# Patient Record
Sex: Female | Born: 1950 | Race: White | Hispanic: No | Marital: Married | Smoking: Never smoker
Health system: Southern US, Community
[De-identification: ages and names within clinical notes are randomized; demographics above are authoritative.]

## PROBLEM LIST (undated history)

## (undated) DIAGNOSIS — M1990 Unspecified osteoarthritis, unspecified site: Secondary | ICD-10-CM

## (undated) DIAGNOSIS — E079 Disorder of thyroid, unspecified: Secondary | ICD-10-CM

## (undated) DIAGNOSIS — L409 Psoriasis, unspecified: Secondary | ICD-10-CM

## (undated) DIAGNOSIS — Z5189 Encounter for other specified aftercare: Secondary | ICD-10-CM

## (undated) DIAGNOSIS — K589 Irritable bowel syndrome without diarrhea: Secondary | ICD-10-CM

## (undated) DIAGNOSIS — IMO0002 Reserved for concepts with insufficient information to code with codable children: Secondary | ICD-10-CM

## (undated) DIAGNOSIS — Q249 Congenital malformation of heart, unspecified: Secondary | ICD-10-CM

## (undated) DIAGNOSIS — M8580 Other specified disorders of bone density and structure, unspecified site: Secondary | ICD-10-CM

## (undated) DIAGNOSIS — K5792 Diverticulitis of intestine, part unspecified, without perforation or abscess without bleeding: Secondary | ICD-10-CM

## (undated) DIAGNOSIS — D219 Benign neoplasm of connective and other soft tissue, unspecified: Secondary | ICD-10-CM

## (undated) DIAGNOSIS — G4733 Obstructive sleep apnea (adult) (pediatric): Secondary | ICD-10-CM

## (undated) DIAGNOSIS — L900 Lichen sclerosus et atrophicus: Secondary | ICD-10-CM

## (undated) DIAGNOSIS — G4730 Sleep apnea, unspecified: Secondary | ICD-10-CM

## (undated) DIAGNOSIS — I10 Essential (primary) hypertension: Secondary | ICD-10-CM

## (undated) DIAGNOSIS — E11319 Type 2 diabetes mellitus with unspecified diabetic retinopathy without macular edema: Secondary | ICD-10-CM

## (undated) DIAGNOSIS — F419 Anxiety disorder, unspecified: Secondary | ICD-10-CM

## (undated) DIAGNOSIS — A64 Unspecified sexually transmitted disease: Secondary | ICD-10-CM

## (undated) DIAGNOSIS — K219 Gastro-esophageal reflux disease without esophagitis: Secondary | ICD-10-CM

## (undated) DIAGNOSIS — I519 Heart disease, unspecified: Secondary | ICD-10-CM

## (undated) DIAGNOSIS — T7840XA Allergy, unspecified, initial encounter: Secondary | ICD-10-CM

## (undated) DIAGNOSIS — F32A Depression, unspecified: Secondary | ICD-10-CM

## (undated) DIAGNOSIS — E785 Hyperlipidemia, unspecified: Secondary | ICD-10-CM

## (undated) DIAGNOSIS — F329 Major depressive disorder, single episode, unspecified: Secondary | ICD-10-CM

## (undated) HISTORY — DX: Congenital malformation of heart, unspecified: Q24.9

## (undated) HISTORY — PX: OOPHORECTOMY: SHX86

## (undated) HISTORY — DX: Obstructive sleep apnea (adult) (pediatric): G47.33

## (undated) HISTORY — DX: Irritable bowel syndrome, unspecified: K58.9

## (undated) HISTORY — PX: PARTIAL COLECTOMY: SHX5273

## (undated) HISTORY — DX: Benign neoplasm of connective and other soft tissue, unspecified: D21.9

## (undated) HISTORY — DX: Anxiety disorder, unspecified: F41.9

## (undated) HISTORY — DX: Allergy, unspecified, initial encounter: T78.40XA

## (undated) HISTORY — PX: ROTATOR CUFF REPAIR: SHX139

## (undated) HISTORY — DX: Major depressive disorder, single episode, unspecified: F32.9

## (undated) HISTORY — PX: TOTAL KNEE ARTHROPLASTY: SHX125

## (undated) HISTORY — DX: Hyperlipidemia, unspecified: E78.5

## (undated) HISTORY — DX: Psoriasis, unspecified: L40.9

## (undated) HISTORY — PX: FOOT SURGERY: SHX648

## (undated) HISTORY — DX: Other specified disorders of bone density and structure, unspecified site: M85.80

## (undated) HISTORY — DX: Disorder of thyroid, unspecified: E07.9

## (undated) HISTORY — PX: NASAL SINUS SURGERY: SHX719

## (undated) HISTORY — DX: Heart disease, unspecified: I51.9

## (undated) HISTORY — DX: Reserved for concepts with insufficient information to code with codable children: IMO0002

## (undated) HISTORY — DX: Unspecified sexually transmitted disease: A64

## (undated) HISTORY — DX: Type 2 diabetes mellitus with unspecified diabetic retinopathy without macular edema: E11.319

## (undated) HISTORY — DX: Lichen sclerosus et atrophicus: L90.0

## (undated) HISTORY — DX: Sleep apnea, unspecified: G47.30

## (undated) HISTORY — DX: Essential (primary) hypertension: I10

## (undated) HISTORY — DX: Unspecified osteoarthritis, unspecified site: M19.90

## (undated) HISTORY — DX: Depression, unspecified: F32.A

## (undated) HISTORY — DX: Diverticulitis of intestine, part unspecified, without perforation or abscess without bleeding: K57.92

## (undated) HISTORY — DX: Gastro-esophageal reflux disease without esophagitis: K21.9

## (undated) HISTORY — DX: Encounter for other specified aftercare: Z51.89

---

## 1985-10-17 ENCOUNTER — Encounter (INDEPENDENT_AMBULATORY_CARE_PROVIDER_SITE_OTHER): Payer: Self-pay | Admitting: Gastroenterology

## 1993-12-31 HISTORY — PX: ABDOMINAL HYSTERECTOMY: SHX81

## 1999-02-05 ENCOUNTER — Ambulatory Visit (HOSPITAL_COMMUNITY): Admission: RE | Admit: 1999-02-05 | Discharge: 1999-02-05 | Payer: Self-pay | Admitting: Neonatology

## 1999-08-28 ENCOUNTER — Other Ambulatory Visit: Admission: RE | Admit: 1999-08-28 | Discharge: 1999-08-28 | Payer: Self-pay | Admitting: Gynecology

## 1999-09-28 ENCOUNTER — Ambulatory Visit (HOSPITAL_BASED_OUTPATIENT_CLINIC_OR_DEPARTMENT_OTHER): Admission: RE | Admit: 1999-09-28 | Discharge: 1999-09-28 | Payer: Self-pay | Admitting: Orthopedic Surgery

## 1999-11-01 ENCOUNTER — Ambulatory Visit (HOSPITAL_BASED_OUTPATIENT_CLINIC_OR_DEPARTMENT_OTHER): Admission: RE | Admit: 1999-11-01 | Discharge: 1999-11-01 | Payer: Self-pay | Admitting: Orthopedic Surgery

## 1999-11-08 ENCOUNTER — Ambulatory Visit (HOSPITAL_COMMUNITY): Admission: RE | Admit: 1999-11-08 | Discharge: 1999-11-08 | Payer: Self-pay | Admitting: Family Medicine

## 1999-12-01 ENCOUNTER — Ambulatory Visit (HOSPITAL_COMMUNITY): Admission: RE | Admit: 1999-12-01 | Discharge: 1999-12-01 | Payer: Self-pay | Admitting: Orthopedic Surgery

## 2000-08-29 ENCOUNTER — Other Ambulatory Visit: Admission: RE | Admit: 2000-08-29 | Discharge: 2000-08-29 | Payer: Self-pay | Admitting: Gynecology

## 2000-12-31 DIAGNOSIS — Z5189 Encounter for other specified aftercare: Secondary | ICD-10-CM

## 2000-12-31 HISTORY — DX: Encounter for other specified aftercare: Z51.89

## 2001-03-21 ENCOUNTER — Encounter: Payer: Self-pay | Admitting: Internal Medicine

## 2001-04-21 ENCOUNTER — Other Ambulatory Visit (HOSPITAL_COMMUNITY): Admission: RE | Admit: 2001-04-21 | Discharge: 2001-04-30 | Payer: Self-pay | Admitting: Psychiatry

## 2001-04-30 ENCOUNTER — Inpatient Hospital Stay (HOSPITAL_COMMUNITY): Admission: EM | Admit: 2001-04-30 | Discharge: 2001-05-01 | Payer: Self-pay | Admitting: *Deleted

## 2001-08-06 ENCOUNTER — Ambulatory Visit (HOSPITAL_COMMUNITY): Admission: RE | Admit: 2001-08-06 | Discharge: 2001-08-06 | Payer: Self-pay | Admitting: Psychiatry

## 2001-08-19 ENCOUNTER — Encounter: Payer: Self-pay | Admitting: Orthopedic Surgery

## 2001-08-25 ENCOUNTER — Inpatient Hospital Stay (HOSPITAL_COMMUNITY): Admission: RE | Admit: 2001-08-25 | Discharge: 2001-08-30 | Payer: Self-pay | Admitting: Orthopedic Surgery

## 2001-08-27 ENCOUNTER — Encounter: Payer: Self-pay | Admitting: Orthopedic Surgery

## 2001-10-24 ENCOUNTER — Encounter: Admission: RE | Admit: 2001-10-24 | Discharge: 2001-10-24 | Payer: Self-pay | Admitting: Psychiatry

## 2001-11-14 ENCOUNTER — Encounter: Admission: RE | Admit: 2001-11-14 | Discharge: 2001-11-14 | Payer: Self-pay | Admitting: Psychiatry

## 2001-11-25 ENCOUNTER — Encounter: Admission: RE | Admit: 2001-11-25 | Discharge: 2001-11-25 | Payer: Self-pay | Admitting: Psychiatry

## 2001-12-12 ENCOUNTER — Encounter: Admission: RE | Admit: 2001-12-12 | Discharge: 2001-12-12 | Payer: Self-pay | Admitting: Psychiatry

## 2001-12-18 ENCOUNTER — Encounter: Admission: RE | Admit: 2001-12-18 | Discharge: 2001-12-18 | Payer: Self-pay | Admitting: Psychiatry

## 2002-01-02 ENCOUNTER — Encounter: Admission: RE | Admit: 2002-01-02 | Discharge: 2002-01-02 | Payer: Self-pay | Admitting: Psychiatry

## 2002-01-15 ENCOUNTER — Encounter: Admission: RE | Admit: 2002-01-15 | Discharge: 2002-01-15 | Payer: Self-pay | Admitting: Psychiatry

## 2002-01-30 ENCOUNTER — Encounter: Admission: RE | Admit: 2002-01-30 | Discharge: 2002-01-30 | Payer: Self-pay | Admitting: Psychiatry

## 2002-02-09 ENCOUNTER — Encounter: Admission: RE | Admit: 2002-02-09 | Discharge: 2002-02-09 | Payer: Self-pay | Admitting: Psychiatry

## 2002-02-12 ENCOUNTER — Ambulatory Visit (HOSPITAL_BASED_OUTPATIENT_CLINIC_OR_DEPARTMENT_OTHER): Admission: RE | Admit: 2002-02-12 | Discharge: 2002-02-12 | Payer: Self-pay | Admitting: Orthopedic Surgery

## 2002-03-30 ENCOUNTER — Encounter: Payer: Self-pay | Admitting: Internal Medicine

## 2002-04-03 ENCOUNTER — Encounter: Payer: Self-pay | Admitting: Internal Medicine

## 2002-04-07 ENCOUNTER — Encounter: Admission: RE | Admit: 2002-04-07 | Discharge: 2002-04-07 | Payer: Self-pay | Admitting: Psychiatry

## 2002-04-14 ENCOUNTER — Encounter: Admission: RE | Admit: 2002-04-14 | Discharge: 2002-04-14 | Payer: Self-pay | Admitting: Psychiatry

## 2002-04-24 ENCOUNTER — Encounter: Admission: RE | Admit: 2002-04-24 | Discharge: 2002-04-24 | Payer: Self-pay | Admitting: Psychiatry

## 2002-04-30 ENCOUNTER — Encounter: Admission: RE | Admit: 2002-04-30 | Discharge: 2002-04-30 | Payer: Self-pay | Admitting: Psychiatry

## 2002-05-04 ENCOUNTER — Other Ambulatory Visit: Admission: RE | Admit: 2002-05-04 | Discharge: 2002-05-04 | Payer: Self-pay | Admitting: Gynecology

## 2002-05-05 ENCOUNTER — Encounter: Payer: Self-pay | Admitting: Gynecology

## 2002-05-05 ENCOUNTER — Encounter: Admission: RE | Admit: 2002-05-05 | Discharge: 2002-05-05 | Payer: Self-pay | Admitting: Gynecology

## 2002-05-07 ENCOUNTER — Encounter: Admission: RE | Admit: 2002-05-07 | Discharge: 2002-05-07 | Payer: Self-pay | Admitting: Psychiatry

## 2002-05-08 ENCOUNTER — Ambulatory Visit (HOSPITAL_COMMUNITY): Admission: RE | Admit: 2002-05-08 | Discharge: 2002-05-08 | Payer: Self-pay | Admitting: Orthopedic Surgery

## 2002-05-08 ENCOUNTER — Encounter: Payer: Self-pay | Admitting: Orthopedic Surgery

## 2002-05-14 ENCOUNTER — Encounter: Admission: RE | Admit: 2002-05-14 | Discharge: 2002-05-14 | Payer: Self-pay | Admitting: Psychiatry

## 2002-05-21 ENCOUNTER — Encounter: Admission: RE | Admit: 2002-05-21 | Discharge: 2002-05-21 | Payer: Self-pay | Admitting: Psychiatry

## 2002-05-28 ENCOUNTER — Encounter: Admission: RE | Admit: 2002-05-28 | Discharge: 2002-05-28 | Payer: Self-pay | Admitting: Psychiatry

## 2002-06-11 ENCOUNTER — Encounter: Admission: RE | Admit: 2002-06-11 | Discharge: 2002-06-11 | Payer: Self-pay | Admitting: Psychiatry

## 2002-06-18 ENCOUNTER — Encounter: Admission: RE | Admit: 2002-06-18 | Discharge: 2002-06-18 | Payer: Self-pay | Admitting: Psychiatry

## 2002-07-02 ENCOUNTER — Encounter: Admission: RE | Admit: 2002-07-02 | Discharge: 2002-07-02 | Payer: Self-pay | Admitting: Psychiatry

## 2002-07-16 ENCOUNTER — Encounter: Admission: RE | Admit: 2002-07-16 | Discharge: 2002-07-16 | Payer: Self-pay | Admitting: Psychiatry

## 2002-07-23 ENCOUNTER — Encounter: Admission: RE | Admit: 2002-07-23 | Discharge: 2002-07-23 | Payer: Self-pay | Admitting: Psychiatry

## 2002-07-30 ENCOUNTER — Encounter: Admission: RE | Admit: 2002-07-30 | Discharge: 2002-07-30 | Payer: Self-pay | Admitting: Psychiatry

## 2002-08-13 ENCOUNTER — Encounter: Admission: RE | Admit: 2002-08-13 | Discharge: 2002-08-13 | Payer: Self-pay | Admitting: Psychiatry

## 2002-09-28 ENCOUNTER — Encounter: Admission: RE | Admit: 2002-09-28 | Discharge: 2002-09-28 | Payer: Self-pay | Admitting: Psychiatry

## 2002-10-12 ENCOUNTER — Encounter: Admission: RE | Admit: 2002-10-12 | Discharge: 2002-10-12 | Payer: Self-pay | Admitting: Psychiatry

## 2002-11-06 ENCOUNTER — Encounter: Admission: RE | Admit: 2002-11-06 | Discharge: 2002-11-06 | Payer: Self-pay | Admitting: Psychiatry

## 2002-11-18 ENCOUNTER — Encounter: Admission: RE | Admit: 2002-11-18 | Discharge: 2002-11-18 | Payer: Self-pay | Admitting: Psychiatry

## 2003-04-02 ENCOUNTER — Encounter: Admission: RE | Admit: 2003-04-02 | Discharge: 2003-04-02 | Payer: Self-pay | Admitting: Psychiatry

## 2003-04-07 ENCOUNTER — Encounter: Admission: RE | Admit: 2003-04-07 | Discharge: 2003-04-07 | Payer: Self-pay | Admitting: Psychiatry

## 2003-04-14 ENCOUNTER — Encounter: Admission: RE | Admit: 2003-04-14 | Discharge: 2003-04-14 | Payer: Self-pay | Admitting: Psychiatry

## 2003-04-20 ENCOUNTER — Encounter: Admission: RE | Admit: 2003-04-20 | Discharge: 2003-04-20 | Payer: Self-pay | Admitting: Psychiatry

## 2003-04-30 ENCOUNTER — Encounter: Admission: RE | Admit: 2003-04-30 | Discharge: 2003-04-30 | Payer: Self-pay | Admitting: Psychiatry

## 2003-05-12 ENCOUNTER — Other Ambulatory Visit: Admission: RE | Admit: 2003-05-12 | Discharge: 2003-05-12 | Payer: Self-pay | Admitting: Gynecology

## 2003-05-17 ENCOUNTER — Encounter: Admission: RE | Admit: 2003-05-17 | Discharge: 2003-05-17 | Payer: Self-pay | Admitting: Psychiatry

## 2003-05-20 ENCOUNTER — Encounter: Admission: RE | Admit: 2003-05-20 | Discharge: 2003-08-18 | Payer: Self-pay | Admitting: Internal Medicine

## 2003-05-26 ENCOUNTER — Encounter: Payer: Self-pay | Admitting: Orthopedic Surgery

## 2003-05-28 ENCOUNTER — Encounter: Payer: Self-pay | Admitting: Orthopedic Surgery

## 2003-05-28 ENCOUNTER — Inpatient Hospital Stay (HOSPITAL_COMMUNITY): Admission: RE | Admit: 2003-05-28 | Discharge: 2003-06-01 | Payer: Self-pay | Admitting: Orthopedic Surgery

## 2003-08-19 ENCOUNTER — Encounter: Admission: RE | Admit: 2003-08-19 | Discharge: 2003-08-19 | Payer: Self-pay | Admitting: Psychiatry

## 2003-08-24 ENCOUNTER — Encounter: Admission: RE | Admit: 2003-08-24 | Discharge: 2003-11-22 | Payer: Self-pay | Admitting: Internal Medicine

## 2004-04-14 ENCOUNTER — Encounter: Admission: RE | Admit: 2004-04-14 | Discharge: 2004-04-14 | Payer: Self-pay | Admitting: Psychiatry

## 2004-05-16 ENCOUNTER — Other Ambulatory Visit (HOSPITAL_COMMUNITY): Admission: RE | Admit: 2004-05-16 | Discharge: 2004-05-18 | Payer: Self-pay | Admitting: Psychiatry

## 2004-05-19 ENCOUNTER — Encounter: Admission: RE | Admit: 2004-05-19 | Discharge: 2004-05-19 | Payer: Self-pay | Admitting: Gastroenterology

## 2004-10-04 ENCOUNTER — Other Ambulatory Visit: Admission: RE | Admit: 2004-10-04 | Discharge: 2004-10-04 | Payer: Self-pay | Admitting: Gynecology

## 2004-11-16 ENCOUNTER — Ambulatory Visit (HOSPITAL_COMMUNITY): Payer: Self-pay | Admitting: Professional Counselor

## 2004-11-30 ENCOUNTER — Ambulatory Visit (HOSPITAL_COMMUNITY): Payer: Self-pay | Admitting: Professional Counselor

## 2005-04-10 ENCOUNTER — Ambulatory Visit: Payer: Self-pay | Admitting: Internal Medicine

## 2005-11-08 ENCOUNTER — Encounter: Admission: RE | Admit: 2005-11-08 | Discharge: 2005-11-08 | Payer: Self-pay | Admitting: Internal Medicine

## 2005-11-09 ENCOUNTER — Ambulatory Visit: Payer: Self-pay | Admitting: Internal Medicine

## 2005-11-12 ENCOUNTER — Other Ambulatory Visit: Admission: RE | Admit: 2005-11-12 | Discharge: 2005-11-12 | Payer: Self-pay | Admitting: Gynecology

## 2005-11-19 ENCOUNTER — Ambulatory Visit: Payer: Self-pay | Admitting: Internal Medicine

## 2005-11-20 ENCOUNTER — Encounter: Admission: RE | Admit: 2005-11-20 | Discharge: 2005-12-30 | Payer: Self-pay | Admitting: Internal Medicine

## 2005-11-29 ENCOUNTER — Encounter (INDEPENDENT_AMBULATORY_CARE_PROVIDER_SITE_OTHER): Payer: Self-pay | Admitting: Specialist

## 2005-11-29 ENCOUNTER — Encounter: Payer: Self-pay | Admitting: Internal Medicine

## 2005-11-29 ENCOUNTER — Ambulatory Visit: Payer: Self-pay | Admitting: Internal Medicine

## 2005-11-29 DIAGNOSIS — D126 Benign neoplasm of colon, unspecified: Secondary | ICD-10-CM | POA: Insufficient documentation

## 2005-12-21 ENCOUNTER — Ambulatory Visit: Payer: Self-pay | Admitting: Internal Medicine

## 2006-02-07 ENCOUNTER — Ambulatory Visit: Payer: Self-pay | Admitting: Internal Medicine

## 2006-02-25 ENCOUNTER — Ambulatory Visit: Payer: Self-pay | Admitting: Internal Medicine

## 2006-08-28 ENCOUNTER — Ambulatory Visit: Payer: Self-pay | Admitting: Internal Medicine

## 2006-09-12 ENCOUNTER — Ambulatory Visit: Payer: Self-pay | Admitting: Internal Medicine

## 2006-09-18 ENCOUNTER — Ambulatory Visit: Payer: Self-pay | Admitting: Internal Medicine

## 2006-10-16 ENCOUNTER — Ambulatory Visit: Payer: Self-pay | Admitting: *Deleted

## 2006-10-16 ENCOUNTER — Observation Stay (HOSPITAL_COMMUNITY): Admission: EM | Admit: 2006-10-16 | Discharge: 2006-10-17 | Payer: Self-pay | Admitting: Emergency Medicine

## 2006-10-31 ENCOUNTER — Ambulatory Visit: Payer: Self-pay | Admitting: Internal Medicine

## 2006-10-31 DIAGNOSIS — E785 Hyperlipidemia, unspecified: Secondary | ICD-10-CM | POA: Insufficient documentation

## 2006-10-31 DIAGNOSIS — F411 Generalized anxiety disorder: Secondary | ICD-10-CM | POA: Insufficient documentation

## 2006-10-31 DIAGNOSIS — F329 Major depressive disorder, single episode, unspecified: Secondary | ICD-10-CM

## 2006-10-31 DIAGNOSIS — I10 Essential (primary) hypertension: Secondary | ICD-10-CM | POA: Insufficient documentation

## 2006-10-31 DIAGNOSIS — J309 Allergic rhinitis, unspecified: Secondary | ICD-10-CM | POA: Insufficient documentation

## 2006-10-31 DIAGNOSIS — F32A Depression, unspecified: Secondary | ICD-10-CM | POA: Insufficient documentation

## 2006-10-31 DIAGNOSIS — K5730 Diverticulosis of large intestine without perforation or abscess without bleeding: Secondary | ICD-10-CM | POA: Insufficient documentation

## 2006-10-31 DIAGNOSIS — J45909 Unspecified asthma, uncomplicated: Secondary | ICD-10-CM | POA: Insufficient documentation

## 2006-11-14 ENCOUNTER — Ambulatory Visit: Payer: Self-pay | Admitting: Pulmonary Disease

## 2006-12-10 ENCOUNTER — Ambulatory Visit (HOSPITAL_BASED_OUTPATIENT_CLINIC_OR_DEPARTMENT_OTHER): Admission: RE | Admit: 2006-12-10 | Discharge: 2006-12-10 | Payer: Self-pay | Admitting: Pulmonary Disease

## 2006-12-10 ENCOUNTER — Ambulatory Visit: Payer: Self-pay | Admitting: Pulmonary Disease

## 2006-12-13 ENCOUNTER — Ambulatory Visit: Payer: Self-pay | Admitting: Pulmonary Disease

## 2006-12-16 ENCOUNTER — Ambulatory Visit: Payer: Self-pay | Admitting: Pulmonary Disease

## 2007-01-08 ENCOUNTER — Other Ambulatory Visit: Admission: RE | Admit: 2007-01-08 | Discharge: 2007-01-08 | Payer: Self-pay | Admitting: Gynecology

## 2007-01-24 ENCOUNTER — Ambulatory Visit: Payer: Self-pay | Admitting: Pulmonary Disease

## 2007-08-26 DIAGNOSIS — M1990 Unspecified osteoarthritis, unspecified site: Secondary | ICD-10-CM | POA: Insufficient documentation

## 2007-08-26 DIAGNOSIS — E039 Hypothyroidism, unspecified: Secondary | ICD-10-CM | POA: Insufficient documentation

## 2007-09-12 ENCOUNTER — Ambulatory Visit: Payer: Self-pay | Admitting: Internal Medicine

## 2007-09-12 LAB — CONVERTED CEMR LAB
ALT: 28 units/L (ref 0–35)
AST: 29 units/L (ref 0–37)
Albumin: 4.4 g/dL (ref 3.5–5.2)
Alkaline Phosphatase: 89 units/L (ref 39–117)
BUN: 20 mg/dL (ref 6–23)
Basophils Absolute: 0.1 10*3/uL (ref 0.0–0.1)
Basophils Relative: 0.5 % (ref 0.0–1.0)
Bilirubin Urine: NEGATIVE
Bilirubin, Direct: 0.2 mg/dL (ref 0.0–0.3)
Blood in Urine, dipstick: NEGATIVE
CO2: 30 meq/L (ref 19–32)
Calcium: 9.4 mg/dL (ref 8.4–10.5)
Chloride: 104 meq/L (ref 96–112)
Cholesterol: 174 mg/dL (ref 0–200)
Creatinine, Ser: 1.2 mg/dL (ref 0.4–1.2)
Creatinine,U: 118.1 mg/dL
Direct LDL: 107.9 mg/dL
Eosinophils Absolute: 0.4 10*3/uL (ref 0.0–0.6)
Eosinophils Relative: 4 % (ref 0.0–5.0)
GFR calc Af Amer: 60 mL/min
GFR calc non Af Amer: 50 mL/min
Glucose, Bld: 123 mg/dL — ABNORMAL HIGH (ref 70–99)
Glucose, Urine, Semiquant: NEGATIVE
HCT: 38.2 % (ref 36.0–46.0)
HDL: 41.9 mg/dL (ref >39.0)
Hemoglobin: 13.1 g/dL (ref 12.0–15.0)
Hgb A1c MFr Bld: 6.9 % — ABNORMAL HIGH (ref 4.6–6.0)
Ketones, urine, test strip: NEGATIVE
Lymphocytes Relative: 40 % (ref 12.0–46.0)
MCHC: 34.4 g/dL (ref 30.0–36.0)
MCV: 90.1 fL (ref 78.0–100.0)
Microalb Creat Ratio: 6.8 mg/g (ref 0.0–30.0)
Microalb, Ur: 0.8 mg/dL (ref 0.0–1.9)
Monocytes Absolute: 0.8 10*3/uL — ABNORMAL HIGH (ref 0.2–0.7)
Monocytes Relative: 7.9 % (ref 3.0–11.0)
Neutro Abs: 4.9 10*3/uL (ref 1.4–7.7)
Neutrophils Relative %: 47.6 % (ref 43.0–77.0)
Nitrite: POSITIVE
Platelets: 319 10*3/uL (ref 150–400)
Potassium: 5.5 meq/L — ABNORMAL HIGH (ref 3.5–5.1)
Protein, U semiquant: NEGATIVE
RBC: 4.24 M/uL (ref 3.87–5.11)
RDW: 14.1 % (ref 11.5–14.6)
Sodium: 143 meq/L (ref 135–145)
Specific Gravity, Urine: 1.02
TSH: 4.32 microintl units/mL (ref 0.35–5.50)
Total Bilirubin: 0.4 mg/dL (ref 0.3–1.2)
Total CHOL/HDL Ratio: 4.2
Total Protein: 6.7 g/dL (ref 6.0–8.3)
Triglycerides: 209 mg/dL (ref 0–149)
Urobilinogen, UA: 0.2
VLDL: 42 mg/dL — ABNORMAL HIGH (ref 0–40)
WBC Urine, dipstick: NEGATIVE
WBC: 10.4 10*3/uL (ref 4.5–10.5)
pH: 5

## 2007-09-19 ENCOUNTER — Ambulatory Visit: Payer: Self-pay | Admitting: Internal Medicine

## 2007-09-19 DIAGNOSIS — G4730 Sleep apnea, unspecified: Secondary | ICD-10-CM | POA: Insufficient documentation

## 2007-09-19 DIAGNOSIS — F3189 Other bipolar disorder: Secondary | ICD-10-CM | POA: Insufficient documentation

## 2007-10-13 ENCOUNTER — Ambulatory Visit: Payer: Self-pay | Admitting: Internal Medicine

## 2007-10-13 LAB — CONVERTED CEMR LAB: Tissue Transglutaminase Ab, IgA: 0.1 units (ref <7)

## 2007-10-22 ENCOUNTER — Ambulatory Visit: Payer: Self-pay | Admitting: Pulmonary Disease

## 2007-12-08 ENCOUNTER — Ambulatory Visit: Payer: Self-pay | Admitting: Internal Medicine

## 2008-01-01 DIAGNOSIS — M8580 Other specified disorders of bone density and structure, unspecified site: Secondary | ICD-10-CM

## 2008-01-01 HISTORY — DX: Other specified disorders of bone density and structure, unspecified site: M85.80

## 2008-01-06 ENCOUNTER — Ambulatory Visit: Payer: Self-pay | Admitting: Internal Medicine

## 2008-01-06 LAB — CONVERTED CEMR LAB
Glucose, Urine, Semiquant: NEGATIVE
Ketones, urine, test strip: NEGATIVE
Nitrite: NEGATIVE
Specific Gravity, Urine: 1.02
Urobilinogen, UA: 0.2
pH: 5

## 2008-01-07 ENCOUNTER — Encounter: Payer: Self-pay | Admitting: Internal Medicine

## 2008-01-09 ENCOUNTER — Ambulatory Visit: Payer: Self-pay | Admitting: Internal Medicine

## 2008-01-09 LAB — CONVERTED CEMR LAB
ALT: 18 units/L (ref 0–35)
AST: 21 units/L (ref 0–37)
Albumin: 4.4 g/dL (ref 3.5–5.2)
Alkaline Phosphatase: 83 units/L (ref 39–117)
BUN: 22 mg/dL (ref 6–23)
Bilirubin, Direct: 0.1 mg/dL (ref 0.0–0.3)
CO2: 28 meq/L (ref 19–32)
Calcium: 9.6 mg/dL (ref 8.4–10.5)
Chloride: 98 meq/L (ref 96–112)
Cholesterol: 158 mg/dL (ref 0–200)
Creatinine, Ser: 1.3 mg/dL — ABNORMAL HIGH (ref 0.4–1.2)
Direct LDL: 92.9 mg/dL
GFR calc Af Amer: 55 mL/min
GFR calc non Af Amer: 45 mL/min
Glucose, Bld: 183 mg/dL — ABNORMAL HIGH (ref 70–99)
HDL: 34.4 mg/dL — ABNORMAL LOW (ref >39.0)
Hgb A1c MFr Bld: 6.5 % — ABNORMAL HIGH (ref 4.6–6.0)
Potassium: 5.1 meq/L (ref 3.5–5.1)
Sodium: 137 meq/L (ref 135–145)
Total Bilirubin: 0.6 mg/dL (ref 0.3–1.2)
Total CHOL/HDL Ratio: 4.6
Total Protein: 6.6 g/dL (ref 6.0–8.3)
Triglycerides: 246 mg/dL (ref 0–149)
VLDL: 49 mg/dL — ABNORMAL HIGH (ref 0–40)

## 2008-01-16 ENCOUNTER — Ambulatory Visit: Payer: Self-pay | Admitting: Internal Medicine

## 2008-02-27 ENCOUNTER — Ambulatory Visit: Payer: Self-pay | Admitting: Internal Medicine

## 2008-03-05 DIAGNOSIS — K589 Irritable bowel syndrome without diarrhea: Secondary | ICD-10-CM | POA: Insufficient documentation

## 2008-03-29 ENCOUNTER — Telehealth: Payer: Self-pay | Admitting: Internal Medicine

## 2008-07-01 ENCOUNTER — Ambulatory Visit: Payer: Self-pay | Admitting: Internal Medicine

## 2008-07-01 LAB — CONVERTED CEMR LAB
Cholesterol: 224 mg/dL (ref 0–200)
Direct LDL: 136.3 mg/dL
Glucose, Bld: 224 mg/dL — ABNORMAL HIGH (ref 70–99)
HDL: 40.9 mg/dL (ref >39.0)
Hgb A1c MFr Bld: 8 % — ABNORMAL HIGH (ref 4.6–6.0)
Total CHOL/HDL Ratio: 5.5
Triglycerides: 322 mg/dL (ref 0–149)
VLDL: 64 mg/dL — ABNORMAL HIGH (ref 0–40)

## 2008-07-09 ENCOUNTER — Ambulatory Visit: Payer: Self-pay | Admitting: Internal Medicine

## 2008-08-12 ENCOUNTER — Ambulatory Visit: Payer: Self-pay | Admitting: Cardiovascular Disease

## 2008-08-20 ENCOUNTER — Telehealth: Payer: Self-pay | Admitting: Internal Medicine

## 2008-09-03 ENCOUNTER — Encounter: Payer: Self-pay | Admitting: Internal Medicine

## 2008-09-03 ENCOUNTER — Ambulatory Visit: Payer: Self-pay

## 2008-09-03 ENCOUNTER — Encounter: Payer: Self-pay | Admitting: Cardiovascular Disease

## 2008-10-07 ENCOUNTER — Encounter: Payer: Self-pay | Admitting: Internal Medicine

## 2008-10-13 ENCOUNTER — Encounter: Admission: RE | Admit: 2008-10-13 | Discharge: 2008-10-13 | Payer: Self-pay | Admitting: Internal Medicine

## 2008-10-13 LAB — HM MAMMOGRAPHY

## 2008-11-15 ENCOUNTER — Ambulatory Visit: Payer: Self-pay | Admitting: Internal Medicine

## 2008-11-15 LAB — CONVERTED CEMR LAB
ALT: 25 units/L (ref 0–35)
AST: 31 units/L (ref 0–37)
Albumin: 4.5 g/dL (ref 3.5–5.2)
Alkaline Phosphatase: 103 units/L (ref 39–117)
BUN: 16 mg/dL (ref 6–23)
Bilirubin, Direct: 0.1 mg/dL (ref 0.0–0.3)
CO2: 29 meq/L (ref 19–32)
Calcium: 9.8 mg/dL (ref 8.4–10.5)
Chloride: 96 meq/L (ref 96–112)
Cholesterol: 194 mg/dL (ref 0–200)
Creatinine, Ser: 1.1 mg/dL (ref 0.4–1.2)
Direct LDL: 113.3 mg/dL
GFR calc Af Amer: 66 mL/min
GFR calc non Af Amer: 55 mL/min
Glucose, Bld: 190 mg/dL — ABNORMAL HIGH (ref 70–99)
HDL: 46.6 mg/dL (ref >39.0)
Hgb A1c MFr Bld: 7.9 % — ABNORMAL HIGH (ref 4.6–6.0)
Potassium: 4 meq/L (ref 3.5–5.1)
Sodium: 136 meq/L (ref 135–145)
Total Bilirubin: 0.5 mg/dL (ref 0.3–1.2)
Total CHOL/HDL Ratio: 4.2
Total Protein: 7.4 g/dL (ref 6.0–8.3)
Triglycerides: 217 mg/dL (ref 0–149)
VLDL: 43 mg/dL — ABNORMAL HIGH (ref 0–40)

## 2008-11-23 ENCOUNTER — Ambulatory Visit: Payer: Self-pay | Admitting: Internal Medicine

## 2008-12-29 ENCOUNTER — Telehealth: Payer: Self-pay | Admitting: *Deleted

## 2009-01-19 ENCOUNTER — Encounter: Payer: Self-pay | Admitting: Internal Medicine

## 2009-01-19 ENCOUNTER — Ambulatory Visit: Payer: Self-pay | Admitting: Internal Medicine

## 2009-02-01 ENCOUNTER — Encounter: Payer: Self-pay | Admitting: Internal Medicine

## 2009-03-18 ENCOUNTER — Ambulatory Visit: Payer: Self-pay | Admitting: Internal Medicine

## 2009-03-18 LAB — CONVERTED CEMR LAB
ALT: 19 units/L (ref 0–35)
AST: 25 units/L (ref 0–37)
Albumin: 4.2 g/dL (ref 3.5–5.2)
Alkaline Phosphatase: 97 units/L (ref 39–117)
BUN: 24 mg/dL — ABNORMAL HIGH (ref 6–23)
Bilirubin, Direct: 0 mg/dL (ref 0.0–0.3)
CO2: 31 meq/L (ref 19–32)
Calcium: 9.2 mg/dL (ref 8.4–10.5)
Chloride: 99 meq/L (ref 96–112)
Cholesterol: 150 mg/dL (ref 0–200)
Creatinine, Ser: 1 mg/dL (ref 0.4–1.2)
Direct LDL: 80.2 mg/dL
GFR calc non Af Amer: 60.85 mL/min (ref >60)
Glucose, Bld: 127 mg/dL — ABNORMAL HIGH (ref 70–99)
HDL: 39.4 mg/dL (ref >39.00)
Hgb A1c MFr Bld: 7.2 % — ABNORMAL HIGH (ref 4.6–6.5)
Potassium: 4.3 meq/L (ref 3.5–5.1)
Sodium: 139 meq/L (ref 135–145)
Total Bilirubin: 0.5 mg/dL (ref 0.3–1.2)
Total CHOL/HDL Ratio: 4
Total Protein: 6.9 g/dL (ref 6.0–8.3)
Triglycerides: 247 mg/dL — ABNORMAL HIGH (ref 0.0–149.0)
VLDL: 49.4 mg/dL — ABNORMAL HIGH (ref 0.0–40.0)

## 2009-04-25 ENCOUNTER — Ambulatory Visit: Payer: Self-pay | Admitting: Internal Medicine

## 2009-07-18 ENCOUNTER — Ambulatory Visit: Payer: Self-pay | Admitting: Internal Medicine

## 2009-07-22 ENCOUNTER — Telehealth: Payer: Self-pay | Admitting: Internal Medicine

## 2009-08-15 ENCOUNTER — Ambulatory Visit: Payer: Self-pay | Admitting: Internal Medicine

## 2009-08-15 LAB — CONVERTED CEMR LAB
ALT: 16 units/L (ref 0–35)
AST: 22 units/L (ref 0–37)
Albumin: 4.2 g/dL (ref 3.5–5.2)
Alkaline Phosphatase: 97 units/L (ref 39–117)
BUN: 17 mg/dL (ref 6–23)
Bilirubin, Direct: 0 mg/dL (ref 0.0–0.3)
CO2: 31 meq/L (ref 19–32)
Calcium: 9.3 mg/dL (ref 8.4–10.5)
Chloride: 103 meq/L (ref 96–112)
Cholesterol: 144 mg/dL (ref 0–200)
Creatinine, Ser: 1.2 mg/dL (ref 0.4–1.2)
GFR calc non Af Amer: 49.23 mL/min (ref >60)
Glucose, Bld: 141 mg/dL — ABNORMAL HIGH (ref 70–99)
HDL: 43.9 mg/dL (ref >39.00)
Hgb A1c MFr Bld: 7 % — ABNORMAL HIGH (ref 4.6–6.5)
LDL Cholesterol: 65 mg/dL (ref 0–99)
Potassium: 4.3 meq/L (ref 3.5–5.1)
Sodium: 142 meq/L (ref 135–145)
Total Bilirubin: 0.6 mg/dL (ref 0.3–1.2)
Total CHOL/HDL Ratio: 3
Total Protein: 6.9 g/dL (ref 6.0–8.3)
Triglycerides: 177 mg/dL — ABNORMAL HIGH (ref 0.0–149.0)
VLDL: 35.4 mg/dL (ref 0.0–40.0)

## 2009-09-09 ENCOUNTER — Ambulatory Visit: Payer: Self-pay | Admitting: Internal Medicine

## 2009-10-10 ENCOUNTER — Encounter: Payer: Self-pay | Admitting: Internal Medicine

## 2009-12-27 ENCOUNTER — Ambulatory Visit: Payer: Self-pay | Admitting: Family Medicine

## 2010-01-03 ENCOUNTER — Telehealth: Payer: Self-pay | Admitting: Internal Medicine

## 2010-03-14 ENCOUNTER — Ambulatory Visit: Payer: Self-pay | Admitting: Internal Medicine

## 2010-03-14 LAB — CONVERTED CEMR LAB
ALT: 38 units/L — ABNORMAL HIGH (ref 0–35)
AST: 45 units/L — ABNORMAL HIGH (ref 0–37)
Albumin: 4.4 g/dL (ref 3.5–5.2)
Alkaline Phosphatase: 111 units/L (ref 39–117)
BUN: 17 mg/dL (ref 6–23)
Bilirubin, Direct: 0.1 mg/dL (ref 0.0–0.3)
CO2: 28 meq/L (ref 19–32)
Calcium: 9.2 mg/dL (ref 8.4–10.5)
Chloride: 102 meq/L (ref 96–112)
Cholesterol: 134 mg/dL (ref 0–200)
Creatinine, Ser: 1.1 mg/dL (ref 0.4–1.2)
Direct LDL: 72 mg/dL
GFR calc non Af Amer: 54.32 mL/min (ref >60)
Glucose, Bld: 259 mg/dL — ABNORMAL HIGH (ref 70–99)
HDL: 49.3 mg/dL (ref >39.00)
Hgb A1c MFr Bld: 7.3 % — ABNORMAL HIGH (ref 4.6–6.5)
Potassium: 3.8 meq/L (ref 3.5–5.1)
Sodium: 140 meq/L (ref 135–145)
Total Bilirubin: 0.6 mg/dL (ref 0.3–1.2)
Total CHOL/HDL Ratio: 3
Total Protein: 7.7 g/dL (ref 6.0–8.3)
Triglycerides: 264 mg/dL — ABNORMAL HIGH (ref 0.0–149.0)
VLDL: 52.8 mg/dL — ABNORMAL HIGH (ref 0.0–40.0)

## 2010-03-23 ENCOUNTER — Ambulatory Visit: Payer: Self-pay | Admitting: Internal Medicine

## 2010-07-24 ENCOUNTER — Telehealth (INDEPENDENT_AMBULATORY_CARE_PROVIDER_SITE_OTHER): Payer: Self-pay | Admitting: *Deleted

## 2010-08-30 ENCOUNTER — Ambulatory Visit: Payer: Self-pay | Admitting: Internal Medicine

## 2010-08-30 LAB — CONVERTED CEMR LAB
ALT: 29 units/L (ref 0–35)
AST: 38 units/L — ABNORMAL HIGH (ref 0–37)
Albumin: 4.3 g/dL (ref 3.5–5.2)
Alkaline Phosphatase: 88 units/L (ref 39–117)
BUN: 15 mg/dL (ref 6–23)
Bilirubin, Direct: 0.1 mg/dL (ref 0.0–0.3)
CO2: 30 meq/L (ref 19–32)
Calcium: 9.7 mg/dL (ref 8.4–10.5)
Chloride: 98 meq/L (ref 96–112)
Cholesterol: 163 mg/dL (ref 0–200)
Creatinine, Ser: 1.2 mg/dL (ref 0.4–1.2)
Direct LDL: 93.8 mg/dL
GFR calc non Af Amer: 51.52 mL/min (ref >60)
Glucose, Bld: 187 mg/dL — ABNORMAL HIGH (ref 70–99)
HDL: 43.9 mg/dL (ref >39.00)
Hgb A1c MFr Bld: 9.9 % — ABNORMAL HIGH (ref 4.6–6.5)
Potassium: 4.9 meq/L (ref 3.5–5.1)
Sodium: 139 meq/L (ref 135–145)
Total Bilirubin: 0.5 mg/dL (ref 0.3–1.2)
Total CHOL/HDL Ratio: 4
Total Protein: 6.9 g/dL (ref 6.0–8.3)
Triglycerides: 323 mg/dL — ABNORMAL HIGH (ref 0.0–149.0)
VLDL: 64.6 mg/dL — ABNORMAL HIGH (ref 0.0–40.0)

## 2010-10-13 ENCOUNTER — Ambulatory Visit: Payer: Self-pay | Admitting: Internal Medicine

## 2010-10-13 ENCOUNTER — Telehealth: Payer: Self-pay | Admitting: Internal Medicine

## 2010-11-15 ENCOUNTER — Ambulatory Visit: Payer: Self-pay | Admitting: Internal Medicine

## 2010-12-07 ENCOUNTER — Encounter: Admit: 2010-12-07 | Payer: Self-pay | Admitting: Internal Medicine

## 2010-12-12 ENCOUNTER — Ambulatory Visit: Payer: Self-pay | Admitting: Internal Medicine

## 2010-12-19 ENCOUNTER — Ambulatory Visit: Payer: Self-pay | Admitting: Internal Medicine

## 2010-12-31 LAB — HM PAP SMEAR

## 2011-01-02 ENCOUNTER — Ambulatory Visit: Admit: 2011-01-02 | Payer: Self-pay | Admitting: Internal Medicine

## 2011-01-11 ENCOUNTER — Encounter
Admission: RE | Admit: 2011-01-11 | Discharge: 2011-01-30 | Payer: Self-pay | Source: Home / Self Care | Attending: Internal Medicine | Admitting: Internal Medicine

## 2011-01-11 LAB — CONVERTED CEMR LAB: Hgb A1c MFr Bld: 7.5 %

## 2011-01-12 ENCOUNTER — Other Ambulatory Visit: Payer: Self-pay | Admitting: Internal Medicine

## 2011-01-12 LAB — BASIC METABOLIC PANEL
BUN: 20 mg/dL (ref 6–23)
CO2: 33 mEq/L — ABNORMAL HIGH (ref 19–32)
Calcium: 9.7 mg/dL (ref 8.4–10.5)
Chloride: 99 mEq/L (ref 96–112)
Creatinine, Ser: 1.4 mg/dL — ABNORMAL HIGH (ref 0.4–1.2)
GFR: 39.7 mL/min — ABNORMAL LOW (ref >60.00)
Glucose, Bld: 142 mg/dL — ABNORMAL HIGH (ref 70–99)
Potassium: 4.3 mEq/L (ref 3.5–5.1)
Sodium: 141 mEq/L (ref 135–145)

## 2011-01-12 LAB — HEPATIC FUNCTION PANEL
ALT: 22 U/L (ref 0–35)
AST: 28 U/L (ref 0–37)
Albumin: 4.7 g/dL (ref 3.5–5.2)
Alkaline Phosphatase: 88 U/L (ref 39–117)
Bilirubin, Direct: 0.1 mg/dL (ref 0.0–0.3)
Total Bilirubin: 0.8 mg/dL (ref 0.3–1.2)
Total Protein: 7.4 g/dL (ref 6.0–8.3)

## 2011-01-12 LAB — LIPID PANEL
Cholesterol: 138 mg/dL (ref 0–200)
HDL: 37.5 mg/dL — ABNORMAL LOW (ref >39.00)
LDL Cholesterol: 68 mg/dL (ref 0–99)
Total CHOL/HDL Ratio: 4
Triglycerides: 164 mg/dL — ABNORMAL HIGH (ref 0.0–149.0)
VLDL: 32.8 mg/dL (ref 0.0–40.0)

## 2011-01-12 LAB — TSH: TSH: 4.8 u[IU]/mL (ref 0.35–5.50)

## 2011-01-19 ENCOUNTER — Encounter: Payer: Self-pay | Admitting: Internal Medicine

## 2011-01-21 ENCOUNTER — Encounter: Payer: Self-pay | Admitting: Gynecology

## 2011-01-21 ENCOUNTER — Encounter: Payer: Self-pay | Admitting: Internal Medicine

## 2011-01-22 ENCOUNTER — Encounter: Payer: Self-pay | Admitting: Internal Medicine

## 2011-01-30 NOTE — Assessment & Plan Note (Signed)
Summary: FUP ON LABS//CCM   Vital Signs:  Patient profile:   61 year old female Weight:      238 pounds BMI:     45.88 Temp:     98.2 degrees F oral BP sitting:   110 / 74  (left arm) Cuff size:   large  Vitals Entered By: Kern Reap CMA Duncan Dull) (March 23, 2010 9:42 AM)  Nutrition Counseling: Patient's BMI is greater than 25 and therefore counseled on weight management options.  CC: follow-up visit Is Patient Diabetic? Yes Did you bring your meter with you today? No Pain Assessment Patient in pain? no        CC:  follow-up visit.  History of Present Illness:  Follow-Up Visit      This is a 61 year old woman who presents for Follow-up visit.  The patient denies chest pain and palpitations.  Since the last visit the patient notes no new problems or concerns and being seen by a specialist.  The patient reports taking meds as prescribed (except self d/ tegretol and lamictal---has f/u with psych tomorrow).  When questioned about possible medication side effects, the patient notes none.  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 One By Mouth Daily 2)  Levothroid 50 Mcg Tabs (Levothyroxine Sodium) .... One By Mouth Daily 3)  Lisinopril-Hydrochlorothiazide  20-12.5 Mg Tabs (Lisinopril-Hydrochlorothiazide) .... Take 1 Tablet By Mouth Once A Day 4)  Zyrtec Allergy 10 Mg  Tabs (Cetirizine Hcl) .... As Needed 5)  Lorazepam 1 Mg  Tabs (Lorazepam) .... Five Times A Day As Needed 6)  Lipitor 80 Mg Tabs (Atorvastatin Calcium) .... Take 1 Tab By Mouth Daily 7)  Bayer Aspirin 325 Mg  Tabs (Aspirin) .... Take 1/4 Tablet By Mouth Once A Day 8)  Astelin 137 Mcg/spray  Soln (Azelastine Hcl) .... Take 1 Spray in Each Nostril Two Times A Day 9)  Flonase 50 Mcg/act  Susp (Fluticasone Propionate) .... Take 1 Spray in Each Nostril Once Daily 10)  Lomotil 2.5-0.025 Mg  Tabs (Diphenoxylate-Atropine) .... Use As Directed As Needed 11)  Glyburide 5 Mg  Tabs (Glyburide) .Marland Kitchen.. 1 By Mouth Two Times A Day With Meals 12)  Zantac 75 75 Mg  Tabs (Ranitidine Hcl) .... Take 1 Tablet By Mouth Two Times A Day 13)  Multivitamins  Tabs (Multiple Vitamin) .... Once Daily 14)  Citracal Plus Bone Density  Tabs (Multiple Minerals-Vitamins) .... Two Daily 15)  Proair Hfa 108 (90 Base) Mcg/act Aers (Albuterol Sulfate) .... As Needed 16)  Januvia 100 Mg Tabs (Sitagliptin Phosphate) .... Take 1 Tab By Mouth Daily 17)  Symbicort 160-4.5 Mcg/act Aero (Budesonide-Formoterol Fumarate) .... 2 Inhalations Two Times A Day 18)  Doxycycline Hyclate 100 Mg Tabs (Doxycycline Hyclate) .Marland Kitchen.. 1 Am and Pm 19)  Hydrocodone-Homatropine  5-1.5 Mg/18ml Syrp (Hydrocodone-Homatropine) .Marland Kitchen.. 1-2 Tsp Q4h As Needed Cough 20)  Pristiq 100 Mg Xr24h-Tab (Desvenlafaxine Succinate) .... Take One Tab Once Daily 21)  Benadryl 25 Mg Caps (Diphenhydramine Hcl) .... Take One Tab At Bedtime As Needed Sleep  Allergies (verified): 1)  ! Sulfamethoxazole (Sulfamethoxazole) 2)  ! Morphine Sulfate Cr (Morphine Sulfate) 3)  ! Tylox (Oxycodone-Acetaminophen) 4)  ! Oxycodone Hcl (Oxycodone Hcl) 5)  Codeine Phosphate (Codeine Phosphate) 6)  Erythromycin  Past History:  Past Medical History: Last updated: 09/19/2007 Allergic  rhinitis Anxiety---?bipolar Asthma Depression---followed by psych Evelene Croon) Diabetes mellitus, type II Diverticulosis, colon Hyperlipidemia Hypertension IBS Hypothyroidism Osteoarthritis, bilateral knee (reconstructive surgery) OSA  Past Surgical History: Last updated: 08/26/2007 Colectomy, partial Hysterectomy, fibroids Total knee replacement Oophorectomy  Family History: Last updated: 01/16/2008 Family History Ovarian cancer-grandmother mother---COPD-deceased in 2007/05/30 Father-deceased 71 MI sister MI age 23 Family History of CAD Female 1st degree relative <60 Family History of CAD Female 1st degree relative <50 Family History High cholesterol Family History Hypertension  Social History: Last updated: 09/19/2007 Retired Never Smoked Regular exercise-no  Risk Factors: Exercise: no (09/19/2007)  Risk Factors: Smoking Status: never (09/19/2007)  Review of Systems       All other systems reviewed and were negative   Physical Exam  General:  alert and well-developed.   Head:  atraumatic.   Eyes:  pupils equal and pupils round.   Neck:  No deformities, masses, or tenderness noted. Chest Wall:  No deformities, masses, or tenderness noted. Lungs:  normal respiratory effort and no intercostal retractions.   Heart:  normal rate and regular rhythm.   Abdomen:  Bowel sounds positive,abdomen soft and non-tender without masses, organomegaly or hernias noted.  obese Msk:  No deformity or scoliosis noted of thoracic or lumbar spine.   Pulses:  R radial normal and L radial normal.   Neurologic:  cranial nerves II-XII intact and gait normal.   Skin:  turgor normal and color normal.   Psych:  flat affect and withdrawn.    Diabetes Management Exam:    Eye Exam:       Eye Exam done elsewhere          Date: 02/28/2010          Results: normal-pt's report          Done by: ophthal   Impression & Recommendations:  Problem # 1:  OBESITY, MORBID (ICD-278.01) Assessment  Unchanged this is clearly her most significant physical problem  encouraged weight loss daily exercise  Problem # 2:  HYPOTHYROIDISM (ICD-244.9) need to f/u  TSH Her updated medication list for this problem includes:    Levothroid 50 Mcg Tabs (Levothyroxine sodium) ..... One by mouth daily  Labs Reviewed: TSH: 4.32 (09/12/2007)    HgBA1c: 7.3 (03/14/2010) Chol: 134 (03/14/2010)   HDL: 49.30 (03/14/2010)   LDL: 65 (08/15/2009)   TG: 264.0 (03/14/2010)  Problem # 3:  HYPERTENSION (ICD-401.9) controlled continue current medications  Her updated medication list for this problem includes:    Atenolol 25 Mg Tabs (Atenolol) ..... One by mouth daily    Lisinopril-hydrochlorothiazide 20-12.5 Mg Tabs (Lisinopril-hydrochlorothiazide) .Marland Kitchen... Take 1 tablet by mouth once a day  BP today: 110/74 Prior BP: 130/94 (12/27/2009)  Prior 10 Yr Risk Heart Disease: N/A (10/31/2006)  Labs Reviewed: K+: 3.8 (03/14/2010) Creat: : 1.1 (03/14/2010)   Chol: 134 (03/14/2010)   HDL: 49.30 (03/14/2010)   LDL: 65 (08/15/2009)   TG: 264.0 (03/14/2010)  Problem # 4:  HYPERLIPIDEMIA (ICD-272.4) controlled (  except TG---would improve with weight loss) continue current medications  Her updated medication list for this problem includes:    Lipitor 80 Mg Tabs (Atorvastatin calcium) .Marland Kitchen... Take 1 tab by mouth daily  Labs Reviewed: SGOT: 45 (03/14/2010)   SGPT: 38 (03/14/2010)  Prior 10 Yr Risk Heart Disease: N/A (10/31/2006)   HDL:49.30 (03/14/2010), 43.90 (08/15/2009)  LDL:65 (08/15/2009), DEL (16/10/9603)  Chol:134 (03/14/2010), 144 (08/15/2009)  Trig:264.0 (03/14/2010), 177.0 (08/15/2009)  Problem # 5:  ANXIETY (ICD-300.00) AND depression she has f/u with psychiatry tomorrow Her psychiatric issues are severe currently no suicidal thoughts.   Her updated medication list for this problem includes:    Lorazepam 1 Mg Tabs (Lorazepam) .Marland Kitchen... Five times a day as needed    Pristiq 100 Mg Xr24h-tab (Desvenlafaxine  succinate) .Marland Kitchen... Take one tab once daily  Problem # 6:  DISORDER, BIPOLAR NEC (ICD-296.89) see above  Problem # 7:  ASTHMA (ICD-493.90) frequently related to allergic exacerbation---currently doing well  Her updated medication list for this problem includes:    Proair Hfa 108 (90 Base) Mcg/act Aers (Albuterol sulfate) .Marland Kitchen... As needed    Symbicort 160-4.5 Mcg/act Aero (Budesonide-formoterol fumarate) .Marland Kitchen... 2 inhalations two times a day see refill meds.   Complete Medication List: 1)  Atenolol 25 Mg Tabs (Atenolol) .... One by mouth daily 2)  Levothroid 50 Mcg Tabs (Levothyroxine sodium) .... One by mouth daily 3)  Lisinopril-hydrochlorothiazide 20-12.5 Mg Tabs (Lisinopril-hydrochlorothiazide) .... Take 1 tablet by mouth once a day 4)  Zyrtec Allergy 10 Mg Tabs (Cetirizine hcl) .... As needed 5)  Lorazepam 1 Mg Tabs (Lorazepam) .... Five times a day as needed 6)  Lipitor 80 Mg Tabs (Atorvastatin calcium) .... Take 1 tab by mouth daily 7)  Bayer Aspirin 325 Mg Tabs (Aspirin) .... Take 1/4 tablet by mouth once a day 8)  Astelin 137 Mcg/spray Soln (Azelastine hcl) .... Take 1 spray in each nostril two times a day 9)  Flonase 50 Mcg/act Susp (Fluticasone propionate) .... Take 1 spray in each nostril once daily 10)  Lomotil 2.5-0.025 Mg Tabs (Diphenoxylate-atropine) .... Use as directed as needed 11)  Glyburide 5 Mg Tabs (Glyburide) .Marland Kitchen.. 1 by mouth two times a day with meals 12)  Zantac 75 75 Mg Tabs (Ranitidine hcl) .... Take 1 tablet by mouth two times a day 13)  Multivitamins Tabs (Multiple vitamin) .... Once daily 14)  Citracal Plus Bone Density Tabs (Multiple minerals-vitamins) .... Two daily 15)  Proair Hfa 108 (90 Base) Mcg/act Aers (Albuterol sulfate) .... As needed 16)  Januvia 100 Mg Tabs (Sitagliptin phosphate) .... Take 1 tab by mouth daily 17)  Symbicort 160-4.5 Mcg/act Aero (Budesonide-formoterol fumarate) .... 2 inhalations two times a day 18)  Doxycycline Hyclate 100 Mg Tabs  (Doxycycline hyclate) .Marland Kitchen.. 1 am and pm 19)  Hydrocodone-homatropine 5-1.5 Mg/28ml Syrp (Hydrocodone-homatropine) .Marland Kitchen.. 1-2 tsp q4h as needed cough 20)  Pristiq 100 Mg Xr24h-tab (Desvenlafaxine succinate) .... Take one tab once daily 21)  Benadryl 25 Mg Caps (Diphenhydramine hcl) .... Take one tab at bedtime as needed sleep  Patient Instructions: 1)  Please schedule a follow-up appointment in 4 months. 2)  labs one week prior to visit 3)  lipids---272.4 4)  lfts-995.2 5)  bmet-995.2 6)  A1C-250.02 7)     Prescriptions: FLONASE 50 MCG/ACT  SUSP (FLUTICASONE PROPIONATE) Take 1 spray in each nostril once daily  #1 x 6   Entered and Authorized by:   Birdie Sons MD   Signed by:   Birdie Sons MD  on 03/23/2010   Method used:   Electronically to        [facility]* (retail)       [address]       [place], [place]  [id number]       Ph: [phone]       Fax: [phone]   RxID:   [id number] ASTELIN 137 MCG/SPRAY  SOLN (AZELASTINE HCL) Take 1 spray in each nostril two times a day  #1 x 6   Entered and Authorized by:   Birdie Sons MD   Signed by:   Birdie Sons MD on 03/23/2010   Method used:   Electronically to        [facility]* (retail)       [address]       [place], [place]  [id number]       Ph: [phone]       Fax: [phone]   RxID:   [id number] PROAIR HFA 108 (90 BASE) MCG/ACT AERS (ALBUTEROL SULFATE) as needed  #1 x 0   Entered and Authorized by:   Birdie Sons MD   Signed by:   Birdie Sons MD on 03/23/2010   Method used:   Electronically to        [facility]* (retail)       [address]       [place], [place]  [address]       Ph: [phone]       Fax: [phone]   RxID:   [id number] SYMBICORT 160-4.5 MCG/ACT AERO (BUDESONIDE-FORMOTEROL FUMARATE) 2 inhalations two times a day  #2 x 3   Entered and Authorized by:   Birdie Sons MD   Signed by:   Birdie Sons MD on 03/23/2010   Method used:    Electronically to        [facility]* (retail)       [address]       [place], [place]  [address]       Ph: [phone]       Fax: [phone]   RxID:   [id number]

## 2011-01-30 NOTE — Progress Notes (Signed)
Summary: glyburide  Phone Note Refill Request Message from:  Fax from Pharmacy on January 03, 2010 12:28 PM  Refills Requested: Medication #1:  GLYBURIDE 5 MG  TABS 1 by mouth two times a day with meals  Method Requested: Electronic Initial call taken by: Kern Reap CMA Duncan Dull),  January 03, 2010 12:29 PM    Prescriptions: GLYBURIDE 5 MG  TABS (GLYBURIDE) 1 by mouth two times a day with meals  #60 Each x 6   Entered by:   Kern Reap CMA (AAMA)   Authorized by:   Birdie Sons MD   Signed by:   Kern Reap CMA (AAMA) on 01/03/2010   Method used:   Electronically to        [facility]* (retail)       [address]       [place], [place]  [address]       Ph: [phone]       Fax: [phone]   RxID:   [id number]

## 2011-01-30 NOTE — Assessment & Plan Note (Signed)
Summary: DIABETIC CONCERNS // RS   Vital Signs:  Patient profile:   61 year old female Weight:      236 pounds Temp:     100.1 degrees F oral Pulse rate:   84 / minute Pulse rhythm:   regular BP sitting:   124 / 98  (left arm) Cuff size:   large  Vitals Entered By: Alfred Levins, CMA (November 15, 2010 10:51 AM) CC: diabetes f/u, FBS this am was 211   CC:  diabetes f/u and FBS this am was 211.  History of Present Illness:  Follow-Up Visit      This is a 61 year old woman who presents for Follow-up visit.  The patient denies chest pain and palpitations.  Since the last visit the patient notes no new problems or concerns and being seen by a specialist.  The patient reports taking meds as prescribed, not monitoring BP, monitoring blood sugars, dietary noncompliance, and not exercising.  When questioned about possible medication side effects, the patient notes none.  patient see psychiatrist regularly.  Patient states depression is somewhat better. She denies chest pain, shortness of breath, PND, orthopnea. Appetite is normal., which is normal. No other complaints in a complete review of systems.  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 One By Mouth Daily 2)  Levothroid 50 Mcg Tabs (Levothyroxine Sodium) .... One By Mouth Daily 3)  Lisinopril-Hydrochlorothiazide 20-12.5 Mg Tabs  (Lisinopril-Hydrochlorothiazide) .... Take 1 Tablet By Mouth Once A Day 4)  Zyrtec Allergy 10 Mg  Tabs (Cetirizine Hcl) .... As Needed 5)  Alprazolam 1 Mg  Tabs (Alprazolam) .... 4 Times Daily As Needed 6)  Lipitor 80 Mg Tabs (Atorvastatin Calcium) .... Take 1 Tab By Mouth Daily 7)  Aspirin 81 Mg Tbec (Aspirin) .... Once Daily 8)  Astelin 137 Mcg/spray  Soln (Azelastine Hcl) .... Take 1 Spray in Each Nostril Two Times A Day 9)  Flonase 50 Mcg/act  Susp (Fluticasone Propionate) .... Take 1 Spray in Each Nostril Once Daily 10)  Lomotil 2.5-0.025 Mg  Tabs (Diphenoxylate-Atropine) .... Use As Directed As Needed 11)  Glyburide 5 Mg  Tabs (Glyburide) .Marland Kitchen.. 1 By Mouth Two Times A Day With Meals 12)  Zantac 75 75 Mg  Tabs (Ranitidine Hcl) .... Take 1 Tablet By Mouth Two Times A Day 13)  Multivitamins  Tabs (Multiple Vitamin) .... Once Daily 14)  Proair Hfa 108 (90 Base) Mcg/act Aers (Albuterol Sulfate) .... As Needed 15)  Januvia 100 Mg Tabs (Sitagliptin Phosphate) .... Take 1 Tab By Mouth Daily 16)  Symbicort 160-4.5 Mcg/act Aero (Budesonide-Formoterol Fumarate) .... 2 Inhalations Two Times A Day 17)  Pristiq 100 Mg Xr24h-Tab (Desvenlafaxine Succinate) .... Take One Tab Once Daily 18)  Benadryl 25 Mg Caps (Diphenhydramine Hcl) .... Take One Tab At Bedtime As Needed Sleep 19)  Restoril 15 Mg Caps (Temazepam) .... As Needed For Sleep  Allergies (verified): 1)  !  Sulfamethoxazole (Sulfamethoxazole) 2)  ! Morphine Sulfate Cr (Morphine Sulfate) 3)  ! Tylox (Oxycodone-Acetaminophen) 4)  ! Oxycodone Hcl (Oxycodone Hcl) 5)  Codeine Phosphate (Codeine Phosphate) 6)  Erythromycin  Past History:  Past Medical History: Last updated: 09/19/2007 Allergic rhinitis Anxiety---?bipolar Asthma Depression---followed by psych Evelene Croon) Diabetes mellitus, type II Diverticulosis, colon Hyperlipidemia Hypertension IBS Hypothyroidism Osteoarthritis, bilateral knee (reconstructive surgery) OSA  Past Surgical  History: Last updated: 08/26/2007 Colectomy, partial Hysterectomy, fibroids Total knee replacement Oophorectomy  Family History: Last updated: 01/16/2008 Family History Ovarian cancer-grandmother mother---COPD-deceased in May 27, 2007 Father-deceased 35 MI sister MI age 69 Family History of CAD Female 1st degree relative <60 Family History of CAD Female 1st degree relative <50 Family History High cholesterol Family History Hypertension  Social History: Last updated: 09/19/2007 Retired Never Smoked Regular exercise-no  Risk Factors: Exercise: no (09/19/2007)  Risk Factors: Smoking Status: never (09/19/2007)  Physical Exam  General:  obese female in no acute distress. HEENT exam atraumatic, normocephalic. Neck supple. Chest clear to auscultation. Cardiac exam S1-S2 are regular. Abdominal exam obese, to bowel sounds, soft. Extremities trace edema at the ankles. She seems to be more interactive than she has been on previous evaluations. She is smiling occasionally.   Impression & Recommendations:  Problem # 1:  DIABETES MELLITUS, TYPE II (ICD-250.00) lung discussion with patient regarding diabetic control. She is on multiple medications. She's unable to tolerate metformin. I think the next step is insulin therapy. Will start Lantus. Side effects discussed. Patient was trained while she was in the office. Her updated medication list for this problem includes:    Lisinopril-hydrochlorothiazide 20-12.5 Mg Tabs (Lisinopril-hydrochlorothiazide) .Marland Kitchen... Take 1 tablet by mouth once a day    Aspirin 81 Mg Tbec (Aspirin) ..... Once daily    Glyburide 5 Mg Tabs (Glyburide) .Marland Kitchen... 1 by mouth two times a day with meals    Januvia 100 Mg Tabs (Sitagliptin phosphate) .Marland Kitchen... Take 1 tab by mouth daily    Lantus Solostar 100 Unit/ml Soln (Insulin glargine) .Marland KitchenMarland KitchenMarland KitchenMarland Kitchen 15 units subcutaneously or as directed.  Orders: Nutrition Referral (Nutrition)  Complete Medication List: 1)  Atenolol 25 Mg Tabs (Atenolol)  .... One by mouth daily 2)  Levothroid 50 Mcg Tabs (Levothyroxine sodium) .... One by mouth daily 3)  Lisinopril-hydrochlorothiazide 20-12.5 Mg Tabs (Lisinopril-hydrochlorothiazide) .... Take 1 tablet by mouth once a day 4)  Zyrtec Allergy 10 Mg Tabs (Cetirizine hcl) .... As needed 5)  Alprazolam 1 Mg Tabs (Alprazolam) .... 4 times daily as needed 6)  Lipitor 80 Mg Tabs (Atorvastatin calcium) .... Take 1 tab by mouth daily 7)  Aspirin 81 Mg Tbec (Aspirin) .... Once daily 8)  Astelin 137 Mcg/spray Soln (Azelastine hcl) .... Take 1 spray in each nostril two times a day 9)  Flonase 50 Mcg/act Susp (Fluticasone propionate) .... Take 1 spray in each nostril once daily 10)  Lomotil 2.5-0.025 Mg Tabs (Diphenoxylate-atropine) .... Use as directed as needed 11)  Glyburide 5 Mg Tabs (Glyburide) .Marland Kitchen.. 1 by mouth two times a day with meals 12)  Zantac 75 75 Mg Tabs (Ranitidine hcl) .... Take 1 tablet by mouth two times a day 13)  Multivitamins Tabs (Multiple vitamin) .... Once daily 14)  Proair Hfa 108 (90 Base) Mcg/act Aers (Albuterol sulfate) .... As needed 15)  Januvia 100 Mg Tabs (Sitagliptin phosphate) .... Take 1 tab by mouth daily 16)  Symbicort 160-4.5 Mcg/act Aero (Budesonide-formoterol fumarate) .... 2 inhalations two times a day 17)  Pristiq 100 Mg Xr24h-tab (Desvenlafaxine succinate) .... Take one tab  once daily 18)  Benadryl 25 Mg Caps (Diphenhydramine hcl) .... Take one tab at bedtime as needed sleep 19)  Restoril 15 Mg Caps (Temazepam) .... As needed for sleep 20)  Lantus Solostar 100 Unit/ml Soln (Insulin glargine) .Marland Kitchen.. 15 units subcutaneously or as directed.  Patient Instructions: 1)  Please schedule a follow-up appointment in 1 month. 2)  labs one week prior to visit 3)  lipids---272.4 4)  lfts-995.2 5)  bmet-995.2 6)  A1C-250.02 7)     Prescriptions: LANTUS SOLOSTAR 100 UNIT/ML  SOLN (INSULIN GLARGINE) 15 units subcutaneously or as directed.  #3 boxes x 3   Entered and Authorized  by:   Birdie Sons MD   Signed by:   Birdie Sons MD on 11/15/2010   Method used:   Electronically to        [facility]* (retail)       [address]       [place], [place]  [address]       Ph: [phone]       Fax: [phone]   RxID:   [id number]    Orders Added: 1)  Nutrition Referral [Nutrition] 2)  Est. Patient Level IV [24401]

## 2011-01-30 NOTE — Progress Notes (Signed)
Summary: ORDER FOR LABWORK?  Phone Note Call from Patient   Caller: Patient Summary of Call: Pt adv she is to have labwork prior to appt in 2 mths (December 2011).... No orders were indicated on the pt instruction sheet.... Can you advise order for labwork?  Pt can be reached at [phone].  Initial call taken by: Debbra Riding,  October 13, 2010 12:15 PM  Follow-up for Phone Call        labs at time of OV Follow-up by: Birdie Sons MD,  October 13, 2010 2:49 PM  Additional Follow-up for Phone Call Additional follow up Details #1::        [facility] at # [phone].  Additional Follow-up by: Debbra Riding,  October 13, 2010 3:07 PM

## 2011-01-30 NOTE — Progress Notes (Signed)
  Phone Note Other Incoming   Request: Send information Summary of Call: Request for records received from [facility]. Request forwarded to [employer] to issue a form stating that their release is not current.

## 2011-01-30 NOTE — Assessment & Plan Note (Signed)
Summary: fu per pt/flu shot/njr   Vital Signs:  Patient profile:   61 year old female Height:      60.5 inches (153.67 cm) Weight:      239 pounds (108.64 kg) Temp:     99.1 degrees F (37.28 degrees C) oral Pulse rate:   84 / minute BP sitting:   128 / 100  (left arm) Cuff size:   large  Vitals Entered By: Josph Macho RMA (October 13, 2010 11:40 AM)  Serial Vital Signs/Assessments:  Time      Position  BP       Pulse  Resp  Temp     By                     120/90                         Birdie Sons MD  CC: Follow-up visit/ CF Is Patient Diabetic? Yes   CC:  Follow-up visit/ CF.  History of Present Illness: not seen in 6 months patient with multiple medical problems. She has significant psychiatric difficulty and follows up with a psychiatrist. In regards to her medical problems diabetes: She does not follow a diet or exercise plan. She eats whatever she wants. She states that she's not interested in treating her diabetes because she is so depressed.  Hypertension she does not follow a low salt or low calorie diet. She does take her medications.  She admits to depression. She denies suicidal or homicidal ideations. She denies specific complaints of chest pain, shortness breath, PND, orthopnea. She does admit to great deal of fatigue. No other complaints in a complete review of systems.  Current Medications (verified): 1)  Atenolol 25 Mg Tabs (Atenolol) .... One By Mouth Daily 2)  Levothroid 50 Mcg Tabs (Levothyroxine Sodium) .... One By Mouth Daily 3)  Lisinopril-Hydrochlorothiazide 20-12.5 Mg Tabs (Lisinopril-Hydrochlorothiazide) .... Take 1 Tablet By Mouth Once A Day 4)  Zyrtec Allergy 10 Mg  Tabs (Cetirizine Hcl) .... As Needed 5)  Lorazepam 1 Mg  Tabs (Lorazepam) .... Five Times A Day As Needed 6)  Lipitor 80 Mg Tabs (Atorvastatin Calcium) .... Take 1 Tab By Mouth Daily 7)  Aspirin 81 Mg Tbec (Aspirin) .... Once Daily 8)  Astelin 137 Mcg/spray  Soln (Azelastine Hcl)  .... Take 1 Spray in Each Nostril Two Times A Day 9)  Flonase 50 Mcg/act  Susp (Fluticasone Propionate) .... Take 1 Spray in Each Nostril Once Daily 10)  Lomotil 2.5-0.025 Mg  Tabs (Diphenoxylate-Atropine) .... Use As Directed As Needed 11)  Glyburide 5 Mg  Tabs (Glyburide) .Marland Kitchen.. 1 By Mouth Two Times A Day With Meals 12)  Zantac 75 75 Mg  Tabs (Ranitidine Hcl) .... Take 1 Tablet By Mouth Two Times A Day 13)  Multivitamins  Tabs (Multiple Vitamin) .... Once Daily 14)  Proair Hfa 108 (90 Base) Mcg/act Aers (Albuterol Sulfate) .... As Needed 15)  Januvia 100 Mg Tabs (Sitagliptin Phosphate) .... Take 1 Tab By Mouth Daily 16)  Symbicort 160-4.5 Mcg/act Aero (Budesonide-Formoterol Fumarate) .... 2 Inhalations Two Times A Day 17)  Hydrocodone-Homatropine 5-1.5 Mg/51ml Syrp (Hydrocodone-Homatropine) .Marland Kitchen.. 1-2 Tsp Q4h As Needed Cough 18)  Pristiq 100 Mg Xr24h-Tab (Desvenlafaxine Succinate) .... Take One Tab Once Daily 19)  Benadryl 25 Mg Caps (Diphenhydramine Hcl) .... Take One Tab At Bedtime As Needed Sleep  Allergies (verified): 1)  ! Sulfamethoxazole (Sulfamethoxazole) 2)  ! Morphine Sulfate Cr (Morphine  Sulfate) 3)  ! Tylox (Oxycodone-Acetaminophen) 4)  ! Oxycodone Hcl (Oxycodone Hcl) 5)  Codeine Phosphate (Codeine Phosphate) 6)  Erythromycin  Review of Systems       Flu Vaccine Consent Questions     Do you have a history of severe allergic reactions to this vaccine? no    Any prior history of allergic reactions to egg and/or gelatin? no    Do you have a sensitivity to the preservative Thimersol? no    Do you have a past history of Guillan-Barre Syndrome? no    Do you currently have an acute febrile illness? no    Have you ever had a severe reaction to latex? no    Vaccine information given and explained to patient? yes    Are you currently pregnant? no    Lot Number:[id number]   Exp Date:06/30/2011   Site Given  Left Deltoid IM Josph Macho RMA  October 13, 2010 11:43 AM   Physical  Exam  General:  morbidly obese female in no acute distress. HEENT exam atraumatic, normocephalic symmetric her muscles are intact. Neck is supple without lymphadenopathy or thyromegaly. Chest clear to auscultation. Cardiac exam S1-S2 are ready to. Abdominal exam obese, active bowel sounds, soft. Extremities there is no clubbing cyanosis or edema. Neurologic exam she is alert. Gait is normal.   Impression & Recommendations:  Problem # 1:  DIABETES MELLITUS, TYPE II (ICD-250.00) she really struggles with depression DM is not high on her priority list she understands the dangers of poorly controlled DM discussed weight loss.  Her updated medication list for this problem includes:    Lisinopril-hydrochlorothiazide 20-12.5 Mg Tabs (Lisinopril-hydrochlorothiazide) .Marland Kitchen... Take 1 tablet by mouth once a day    Aspirin 81 Mg Tbec (Aspirin) ..... Once daily    Glyburide 5 Mg Tabs (Glyburide) .Marland Kitchen... 1 by mouth two times a day with meals    Januvia 100 Mg Tabs (Sitagliptin phosphate) .Marland Kitchen... Take 1 tab by mouth daily  Labs Reviewed: Creat: 1.2 (08/30/2010)     Last Eye Exam: normal-pt's report (02/28/2010) Reviewed HgBA1c results: 9.9 (08/30/2010)  7.3 (03/14/2010)  Problem # 2:  HYPERTENSION (ICD-401.9) repeat BP some better HTN directly related to BP Her updated medication list for this problem includes:    Atenolol 25 Mg Tabs (Atenolol) ..... One by mouth daily    Lisinopril-hydrochlorothiazide 20-12.5 Mg Tabs (Lisinopril-hydrochlorothiazide) .Marland Kitchen... Take 1 tablet by mouth once a day  BP today: 128/100 Prior BP: 110/74 (03/23/2010)  Prior 10 Yr Risk Heart Disease: N/A (10/31/2006)  Labs Reviewed: K+: 4.9 (08/30/2010) Creat: : 1.2 (08/30/2010)   Chol: 163 (08/30/2010)   HDL: 43.90 (08/30/2010)   LDL: 65 (08/15/2009)   TG: 323.0 (08/30/2010)  Problem # 3:  HYPERLIPIDEMIA (ICD-272.4) controlled continue current medications  Her updated medication list for this problem includes:    Lipitor  80 Mg Tabs (Atorvastatin calcium) .Marland Kitchen... Take 1 tab by mouth daily  Labs Reviewed: SGOT: 38 (08/30/2010)   SGPT: 29 (08/30/2010)  Prior 10 Yr Risk Heart Disease: N/A (10/31/2006)   HDL:43.90 (08/30/2010), 49.30 (03/14/2010)  LDL:65 (08/15/2009), DEL (44/12/270)  Chol:163 (08/30/2010), 134 (03/14/2010)  Trig:323.0 (08/30/2010), 264.0 (03/14/2010)  Problem # 4:  DEPRESSION (ICD-311) she has regular f/u with dr Evelene Croon has chronic anxiety and some panic attacks Her updated medication list for this problem includes:    Lorazepam 1 Mg Tabs (Lorazepam) .Marland Kitchen... Five times a day as needed    Pristiq 100 Mg Xr24h-tab (Desvenlafaxine succinate) .Marland Kitchen... Take one tab once daily  Complete Medication List: 1)  Atenolol 25 Mg Tabs (Atenolol) .... One by mouth daily 2)  Levothroid 50 Mcg Tabs (Levothyroxine sodium) .... One by mouth daily 3)  Lisinopril-hydrochlorothiazide 20-12.5 Mg Tabs (Lisinopril-hydrochlorothiazide) .... Take 1 tablet by mouth once a day 4)  Zyrtec Allergy 10 Mg Tabs (Cetirizine hcl) .... As needed 5)  Lorazepam 1 Mg Tabs (Lorazepam) .... Five times a day as needed 6)  Lipitor 80 Mg Tabs (Atorvastatin calcium) .... Take 1 tab by mouth daily 7)  Aspirin 81 Mg Tbec (Aspirin) .... Once daily 8)  Astelin 137 Mcg/spray Soln (Azelastine hcl) .... Take 1 spray in each nostril two times a day 9)  Flonase 50 Mcg/act Susp (Fluticasone propionate) .... Take 1 spray in each nostril once daily 10)  Lomotil 2.5-0.025 Mg Tabs (Diphenoxylate-atropine) .... Use as directed as needed 11)  Glyburide 5 Mg Tabs (Glyburide) .Marland Kitchen.. 1 by mouth two times a day with meals 12)  Zantac 75 75 Mg Tabs (Ranitidine hcl) .... Take 1 tablet by mouth two times a day 13)  Multivitamins Tabs (Multiple vitamin) .... Once daily 14)  Proair Hfa 108 (90 Base) Mcg/act Aers (Albuterol sulfate) .... As needed 15)  Januvia 100 Mg Tabs (Sitagliptin phosphate) .... Take 1 tab by mouth daily 16)  Symbicort 160-4.5 Mcg/act Aero  (Budesonide-formoterol fumarate) .... 2 inhalations two times a day 17)  Hydrocodone-homatropine 5-1.5 Mg/6ml Syrp (Hydrocodone-homatropine) .Marland Kitchen.. 1-2 tsp q4h as needed cough 18)  Pristiq 100 Mg Xr24h-tab (Desvenlafaxine succinate) .... Take one tab once daily 19)  Benadryl 25 Mg Caps (Diphenhydramine hcl) .... Take one tab at bedtime as needed sleep  Other Orders: Flu Vaccine 83yrs + MEDICARE PATIENTS (Z6109) Administration Flu vaccine - MCR (U0454)  Patient Instructions: 1)  Please schedule a follow-up appointment in 2 months.

## 2011-01-31 ENCOUNTER — Encounter: Payer: Self-pay | Admitting: Internal Medicine

## 2011-02-01 ENCOUNTER — Other Ambulatory Visit: Payer: Self-pay | Admitting: Internal Medicine

## 2011-02-01 DIAGNOSIS — I10 Essential (primary) hypertension: Secondary | ICD-10-CM

## 2011-02-01 NOTE — Assessment & Plan Note (Signed)
Summary: 2 MTH ROV // [facility]---PT [facility] / [facility]   Vital Signs:  Patient profile:   61 year old female Weight:      229 pounds BMI:     44.15 Temp:     99.2 degrees F oral Pulse rate:   72 / minute Pulse rhythm:   regular BP sitting:   142 / 90  (left arm) Cuff size:   large  Vitals Entered By: Alfred Levins, CMA (January 12, 2011 12:14 PM) CC: diabetes f/u, bs was 17 this am Is Patient Diabetic? Yes Did you bring your meter with you today? No   CC:  diabetes f/u and bs was 99 this am.  History of Present Illness:  Follow-Up Visit      This is a 61 year old woman who presents for Follow-up visit.  The patient denies chest pain and palpitations.  Since the last visit the patient notes no new problems or concerns and being seen by a specialist.  The patient reports taking meds as prescribed.  When questioned about possible medication side effects, the patient notes none.  she is feeling better. sees psychiatry regularly  All other systems reviewed and were negative except for ongoing fatigue, depression much better  Current Medications (verified): 1)  Atenolol 25 Mg Tabs (Atenolol) .... One By Mouth Daily 2)  Levothroid 50 Mcg Tabs (Levothyroxine Sodium) .... One By Mouth Daily 3)  Lisinopril-Hydrochlorothiazide 20-12.5 Mg Tabs (Lisinopril-Hydrochlorothiazide) .... Take 1 Tablet By Mouth Once A Day 4)  Zyrtec Allergy 10 Mg  Tabs (Cetirizine Hcl) .... As Needed 5)  Alprazolam 1 Mg  Tabs (Alprazolam) .... 4 Times Daily As Needed 6)  Lipitor 80 Mg Tabs (Atorvastatin Calcium) .... Take 1 Tab By Mouth Daily 7)  Aspirin 81 Mg Tbec (Aspirin) .... Once Daily 8)  Astelin 137 Mcg/spray  Soln (Azelastine Hcl) .... Take 1 Spray in Each Nostril Two Times A Day 9)  Flonase 50 Mcg/act  Susp (Fluticasone Propionate) .... Take 1 Spray in Each Nostril Once Daily 10)  Lomotil 2.5-0.025 Mg  Tabs (Diphenoxylate-Atropine) .... Use As Directed As Needed 11)  Glyburide 5 Mg  Tabs (Glyburide) .Marland Kitchen.. 1 By Mouth Two  Times A Day With Meals 12)  Zantac 75 75 Mg  Tabs (Ranitidine Hcl) .... Take 1 Tablet By Mouth Two Times A Day 13)  Multivitamins  Tabs (Multiple Vitamin) .... Once Daily 14)  Proair Hfa 108 (90 Base) Mcg/act Aers (Albuterol Sulfate) .... As Needed 15)  Januvia 100 Mg Tabs (Sitagliptin Phosphate) .... Take 1 Tab By Mouth Daily 16)  Symbicort 160-4.5 Mcg/act Aero (Budesonide-Formoterol Fumarate) .... 2 Inhalations Two Times A Day 17)  Pristiq 100 Mg Xr24h-Tab (Desvenlafaxine Succinate) .... Take One Tab Once Daily 18)  Benadryl 25 Mg Caps (Diphenhydramine Hcl) .... Take One Tab At Bedtime As Needed Sleep 19)  Restoril 15 Mg Caps (Temazepam) .... As Needed For Sleep 20)  Lantus Solostar 100 Unit/ml  Soln (Insulin Glargine) .Marland Kitchen.. 15 Units Subcutaneously or As Directed. 21)  Omega-3 350 Mg Caps (Omega-3 Fatty Acids) .... Once Daily  Allergies (verified): 1)  ! Sulfamethoxazole (Sulfamethoxazole) 2)  ! Morphine Sulfate Cr (Morphine Sulfate) 3)  ! Tylox (Oxycodone-Acetaminophen) 4)  ! Oxycodone Hcl (Oxycodone Hcl) 5)  Codeine Phosphate (Codeine Phosphate) 6)  Erythromycin  Past History:  Past Medical History: Last updated: 09/19/2007 Allergic rhinitis Anxiety---?bipolar Asthma Depression---followed by psych Evelene Croon) Diabetes mellitus, type II Diverticulosis, colon Hyperlipidemia Hypertension IBS Hypothyroidism Osteoarthritis, bilateral knee (reconstructive surgery) OSA  Past Surgical  History: Last updated: 08/26/2007 Colectomy, partial Hysterectomy, fibroids Total knee replacement Oophorectomy  Family History: Last updated: 01/16/2008 Family History Ovarian cancer-grandmother mother---COPD-deceased in May 23, 2007 Father-deceased 35 MI sister MI age 25 Family History of CAD Female 1st degree relative <60 Family History of CAD Female 1st degree relative <50 Family History High cholesterol Family History Hypertension  Social History: Last updated: 09/19/2007 Retired Never  Smoked Regular exercise-no  Risk Factors: Exercise: no (09/19/2007)  Risk Factors: Smoking Status: never (09/19/2007)  Physical Exam  General:  alert and well-developed.   Head:  normocephalic and atraumatic.   Eyes:  pupils equal and pupils round.   Ears:  R ear normal and L ear normal.   Neck:  No deformities, masses, or tenderness noted. Lungs:  normal respiratory effort and no intercostal retractions.   Heart:  normal rate and regular rhythm.   Abdomen:  soft and non-tender.   Skin:  turgor normal and color normal.   Psych:  normally interactive and good eye contact.     Impression & Recommendations:  Problem # 1:  HYPERTENSION (ICD-401.9)  reasonable control weight loss is key Her updated medication list for this problem includes:    Atenolol 25 Mg Tabs (Atenolol) ..... One by mouth daily    Lisinopril-hydrochlorothiazide 20-12.5 Mg Tabs (Lisinopril-hydrochlorothiazide) .Marland Kitchen... Take 1 tablet by mouth once a day  BP today: 142/90 Prior BP: 124/98 (11/15/2010)  Prior 10 Yr Risk Heart Disease: N/A (10/31/2006)  Labs Reviewed: K+: 4.9 (08/30/2010) Creat: : 1.2 (08/30/2010)   Chol: 163 (08/30/2010)   HDL: 43.90 (08/30/2010)   LDL: 65 (08/15/2009)   TG: 323.0 (08/30/2010)  Orders: Venipuncture (16109) Specimen Handling (60454) TLB-BMP (Basic Metabolic Panel-BMET) (80048-METABOL)  Problem # 2:  DIABETES MELLITUS, TYPE II (ICD-250.00) she had one significant hypoglycemia---see new medication list she has lost some weight---continue same Her updated medication list for this problem includes:    Lisinopril-hydrochlorothiazide 20-12.5 Mg Tabs (Lisinopril-hydrochlorothiazide) .Marland Kitchen... Take 1 tablet by mouth once a day    Aspirin 81 Mg Tbec (Aspirin) ..... Once daily    Glyburide 5 Mg Tabs (Glyburide) .Marland Kitchen... 1/2  by mouth two times a day with meals    Januvia 100 Mg Tabs (Sitagliptin phosphate) .Marland Kitchen... Take 1 tab by mouth daily    Lantus Solostar 100 Unit/ml Soln (Insulin  glargine) .Marland KitchenMarland KitchenMarland KitchenMarland Kitchen 15 units subcutaneously or as directed.  Labs Reviewed: Creat: 1.2 (08/30/2010)     Last Eye Exam: normal-pt's report (02/28/2010) Reviewed HgBA1c results: 9.9 (08/30/2010)  7.3 (03/14/2010)  Problem # 3:  HYPERLIPIDEMIA (ICD-272.4) check labs today.  Her updated medication list for this problem includes:    Lipitor 80 Mg Tabs (Atorvastatin calcium) .Marland Kitchen... Take 1 tab by mouth daily  Labs Reviewed: SGOT: 38 (08/30/2010)   SGPT: 29 (08/30/2010)  Prior 10 Yr Risk Heart Disease: N/A (10/31/2006)   HDL:43.90 (08/30/2010), 49.30 (03/14/2010)  LDL:65 (08/15/2009), DEL (09/81/1914)  Chol:163 (08/30/2010), 134 (03/14/2010)  Trig:323.0 (08/30/2010), 264.0 (03/14/2010)  Orders: TLB-Lipid Panel (80061-LIPID) TLB-Hepatic/Liver Function Pnl (80076-HEPATIC)  Complete Medication List: 1)  Atenolol 25 Mg Tabs (Atenolol) .... One by mouth daily 2)  Levothroid 50 Mcg Tabs (Levothyroxine sodium) .... One by mouth daily 3)  Lisinopril-hydrochlorothiazide 20-12.5 Mg Tabs (Lisinopril-hydrochlorothiazide) .... Take 1 tablet by mouth once a day 4)  Zyrtec Allergy 10 Mg Tabs (Cetirizine hcl) .... As needed 5)  Alprazolam 1 Mg Tabs (Alprazolam) .... 4 times daily as needed 6)  Lipitor 80 Mg Tabs (Atorvastatin calcium) .... Take 1 tab by mouth daily 7)  Aspirin  81 Mg Tbec (Aspirin) .... Once daily 8)  Astelin 137 Mcg/spray Soln (Azelastine hcl) .... Take 1 spray in each nostril two times a day 9)  Flonase 50 Mcg/act Susp (Fluticasone propionate) .... Take 1 spray in each nostril once daily 10)  Lomotil 2.5-0.025 Mg Tabs (Diphenoxylate-atropine) .... Use as directed as needed 11)  Glyburide 5 Mg Tabs (Glyburide) .... 1/2  by mouth two times a day with meals 12)  Zantac 75 75 Mg Tabs (Ranitidine hcl) .... Take 1 tablet by mouth two times a day 13)  Multivitamins Tabs (Multiple vitamin) .... Once daily 14)  Proair Hfa 108 (90 Base) Mcg/act Aers (Albuterol sulfate) .... As needed 15)  Januvia  100 Mg Tabs (Sitagliptin phosphate) .... Take 1 tab by mouth daily 16)  Symbicort 160-4.5 Mcg/act Aero (Budesonide-formoterol fumarate) .... 2 inhalations two times a day 17)  Pristiq 100 Mg Xr24h-tab (Desvenlafaxine succinate) .... Take one tab once daily 18)  Benadryl 25 Mg Caps (Diphenhydramine hcl) .... Take one tab at bedtime as needed sleep 19)  Restoril 15 Mg Caps (Temazepam) .... As needed for sleep 20)  Lantus Solostar 100 Unit/ml Soln (Insulin glargine) .Marland Kitchen.. 15 units subcutaneously or as directed. 21)  Omega-3 350 Mg Caps (Omega-3 fatty acids) .... Once daily  Other Orders: TLB-TSH (Thyroid Stimulating Hormone) [id number])  Patient Instructions: 1)  Please schedule a follow-up appointment in 2 months.   Orders Added: 1)  Est. Patient Level IV [54098] 2)  Venipuncture [11914] 3)  Specimen Handling [99000] 4)  TLB-Lipid Panel [80061-LIPID] 5)  TLB-Hepatic/Liver Function Pnl [80076-HEPATIC] 6)  TLB-BMP (Basic Metabolic Panel-BMET) [80048-METABOL] 7)  TLB-TSH (Thyroid Stimulating Hormone) [78295-AOZ]

## 2011-02-07 NOTE — Letter (Signed)
Summary: [facility]   Imported By: Maryln Gottron 02/01/2011 09:27:27  _____________________________________________________________________  External Attachment:    Type:   Image     Comment:   External Document

## 2011-03-13 ENCOUNTER — Other Ambulatory Visit: Payer: Self-pay | Admitting: Internal Medicine

## 2011-03-16 ENCOUNTER — Ambulatory Visit: Payer: Self-pay | Admitting: Internal Medicine

## 2011-03-19 ENCOUNTER — Other Ambulatory Visit: Payer: Self-pay | Admitting: Internal Medicine

## 2011-03-28 ENCOUNTER — Encounter: Payer: Self-pay | Admitting: Internal Medicine

## 2011-03-30 ENCOUNTER — Ambulatory Visit (INDEPENDENT_AMBULATORY_CARE_PROVIDER_SITE_OTHER): Payer: PRIVATE HEALTH INSURANCE | Admitting: Internal Medicine

## 2011-03-30 ENCOUNTER — Encounter: Payer: Self-pay | Admitting: Internal Medicine

## 2011-03-30 DIAGNOSIS — E119 Type 2 diabetes mellitus without complications: Secondary | ICD-10-CM

## 2011-03-30 DIAGNOSIS — E785 Hyperlipidemia, unspecified: Secondary | ICD-10-CM

## 2011-03-30 DIAGNOSIS — I10 Essential (primary) hypertension: Secondary | ICD-10-CM

## 2011-03-30 NOTE — Assessment & Plan Note (Signed)
Controlled Continue current meds 

## 2011-03-30 NOTE — Progress Notes (Signed)
  Subjective:    Patient ID: Meredith Hayden, female    DOB: 09/05/1950, 60 y.o.   MRN: [id number]  HPI   patient comes in for followup of multiple medical problems including type 2 diabetes, hyperlipidemia, hypertension. The patient does not check blood sugar or blood pressure at home. The patetient does not follow an exercise or diet program. The patient denies any polyuria, polydipsia.  In the past the patient has gone to diabetic treatment center. The patient is tolerating medications  Without difficulty. The patient does admit to medication compliance.   Past Medical History  Diagnosis Date  . Allergy   . Anxiety   . Asthma   . Depression   . Diabetes mellitus   . Hyperlipidemia   . Hypertension   . IBS (irritable bowel syndrome)   . Thyroid disease   . Arthritis   . OSA (obstructive sleep apnea)    Past Surgical History  Procedure Date  . Partial colectomy   . Abdominal hysterectomy   . Total knee arthroplasty   . Oophorectomy     reports that she has never smoked. She does not have any smokeless tobacco history on file. Her alcohol and drug histories not on file. family history includes COPD in her mother; Cancer in an unspecified family member; Heart attack in her father and sister; Hyperlipidemia in an unspecified family member; and Hypertension in an unspecified family member. Allergies  Allergen Reactions  . Codeine Phosphate     REACTION: unspecified  . Erythromycin   . Morphine Sulfate     REACTION: itching  . Oxycodone Hcl     REACTION: unspecified  . Oxycodone-Acetaminophen     REACTION: anaphylaxis  . Sulfamethoxazole     REACTION: rash     Review of Systems  patient denies chest pain, shortness of breath, orthopnea. Denies lower extremity edema, abdominal pain, change in appetite, change in bowel movements. Patient denies rashes, musculoskeletal complaints. No other specific complaints in a complete review of systems.      Objective:   Physical Exam  Well-developed well-nourished female in no acute distress. HEENT exam atraumatic, normocephalic, extraocular muscles are intact. Neck is supple. No jugular venous distention no thyromegaly. Chest clear to auscultation without increased work of breathing. Cardiac exam S1 and S2 are regular. Abdominal exam active bowel sounds, soft, nontende--obese. Extremities no edema. Neurologic exam she is alert without any motor sensory deficits. Gait is normal.        Assessment & Plan:

## 2011-03-30 NOTE — Assessment & Plan Note (Signed)
Much improved based on home CBGs Will d/c glyburide due to ypogleycemic events.

## 2011-03-30 NOTE — Assessment & Plan Note (Signed)
Previously controlled Continue same meds 

## 2011-05-04 ENCOUNTER — Other Ambulatory Visit (INDEPENDENT_AMBULATORY_CARE_PROVIDER_SITE_OTHER): Payer: PRIVATE HEALTH INSURANCE | Admitting: Internal Medicine

## 2011-05-04 DIAGNOSIS — I10 Essential (primary) hypertension: Secondary | ICD-10-CM

## 2011-05-04 DIAGNOSIS — E785 Hyperlipidemia, unspecified: Secondary | ICD-10-CM

## 2011-05-04 DIAGNOSIS — E119 Type 2 diabetes mellitus without complications: Secondary | ICD-10-CM

## 2011-05-04 DIAGNOSIS — IMO0001 Reserved for inherently not codable concepts without codable children: Secondary | ICD-10-CM

## 2011-05-04 DIAGNOSIS — T887XXA Unspecified adverse effect of drug or medicament, initial encounter: Secondary | ICD-10-CM

## 2011-05-04 LAB — LIPID PANEL
Cholesterol: 111 mg/dL (ref 0–200)
HDL: 38 mg/dL — ABNORMAL LOW (ref >39.00)
LDL Cholesterol: 48 mg/dL (ref 0–99)
Triglycerides: 125 mg/dL (ref 0.0–149.0)
VLDL: 25 mg/dL (ref 0.0–40.0)

## 2011-05-04 LAB — HEPATIC FUNCTION PANEL
Albumin: 4.2 g/dL (ref 3.5–5.2)
Total Protein: 6.7 g/dL (ref 6.0–8.3)

## 2011-05-04 LAB — BASIC METABOLIC PANEL
CO2: 28 mEq/L (ref 19–32)
Chloride: 102 mEq/L (ref 96–112)
Glucose, Bld: 159 mg/dL — ABNORMAL HIGH (ref 70–99)
Sodium: 139 mEq/L (ref 135–145)

## 2011-05-11 ENCOUNTER — Encounter: Payer: Self-pay | Admitting: Internal Medicine

## 2011-05-11 ENCOUNTER — Ambulatory Visit (INDEPENDENT_AMBULATORY_CARE_PROVIDER_SITE_OTHER): Payer: PRIVATE HEALTH INSURANCE | Admitting: Internal Medicine

## 2011-05-11 DIAGNOSIS — E039 Hypothyroidism, unspecified: Secondary | ICD-10-CM

## 2011-05-11 DIAGNOSIS — I10 Essential (primary) hypertension: Secondary | ICD-10-CM

## 2011-05-11 DIAGNOSIS — E119 Type 2 diabetes mellitus without complications: Secondary | ICD-10-CM

## 2011-05-11 MED ORDER — ATORVASTATIN CALCIUM 80 MG PO TABS: 40.0000 mg | ORAL_TABLET | Freq: Every day | ORAL | Status: DC

## 2011-05-11 NOTE — Assessment & Plan Note (Signed)
Much better control Stop glyburide

## 2011-05-11 NOTE — Progress Notes (Signed)
  Subjective:    Patient ID: Meredith Hayden, female    DOB: Nov 21, 1950, 61 y.o.   MRN: [id number]  HPI   patient comes in for followup of multiple medical problems including type 2 diabetes, hyperlipidemia, hypertension. The patient does not check blood sugar or blood pressure at home. The patetient does not follow an exercise or diet program. The patient denies any polyuria, polydipsia.  In the past the patient has gone to diabetic treatment center. The patient is tolerating medications  Without difficulty. The patient does admit to medication compliance.  Blood sugars much better---she tells me she is still taking glyburide She is walking some Past Medical History  Diagnosis Date  . Allergy   . Anxiety   . Asthma   . Depression   . Diabetes mellitus   . Hyperlipidemia   . Hypertension   . IBS (irritable bowel syndrome)   . Thyroid disease   . Arthritis   . OSA (obstructive sleep apnea)    Past Surgical History  Procedure Date  . Partial colectomy   . Abdominal hysterectomy   . Total knee arthroplasty   . Oophorectomy     reports that she has never smoked. She does not have any smokeless tobacco history on file. Her alcohol and drug histories not on file. family history includes COPD in her mother; Cancer in an unspecified family member; Heart attack in her father and sister; Hyperlipidemia in an unspecified family member; and Hypertension in an unspecified family member. Allergies  Allergen Reactions  . Codeine Phosphate     REACTION: unspecified  . Erythromycin   . Morphine Sulfate     REACTION: itching  . Oxycodone Hcl     REACTION: unspecified  . Oxycodone-Acetaminophen     REACTION: anaphylaxis  . Sulfamethoxazole     REACTION: rash     Review of Systems  patient denies chest pain, shortness of breath, orthopnea. Denies lower extremity edema, abdominal pain, change in appetite, change in bowel movements. Patient denies rashes, musculoskeletal complaints. No other  specific complaints in a complete review of systems.      Objective:   Physical Exam  Well-developed well-nourished female in no acute distress. HEENT exam atraumatic, normocephalic, extraocular muscles are intact. Neck is supple. No jugular venous distention no thyromegaly. Chest clear to auscultation without increased work of breathing. Cardiac exam S1 and S2 are regular. Abdominal exam active bowel sounds, soft, nontender. Extremities no edema. Neurologic exam she is alert without any motor sensory deficits. Gait is normal.        Assessment & Plan:

## 2011-05-11 NOTE — Assessment & Plan Note (Signed)
Well controlled D/c atenolol

## 2011-05-11 NOTE — Assessment & Plan Note (Signed)
Reviewed labs--- Continue same

## 2011-05-15 NOTE — Assessment & Plan Note (Signed)
[facility]                             PULMONARY OFFICE NOTE   SELEENA, REIMERS                       MRN:          [id number]  DATE:10/22/2007                            DOB:          1950/11/23    I saw Ms. Butson today in followup for her severe sleep apnea.   She is currently on CPAP at 11 cmH2O with a full face mask and heated  humidification.  She says that her sleep pattern has improved.  She is  having no trouble using her mask or machine.  She does have problems  with nasal congestion and postnasal drip.  She has been using her  Flonase and Astelin on a regular basis and says that this helps to some  degree.  She has not had any significant change in her health or her  medications since her last visit.   PHYSICAL EXAMINATION:  VITAL SIGNS:  She is 242 pounds.  Temperature  98.5, blood pressure 120/76, heart rate 87, oxygen saturation 95% on  room air.  HEENT:  There is no sinus tenderness.  She has a clear nasal discharge.  She has mild erythema of the posterior pharynx.  There is no  lymphadenopathy.   IMPRESSION:  1. Severe obstructive sleep apnea, currently doing quite well on      continuous positive airway pressure at 11 centimeters of water, and      I have encouraged her to maintain her compliance with this.  2. Allergic rhinitis with postnasal drip.  She is to continue on      Astelin and Flonase.  I have also instructed her on the use of      nasal irrigation.  3. Obesity.  I have discussed with her various techniques to try and      help with weight reduction.   I will follow up with her in approximately 6 months.     Coralyn Helling, MD  Electronically Signed    VS/MedQ  DD: 10/22/2007  DT: 10/23/2007  Job #: [id number]   cc:   Valetta Mole. Swords, MD

## 2011-05-15 NOTE — Assessment & Plan Note (Signed)
[facility]                            CARDIOLOGY OFFICE NOTE   Meredith Hayden, Meredith Hayden                       MRN:          [id number]  DATE:08/12/2008                            DOB:          [birth year]    A 61 year old patient referred for dyspnea, chest pain, and multiple  coronary risk factors.   The patient has been having chest pain.  The pain is somewhat atypical,  it can be exertional, but it can occur at rest.  It is sharp, it is in  the center of her chest.  It can radiate towards both shoulders.  She  has been getting it more frequently.  She has had it since 2007.  She  had a treadmill test 10 years ago for markedly positive family history.  She has a father who had an MI in his 86s and a sister who had an MI in  her 87s.   The patient is a longstanding diabetic.  She is a nonsmoker.  She has  hypercholesterolemia and hypertension.   The patient also has been having exertional dyspnea.  It seems  functional.  She has no history of abnormal lung disease.  She has not  had a chest x-ray in about 3 years.   She is concerned because there is a history of aneurysms in her family  as well.  She has been having mild epigastric pain.  It sounds more like  reflux, it is in the center of her epigastric area and can bore into the  back area.  There is no associated nausea, vomiting, or diarrhea.   The patient's past medical history is remarkable for bilateral plantar  fasciitis surgery, previous C-section, previous hysterectomy, previous  colon surgery.   Diabetes.  The patient has a history of a heart murmur.   She is allergic to SULFA, MORPHINE, TYLENOL WITH CODEINE, and  ERYTHROMYCIN.   Her current medications include Cymbalta 90 a day, temazepam 30 a day,  lorazepam 1 mg q.i.d., glyburide 5 b.i.d., Zantac 75 b.i.d., Lipitor 80  a day.   She is also intolerant to GLUCOPHAGE due to GI side effects.   After talking to her for a while, she  also indicates that she has a  history of cardiomegaly and a right bundle-branch block.   Looking back through her records, she indeed has a longstanding history  of a right bundle-branch block; however, I do not find any chest x-rays  in the chart that indicate cardiomegaly.  There is one from October 2007  that says heart size normal.   The patient has 2 children.  She is married.  She appears somewhat  depressed.  She claims to be disabled due to orthopedic and physical and  emotional stress.  She has been disabled since 2002.  She had her  granddaughter Ashok Cordia with her today.   Reading through Dr. Cato Mulligan' note, it appears that she does have some  depression and there is a statement there that says, I just do not  think him going to live a very long time.   Her family  history is remarkable for premature coronary artery disease  in her sister and father.   Her exam is remarkable for an overweight pale white female in no  distress.  Affect is somewhat subdued and concerned.  Her weight is 234,  blood pressure 140/80, pulse 95 and regular, respiratory rate 14  afebrile.  HEENT is unremarkable.  Carotids normal without bruit.  No  lymphadenopathy, thyromegaly, or JVP elevation.  There is no S1 and S2  with a soft systolic murmur.  PMI normal.  Abdomen is benign.  Bowel  sounds positive.  Multiple surgical scars.  No obvious epigastric pain.  Abdominal aorta is palpable, not necessarily enlarged.  No  hepatosplenomegaly or hepatojugular reflux.  No bruit.  Distal pulse  intact.  No edema.  Neuro is nonfocal.  Skin is warm and dry.  No  muscular weakness.   EKG shows sinus rhythm with right bundle-branch block, nonspecific ST-T  wave changes, question previous inferior wall MI.   IMPRESSION:  1. Chest pain in a longstanding diabetic with an abnormal EKG.  The      patient unable to exercise due to her arthritis and knee problems.      The patient will be referred for an adenosine  Myoview.  2. Dyspnea in the setting of an abnormal EKG, right bundle branch      block, question elevated pulmonary pressures.  Check 2D      echocardiogram.  She also has a systolic murmur on exam and this      will help elucidate that.  3. Hypertension, currently well controlled.  Continue current dose of      lisinopril and atenolol.  4. Hypercholesterolemia, continue simvastatin 40 a day.  5. Diabetes, intolerant to GLUCOPHAGE.  Continue glyburide, low-      caloric, low-carbohydrate diet.  6. History of reflux.  This may be the etiology to her epigastric pain      and continue Zantac.  The patient does have a family history of      aneurysms and her abdominal aorta is palpable.  We will check an      abdominal ultrasound to rule out abdominal aortic aneurysm.  7. Hypothyroidism.  Continue Levothroid 50 mcg a day.  TSH and T4 in 6      months per Dr. Cato Mulligan.  8. Anxiety/depression.  Continue Cymbalta, temazepam, and lorazepam.      Follow up with primary care and/or psychologist.   As long as the patient's echo and stress Myoview are normal, I think she  should probably have a stress test every 3 years if she remains  diabetic.     Noralyn Pick. Eden Emms, MD, [facility]  Electronically Signed    PCN/MedQ  DD: 08/12/2008  DT: 08/13/2008  Job #: [id number]   cc:   Valetta Mole. Swords, MD

## 2011-05-15 NOTE — Assessment & Plan Note (Signed)
[facility]                         GASTROENTEROLOGY OFFICE NOTE   Meredith Hayden, Meredith Hayden                       MRN:          [id number]  DATE:10/13/2007                            DOB:          [birth year]    The patient is self-referred.   REASON FOR EVALUATION:  Perirectal discomfort and irritable bowel  exacerbation.   HISTORY:  This is a 61 year old white female with hypertension, diabetes  mellitus, morbid obesity, bipolar disease, anxiety, asthma,  hyperlipidemia, chronic irritable bowel syndrome, partial colon  resection for a complicated diverticulitis, osteoarthritis with  bilateral knee replacements, hypothyroidism, hysterectomy for fibroids,  and chronic sinusitis.  The patient reports problems with irritable  bowel dating back to her 85s.  This is predominantly diarrhea type.  She  has seen Dr. Kinnie Scales as well as Dr. Corinda Gubler in the past for the same  problems.  She reports to me chronic problems with loose stools  exacerbated by meals, as well cramping abdominal discomfort relieved  with defecation.  She moves her bowels anywhere from 4-6 times per day.  She takes Imodium anywhere from 2-4 tablets per day.  Rare nocturnal  symptoms.  She did undergo a complete colonoscopy on November 29, 2005.  This was normal with evidence of prior sigmoid colon resection at 25-cm.  Diminutive colon polyp was excised.  Followup in 5 years recommended.  The patient reports to me that her symptoms have been worse over the  past 6 months.  No constipation.  No weight loss.  She does have a  history of reflux for which she takes Protonix.  This works well.  She  has been on Metformin for 2 years.  A number of different medications  have been tried in recent months to address her bipolar disorder.  She  denies bleeding.  She is also having some rectal pain for which she uses  Preparation-H.   PAST MEDICAL HISTORY:  Extensive as above.   PAST SURGICAL  HISTORY:  1. Sigmoid colon resection for diverticulitis.  2. Hysterectomy.  3. Tubal ligation.  4. Bilateral total knee replacements.   ALLERGIES:  1. SULFA.  2. MORPHINE.  3. TYLOX.  4. ERYTHROMYCIN.   CURRENT MEDICATIONS:  1. Levothyroxine 50 mcg daily.  2. Atenolol 25 mg daily.  3. Protonix 40 mg daily.  4. Simvastatin 40 mg daily.  5. Lisinopril/HCTZ 20/12.5 mg daily.  6. Tegretol 200 mg three to five tablets at night.  7. Lamictal 300 mg daily.  8. Cymbalta 60 mg daily.  9. Lorazepam 1 mg five times a day.  10.Restoril 30 mg at night.  11.Vivelle-Dot 0.375 mg two times weekly.  12.Glyburide/Metformin 2.5/5 mg two b.i.d.  13.Estrace 0.5 twice weekly.  14.Aspirin 1/4 tablet daily.  15.Astelin spray.  16.Flonase.  17.Albuterol inhaler.  18.Imodium p.r.n.   FAMILY HISTORY:  Negative for gastrointestinal malignancy.  Father and  sister with heart disease.   SOCIAL HISTORY:  The patient is married with 2 children, lives with her  husband, has a Scientist, research (physical sciences) in Nursing.  Does not smoke.  Rarely uses alcohol.   REVIEW OF SYSTEMS:  Per diagnostic evaluation form.   PHYSICAL EXAMINATION:  GENERAL:  Well-appearing female in no acute  distress.  VITAL SIGNS:  Blood pressure is 128/88, heart rate is 86 and regular,  weight is 244.4 pounds.  She is 5 feet 1-1/2 inches in height.  HEENT:  Sclerae are anicteric.  Conjunctivae are pink.  Oral mucosa is  intact.  No adenopathy.  LUNGS:  Clear.  HEART:  Regular.  ABDOMEN:  Obese and soft without tenderness, mass, or hernia.  Good  bowel sounds heard.  RECTAL:  Reveals no internal abnormalities, tenderness, or mass.  Stool  is hemoccult negative.  She does have some excoriated skin in the crack  of the buttock.  EXTREMITIES:  Without edema.   IMPRESSION:  1. Diarrhea predominant irritable bowel syndrome, worse over the past      6 months.  2. Excoriation of the buttock skin.  3. Multiple significant general medical problems.    RECOMMENDATIONS:  1. Obtain tissue transglutaminase antibody as a screening test for      occult sprue.  2. Initial the probiotic Align 1 daily for 2 weeks. Adequate samples      have been given.  3. Increase fiber from 1 tablet daily to 4 tablets daily.  4. Desitin to effected area on the buttock skin.  5. Change from Imodium to Lomotil p.r.n. diarrhea.  6. Avoid sugar substitutes and sugar free soft drinks as this may      exacerbate diarrhea.  7. Ongoing general medical care with Dr. Cato Mulligan.  8. GI office visit for followup in about 6 weeks.     Wilhemina Bonito. Marina Goodell, MD  Electronically Signed    JNP/MedQ  DD: 10/13/2007  DT: 10/13/2007  Job #: [id number]   cc:   Valetta Mole. Swords, MD

## 2011-05-17 ENCOUNTER — Telehealth: Payer: Self-pay | Admitting: *Deleted

## 2011-05-17 NOTE — Telephone Encounter (Signed)
Pt. Wants Dr. Cato Mulligan to know since stopping her Glyberide and Januvia.  Highest 160, Lowest 120. She has doubled her exercise, and is eating the same.  Needs recommendations.

## 2011-05-18 NOTE — H&P (Signed)
NAME:  Meredith Hayden, Meredith Hayden                ACCOUNT NO.:  [id number]   MEDICAL RECORD NO.:  [id number]          PATIENT TYPE:  EMS   LOCATION:  [facility]                         FACILITY:  [facility]   PHYSICIAN:  Bimal R. Sherryll Burger, MD      DATE OF BIRTH:  1950-06-30   DATE OF ADMISSION:  10/15/2006  DATE OF DISCHARGE:                                HISTORY & PHYSICAL   CHIEF COMPLAINT:  Chest pain.   HISTORY OF PRESENT ILLNESS:  This is a 61 year old woman with a history of  diabetes mellitus, hypertension, hyperlipidemia and bipolar disorder who has  a positive family history with a sister who died at the age of 53 from an  MI, who states around 2 p.m. today she had the sudden onset of feeling cold  and clammy with the onset of 5/10 chest pain that was associated with  nausea.  She describes the chest pain as tightness that radiates underneath  her left shoulder; however, it did not radiate to her arm or jaw.  She  states that over time it spontaneously resolved after about 3 hours.  However, about 3-1/2 hours later, around 5:30 p.m., she was taking her dog  for a walk when she became lightheaded and had recurrence of the exact same  symptoms.  She did not have any presyncope or syncope.  She denies any  palpitations.  At that time, she took her fingerstick, and her blood glucose  was 112.  As a result, she came to the emergency room for further evaluation  and care.  She states here in the emergency department that the  nitroglycerin has helped to ease her pain.  She has nitroglycerin paste on  now, which she says has helped with her discomfort.  She denies any chest  pain ever in the past, but states that she had a stress test approximately 6  years ago that was negative, as a result of her sister having passed away at  the age of 71 from an MI.  She also denies any dyspnea on exertion,  orthopnea or PND.  She does state that she has chronic lower extremity  edema.  She does state that approximately a  week ago she did have an upper  respiratory infection.  She still has a mild non-product cough.  Denies any  fever or chills.   PAST MEDICAL HISTORY:  1. Diabetes mellitus on oral hypoglycemics.  2. Obesity.  3. Osteoarthritis with bilateral total knee replacements.  4. Bipolar disorder, depression, anxiety with a history of attempted drug      overdose.  5. Diverticulosis.  6. Asthma.  7. Hypothyroidism.  8. Hypertension.  9. Hyperlipidemia.   ALLERGIES:  1. SULFA.  2. ERYTHROMYCIN.  3. HYDROCODONE.  4. MORPHINE.   CURRENT MEDICATIONS:  1. Metformin 500 mg b.i.d.  2. Synthroid 50 mcg daily.  3. Atenolol 25 mg daily.  4. Omeprazole 20 mg b.i.d.  5. Zocor 40 mg daily.  6. Lisinopril 20 mg daily.  7. Hydrochlorothiazide 12.5 mg daily.  8. Geodon 80 mg daily.  9. Lamictal 300 mg  daily.  10.Cymbalta 50 mg daily.  11.Lorazepam 1 mg q.6h. p.r.n.  12.albuterol inhaler p.r.n.  13.Glyburide 2.5 mg b.i.d.   SOCIAL HISTORY:  The patient lives in [place] with her husband.  She is  on disability.  She is a former Nurse, learning disability.  She denies any tobacco use  ever.  She denies any alcohol or drugs.  Her mother has depression.  Her  father had alcohol abuse.  They are both living.  She has siblings with  histories of schizophrenia and depression.  She does have 1 sister who died  at the age of 24 of a myocardial infarction.   REVIEW OF SYSTEMS:  As above in HPI.  Otherwise, 18-point review of systems  is negative.   PHYSICAL EXAMINATION:  VITAL SIGNS:  Temperature is 96.1, blood pressure is  90/59, pulse of 70, respiratory rate 18.  Weight is 235 pounds.  O2  saturation is 98% on room air.  GENERAL:  She is obese, in no acute distress.  HEENT:  Normocephalic and atraumatic.  Pupils equal, round and reactive to  light.  Her oropharynx is clear.  NECK:  Supple with no lymphadenopathy.  Her thyroid is normal.  She has no  carotid bruits.  She has 2+ carotid upstrokes, symmetric  bilaterally.  Her  JVP is flat.  LUNGS:  Clear to auscultation bilaterally with no wheezes, rhonchi or rales.  CARDIOVASCULAR:  Regular rate and rhythm.  Normal S1 and S2.  ABDOMEN:  Truncal obesity, positive bowel sounds, soft, nontender,  nondistended.  EXTREMITIES:  2+ radial and posterior tibials.  Pulses are symmetric  bilaterally with trace bilateral lower extremity edema without any clubbing  or cyanosis.  NEUROLOGIC:  Cranial nerves III-XII are intact.  She has 5/5 motor strength  throughout.   LABORATORY DATA:  Chest x-ray shows a normal cardiac silhouette without any  edema, infiltrate or effusion.  An EKG shows a normal sinus rhythm at a rate  of 65 with right superior axis.  A possible incomplete right bundle branch  block.  She has inferior Q waves that are old, as well as T wave inversions  with some ST segment depression laterally, which are all unchanged as  compared to an EKG done on May 26, 2003.  Her CBC is within normal limits.  Chem-7 is within normal limits with a creatinine of 1.0, bicarbonate of 24,  glucose of 180.  A BNP is less than 30.  First set of enzymes show a CK-MB  of 1.3, a second set of 1.0.  His 2 sets of troponins are less than 0.5.  He  had a D-dimer that was 0.26.   ASSESSMENT:  1. Unstable angina in a woman with multiple cardiac risk factors, as well      as a family history.  2. Diabetes mellitus.  3. Hypertension.  4. Hyperlipidemia.  5. History of bipolar disorder.   PLAN:  The patient will be admitted to a telemetry bed, given her typical  symptoms and her cardiac risk factors and family history.  She will be ruled  out by cardiac enzymes x3.  She will be continued on her beta blocker,  although I have switched this to short-acting metoprolol, her statin and ACE  inhibitor.  She will also be started on aspirin daily.  Will check a hemoglobin A1C and a lipid panel.  She will be placed on insulin sliding  scale, and held her oral  hypoglycemics in the event that she would  need a  cardiac catheterization.  She will be continued on her  antipsychotic medications.  Additionally, I have started her on enoxaparin,  given the recurrence of her symptoms and the typical features and her risk  factors.  If she does rule in, I will start her on a 2b3a and set her up for  a cardiac catheterization in the morning.           ______________________________  Bimal R. Sherryll Burger, MD     BRS/MEDQ  D:  10/16/2006  T:  10/16/2006  Job:  [id number]

## 2011-05-18 NOTE — Telephone Encounter (Signed)
Continue the same---have her call back next week with CBGs

## 2011-05-18 NOTE — Telephone Encounter (Signed)
Left detailed message on pt's voice mail

## 2011-05-18 NOTE — Procedures (Signed)
NAME:  Meredith Hayden, Meredith Hayden                ACCOUNT NO.:  [id number]   MEDICAL RECORD NO.:  [id number]          PATIENT TYPE:  OUT   LOCATION:  SLEEP CENTER                 FACILITY:  [facility]   PHYSICIAN:  Coralyn Helling, MD        DATE OF BIRTH:  1950-01-16   DATE OF STUDY:  12/10/2006                            NOCTURNAL POLYSOMNOGRAM   INDICATION FOR STUDY:  This is an individual who had recently undergone  surgery and was noted to have oxygen desaturations.  She also has a  history of hypertension and symptoms of sleep disturbance and excessive  daytime sleepiness.  She was referred to the sleep lab for evaluation of  hypersomnia with obstructive sleep apnea.   Her medications are Lunesta, Tylenol, Protonix, Ativan, atenolol,  Levoxyl, hormone replacement therapy, simvastatin, lisinopril,  hydrochlorothiazide, aspirin, glyburide, Metformin, calcium, and  albuterol.   EPWORTH SLEEPINESS SCORE:  8.   MEDICATIONS:  The patient took Lunesta, Tylenol, Protonix, and Ativan  prior to undergoing the study.   SLEEP ARCHITECTURE:  Total recording time was 392.5 minutes.  Total  sleep time was 283 minutes.  Sleep efficiency was 72%, which is reduced.  Sleep latency was 57 minutes, which was prolonged.  The study was  notable for the lack of slow wave sleep and REM sleep.  The patient  slept almost exclusively in the nonsupine position.   RESPIRATORY DATA:  The average respiratory rate was 18.  The apnea-  hypopnea index was 6.1.  The events were exclusively obstructive in  nature.  Moderate to slow wave was noted by the technician.   OXYGEN DATA:  The baseline oxygenation was 92%.  The oxygen saturation  data was 87%.  The patient spent a total of 363.8 minutes with an oxygen  saturation between 91-100% and 21.2 minutes with an oxygen saturation  between 81 to 90%.   CARDIAC DATA:  The average heart rate was 70, and the rhythm strip  showed a normal sinus rhythm.   MOVEMENT-PARASOMNIA:  The  periodic limb movement index was 18.4, which  is elevated.  The patient had two bathroom trips during the test time.  She was also observed reading and watching TV prior to going to sleep.   IMPRESSIONS-RECOMMENDATIONS:  This study shows evidence for mild  obstructive sleep apnea, as demonstrated by an apnea-hypopnea index of  6.1 and an oxygen saturation nadir of 87%.  This would be consistent  with a diagnosis of hypersomnia with obstructive sleep apnea.  The  severity of the patient's sleep apnea may be under-estimated, due to the  fact that she was not observed in either REM sleep or supine sleep  during this study.   She had an elevation in her periodic limb movement index, and clinical  correlation will be necessary to determine the significance of that.      Coralyn Helling, MD  Diplomat, American Board of Sleep Medicine  Electronically Signed     VS/MEDQ  D:  12/18/2006 09:58:42  T:  12/18/2006 11:58:17  Job:  [id number]

## 2011-05-18 NOTE — Assessment & Plan Note (Signed)
[facility]                             PULMONARY OFFICE NOTE   PEGGY, LOGE                         MRN:          [id number]  DATE:02/04/2007                            DOB:          Oct 13, 1950    I had reviewed the auto CPAP titration download for Ms. Lindo from  December 19, 2006 to January 23, 2007.  Her overall average IPI apnea-  hypopnea index was 2.4.  She did not have any evidence for significant  leak.  Her 90th percentile pressure was 11 cm of water.  She had 75% of  the days with usage.  She had an average usage of 7 hours and 39 minutes  on the days that she used it.  I will have her set up for CPAP at 11 cm  of water, and then follow up with her in the office to assess her  tolerance of this.     Coralyn Helling, MD  Electronically Signed    VS/MedQ  DD: 02/04/2007  DT: 02/04/2007  Job #: [id number]

## 2011-05-18 NOTE — Cardiovascular Report (Signed)
NAMESHAWNTAE, LOWY NO.:  [id number]   MEDICAL RECORD NO.:  [id number]          PATIENT TYPE:  INP   LOCATION:  2027                         FACILITY:  [facility]   PHYSICIAN:  Salvadore Farber, MD  DATE OF BIRTH:  [birth year]   DATE OF PROCEDURE:  10/17/2006  DATE OF DISCHARGE:                              CARDIAC CATHETERIZATION   PROCEDURE:  Left heart catheterization, left ventriculography, coronary  geography, StarClose closure of the right common femoral arteriotomy site.   INDICATIONS:  Ms. Rao is a 61 year old lady with multiple risk factors  for atherosclerotic coronary disease including obesity, diabetes mellitus,  hypertension, hypercholesterolemia, and strongly positive family history.  She presents with chest discomfort.  Electrocardiogram suggested prior  inferior myocardial infarction.  Cardiac enzymes were normal.  Due to high  pretest probability, she was referred for diagnostic angiography.   PROCEDURAL TECHNIQUE:  Informed consent was obtained.  Under 1% lidocaine  local anesthesia, a 5-French sheath was placed in the right common femoral  artery using the modified Seldinger technique.  Diagnostic angiography and  ventriculography were performed using JL4, JR4, and pigtail catheters.  The  arteriotomy was then closed using a StarClose device.  Complete hemostasis  was obtained.  The patient was then transferred to the holding room in  stable condition, having tolerated the procedure well.   COMPLICATIONS:  None.   FINDINGS:  1. LV:  134/18/24.  EF 60% without regional wall motion abnormality.  2. No aortic stenosis or mitral regurgitation.  3. Left main:  Short vessel which is angiographically normal.  4. LAD:  Fairly large vessel giving rise to three small diagonals.  The      proximal vessel has a 20% stenosis as does the mid vessel.  5. Circumflex:  Moderate-sized nondominant vessel giving rise to a single      large branching obtuse  marginal.  It is angiographically normal.  6. RCA:  Moderate-sized dominant vessel.  It is angiographically normal.   IMPRESSION/PLAN:  The patient has minimal nonobstructive coronary artery  disease.  I therefore suspect a noncardiac etiology to her chest discomfort.  Will discharge her home later today with plan for followup with Dr. Cato Mulligan.      Salvadore Farber, MD  Electronically Signed    WED/MEDQ  D:  10/17/2006  T:  10/18/2006  Job:  [id number]   cc:   Valetta Mole. Swords, MD

## 2011-05-18 NOTE — Assessment & Plan Note (Signed)
[facility]                               PULMONARY OFFICE NOTE   KIRSTAN, FENTRESS                       MRN:          [id number]  DATE:11/14/2006                            DOB:          04-Feb-1950    I met Ms. Pavia today for evaluation of her dyspnea and low oxygen  saturations noticed during recent hospitalization.  She was admitted to  [facility] on October 15, 2006 for chest pain, and she had  undergone left heart catheterization.  She had essentially normal coronary  arteries with an ejection fraction of 60%.  However, apparently during her  hospitalization, it was noted that she had oxygen desaturations.  As per the  patient, this occurred both during the day as well as night.  She apparently  was given Dilaudid, and was told that she was having problems with her  oxygen, particularly then.  She has had a history of asthma since childhood,  and she uses albuterol, which she has been using several times a week  recently.  She also has symptoms of reflux.  She has also been having  difficulties with her sleep.  She has been told that she snores, and she  will wake up often times coughing at night.  She says she tends to sleep on  her side, although she is not completely sure why.  She has been described  as a restless sleeper.  She will typically go to sleep at about 1 o'clock in  the morning, but then she is up and down all night.  She will use the  bathroom once or twice in the night, and then gets up in the morning at  about 10.  She feels quite tired when she wakes up in the morning, and this  gets progressively worse, particularly during the afternoon time.  She will  fall asleep fairly easily while watching TV or reading.  She is currently  using Restoril to help her fall asleep at night as well as Ativan to help  with anxiety.  She has tried using Ambien, Rozerem and Seroquel previously.  She use to work night shifts, but  currently is on disability.  She drinks  about 3 sodas a day.  She says that she gets short of breath when she is  walking.  She describes this as more of a feeling of heaviness with her  breathing, but not to the point where she actually has to stop her  activities.  She also has difficulties with rhinitis and postnasal drip, and  she has tried using Flonase and Nasonex previously.   PAST MEDICAL HISTORY:  Significant for:  1. Hypertension.  2. Diabetes.  3. Anxiety.  4. Insomnia.  5. Bipolar disease.  6. Asthma.  7. Elevated cholesterol.  8. Chronic sinusitis.  She had sinus surgery in 1984.  9. She has hypothyroidism.  10.She has had a hysterectomy for fibroids.  11.She has had bilateral total knee replacements.  12.She had bowel resection for diverticulosis.   ALLERGIES:  1. SULFA.  2. MORPHINE.  3. TYLOX.  4. CODEINE.  5. ERYTHROMYCIN.   CURRENT MEDICATIONS:  1. Atenolol 25 mg q.d.  2. Levoxyl 50 mcg q.d.  3. Hormone replacement therapy.  4. Protonix 40 mg q.d.  5. Simvastatin 40 mg q.d.  6. Lisinopril/hydrochlorothiazide combination 20/12.5 mg q.d.  7. Geodon 80 mg q.d.  8. Lamictal 300 mg q.d.  9. Cymbalta 60 mg q.d.  10.Glyburide/Metformin combination 2.5/5 mg 2 pills b.i.d.  11.Aspirin once daily.  12.Ativan 1 mg q.i.d. p.r.n.  13.Calcium supplement.  14.Restoril 30 mg nightly p.r.n.  15.Albuterol p.r.n.   SOCIAL HISTORY:  The patient is married.  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  There is no history of tobacco use.  She has very  seldom alcohol use.   FAMILY HISTORY:  Significant for mother had COPD and heart failure.  Her  father had heart disease.  She has a sister with heart disease and  hypertension, another sister with hypertension, and a grandmother who had  ovarian cancer.   REVIEW OF SYMPTOMS:  She does complain of symptoms of irritable bowel   syndrome.  She has lost approximately 20 pounds.  She does complain of  stiffness in her joints.   PHYSICAL EXAMINATION:  She is 5 feet 2 inches tall, 240 pounds.  Temperature  is 98.  Blood pressure is 118/70.  Heart rate is 88.  Oxygen saturation is  92% on room air.  HEENT:  Pupils are reactive.  Extraocular muscles intact.  There is mild  sinus tenderness over the maxillary sinus.  She has prominent nasal  turbinates.  She has mildly poor airway.  There is no lymphadenopathy and no  thyromegaly.  HEART:  S1 and S2.  Regular rate and rhythm.  CHEST:  Clear to auscultation.  ABDOMEN:  Obese, soft and non-tender.  EXTREMITIES:  No edema, cyanosis or clubbing.  NEUROLOGIC:  No focal deficits were appreciated.   IMPRESSION:  Apparent oxygen desaturations during her recent hospitalization  associated with dyspnea on exertion as well as having a history of asthma.  She is obese as well, and this could be contributing to some of her symptoms  of dyspnea in addition to being deconditioned.  Additionally, I would also  be concerned that she has some degree of obstructive sleep apnea.  What I  will do at this time is start her on Symbicort 160/4.5 two puffs b.i.d. and  continue her on her albuterol as needed.  I will also start her on Veramyst  2 sprays in each nostril once daily and Astelin 2 sprays in each nostril  b.i.d. to see if she can gain symptomatic relief from this combination.  I  have also instructed her on the use of nasal irrigation as well as saline  nasal drops.  I have discussed with her the importance of diet, exercise and  weight reduction.  I will also make arrangements for her to undergo an  overnight polysomnogram to further evaluate the possibility of obstructive  sleep apnea.  In addition, I have made arrangements for her to undergo a  pulmonary function test to further evaluate for obstructive airways disease. I plan on following up with her in approximately 3 to 4  weeks.     Coralyn Helling, MD  Electronically Signed    VS/MedQ  DD: 11/19/2006  DT: 11/19/2006  Job #: [id number]   cc:   Valetta Mole. Swords, MD

## 2011-05-18 NOTE — Discharge Summary (Signed)
Meredith Hayden, Meredith Hayden                ACCOUNT NO.:  [id number]   MEDICAL RECORD NO.:  [id number]          PATIENT TYPE:  INP   LOCATION:  2027                         FACILITY:  [facility]   PHYSICIAN:  Salvadore Farber, MD  DATE OF BIRTH:  12-09-1950   DATE OF ADMISSION:  10/15/2006  DATE OF DISCHARGE:  10/17/2006                                 DISCHARGE SUMMARY   PRIMARY CARE PHYSICIAN:  Dr. Cato Mulligan.   PRINCIPAL DIAGNOSIS:  Chest pain.   SECONDARY DIAGNOSES:  1. Checked for diabetes mellitus.  2. Obesity.  3. Osteoarthritis.  4. Bilateral total knee replacements.  5. Bipolar disorder.  6. Depression.  7. Anxiety.  8. History of attempted drug overdose.  9. Diverticuloses.  10.Asthma.  11.Hypothyroidism.  12.Hypertension.  13.Hyperlipidemia.   ALLERGIES:  1. SULFA.  2. ERYTHROMYCIN.   PROCEDURE:  Left heart cardiac catheterization.   HISTORY OF PRESENT ILLNESS:  A 61 year old female with prior history of  hypertension, hyperlipidemia, and diabetes, who was in her usual state of  health until 2 p.m. on October 15, 2006, when she had sudden onset of 5/10  chest pain with diaphoresis and nausea, which resolved spontaneously after 3  hours.  Unfortunately, she had returned discomfort, prompting her to present  to the [facility] ED, and she was admitted for further evaluation.   HOSPITAL COURSE:  Cardiac markers are negative x4; however, EKG showed  significant anterolateral T-wave inversion.  As a result, she was scheduled  for a left heart cardiac catheterization, which took place today, revealing  normal coronary arteries with an EF of 60% without regional wall motion  abnormalities.  She did not have any recurrent symptoms.  Being discharged  home this afternoon in satisfactory condition.   DISCHARGE LABS:  Hemoglobin 12.5, hematocrit 36.7, WBC 16.4, platelets 392,  MCV 90.2.  Sodium 135, potassium 4.1, chloride 98, CO2 of 28, BUN 16,  creatinine 1.0, glucose 211, PTT 27,  total bilirubin 0.6, alkaline  phosphatase 78, AST 24, ALT 24, albumin 4.1.  Cardiac markers negative x4.  Total cholesterol 152, triglycerides 155, HDL 48, LDL 73.  Calcium 8.9,  magnesium 2.1, TSH 0.997, hemoglobin A1c 6.6, D-dimer 0.26, BNP on admission  was less than 30.   DISPOSITION:  The patient is being discharged to home today in good  condition.   FOLLOWUP PLANS AND APPOINTMENTS:  She is asked to followup with her primary  care physician, Dr. Cato Mulligan, in 1-2 weeks.   DISCHARGE MEDICATIONS:  1. Metformin 1000 mg b.i.d. to be resumed October 19, 2006.  2. Synthroid 50 mcg daily.  3. Omeprazole 20 mg b.i.d.  4. Zocor 40 mg daily.  5. Lisinopril 20 mg daily.  6. Hydrochlorothiazide 12.5 mg daily.  7. Geodon 80 mg daily.  8. Lamictal 300 mg daily.  9. Cymbalta 60 mg daily.  10.Albuterol p.r.n.  11.Glyburide 2.5 mg b.i.d.  12.Atenolol 25 mg daily.   OUTSTANDING LAB STUDIES:  Urinalysis is pending.   DURATION DISCHARGE ENCOUNTER:  Thirty minutes including physician time.      Meredith Hayden, ANP  Salvadore Farber, MD  Electronically Signed    CB/MEDQ  D:  10/17/2006  T:  10/18/2006  Job:  [id number]   cc:   Valetta Mole. Swords, MD

## 2011-05-18 NOTE — Procedures (Signed)
[place]  Patient:    Meredith Hayden, Meredith Hayden Visit Number: [id number] MRN: [id number]          Service Type: SUR Location: [id number] Attending Physician:  Colbert Ewing Proc. Date: 08/25/01 Adm. Date:  08/25/2001   CC:         Anesthesia Department   Procedure Report  PROCEDURE:  Insertion of epidural catheter for postoperative analgesia.  SURGEON:  Cliffton Asters. Ivin Booty, M.D.  INDICATIONS:  The patient was brought to the operating room today by Dr. Eulah Pont for total knee replacement.  Dr. Michelle Piper had spoken with her preoperatively concerning the risks and benefits of this procedure.  He was in an emergency situation elsewhere and asked me to perform this insertion.  DESCRIPTION OF PROCEDURE:  At the conclusion of the operation, she was turned in the left lateral position.  Her back was prepped in a sterile fashion.  At the L2-3 interspace, a 17-gauge Tuohy needle was passed until a loss of resistance to a normal saline, 3 cc.  There was a negative aspiration of blood or CSF from the needle, and an 18-gauge epidural catheter was passed through the needle to a depth of 3 cm below the end of the needle.  The needle was removed and the catheter was secured.  There was a negative aspiration of blood or CSF from the catheter, and a bolus injection of 14 cc of 0.5% lidocaine containing 100 mcg of fentanyl was injected through the catheter.  The patient was turned supine, suctioned, and extubated in the operating room. She was taken to the PACU in good condition, and will be followed with a constant epidural infusion of 1/16% bupivacaine containing 5 mcg/cc of fentanyl. Attending Physician:  Colbert Ewing DD:  08/25/01 TD:  08/26/01 Job: [id number]

## 2011-05-18 NOTE — Assessment & Plan Note (Signed)
[facility]                             PULMONARY OFFICE NOTE   Meredith Hayden, Meredith Hayden                       MRN:          [id number]  DATE:12/16/2006                            DOB:          Nov 25, 1950    I saw Meredith Hayden today in followup for her dyspnea, history of asthma,  and oxygen desaturation.   Since her last visit, she had undergone an overnight polysomnogram which  was done on December 10, 2006, and this showed an overall apnea hypopnea  index of 6.1 with an oxygen saturation nadir of 87%.  Of note is that  she did not have any supine sleep or REM sleep during this study and,  therefore, the severity of her sleep apnea likely is underestimated.   She had also undergone pulmonary function tests in my office earlier in  the day and this showed a post-bronchodilator FEV1 to FVC ratio of 80%  with a FEV1 of 2.4 L which was 112% of predicted and a FVC of 3 L which  was 105% of predicted and no significant bronchodilator response.  Her  total lung capacity was 4.46 L which was 103% of predicted and her  diffusing capacity was 18.7 which was 74% of predicted.  These results  would be consistent with normal spirometry, normal lung volumes, and a  mild decrease in diffusing capacity which corrected for lung volumes.   She had tried using Symbicort but said that this did not seem to offer  her any significant benefit and actually seemed to make her symptoms  worse.  She said that she initially used a sample of Veramyst as well as  Astelin that I had given her which did seem to help, but, unfortunately,  her insurance company would not pay for any refills for the Veramyst  and, therefore, she is not currently using any nasal steroids.  She said  she still has some symptoms of nasal congestion with postnasal drip.   Her medication list was reviewed.   PHYSICAL EXAMINATION:  She is 230 pounds.  Temperature is 98.2.  Blood  pressure is 164/98.  Heart  rate is 72.  Oxygen saturation is 95% on room  air.  HEENT:  There is no sinus tenderness.  She has a clear nasal discharge.  There is no oral lesions.  No lymphadenopathy.  HEART:  S1, S2.  CHEST:  Clear to auscultation.  ABDOMEN:  Obese, soft, nontender.  EXTREMITIES:  No edema.   IMPRESSION:  1. Dyspnea.  She has essentially normal pulmonary function tests,      which would exclude the possibility of underlying chronic      obstructive pulmonary disease, although she may still have some      degree of asthma.  I feel that the majority of her symptoms related      to this are likely from her obesity and deconditioning.      Additionally, her chronic sinus disease is likely contributing to      her symptoms of cough.  I will have her continue on her Astelin.  I  have given her a prescription for Flonase and I have also      instructed her on the use of nasal irrigation.  I have also      discussed with her the importance of diet, exercise, and weight      reduction.   1. Oxygen desaturations at night with the overnight polysomnogram      showing evidence for a mild obstructive sleep apnea.  Again, she      did not have any supine sleep or REM sleep during her diagnostic      polysomnogram and, therefore, the severity of her sleep apnea is      likely under estimated.  I discussed with her the various treatment      options for her sleep apnea including continuous positive airway      pressure therapy or appliance and surgical intervention.  She would      prefer to undergo CPAP therapy.  Therefore, I have made      arrangements for her to have an auto titration study done for 2      weeks at home.  Then, depending upon her response to this, I would      either initiate her on a fixed pressure for her CPAP machine or      consider having her undergo an in laboratory overnight CPAP      titration study.   I will follow up with her in approximately 6-8 weeks.     Coralyn Helling, MD  Electronically Signed    VS/MedQ  DD: 12/17/2006  DT: 12/17/2006  Job #: [id number]   cc:   Valetta Mole. Swords, MD

## 2011-05-18 NOTE — Op Note (Signed)
[place]  Patient:    MANREET, KIERNAN Visit Number: [id number] MRN: [id number]          Service Type: SUR Location: [id number] Attending Physician:  Colbert Ewing Proc. Date: 08/25/01 Adm. 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  OPERATION PERFORMED:  Left total knee replacement with Osteonics prosthesis. Posterior stabilized #5 femoral component, #5 tibial component with 10 mm polyethylene insert.  Recessed nonmetalback 26 mm patellar component. Cemented.  Appropriate soft tissue balancing.  SURGEON:  Loreta Ave, M.D.  ASSISTANT:  Arlys John D. Petrarca, P.A.-C.  ANESTHESIA:  General.  ESTIMATED BLOOD LOSS:  Minimal.  TOURNIQUET TIME:  One hour 10 minutes.  SPECIMENS:  Excised bone and soft tissue.  CULTURES:  None.  COMPLICATIONS:  None.  DRESSING:  Soft compressive.  DRAINS:  Hemovac x 2.  DESCRIPTION OF PROCEDURE:  The patient was brought to the operating room and placed on the operating table in supine position.  After adequate anesthesia had been obtained, left knee examined.  5 degree flexion contract.  Alignment in varus correctable to neutral.  Stable ligaments.  Flexion to almost 90 degrees.  Tourniquet applied, prepped and draped in the usual sterile fashion. Exsanguinated with elevation and Esmarch.  Tourniquet inflated to 375 mmHg. Straight incision above the patella down to the tibial tubercle.  Skin and subcutaneous tissues divided.  Hemostasis obtained electrocautery.  Medial parapatellar arthrotomy. Knee exposed.  Grade four changes.  Remnants of menisci periarticular spurs.  Anterior cruciate ligament excised.  Significant contracture  posterior cruciate ligament which was therefore excised.  Distal femur exposed.  Intramedullary guide placed.  Distal cut at 5 degrees of valgus, sized to a #5 component.  Jigs put in place.  Definitive cuts made. Trial put in place and found to fit well, posterior stabilizing type.  Trial removed.  Tibia exposed.  Tibial spine removed with a saw.  Intramedullary guide placed.  Proximal cut with 5 degree posterior slope cut removing 4 to 5 mm off the deficient medial side.  Once complete, trials put in place. With the 10 mm polyethylene insert, full extension, full flexion, nicely balanced knee without lift-off with flexion.  Patella was sized, reamed and drilled for 26 mm component.  All trials in place.  Good tracking, good stability, good alignment confirmed.  Trials removed.  Copious irrigation with a pulse irrigating device.  Cement prepared and placed on tibial and patellar components which were seated and then polyethylene attached to the tibial component both being well compressed in place.  Femoral component seated. Knee reduced.  Re-examined.  Full extension, full flexion, good alignment, good stability, no lift off and good patellofemoral tracking.  Once cement had hardened the knee was reirrigated.  Hemovacs placed and brought out through separate stab wounds.  Arthrotomy closed with #1 Vicryl.  Skin and subcutaneous tissues with Vicryl and staples. Margins of wound injected Marcaine as was the knee.  Sterile compressive dressing applied.  Tourniquet deflated and removed. Knee immobilizer applied.  Anesthesia reversed.  Brought to recovery room.  Tolerated surgery well.  No complications. Attending Physician:  Colbert Ewing DD:  08/25/01  TD:  08/25/01 Job: [id number]

## 2011-05-18 NOTE — Assessment & Plan Note (Signed)
[facility]                             PULMONARY OFFICE NOTE   Meredith Hayden, Meredith Hayden                       MRN:          [id number]  DATE:01/24/2007                            DOB:          12-29-1950    I saw Meredith Hayden today in followup for her obstructive sleep apnea.  She is currently on auto C-PAP.  I have not received the download from  her home care company yet, although they are due to pick this up either  today or early next week.  She says that she is sleeping quite well with  the C-PAP machine.  She had no difficulty falling asleep nor staying  asleep with it.  She is currently using a full face mask with heated  humidification.  She has noted that she has better sleep quality and  increased energy during the day.  In fact, her main complaint is that  she is having persistent symptoms of reflux with chest discomfort and a  bitter taste in her mouth at night, when she tries to lie flat.  She is  currently on Protonix, which she is taking at night, and I have advised  her to try taking this 30-45 minutes before breakfast to see if this  improves her symptoms.  Otherwise, I will call her with the results of  her auto-titration study, but continue her on C-PAP and then follow up  with her in approximately four months.     Coralyn Helling, MD  Electronically Signed    VS/MedQ  DD: 01/24/2007  DT: 01/24/2007  Job #: [id number]   cc:   Valetta Mole. Swords, MD

## 2011-05-18 NOTE — H&P (Signed)
[facility]  Patient:    Meredith Hayden, Meredith Hayden                       MRN: [id number] Adm. Date:  [id number] Disc. Date: [id number] Attending:  Denny Peon Dictator:   Young Berry Scott, N.P.                         History and Physical  DATE OF EXAM:  Apr 30, 2001 at 3:15 p.m.  PHYSICAL EXAMINATION:  GENERAL:  This is a well-nourished, well-developed, grossly obese, white female, who appears to be her stated age.  She is relaxed and cooperative, although she subjectively feels quite anxious and she is in fact tearful for a good bit of the exam but she is relaxed and cooperative.  SKIN:  Skin is pale in tone and slightly freckled.  Patient has thick reddish-blond hair.  Hair distribution is normal for age and sex.  Hygiene is excellent.  Hair is well-groomed.  Skin and nails are in excellent condition. Skin is smooth, dry and warm.  Patient has a eight-inch midline scar from a bowel resection 2-1/2 years ago secondary to diverticulitis flare-ups. Patient also has a scar just to the right midline of her forehead about 1-1/2 inches long from an injury when she was 61 years old.  Scar is small and faint. Patient has no evidence of rashes.  HEAD:  Normocephalic without masses or tenderness.  No facial tenderness over sinuses.  EYES:  PERRLA.  Vessels show no changes on the fundi bilaterally.  Disks not visualized.  EARS:  External ear canals are patent.  Good light reflex bilaterally.  NOSE:  No rhinorrhea.  Septum is patent.  MOUTH:  Teeth in satisfactory condition.  Show evidence of dental work.  No breath odor.  Tongue is midline without fasciculation.  Palate rises symmetrically.  NECK:  Supple without thyromegaly.  AC PC nodes negative.  CARDIOVASCULAR:  S1, S2 heard.  Regular rate and rhythm.  No clicks, murmurs, gallops.  CHEST:  Symmetrical.  BREASTS:  Exam deferred.  LUNGS:  Clear to auscultation.  No CVA tenderness.  ABDOMEN:  Bowel  sounds slightly hyperactive and present in all four quadrants. No hepatomegaly by percussion.  No masses or tenderness.  MUSCULOSKELETAL:  Patient complains of chronic aching in both knees.  Pain today is 3/10.  Posture is upright.  Gait is normal without assisted devices. Strength is 5/5 throughout.  Spine is straight.  NEURO:  Extraocular movements are intact without fasciculation.  Cranial nerves II-XII are intact.  Motor movements are smooth without tremor.  Sensory is intact.  Deep tendon reflexes, biceps, patellar and Achilles reflex are 2+ out of 5.  Cerebellar function is intact for heel-to-shin maneuvers.  Romberg is without findings. DD:  04/30/01 TD:  05/02/01 Job: [id number]

## 2011-05-18 NOTE — Discharge Summary (Signed)
NAME:  Meredith Hayden, Meredith Hayden                          ACCOUNT NO.:  [id number]   MEDICAL RECORD NO.:  [id number]                   PATIENT TYPE:  INP   LOCATION:  5031                                 FACILITY:  [facility]   PHYSICIAN:  Loreta Ave, M.D.              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   DISCHARGE DIAGNOSES:  1. 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  2. Hypertension.  3. Neurotic depression.  4. Obesity.  5. Diverticulitis.   PROCEDURE:  Right total knee replacement.   HISTORY:  A 61 year old white female status post left total knee replacement  with good results, now having right knee pain especially with her activities  of daily living.  She has failed conservative treatment.  Radiographic end-  stage DJD is noted for a right total knee replacement.   HOSPITAL COURSE:  A 61 year old white female admitted May 26, 2003.  After  appropriate laboratory studies were obtained, as well as 1 g of Ancef IV on-  call to the operating room, was taken to the operating room where she  underwent a right total knee replacement.  She tolerated the procedure well.  She was continued on Ancef  1 g IV q.8h. x3 doses.  Heparin 5000 units subcu q.12h. until her Coumadin  became therapeutic.  Epidural was placed for postoperative pain management.  Foley was placed intraoperatively.  CPM 0-30 degrees for 8-10 hours per day  increasing by 10 degrees a day.  Consultation with PT, OT, and rehab were  made.  Ambulation weightbearing as tolerated.  She was started on Toradol on  May 28 at 15 mg IV q.4h. x3 doses and a PCA Dilaudid pump was begun also.  Epidural was discontinued on May 28, 2003.  She was weaned off of her PCA  pump, was placed on oral pain medicines only.  She did have  hypokalemia  which was corrected with oral potassium.  The remainder of her hospital  course was uneventful and she was discharged on June 1 to return back to the  office in 10-14 days for recheck evaluation.   LABORATORY DATA:  EKG reveals normal sinus rhythm with left axis deviation.  Inferoposterior infarction, age undetermined.  ST and T-wave abnormalities  which we should consider lateral ischemia.  Radiographic studies from  May 28, 2003 reveal satisfactory right knee replacement.  Chest x-ray of May  26 reveals no evidence of acute cardiopulmonary process.   Laboratory studies reveal a hemoglobin of 13.6, hematocrit 39.3%, white  count 9700, platelets 265,000.  Discharge hemoglobin was 9.6, hematocrit  27.3%, white count 10,300, and platelets 237,000.  Discharge pro time was  16.4 with an INR of 1.4.  Preoperative sodium 134,  potassium 4, chloride 99,  CO2 26, glucose 112, BUN 12, creatinine 1, calcium 9.4, total protein 7.5,  albumin 4.5, AST 19, ALT 16, ALP 57, total bilirubin 0.3.  Discharge sodium  136, potassium 3.3, chloride 96, CO2 32, glucose 106, BUN 9, creatinine 0.8,  and calcium 8.9.  Urinalysis revealed 0-2 white cells, no red cells, and too  numerous to count bacteria.  Blood type is O+, antibody screen negative.   DISCHARGE INSTRUCTIONS:  1. She was given a prescription for Norco 7.5/325 one to two tablets every     four hours as needed for pain.  2. Coumadin 5 mg as directed by [facility].  This will begin 7.5 mg on     the night of discharge.  3. Colace 100 mg twice a day.  4. Iron sulfate 325 mg b.i.d. with meals.  5. Lovenox 20 mg b.i.d. until stopped by [facility] pharmacist.  6. She will eat two bananas every day to help increase her potassium.  7. Activity as tolerated.  8. No other restrictions on diet.  9. Keep her wound clean and dry and change her dressing daily.  10.      Followup blue instruction sheet.  11.      Follow back up with [place] in 10-14  days for recheck evaluation.   CONDITION ON DISCHARGE:  Discharged in improved condition.     Oris Drone Petrarca, P.A.-C.                Loreta Ave, M.D.    BDP/MEDQ  D:  06/25/2003  T:  06/26/2003  Job:  [id number]

## 2011-05-18 NOTE — Op Note (Signed)
NAME:  Meredith Hayden, Meredith Hayden                          ACCOUNT NO.:  [id number]   MEDICAL RECORD NO.:  [id number]                   PATIENT TYPE:  INP   LOCATION:  2550                                 FACILITY:  [facility]   PHYSICIAN:  Loreta Ave, M.D.              DATE OF BIRTH:  [birth year]   DATE OF PROCEDURE:  05/28/2003  DATE OF DISCHARGE:                                 OPERATIVE REPORT   PREOPERATIVE DIAGNOSIS:  End-stage degenerative arthritis, right knee, with  varus alignment.   POSTOPERATIVE DIAGNOSIS:  End-stage degenerative arthritis, right knee, with  varus alignment.   PROCEDURE:  Right total knee replacement with soft tissue balancing and  medial capsular release.   COMPONENTS:  A press-fit #5 posterior-stabilized femoral component.  A  cemented #5 tibial component with 10 mm polyethylene posterior-stabilized  insert.  Cemented, recessed 26 mm patellar component.   SURGEON:  Loreta Ave, M.D.   ASSISTANT:  Arlys John D. Petrarca, P.A.-C.   ANESTHESIA:  General.   ESTIMATED BLOOD LOSS:  Minimal.   TOURNIQUET TIME:  1 hour 20 minutes.   SPECIMENS:  Bone and soft tissues.   CULTURES:  None.   COMPLICATIONS:  None.   DRESSING:  Soft compressive with knee immobilizer.   DESCRIPTION OF PROCEDURE:  The patient was brought to the operating room and  placed on the operating table in supine position.  After adequate anesthesia  had been obtained, right knee examined.  Five degrees hyperextension,  flexion to 120 degrees, limited by adiposity.  Stable ligaments.  Alignment  in 5 degrees of varus, correctable to neutral.  Reasonable patellofemoral  tracking.  Tourniquet applied, prepped and draped in the usual sterile  fashion.  Exsanguinated with elevation and Esmarch, tourniquet inflated to  350 mmHg.  A straight incision above the patella down to the tibial  tubercle.  Medial parapatellar arthrotomy, hemostasis with cautery.  Knee  exposed.  Grade 4 changes  throughout.  Medial capsule release.  Remnants of  menisci, cruciate ligaments, loose bodies, periarticular spurs all removed.  Distal femur exposed.  The intramedullary guide placed.  Distal cut,  appropriate rotation, resecting 10 mm, set at 5 degrees of valgus.  Sized  for a #5 component.  Jigs put in place, definitive cuts made.  Trial put in  place and found to fit well.  Trial removed.  Attention turned to the tibia.  Tibial spine removed with a saw.  Proximal cut removing 4-5 mm posterior  slope cut.  A subchondral cyst in the lateral tibial plateau curetted out  and packed with cancellous bone.  Patella sized, reamed, and drilled to a 26  mm component.  Trials put in place, #5 on the femur, #5 on the tibia, 26 mm  on the patella.  With the 10 mm posterior-stabilized polyethylene insert, I  had full extension, full flexion, a nicely balanced knee in valgus position  with  stable ligaments.  Tibia marked for appropriate rotation and hand-  reamed.  All trials removed.  Knee irrigated with the pulse irrigating  device.  Cement prepared, placed on the tibial component, which is hammered  in place.  Polyethylene attached.  Femoral component seated.  Patellar  component cemented.  All excessive cement removed.  Once the cement had  hardened, the knee was re-examined.  Full extension, full flexion, good  stability, good patellofemoral tracking.  A Hemovac was placed through a  separate stab wound.  Arthrotomy closed with #1 Vicryl, skin and  subcutaneous tissue with Vicryl and staples.  Margins of the wound and the  knee injected with Marcaine, and the Hemovac was clamped.  A sterile  compressive dressing applied.  Tourniquet deflated and removed.  Knee  immobilizer applied.  Anesthesia reversed.  Brought to the recovery room.  Tolerated the surgery well with no complications.                                               Loreta Ave, M.D.    DFM/MEDQ  D:  05/28/2003  T:   05/28/2003  Job:  [id number]

## 2011-05-18 NOTE — Discharge Summary (Signed)
[facility]  Patient:    Meredith Hayden, Meredith Hayden Visit Number: [id number] MRN: [id number]          Service Type: SUR Location: [id number] Attending Physician:  Colbert Ewing Dictated by:   Oris Drone Petrarca, P.A.-C. Admit Date:  08/25/2001 Discharge Date: 08/30/2001                             Discharge Summary  ADMITTING DIAGNOSIS:  Advanced degenerative joint disease of the left knee.  DISCHARGE DIAGNOSES: 1. Advanced degenerative joint disease of the left knee. 2. Postoperative anemia. 3. Hypertension. 4. Depressive disorder. 5. Exogenous obesity.  PROCEDURES:  Left total knee replacement.  HISTORY OF PRESENT ILLNESS:  The patient is a 61 year old white female with progressive pain in her left knee with radiographic end-stage DJD of the left knee.  She has failed conservative treatment including that of viscus supplementation.  She is now indicated for left total knee replacement because of inability to perform her activities of daily living without pain.  HOSPITAL COURSE:  [age]-year-old white female was admitted August 25, 2001.  After appropriate laboratory studies were obtained as well as 1 g of Ancef IV on call to the operating room, was taken to the operating room where she underwent a left total knee replacement.  She tolerated the procedure well.  She was placed on heparin 5000 units subcutaneous q.12h. until her Coumadin became therapeutic.  An epidural was placed postoperatively for pain management.  Consultations with PT, OT were obtained.  Knee immobilizer for ambulation was instituted.  PT weightbearing as tolerated on the left in the immobilizer.  Foley was placed intraoperatively.  She was allowed out of bed to a chair the following day.  She was noted to have some scattered rales on auscultation, and her saturation on 3 L was noted to be only 95%.  Chest x-ray was ordered as well as a rehabilitation consult on  August 27, 2001.  On August 28, 2001, her epidural was discontinued.  Foley was also discontinued.  She had her dressing changed, and her Hemovac was pulled on August 28, 2001, and her wound was benign.  She was typed and crossed, and two units of packed cells were given on that day.  She developed a depressive aphagia at times, and her Ativan was made only on a p.r.n. basis.  Overall, she did improve and was able to function in physical therapy.  Once she met her goals she was discharged on August 29, 2001, to return back to the office in 10 days for staple removal.  LABORATORY DATA:  EKG showed sinus rhythm with first-degree AV block.  Left axis deviation with incomplete right bundle branch block.  Inferoposterior infarct, age indeterminate, was also noted.  Chest x-ray of August 27, 2001, revealed minimal atelectasis.  August 19, 2001, revealed a hemoglobin of 13.2, hematocrit 38.9%, white count 9700, platelets 277,000.  August 28, 2001, hemoglobin 7.8, hematocrit 22.1%, white count 6400, platelets 150,000.  Chemistries of August 19, 2001, revealed sodium 137, potassium 4.2, chloride 101, CO2 29, glucose 106, BUN 14, creatinine 1.2, calcium 9.9, total protein 7.2, albumin 4.4, AST 19, ALT 12, ALP 60, total bilirubin 0.7.  August 28, 2001, sodium 136, potassium 4.6, chloride 98, CO2 32, glucose 144, BUN 2, creatinine 0.8, calcium 8.4. Urinalysis showed a trace hemoglobin and many epithelials, with white cells 11-20, 3-6 red cells, many bacteria noted preoperatively.  Blood type was O positive, antibody screen negative.  Transfused two units of packed cells during her hospital course.  DISCHARGE MEDICATIONS: 1. She will resume her home medications. 2. Coumadin 5 mg 1 tablet daily. 3. Dilaudid 2 mg 1 tablet q.4h. p.r.n. pain. 4. She is not to take her Vioxx or Ativan.  DISCHARGE INSTRUCTIONS:  Home health R.N. from [place] for her physical therapy and her blood draws.  Home CPM  0-50%, increasing 10-15 degrees a day, six to eight hours a day.  She may ambulate full weightbearing, using her walker.  No restrictions in her diet.  May shower and change dressing on a daily basis.  Call if temperature greater than 101.5 degrees, chills, or pain which is not relieved by medicines.  FOLLOW-UP:  She will follow back up in 10 days for recheck evaluation.  CONDITION ON DISCHARGE:  Improved. Dictated by:   Oris Drone Petrarca, P.A.-C. Attending Physician:  Colbert Ewing DD:  09/18/01 TD:  09/18/01 Job: [id number]

## 2011-05-18 NOTE — H&P (Signed)
[facility]  Patient:    Meredith Hayden, Meredith Hayden                       MRN: [id number] Adm. Date:  [id number] Disc. Date: [id number] Attending:  Denny Peon Dictator:   Young Berry Lorin Picket, N.P.                   Psychiatric Admission Assessment  DATE OF ASSESSMENT:  Apr 30, 2001 at 1:30 p.m.  IDENTIFYING INFORMATION:  This is a 61 year old white female, married, Caucasian, voluntarily admission for depression with plan to overdose. Patient reports symptoms of depression for approximately two years with increasing severity for the past six weeks.  "Ive never not been depressed." Patient reports this in relation to her first diagnosis of depression in 1973. Symptoms have been progressively disabling for the past two years with increasing anhedonia, tearfulness, sadness and hopelessness.  Patient has had difficulty concentrating and handling daily responsibilities, problems with decreased sleep with initial insomnia secondary to racing thoughts and constant worrying when she attempts to go to sleep.  Patient cites increased arthritis as a factor.  She is currently unable to do her job without pain and fatigue.  Patient, today, reports that she still does have some suicidal thoughts but no plan or intent at this point.  She is negative for homicidal ideation.  She is not having any auditory or visual hallucinations and no delusions.  She reports that anxiety gradually increasing has been a factor recently and that she has been having panic attacks.  PAST PSYCHIATRIC HISTORY:  Patient has no inpatient history.  She has been followed by Eliezer Bottom, M.D. as an outpatient.  She was last seen as an outpatient two weeks ago.  She has no history of previous suicide attempts. Patient was first diagnosed with depression in 1973 and has been on and off medications at various times.  SOCIAL HISTORY:  Patient is originally from [place].  She has lived here in [place] since [place].  She has been married x 1 for the past 27 years. She has two children, a 95 year old son and 52 year old daughter.  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 Family is supportive "when I let them."  Patient denies any legal problems. She has had some recent financial stresses due to decreased income secondary to her inability to carry a full work schedule.  FAMILY HISTORY:  Patient is the eldest of five children.  She has four living siblings.  Family history is remarkable for father with ETOH abuse, mother with depression, two sisters with depression, a brother with alcohol abuse and a brother with schizophrenia.  ALCOHOL/DRUG HISTORY:  Patient denies any abuse of alcohol or illegal drugs. She is a nonsmoker.  MEDICAL HISTORY:  Patient is followed by Dr. Cato Mulligan at [facility].  He is her primary care physician.  Medical problems include hypertension, irritable bowel syndrome, asthma and osteoarthritis.  MEDICATIONS:  Zoloft 200 mg p.o. q.d., Lotensin 20/12.5 mg p.o. daily, atenolol 25 mg daily, Norvasc 5 mg p.o. daily, Vioxx 25 mg p.o. q.d., Wellbutrin SR 150 mg p.o. daily, Levsin 1 tab p.r.n. for IBS symptoms and Imodium p.r.n. for diarrhea.  DRUG ALLERGIES:  SULFA, MORPHINE, OXYCODONE, ERYTHROMYCIN.  POSITIVE PHYSICAL FINDINGS:  Pending.  LABORATORY FINDINGS:  Pending.  MENTAL STATUS EXAMINATION:  This is a casually-dressed white female with tearful and sad affect.  She is also  mildly anxious though she is able to appreciate humor and laugh through her tears during the interview.  Speech is normal in pace, slightly soft in tone and she is articulate.  Mood is depressed, sad, tearful and anxious.  Thought process is logical and coherent. She is positive for suicidal ideation.  No plan.  No intent.  She is able to contract for safety on the unit.  No homicidal ideation.  No psychotic symptoms.  No delusions.   She is oriented x 3.  Cognitively is intact.  DIAGNOSES: Axis I:    Major depression, recurrent, severe. Axis II:   None. Axis III:  1. Hypertension.            2. Irritable bowel syndrome.            3. Asthma.            4. Osteoarthritis. Axis IV:   Moderate (problems coping with her medical problems, her job            stressors and coping with her own depression). Axis V:    Current 35; past year 1.  PLAN:  Admit the patient to stabilize her mood with 15-minute checks in place. The goal is to increase her coping skills and alleviate her suicidal thoughts. We will continue her Zoloft and we will discontinue her Wellbutrin since the patient reports no added benefit from the Wellbutrin which was added approximately one month ago.  We will also start her on Ativan 0.25 mg p.r.n. for anxiety and will provide Seroquel 12.5 mg q.h.s. and this may be repeated x 1 p.r.n. for insomnia.  Her PE and labs are pending.  We will continue her antihypertensive labs and monitor her blood pressure.  Casemanager will assist with the family session with her husband.  ESTIMATED LENGTH OF STAY:  Two to four days.DD:  04/30/01 TD:  05/02/01 Job: [id number]

## 2011-05-18 NOTE — Op Note (Signed)
[place]  Patient:    AVA, TANGNEY Visit Number: [id number] MRN: [id number]          Service Type: DSU Location: [facility] Attending Physician:  Colbert Ewing Dictated by:   Loreta Ave, M.D. Proc. 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  PROCEDURE:  Left shoulder exam under anesthesia, arthroscopy with debridement of glenoid and labrum, extensive debridement of partial tearing of long head biceps tendon, arthroscopic acromioplasty with bursectomy, CA ligament release, excision of distal clavicle.  SURGEON:  Loreta Ave, M.D.  ASSISTANT:  Arlys John D. Petrarca, P.A.-C.  ANESTHESIA:  General.  ESTIMATED BLOOD LOSS:  Minimal.  SPECIMENS:  None.  CULTURES:  None.  COMPLICATIONS:  None.  DRESSING:  Self-compressive with sling.  DESCRIPTION OF PROCEDURE:  The patient was brought to the operating room and after adequate anesthesia had been obtained, the left shoulder examined.  Full motion and good stability.  Placed in a beach chair position on the shoulder positioner, prepped and draped in the usual sterile fashion.  Three portals created, one each anterior and posterior, and lateral.  Shoulder entered with a blunt obturator, distended, and inspected.  Chondromalacia on the glenoid debrided with a shaver.  Attritional tearing labrum debrided with a shaver to a stable surface.  Long head biceps tendon had extensive intratendinous longitudinal  tearing, all of this debrided.  Still had integrity to the point that tenodesis is not indicated.  Capsular and ligamentous structures intact. Humeral head looked good.  Some attrition on the bottom of the cuff, but no full thickness tears.  Cannula redirected subacromially.  Chronic impingement. The cuff debrided and bursa resected.  Acromioplasty from a type 3 to a type 1 acromion with shaver and high speed bur, releasing the CA ligament.  The distal clavicle had grade 4 changes with periarticular spurring.  Spurs removed.  Lateral centimeter of clavicle resected.  Adequacy of decompression confirmed viewing from all portals.  Cuff thoroughly assessed and no full thickness tears.  Instruments and fluid removed.  Portals, shoulder, and bursa injected with Marcaine.  Portals closed with 4-0 nylon.  A sterile compressive dressing applied.  Anesthesia reversed.  Brought to the recovery room.  Tolerated surgery well with no complications. Dictated by:   Loreta Ave, M.D. Attending Physician:  Colbert Ewing DD:  02/12/02 TD:  02/13/02 Job: [id number]

## 2011-05-25 ENCOUNTER — Telehealth: Payer: Self-pay | Admitting: *Deleted

## 2011-05-25 NOTE — Telephone Encounter (Signed)
Reporting BS readings:  FBS:  140's to 150's  Lowest:  129

## 2011-05-25 NOTE — Telephone Encounter (Signed)
Continue same meds  Keep working on diet---these blood sugars are ok

## 2011-05-25 NOTE — Telephone Encounter (Signed)
Left message with Dr. Marliss Coots recommendations.

## 2011-07-24 ENCOUNTER — Ambulatory Visit: Payer: PRIVATE HEALTH INSURANCE | Admitting: Dietician

## 2011-08-07 ENCOUNTER — Ambulatory Visit: Payer: PRIVATE HEALTH INSURANCE | Admitting: Dietician

## 2011-09-04 ENCOUNTER — Other Ambulatory Visit: Payer: Self-pay | Admitting: Internal Medicine

## 2011-09-04 DIAGNOSIS — Z1231 Encounter for screening mammogram for malignant neoplasm of breast: Secondary | ICD-10-CM

## 2011-09-07 ENCOUNTER — Other Ambulatory Visit (INDEPENDENT_AMBULATORY_CARE_PROVIDER_SITE_OTHER): Payer: PRIVATE HEALTH INSURANCE

## 2011-09-07 DIAGNOSIS — E119 Type 2 diabetes mellitus without complications: Secondary | ICD-10-CM

## 2011-09-07 LAB — HEPATIC FUNCTION PANEL
ALT: 13 U/L (ref 0–35)
Alkaline Phosphatase: 75 U/L (ref 39–117)
Bilirubin, Direct: 0 mg/dL (ref 0.0–0.3)
Total Bilirubin: 0.6 mg/dL (ref 0.3–1.2)

## 2011-09-07 LAB — BASIC METABOLIC PANEL
BUN: 22 mg/dL (ref 6–23)
Glucose, Bld: 157 mg/dL — ABNORMAL HIGH (ref 70–99)

## 2011-09-07 LAB — LIPID PANEL: VLDL: 23.4 mg/dL (ref 0.0–40.0)

## 2011-09-07 LAB — HEMOGLOBIN A1C: Hgb A1c MFr Bld: 6.8 % — ABNORMAL HIGH (ref 4.6–6.5)

## 2011-09-14 ENCOUNTER — Ambulatory Visit: Payer: PRIVATE HEALTH INSURANCE | Admitting: Internal Medicine

## 2011-09-19 ENCOUNTER — Ambulatory Visit
Admission: RE | Admit: 2011-09-19 | Discharge: 2011-09-19 | Disposition: A | Discharge status: Home / Self Care | Payer: PRIVATE HEALTH INSURANCE | Source: Ambulatory Visit | Attending: Internal Medicine | Admitting: Internal Medicine

## 2011-09-19 ENCOUNTER — Ambulatory Visit (INDEPENDENT_AMBULATORY_CARE_PROVIDER_SITE_OTHER): Payer: PRIVATE HEALTH INSURANCE | Admitting: Internal Medicine

## 2011-09-19 ENCOUNTER — Encounter: Payer: Self-pay | Admitting: Internal Medicine

## 2011-09-19 DIAGNOSIS — Z1231 Encounter for screening mammogram for malignant neoplasm of breast: Secondary | ICD-10-CM

## 2011-09-19 DIAGNOSIS — Z23 Encounter for immunization: Secondary | ICD-10-CM

## 2011-09-19 DIAGNOSIS — E119 Type 2 diabetes mellitus without complications: Secondary | ICD-10-CM

## 2011-09-19 DIAGNOSIS — I10 Essential (primary) hypertension: Secondary | ICD-10-CM

## 2011-09-19 DIAGNOSIS — E785 Hyperlipidemia, unspecified: Secondary | ICD-10-CM

## 2011-09-19 MED ORDER — ATORVASTATIN CALCIUM 20 MG PO TABS: 20.0000 mg | ORAL_TABLET | Freq: Every day | ORAL | Status: DC

## 2011-09-19 NOTE — Assessment & Plan Note (Signed)
Foot exam with preulcerative callous---recommend DM foot wear  a1c is great She has made tremendous progress with weight loss

## 2011-09-19 NOTE — Progress Notes (Signed)
Subjective:    Patient ID: Meredith Hayden, female    DOB: December 03, 1950, 61 y.o.   MRN: [id number]  HPI   patient comes in for followup of multiple medical problems including type 2 diabetes, hyperlipidemia, hypertension. The patient does not check blood sugar or blood pressure at home. The patetient does not follow an exercise or diet program. The patient denies any polyuria, polydipsia.  In the past the patient has gone to diabetic treatment center. The patient is tolerating medications  Without difficulty. The patient does admit to medication compliance. SHE HAS LOST WEIGHT!  Past Medical History  Diagnosis Date  . Allergy   . Anxiety   . Asthma   . Depression   . Diabetes mellitus   . Hyperlipidemia   . Hypertension   . IBS (irritable bowel syndrome)   . Thyroid disease   . Arthritis   . OSA (obstructive sleep apnea)    Past Surgical History  Procedure Date  . Partial colectomy   . Abdominal hysterectomy   . Total knee arthroplasty   . Oophorectomy     reports that she has never smoked. She does not have any smokeless tobacco history on file. Her alcohol and drug histories not on file. family history includes COPD in her mother; Cancer in an unspecified family member; Heart attack in her father and sister; Hyperlipidemia in an unspecified family member; and Hypertension in an unspecified family member. Allergies  Allergen Reactions  . Codeine Phosphate     REACTION: unspecified  . Erythromycin   . Morphine Sulfate     REACTION: itching  . Oxycodone Hcl     REACTION: unspecified  . Oxycodone-Acetaminophen     REACTION: anaphylaxis  . Sulfamethoxazole     REACTION: rash   Past Medical History  Diagnosis Date  . Allergy   . Anxiety   . Asthma   . Depression   . Diabetes mellitus   . Hyperlipidemia   . Hypertension   . IBS (irritable bowel syndrome)   . Thyroid disease   . Arthritis   . OSA (obstructive sleep apnea)    Past Surgical History  Procedure Date  .  Partial colectomy   . Abdominal hysterectomy   . Total knee arthroplasty   . Oophorectomy     reports that she has never smoked. She does not have any smokeless tobacco history on file. Her alcohol and drug histories not on file. family history includes COPD in her mother; Cancer in an unspecified family member; Heart attack in her father and sister; Hyperlipidemia in an unspecified family member; and Hypertension in an unspecified family member. Allergies  Allergen Reactions  . Codeine Phosphate     REACTION: unspecified  . Erythromycin   . Morphine Sulfate     REACTION: itching  . Oxycodone Hcl     REACTION: unspecified  . Oxycodone-Acetaminophen     REACTION: anaphylaxis  . Sulfamethoxazole     REACTION: rash    Review of Systems  patient denies chest pain, shortness of breath, orthopnea. Denies lower extremity edema, abdominal pain, change in appetite, change in bowel movements. Patient denies rashes, musculoskeletal complaints. No other specific complaints in a complete review of systems.  Some BRBPR    Objective:   Physical Exam   Well-developed well-nourished female in no acute distress. HEENT exam atraumatic, normocephalic, extraocular muscles are intact. Neck is supple. No jugular venous distention no thyromegaly. Chest clear to auscultation without increased work of breathing. Cardiac exam S1 and  S2 are regular. Abdominal exam active bowel sounds, soft, nontender. Extremities no edema. Neurologic exam she is alert without any motor sensory deficits. Gait is normal.     Assessment & Plan:

## 2011-09-19 NOTE — Assessment & Plan Note (Signed)
Great control Decrease lipitor

## 2011-09-24 ENCOUNTER — Other Ambulatory Visit: Payer: Self-pay | Admitting: Internal Medicine

## 2011-11-01 ENCOUNTER — Ambulatory Visit (INDEPENDENT_AMBULATORY_CARE_PROVIDER_SITE_OTHER): Payer: PRIVATE HEALTH INSURANCE

## 2011-11-01 ENCOUNTER — Telehealth: Payer: Self-pay | Admitting: *Deleted

## 2011-11-01 ENCOUNTER — Inpatient Hospital Stay (INDEPENDENT_AMBULATORY_CARE_PROVIDER_SITE_OTHER)
Admission: RE | Admit: 2011-11-01 | Discharge: 2011-11-01 | Disposition: A | Discharge status: Home / Self Care | Payer: PRIVATE HEALTH INSURANCE | Source: Ambulatory Visit | Attending: Emergency Medicine | Admitting: Emergency Medicine

## 2011-11-01 DIAGNOSIS — S61409A Unspecified open wound of unspecified hand, initial encounter: Secondary | ICD-10-CM

## 2011-11-01 DIAGNOSIS — T148 Other injury of unspecified body region: Secondary | ICD-10-CM

## 2011-11-01 NOTE — Telephone Encounter (Signed)
Pt called stating she was bitten multiple times yesterday by a cat, and her hand is extremely swollen, red and painful.  Advised to go directly to the ER or Urgent Care for oral or IV antibiotics.

## 2011-12-05 ENCOUNTER — Other Ambulatory Visit: Payer: Self-pay | Admitting: Internal Medicine

## 2011-12-21 ENCOUNTER — Other Ambulatory Visit: Payer: Self-pay | Admitting: *Deleted

## 2011-12-21 MED ORDER — LISINOPRIL-HYDROCHLOROTHIAZIDE 20-12.5 MG PO TABS: 1.0000 | ORAL_TABLET | Freq: Every day | ORAL | Status: DC

## 2012-01-07 ENCOUNTER — Other Ambulatory Visit: Payer: Self-pay | Admitting: Internal Medicine

## 2012-03-12 ENCOUNTER — Other Ambulatory Visit (INDEPENDENT_AMBULATORY_CARE_PROVIDER_SITE_OTHER): Payer: PRIVATE HEALTH INSURANCE

## 2012-03-12 DIAGNOSIS — E119 Type 2 diabetes mellitus without complications: Secondary | ICD-10-CM

## 2012-03-12 LAB — HEPATIC FUNCTION PANEL
ALT: 16 U/L (ref 0–35)
AST: 20 U/L (ref 0–37)
Alkaline Phosphatase: 71 U/L (ref 39–117)
Total Bilirubin: 0.2 mg/dL — ABNORMAL LOW (ref 0.3–1.2)

## 2012-03-12 LAB — LIPID PANEL
HDL: 48.8 mg/dL (ref >39.00)
Triglycerides: 111 mg/dL (ref 0.0–149.0)

## 2012-03-12 LAB — HEMOGLOBIN A1C: Hgb A1c MFr Bld: 5.9 % (ref 4.6–6.5)

## 2012-03-12 LAB — BASIC METABOLIC PANEL
BUN: 23 mg/dL (ref 6–23)
Calcium: 9.2 mg/dL (ref 8.4–10.5)
GFR: 56.92 mL/min — ABNORMAL LOW (ref >60.00)
Potassium: 4.1 mEq/L (ref 3.5–5.1)
Sodium: 139 mEq/L (ref 135–145)

## 2012-03-19 ENCOUNTER — Encounter: Payer: Self-pay | Admitting: Internal Medicine

## 2012-03-19 ENCOUNTER — Ambulatory Visit (INDEPENDENT_AMBULATORY_CARE_PROVIDER_SITE_OTHER): Payer: PRIVATE HEALTH INSURANCE | Admitting: Internal Medicine

## 2012-03-19 VITALS — BP 114/76 | HR 72 | Temp 98.3°F | Wt 188.0 lb

## 2012-03-19 DIAGNOSIS — E119 Type 2 diabetes mellitus without complications: Secondary | ICD-10-CM

## 2012-03-19 DIAGNOSIS — E785 Hyperlipidemia, unspecified: Secondary | ICD-10-CM

## 2012-03-19 DIAGNOSIS — E039 Hypothyroidism, unspecified: Secondary | ICD-10-CM

## 2012-03-19 DIAGNOSIS — I10 Essential (primary) hypertension: Secondary | ICD-10-CM

## 2012-03-19 MED ORDER — LISINOPRIL-HYDROCHLOROTHIAZIDE 20-12.5 MG PO TABS: 0.5000 | ORAL_TABLET | Freq: Every day | ORAL | Status: DC

## 2012-03-19 MED ORDER — INSULIN GLARGINE 100 UNIT/ML ~~LOC~~ SOLN: 10.0000 [IU] | Freq: Every day | SUBCUTANEOUS | Status: DC

## 2012-03-19 MED ORDER — ATORVASTATIN CALCIUM 10 MG PO TABS: 10.0000 mg | ORAL_TABLET | Freq: Every day | ORAL | Status: DC

## 2012-03-19 NOTE — Assessment & Plan Note (Signed)
Great control She currently taking lipitor 20 mg every every other day Decrease lipitor

## 2012-03-19 NOTE — Assessment & Plan Note (Signed)
Continue current meds 

## 2012-03-19 NOTE — Progress Notes (Signed)
Patient ID: Meredith Hayden, female   DOB: 20-Oct-1950, 62 y.o.   MRN: [id number]  patient comes in for followup of multiple medical problems including type 2 diabetes, hyperlipidemia, hypertension. The patient does not check blood sugar or blood pressure at home. The patetient does not follow an exercise or diet program. The patient denies any polyuria, polydipsia.  In the past the patient has gone to diabetic treatment center. The patient is tolerating medications  Without difficulty. The patient does admit to medication compliance.   Past Medical History  Diagnosis Date  . Allergy   . Anxiety   . Asthma   . Depression   . Diabetes mellitus   . Hyperlipidemia   . Hypertension   . IBS (irritable bowel syndrome)   . Thyroid disease   . Arthritis   . OSA (obstructive sleep apnea)     History   Social History  . Marital Status: Married    Spouse Name: N/A    Number of Children: N/A  . Years of Education: N/A   Occupational History  . Not on file.   Social History Main Topics  . Smoking status: Never Smoker   . Smokeless tobacco: Not on file  . Alcohol Use:   . Drug Use:   . Sexually Active:    Other Topics Concern  . Not on file   Social History Narrative  . No narrative on file    Past Surgical History  Procedure Date  . Partial colectomy   . Abdominal hysterectomy   . Total knee arthroplasty   . Oophorectomy     Family History  Problem Relation Age of Onset  . COPD Mother   . Heart attack Father   . Heart attack Sister   . Hyperlipidemia      fhx  . Hypertension      fhx  . Cancer      ovarian/grandmother    Allergies  Allergen Reactions  . Codeine Phosphate     REACTION: unspecified  . Erythromycin   . Morphine Sulfate     REACTION: itching  . Oxycodone Hcl     REACTION: unspecified  . Oxycodone-Acetaminophen     REACTION: anaphylaxis  . Sulfamethoxazole     REACTION: rash    Current Outpatient Prescriptions on File Prior to Visit    Medication Sig Dispense Refill  . albuterol (PROAIR HFA) 108 (90 BASE) MCG/ACT inhaler Inhale 2 puffs into the lungs every 6 (six) hours as needed.        . ALPRAZolam (XANAX) 1 MG tablet Take 1 mg by mouth 4 (four) times daily as needed.        . Arginine 1000 MG TABS Take 2 tablets by mouth daily.        Marland Kitchen aspirin 81 MG tablet Take 81 mg by mouth daily.        Marland Kitchen atorvastatin (LIPITOR) 20 MG tablet Take 1 tablet (20 mg total) by mouth daily.      Marland Kitchen azelastine (ASTELIN) 137 MCG/SPRAY nasal spray 1 spray by Nasal route 2 (two) times daily. Use in each nostril as directed       . budesonide-formoterol (SYMBICORT) 160-4.5 MCG/ACT inhaler Inhale 2 puffs into the lungs 2 (two) times daily.        . cetirizine (ZYRTEC) 10 MG tablet Take 10 mg by mouth as needed.        . Cholecalciferol (VITAMIN D3) 1000 UNITS CAPS Take 1 capsule by mouth daily.        Marland Kitchen  co-enzyme Q-10 30 MG capsule Take 30 mg by mouth daily.        . cyanocobalamin 100 MCG tablet Take 100 mcg by mouth daily.        Marland Kitchen desvenlafaxine (PRISTIQ) 100 MG 24 hr tablet Take 100 mg by mouth daily.        . diphenhydrAMINE (SOMINEX) 25 MG tablet Take 25 mg by mouth at bedtime as needed.        . diphenoxylate-atropine (LOMOTIL) 2.5-0.025 MG per tablet Take 1 tablet by mouth as directed.        . fluticasone (FLONASE) 50 MCG/ACT nasal spray 2 sprays by Nasal route daily.        . Garlic 100 MG TABS Take 1 tablet by mouth daily.        Marland Kitchen lamoTRIgine (LAMICTAL) 200 MG tablet Take 200 mg by mouth daily.        Marland Kitchen LANTUS SOLOSTAR 100 UNIT/ML injection INJECT 15 UNITS SUBCUTANEOUSLY DAILY.  10 mL  5  . LEVOTHROID 50 MCG tablet TAKE 1 TABLET ONCE DAILY.  30 each  5  . lisinopril-hydrochlorothiazide (ZESTORETIC) 20-12.5 MG per tablet Take 1 tablet by mouth daily.  90 tablet  1  . Multiple Vitamin (MULTIVITAMIN) tablet Take 1 tablet by mouth daily.        . Omega-3 350 MG CAPS Take 350 mg by mouth daily.        . ranitidine (ZANTAC) 75 MG tablet  Take 75 mg by mouth 2 (two) times daily.        . temazepam (RESTORIL) 15 MG capsule Take 15 mg by mouth at bedtime as needed.           patient denies chest pain, shortness of breath, orthopnea. Denies lower extremity edema, abdominal pain, change in appetite, change in bowel movements. Patient denies rashes, musculoskeletal complaints. No other specific complaints in a complete review of systems.   There were no vitals taken for this visit.  Well-developed well-nourished female in no acute distress. HEENT exam atraumatic, normocephalic, extraocular muscles are intact. Neck is supple. No jugular venous distention no thyromegaly. Chest clear to auscultation without increased work of breathing. Cardiac exam S1 and S2 are regular. Abdominal exam active bowel sounds, soft, nontender, overweeight. Extremities no edema. Neurologic exam she is alert without any motor sensory deficits. Gait is normal.

## 2012-03-19 NOTE — Assessment & Plan Note (Signed)
BP Readings from Last 3 Encounters:  03/19/12 114/76  09/19/11 112/82  05/11/11 110/82   Controlled Will decrease lisinopril

## 2012-04-24 ENCOUNTER — Encounter: Payer: Self-pay | Admitting: Internal Medicine

## 2012-05-14 ENCOUNTER — Ambulatory Visit (INDEPENDENT_AMBULATORY_CARE_PROVIDER_SITE_OTHER): Payer: PRIVATE HEALTH INSURANCE | Admitting: Family

## 2012-05-14 ENCOUNTER — Encounter: Payer: Self-pay | Admitting: Family

## 2012-05-14 ENCOUNTER — Other Ambulatory Visit: Payer: Self-pay | Admitting: Family

## 2012-05-14 VITALS — BP 120/70 | Temp 98.7°F | Wt 193.5 lb

## 2012-05-14 DIAGNOSIS — R197 Diarrhea, unspecified: Secondary | ICD-10-CM

## 2012-05-14 DIAGNOSIS — R1011 Right upper quadrant pain: Secondary | ICD-10-CM

## 2012-05-14 DIAGNOSIS — R1031 Right lower quadrant pain: Secondary | ICD-10-CM

## 2012-05-14 NOTE — Patient Instructions (Signed)
Giardiasis Giardiasis is an infection of the small intestine with the parasite Giardia intestinalis. Giardia intestinalis cannot be seen with the naked eye. It is often found in unclean (contaminated) water.  CAUSES  Infection can be caused by drinking contaminated water. Giardia intestinalis can also be found in some tap water. SYMPTOMS  An infection causes:  Explosive, foul smelling, watery diarrhea.   A Feeling of sickness in your stomach (nausea).   Abdominal cramps and pain.  It takes about 1 to 2 weeks after ingesting infected water or food to get sick. The illness usually lasts 2 to 4 weeks. Infection in infants and children can be long lasting. DIAGNOSIS  It can be diagnosed by stool exam. Blood tests may be needed. TREATMENT  Medications can be given to shorten the course of the illness. HOME CARE INSTRUCTIONS   In areas of contamination, boil your water if possible. Filtering tap water in areas of contamination removes most Giardia. Cysts of Giardia Intestinalis are resistant to chlorine.   Be careful handling soiled undergarments and diapers. If infection is present, it is easily passed by hand to mouth. Use good hand-washing techniques.   Follow up with your caregiver as directed.  SEEK MEDICAL CARE IF:  You do not get better. Document Released: 12/14/2000 Document Revised: 12/06/2011 Document Reviewed: 08/05/2008 [employer] Patient Information 2012 [employer]. 

## 2012-05-14 NOTE — Progress Notes (Signed)
Subjective:    Patient ID: Meredith Hayden, female    DOB: 12-11-1950, 62 y.o.   MRN: [id number]  HPI 62 year old white female, nonsmoker, patient of Dr. Cato Mulligan is in with concerns of exposure to Giardia. She has a puppy at home with some passive and around to her other dogs. Over the last 3 weeks, she's been experiencing bloating, pain, explosive diarrhea stools. Reports 4 stools a day on average all of which are not diarrhea stools. But does experience a sense of urgency and abdominal distention. She denies any nausea, vomiting, fever, muscle aches, blood in her stools, dark black stools. She's been using Mylicon drops and Imodium with little relief.   Review of Systems  Constitutional: Negative.   Respiratory: Negative.   Cardiovascular: Negative.   Gastrointestinal: Positive for abdominal pain, diarrhea and abdominal distention. Negative for nausea, vomiting, blood in stool and rectal pain.  Genitourinary: Negative.   Musculoskeletal: Negative.   Skin: Negative.   Neurological: Negative.   Hematological: Negative.   Psychiatric/Behavioral: Negative.    Past Medical History  Diagnosis Date  . Allergy   . Anxiety   . Asthma   . Depression   . Diabetes mellitus   . Hyperlipidemia   . Hypertension   . IBS (irritable bowel syndrome)   . Thyroid disease   . Arthritis   . OSA (obstructive sleep apnea)     History   Social History  . Marital Status: Married    Spouse Name: N/A    Number of Children: N/A  . Years of Education: N/A   Occupational History  . Not on file.   Social History Main Topics  . Smoking status: Never Smoker   . Smokeless tobacco: Not on file  . Alcohol Use:   . Drug Use:   . Sexually Active:    Other Topics Concern  . Not on file   Social History Narrative  . No narrative on file    Past Surgical History  Procedure Date  . Partial colectomy   . Abdominal hysterectomy   . Total knee arthroplasty   . Oophorectomy     Family History    Problem Relation Age of Onset  . COPD Mother   . Heart attack Father   . Heart attack Sister   . Hyperlipidemia      fhx  . Hypertension      fhx  . Cancer      ovarian/grandmother    Allergies  Allergen Reactions  . Codeine Phosphate     REACTION: unspecified  . Erythromycin   . Morphine Sulfate     REACTION: itching  . Oxycodone Hcl     REACTION: unspecified  . Oxycodone-Acetaminophen     REACTION: anaphylaxis  . Sulfamethoxazole     REACTION: rash    Current Outpatient Prescriptions on File Prior to Visit  Medication Sig Dispense Refill  . albuterol (PROAIR HFA) 108 (90 BASE) MCG/ACT inhaler Inhale 2 puffs into the lungs every 6 (six) hours as needed.        . ALPRAZolam (XANAX) 1 MG tablet Take 1 mg by mouth 2 (two) times daily as needed.       . Arginine 1000 MG TABS Take 2 tablets by mouth daily.        . Ascorbic Acid (VITAMIN C) 1000 MG tablet Take 1,000 mg by mouth daily.      Marland Kitchen aspirin 81 MG tablet Take 120 mg by mouth daily.       Marland Kitchen  atorvastatin (LIPITOR) 10 MG tablet Take 1 tablet (10 mg total) by mouth daily. Or as directed  90 tablet  3  . cetirizine (ZYRTEC) 10 MG tablet Take 10 mg by mouth as needed.        . Cholecalciferol (VITAMIN D3) 1000 UNITS CAPS Take 1 capsule by mouth daily.        Marland Kitchen co-enzyme Q-10 30 MG capsule Take 30 mg by mouth daily.        . cyanocobalamin 100 MCG tablet Take 100 mcg by mouth daily.        Marland Kitchen desvenlafaxine (PRISTIQ) 100 MG 24 hr tablet Take 100 mg by mouth daily.        . Garlic 100 MG TABS Take 1 tablet by mouth daily.        . insulin glargine (LANTUS SOLOSTAR) 100 UNIT/ML injection Inject 10 Units into the skin daily.  10 mL  5  . lamoTRIgine (LAMICTAL) 200 MG tablet Take 300 mg by mouth daily.       Marland Kitchen LEVOTHROID 50 MCG tablet TAKE 1 TABLET ONCE DAILY.  30 each  5  . lisinopril-hydrochlorothiazide (ZESTORETIC) 20-12.5 MG per tablet Take 0.5 tablets by mouth daily.  90 tablet  1  . Multiple Vitamin (MULTIVITAMIN) tablet  Take 1 tablet by mouth daily.        . Omega-3 350 MG CAPS Take 350 mg by mouth daily.        . ranitidine (ZANTAC) 75 MG tablet Take 75 mg by mouth daily.       Marland Kitchen DISCONTD: azelastine (ASTELIN) 137 MCG/SPRAY nasal spray 1 spray by Nasal route 2 (two) times daily. Use in each nostril as directed       . DISCONTD: budesonide-formoterol (SYMBICORT) 160-4.5 MCG/ACT inhaler Inhale 2 puffs into the lungs 2 (two) times daily.        Marland Kitchen DISCONTD: diphenhydrAMINE (SOMINEX) 25 MG tablet Take 25 mg by mouth at bedtime as needed.        Marland Kitchen DISCONTD: fluticasone (FLONASE) 50 MCG/ACT nasal spray 2 sprays by Nasal route daily.        Marland Kitchen DISCONTD: temazepam (RESTORIL) 30 MG capsule Take 30 mg by mouth at bedtime as needed.        BP 120/70  Temp(Src) 98.7 F (37.1 C) (Oral)  Wt 193 lb 8 oz (87.771 kg)chart    Objective:   Physical Exam  Constitutional: She is oriented to person, place, and time. She appears well-developed and well-nourished.  HENT:  Right Ear: External ear normal.  Left Ear: External ear normal.  Nose: Nose normal.  Mouth/Throat: Oropharynx is clear and moist.  Neck: Normal range of motion. Neck supple.  Cardiovascular: Normal rate, regular rhythm and normal heart sounds.   Pulmonary/Chest: Effort normal.  Abdominal: Soft. She exhibits no mass. There is tenderness. There is no rebound and no guarding.       Right and left upper quadrant abdominal tenderness. No rebound tenderness or guarding. Hyperactive bowel sounds  Musculoskeletal: Normal range of motion.  Neurological: She is alert and oriented to person, place, and time.  Skin: Skin is warm and dry.  Psychiatric: She has a normal mood and affect.          Assessment & Plan:  Assessment: Exposure to Giardia, abdominal pain, diarrhea  Plan: Stool culture sent to assess for Giardia. We'll follow the patient in culture results. Treat appropriately thereafter.

## 2012-05-15 ENCOUNTER — Telehealth: Payer: Self-pay | Admitting: Internal Medicine

## 2012-05-15 NOTE — Telephone Encounter (Signed)
Please contact [facility] incorrect specimen for  Ova and Parasite Exam was sent to them. Requesting to be contacted

## 2012-05-16 ENCOUNTER — Telehealth: Payer: Self-pay

## 2012-05-16 NOTE — Telephone Encounter (Signed)
Call from [facility]. Pt needs to submit another stool sample in the yellow top specimen bottle

## 2012-05-16 NOTE — Telephone Encounter (Signed)
Left detailed message to notify pt to come by the office to pick up another specimen pack for stool cx. Advised her to call with questions or concerns

## 2012-05-20 ENCOUNTER — Telehealth: Payer: Self-pay | Admitting: *Deleted

## 2012-05-20 NOTE — Telephone Encounter (Signed)
Called the patient and left a message. Have not heard back. Need to give her a different container to collect stool in.

## 2012-06-04 ENCOUNTER — Other Ambulatory Visit: Payer: Self-pay | Admitting: Internal Medicine

## 2012-07-11 ENCOUNTER — Other Ambulatory Visit: Payer: PRIVATE HEALTH INSURANCE

## 2012-07-18 ENCOUNTER — Ambulatory Visit: Payer: PRIVATE HEALTH INSURANCE | Admitting: Internal Medicine

## 2012-08-11 ENCOUNTER — Other Ambulatory Visit: Payer: PRIVATE HEALTH INSURANCE

## 2012-08-11 ENCOUNTER — Other Ambulatory Visit: Payer: Self-pay | Admitting: Internal Medicine

## 2012-08-18 ENCOUNTER — Ambulatory Visit: Payer: PRIVATE HEALTH INSURANCE | Admitting: Internal Medicine

## 2012-09-23 ENCOUNTER — Other Ambulatory Visit: Payer: Self-pay | Admitting: Internal Medicine

## 2012-12-08 ENCOUNTER — Other Ambulatory Visit: Payer: Self-pay | Admitting: *Deleted

## 2012-12-08 MED ORDER — LEVOTHYROXINE SODIUM 50 MCG PO TABS: 50.0000 ug | ORAL_TABLET | Freq: Every day | ORAL | Status: DC

## 2012-12-30 ENCOUNTER — Other Ambulatory Visit: Payer: Self-pay | Admitting: *Deleted

## 2012-12-30 MED ORDER — ALBUTEROL SULFATE HFA 108 (90 BASE) MCG/ACT IN AERS: 2.0000 | INHALATION_SPRAY | Freq: Four times a day (QID) | RESPIRATORY_TRACT | Status: DC | PRN

## 2012-12-31 LAB — HM DIABETES EYE EXAM

## 2013-01-26 ENCOUNTER — Other Ambulatory Visit: Payer: Self-pay | Admitting: *Deleted

## 2013-01-26 MED ORDER — INSULIN GLARGINE 100 UNIT/ML ~~LOC~~ SOLN: 10.0000 [IU] | Freq: Every day | SUBCUTANEOUS | Status: DC

## 2013-03-27 ENCOUNTER — Telehealth: Payer: Self-pay | Admitting: *Deleted

## 2013-03-27 ENCOUNTER — Ambulatory Visit: Payer: PRIVATE HEALTH INSURANCE | Admitting: Internal Medicine

## 2013-03-27 NOTE — Telephone Encounter (Signed)
Message copied by Jacqualyn Posey on Fri Mar 27, 2013  3:26 PM ------      Message from: Lindley Magnus      Created: Thu Mar 26, 2013  3:54 PM       Call patient. It's time for her blood work.      ----- Message -----         From: SYSTEM         Sent: 03/25/2013  12:05 AM           To: Lindley Magnus, MD                   ------

## 2013-03-27 NOTE — Telephone Encounter (Signed)
Please call pt and schedule a physical labs

## 2013-04-01 NOTE — Telephone Encounter (Signed)
Done kh

## 2013-04-10 ENCOUNTER — Other Ambulatory Visit: Payer: Self-pay | Admitting: Internal Medicine

## 2013-04-13 ENCOUNTER — Other Ambulatory Visit: Payer: PRIVATE HEALTH INSURANCE

## 2013-04-30 ENCOUNTER — Other Ambulatory Visit: Payer: Self-pay | Admitting: Internal Medicine

## 2013-04-30 ENCOUNTER — Other Ambulatory Visit: Payer: Self-pay

## 2013-05-08 ENCOUNTER — Other Ambulatory Visit: Payer: Self-pay | Admitting: *Deleted

## 2013-05-08 DIAGNOSIS — E785 Hyperlipidemia, unspecified: Secondary | ICD-10-CM

## 2013-05-08 MED ORDER — ATORVASTATIN CALCIUM 10 MG PO TABS: 10.0000 mg | ORAL_TABLET | Freq: Every day | ORAL | Status: DC

## 2013-05-08 MED ORDER — ALBUTEROL SULFATE HFA 108 (90 BASE) MCG/ACT IN AERS: INHALATION_SPRAY | RESPIRATORY_TRACT | Status: DC

## 2013-05-15 ENCOUNTER — Ambulatory Visit: Payer: PRIVATE HEALTH INSURANCE | Admitting: Internal Medicine

## 2013-05-22 ENCOUNTER — Other Ambulatory Visit (INDEPENDENT_AMBULATORY_CARE_PROVIDER_SITE_OTHER): Payer: Medicare Other

## 2013-05-22 DIAGNOSIS — E119 Type 2 diabetes mellitus without complications: Secondary | ICD-10-CM

## 2013-05-22 DIAGNOSIS — I10 Essential (primary) hypertension: Secondary | ICD-10-CM

## 2013-05-22 LAB — HEPATIC FUNCTION PANEL
AST: 21 U/L (ref 0–37)
Albumin: 4.1 g/dL (ref 3.5–5.2)
Total Bilirubin: 0.7 mg/dL (ref 0.3–1.2)

## 2013-05-22 LAB — LIPID PANEL
HDL: 46.5 mg/dL (ref >39.00)
LDL Cholesterol: 66 mg/dL (ref 0–99)
Total CHOL/HDL Ratio: 3
Triglycerides: 138 mg/dL (ref 0.0–149.0)
VLDL: 27.6 mg/dL (ref 0.0–40.0)

## 2013-05-22 LAB — BASIC METABOLIC PANEL
BUN: 16 mg/dL (ref 6–23)
CO2: 28 mEq/L (ref 19–32)
GFR: 47.68 mL/min — ABNORMAL LOW (ref >60.00)
Glucose, Bld: 101 mg/dL — ABNORMAL HIGH (ref 70–99)
Potassium: 3.8 mEq/L (ref 3.5–5.1)
Sodium: 139 mEq/L (ref 135–145)

## 2013-06-12 ENCOUNTER — Ambulatory Visit: Payer: Self-pay | Admitting: Internal Medicine

## 2013-06-15 ENCOUNTER — Other Ambulatory Visit: Payer: Self-pay | Admitting: Internal Medicine

## 2013-07-07 ENCOUNTER — Encounter: Payer: Self-pay | Admitting: Internal Medicine

## 2013-07-07 ENCOUNTER — Ambulatory Visit: Payer: Self-pay | Admitting: Internal Medicine

## 2013-07-07 ENCOUNTER — Ambulatory Visit (INDEPENDENT_AMBULATORY_CARE_PROVIDER_SITE_OTHER): Payer: Medicare Other | Admitting: Internal Medicine

## 2013-07-07 VITALS — BP 132/94 | HR 76 | Temp 98.0°F | Wt 219.0 lb

## 2013-07-07 DIAGNOSIS — E119 Type 2 diabetes mellitus without complications: Secondary | ICD-10-CM

## 2013-07-07 DIAGNOSIS — F329 Major depressive disorder, single episode, unspecified: Secondary | ICD-10-CM

## 2013-07-07 DIAGNOSIS — E785 Hyperlipidemia, unspecified: Secondary | ICD-10-CM

## 2013-07-07 DIAGNOSIS — E039 Hypothyroidism, unspecified: Secondary | ICD-10-CM

## 2013-07-07 DIAGNOSIS — Z2911 Encounter for prophylactic immunotherapy for respiratory syncytial virus (RSV): Secondary | ICD-10-CM

## 2013-07-07 DIAGNOSIS — F3289 Other specified depressive episodes: Secondary | ICD-10-CM

## 2013-07-07 DIAGNOSIS — E1159 Type 2 diabetes mellitus with other circulatory complications: Secondary | ICD-10-CM

## 2013-07-07 NOTE — Progress Notes (Signed)
Patient ID: Meredith Hayden, female   DOB: 05/29/1950, 63 y.o.   MRN: [id number]  DM- weight is up, CBGs are up- currently on lantus Lab Results  Component Value Date   HGBA1C 7.0* 05/22/2013    htn- no home bps  Depression- struggling with this disease. She is followed in psychiatry regularly. Considering ECT therapy.  Hypothyroid-  Lab Results  Component Value Date   TSH 4.80 01/12/2011   Reviewed past medical history, medications, family history, social history.  Review of systems: Admits to fatigue. She does get some enjoyment out of her puppies. She denies chest pain, shortness of breath, PND. No other specific complaints in a complete review of systems.  Examination: Review vital signs. Obese female in no acute distress. Neck is supple. Chest clear to auscultation. Cardiac exam S1-S2 are regular. Abdominal exam obese, and bowel sounds, soft. Extremities no edema. Neurologic exam she is alert. Psych: She does have a flat affect.

## 2013-07-08 NOTE — Assessment & Plan Note (Signed)
She is currently struggling. She'll follow up with psychiatry. They are considering ECT.

## 2013-07-08 NOTE — Assessment & Plan Note (Signed)
Adequate control. Continue current medications. 

## 2013-07-08 NOTE — Assessment & Plan Note (Signed)
Last time I checked her TSH was normal. She tells me that her psychiatrist is regularly checking her TSH.

## 2013-07-08 NOTE — Assessment & Plan Note (Signed)
A1c is not is poorly controlled as I would have that. Advised diet and exercise.

## 2013-07-09 ENCOUNTER — Other Ambulatory Visit: Payer: Self-pay

## 2013-07-14 ENCOUNTER — Other Ambulatory Visit: Payer: Self-pay | Admitting: Internal Medicine

## 2013-07-16 ENCOUNTER — Other Ambulatory Visit: Payer: Self-pay | Admitting: Internal Medicine

## 2013-08-07 ENCOUNTER — Other Ambulatory Visit: Payer: Self-pay | Admitting: Internal Medicine

## 2013-08-17 ENCOUNTER — Other Ambulatory Visit: Payer: Self-pay | Admitting: Internal Medicine

## 2013-11-02 ENCOUNTER — Other Ambulatory Visit (INDEPENDENT_AMBULATORY_CARE_PROVIDER_SITE_OTHER): Payer: Medicare Other

## 2013-11-02 DIAGNOSIS — E1159 Type 2 diabetes mellitus with other circulatory complications: Secondary | ICD-10-CM

## 2013-11-02 LAB — BASIC METABOLIC PANEL WITH GFR
BUN: 20 mg/dL (ref 6–23)
CO2: 31 meq/L (ref 19–32)
Calcium: 9.3 mg/dL (ref 8.4–10.5)
Chloride: 99 meq/L (ref 96–112)
Creatinine, Ser: 1.3 mg/dL — ABNORMAL HIGH (ref 0.4–1.2)
GFR: 43.85 mL/min — ABNORMAL LOW
Glucose, Bld: 134 mg/dL — ABNORMAL HIGH (ref 70–99)
Potassium: 3.7 meq/L (ref 3.5–5.1)
Sodium: 138 meq/L (ref 135–145)

## 2013-11-02 LAB — LIPID PANEL
HDL: 41.6 mg/dL (ref >39.00)
Total CHOL/HDL Ratio: 4
Triglycerides: 407 mg/dL — ABNORMAL HIGH (ref 0.0–149.0)
VLDL: 81.4 mg/dL — ABNORMAL HIGH (ref 0.0–40.0)

## 2013-11-02 LAB — MICROALBUMIN / CREATININE URINE RATIO
Creatinine,U: 99.2 mg/dL
Microalb Creat Ratio: 0.2 mg/g (ref 0.0–30.0)
Microalb, Ur: 0.2 mg/dL (ref 0.0–1.9)

## 2013-11-02 LAB — TSH: TSH: 3.11 u[IU]/mL (ref 0.35–5.50)

## 2013-11-02 LAB — HEMOGLOBIN A1C: Hgb A1c MFr Bld: 8 % — ABNORMAL HIGH (ref 4.6–6.5)

## 2013-11-02 LAB — HEPATIC FUNCTION PANEL
ALT: 18 U/L (ref 0–35)
Total Bilirubin: 0.5 mg/dL (ref 0.3–1.2)

## 2013-11-02 LAB — CBC
HCT: 38.7 % (ref 36.0–46.0)
MCV: 91.1 fl (ref 78.0–100.0)
Platelets: 264 10*3/uL (ref 150.0–400.0)
RBC: 4.24 Mil/uL (ref 3.87–5.11)
WBC: 8.9 10*3/uL (ref 4.5–10.5)

## 2013-11-02 LAB — LDL CHOLESTEROL, DIRECT: Direct LDL: 77.1 mg/dL

## 2013-11-05 ENCOUNTER — Other Ambulatory Visit: Payer: Self-pay

## 2013-11-09 ENCOUNTER — Ambulatory Visit: Payer: Medicare Other | Admitting: Internal Medicine

## 2013-11-30 LAB — HM DIABETES FOOT EXAM

## 2013-12-07 ENCOUNTER — Telehealth: Payer: Self-pay

## 2013-12-07 NOTE — Telephone Encounter (Signed)
Spoke with pt and instructed her to call the office to schedule her mammogram based on the [employer] project. Pt verbalized understanding.

## 2013-12-09 ENCOUNTER — Ambulatory Visit (INDEPENDENT_AMBULATORY_CARE_PROVIDER_SITE_OTHER): Payer: Medicare Other | Admitting: Internal Medicine

## 2013-12-09 ENCOUNTER — Encounter: Payer: Self-pay | Admitting: Internal Medicine

## 2013-12-09 VITALS — BP 142/98 | HR 88 | Temp 98.4°F | Ht 62.0 in | Wt 226.0 lb

## 2013-12-09 DIAGNOSIS — F329 Major depressive disorder, single episode, unspecified: Secondary | ICD-10-CM

## 2013-12-09 DIAGNOSIS — E785 Hyperlipidemia, unspecified: Secondary | ICD-10-CM

## 2013-12-09 DIAGNOSIS — E119 Type 2 diabetes mellitus without complications: Secondary | ICD-10-CM

## 2013-12-09 DIAGNOSIS — N959 Unspecified menopausal and perimenopausal disorder: Secondary | ICD-10-CM

## 2013-12-09 DIAGNOSIS — I10 Essential (primary) hypertension: Secondary | ICD-10-CM

## 2013-12-09 DIAGNOSIS — Z23 Encounter for immunization: Secondary | ICD-10-CM

## 2013-12-09 DIAGNOSIS — F32A Depression, unspecified: Secondary | ICD-10-CM

## 2013-12-09 DIAGNOSIS — F3289 Other specified depressive episodes: Secondary | ICD-10-CM

## 2013-12-09 NOTE — Patient Instructions (Signed)
Monitor your blood pressure at home. Call if BP is consistently greater than 140/90 Monitor your blood sugars at home.

## 2013-12-09 NOTE — Progress Notes (Signed)
Pre visit review using our clinic review tool, if applicable. No additional management support is needed unless otherwise documented below in the visit note. 

## 2013-12-09 NOTE — Progress Notes (Signed)
patient comes in for followup of multiple medical problems including type 2 diabetes, hyperlipidemia, hypertension. The patient does not check blood sugar or blood pressure at home. The patetient does not follow an exercise or diet program. The patient denies any polyuria, polydipsia.  In the past the patient has gone to diabetic treatment center. The patient is tolerating medications  Without difficulty. The patient does admit to medication compliance.   Past Medical History  Diagnosis Date  . Allergy   . Anxiety   . Asthma   . Depression   . Diabetes mellitus   . Hyperlipidemia   . Hypertension   . IBS (irritable bowel syndrome)   . Thyroid disease   . Arthritis   . OSA (obstructive sleep apnea)     History   Social History  . Marital Status: Married    Spouse Name: N/A    Number of Children: N/A  . Years of Education: N/A   Occupational History  . Not on file.   Social History Main Topics  . Smoking status: Never Smoker   . Smokeless tobacco: Not on file  . Alcohol Use:   . Drug Use:   . Sexual Activity:    Other Topics Concern  . Not on file   Social History Narrative  . No narrative on file    Past Surgical History  Procedure Laterality Date  . Partial colectomy    . Abdominal hysterectomy    . Total knee arthroplasty    . Oophorectomy      Family History  Problem Relation Age of Onset  . COPD Mother   . Heart attack Father   . Heart attack Sister   . Hyperlipidemia      fhx  . Hypertension      fhx  . Cancer      ovarian/grandmother    Allergies  Allergen Reactions  . Codeine Phosphate     REACTION: unspecified  . Erythromycin   . Morphine Sulfate     REACTION: itching  . Oxycodone Hcl     REACTION: unspecified  . Oxycodone-Acetaminophen     REACTION: anaphylaxis  . Sulfamethoxazole     REACTION: rash    Current Outpatient Prescriptions on File Prior to Visit  Medication Sig Dispense Refill  . ALPRAZolam (XANAX) 1 MG tablet Take 1  mg by mouth. Take 1-2 tabs up to 6 times daily      . Ascorbic Acid (VITAMIN C) 1000 MG tablet Take 1,000 mg by mouth daily.      Marland Kitchen aspirin 81 MG tablet Take 120 mg by mouth daily.       Marland Kitchen atorvastatin (LIPITOR) 10 MG tablet TAKE 1 TABLET DAILY OR AS DIRECTED.  90 tablet  1  . B-D ULTRAFINE III SHORT PEN 31G X 8 MM MISC USE ONCE DAILY.  100 each  PRN  . Calcium Carbonate-Vit D-Min (CALCIUM 1200 PO) Take 1 tablet by mouth daily.      . cetirizine (ZYRTEC) 10 MG tablet Take 10 mg by mouth as needed.        . Cholecalciferol (VITAMIN D3) 1000 UNITS CAPS Take 1 capsule by mouth daily.        Marland Kitchen co-enzyme Q-10 30 MG capsule Take 30 mg by mouth daily.        . cyanocobalamin 100 MCG tablet Take 100 mcg by mouth daily.        Marland Kitchen desvenlafaxine (PRISTIQ) 100 MG 24 hr tablet Take 100 mg by mouth  daily.        . Garlic 100 MG TABS Take 1 tablet by mouth daily.        Marland Kitchen lamoTRIgine (LAMICTAL) 200 MG tablet Take 400 mg by mouth daily.      Marland Kitchen LANTUS SOLOSTAR 100 UNIT/ML SOPN INJECT 15 UNITS SUBCUTANEOUSLY DAILY.  15 mL  5  . levothyroxine (SYNTHROID, LEVOTHROID) 50 MCG tablet TAKE 1 TABLET ONCE DAILY.  30 tablet  5  . lisinopril-hydrochlorothiazide (PRINZIDE,ZESTORETIC) 20-12.5 MG per tablet TAKE 1 TABLET ONCE DAILY.  30 tablet  5  . Multiple Vitamin (MULTIVITAMIN) tablet Take 1 tablet by mouth daily.        . Omega-3 350 MG CAPS Take 350 mg by mouth daily.        Marland Kitchen PROAIR HFA 108 (90 BASE) MCG/ACT inhaler USE 2 PUFFS EVERY 6 HOURS AS NEEDED.  8.5 g  5  . ranitidine (ZANTAC) 75 MG tablet Take 75 mg by mouth daily.       . [DISCONTINUED] azelastine (ASTELIN) 137 MCG/SPRAY nasal spray 1 spray by Nasal route 2 (two) times daily. Use in each nostril as directed       . [DISCONTINUED] budesonide-formoterol (SYMBICORT) 160-4.5 MCG/ACT inhaler Inhale 2 puffs into the lungs 2 (two) times daily.        . [DISCONTINUED] diphenhydrAMINE (SOMINEX) 25 MG tablet Take 25 mg by mouth at bedtime as needed.        .  [DISCONTINUED] fluticasone (FLONASE) 50 MCG/ACT nasal spray 2 sprays by Nasal route daily.        . [DISCONTINUED] temazepam (RESTORIL) 30 MG capsule Take 30 mg by mouth at bedtime as needed.       No current facility-administered medications on file prior to visit.     patient denies chest pain, shortness of breath, orthopnea. Denies lower extremity edema, abdominal pain, change in appetite, change in bowel movements. Patient denies rashes, musculoskeletal complaints. No other specific complaints in a complete review of systems.   BP 142/98  Pulse 88  Temp(Src) 98.4 F (36.9 C) (Oral)  Ht 5\' 2"  (1.575 m)  Wt 226 lb (102.513 kg)  BMI 41.33 kg/m2  Well-developed well-nourished female in no acute distress. HEENT exam atraumatic, normocephalic, extraocular muscles are intact. Neck is supple. No jugular venous distention no thyromegaly. Chest clear to auscultation without increased work of breathing. Cardiac exam S1 and S2 are regular. Abdominal exam active bowel sounds, soft, nontender. Extremities no edema. Neurologic exam she is alert without any motor sensory deficits. Gait is normal.

## 2013-12-09 NOTE — Assessment & Plan Note (Signed)
Not as well controlled Likely related to poor diet and weight. Discussed need for diet and exercise and weigh tloss

## 2013-12-09 NOTE — Assessment & Plan Note (Signed)
Discussed She will monitor at home Goal bp<130/85

## 2013-12-11 NOTE — Assessment & Plan Note (Signed)
Her main issue is psychiatric in nature- She will f/u with psych

## 2013-12-11 NOTE — Assessment & Plan Note (Signed)
Reviewed labs Needs to lose weight. Discussed aggressive weight loss

## 2013-12-21 ENCOUNTER — Ambulatory Visit (INDEPENDENT_AMBULATORY_CARE_PROVIDER_SITE_OTHER): Payer: Medicare Other | Admitting: Obstetrics and Gynecology

## 2013-12-21 ENCOUNTER — Encounter: Payer: Self-pay | Admitting: Obstetrics and Gynecology

## 2013-12-21 ENCOUNTER — Encounter: Payer: Self-pay | Admitting: Internal Medicine

## 2013-12-21 VITALS — BP 122/84 | HR 90 | Ht 61.0 in | Wt 223.5 lb

## 2013-12-21 DIAGNOSIS — M8580 Other specified disorders of bone density and structure, unspecified site: Secondary | ICD-10-CM

## 2013-12-21 DIAGNOSIS — M899 Disorder of bone, unspecified: Secondary | ICD-10-CM

## 2013-12-21 DIAGNOSIS — Z01419 Encounter for gynecological examination (general) (routine) without abnormal findings: Secondary | ICD-10-CM

## 2013-12-21 DIAGNOSIS — N9089 Other specified noninflammatory disorders of vulva and perineum: Secondary | ICD-10-CM

## 2013-12-21 NOTE — Progress Notes (Signed)
Patient ID: Meredith Hayden, female   DOB: 1950/07/13, 63 y.o.   MRN: [id number] GYNECOLOGY PROBLEM VISIT  PCP:   Birdie Sons, MD Psychiatrist:  Dr. Evelene Croon. Orthopedist:  Dr. Mckinley Jewel  HPI: 63 y.o.   Married  Caucasian  female   G2P2002 with Patient's last menstrual period was 12/31/1993.   here for   Evaluation of vaginal dryness. Painful intercourse.  Used vaginal estrogen cream in the past and did well with it in the past.   Colonoscopy 5 years ago - polyps.  Due in 2016. Tetanus 2008 Bone density 10 years ago - osteopenia. Takes calcium and vit D  supplement 1000 mg total throughout the day.    GYNECOLOGIC HISTORY: Patient's last menstrual period was 12/31/1993. Sexually active:  None x 10 years Partner preference: female Contraception: TAH   Menopausal hormone therapy:  None currently.  Used an estrogen  patch until 2007 or 2008.   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 Nicholas Lose History of abnormal pap smear:  Yes.  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    OB History   Grav Para Term Preterm Abortions TAB SAB Ect Mult Living   2 2 2       2          Family History  Problem Relation Age of Onset  . COPD Mother   . Heart attack Father   . Hypertension Father   . Heart attack Sister   . Diabetes Sister   . Hypertension Sister   . Hyperlipidemia      fhx  . Hypertension      fhx  . Cancer      ovarian/grandmother  . Ovarian cancer Maternal Aunt   . Hypertension Sister     Patient Active Problem List   Diagnosis Date Noted  . IRRITABLE BOWEL SYNDROME 03/05/2008  . DISORDER, BIPOLAR NEC 09/19/2007  . SLEEP APNEA 09/19/2007  . HYPOTHYROIDISM 08/26/2007  . OSTEOARTHRITIS 08/26/2007  . DIABETES  MELLITUS, TYPE II 10/31/2006  . HYPERLIPIDEMIA 10/31/2006  . OBESITY, MORBID 10/31/2006  . ANXIETY 10/31/2006  . DEPRESSION 10/31/2006  . HYPERTENSION 10/31/2006  . ALLERGIC RHINITIS 10/31/2006  . ASTHMA 10/31/2006  . DIVERTICULOSIS, COLON 10/31/2006  . COLONIC POLYPS 11/29/2005    Past Medical History  Diagnosis Date  . Allergy   . Anxiety   . Asthma   . Depression   . Diabetes mellitus   . Hyperlipidemia   . Hypertension   . IBS (irritable bowel syndrome)   . Thyroid disease   . Arthritis   . OSA (obstructive sleep apnea)   . Dyspareunia     vaginal dryness  . Blood transfusion without reported diagnosis 2002    with knee replacement  . Fibroid     reason for hysterectomy  . STD (sexually transmitted disease)     Hx HPV  . Osteopenia 2009    Past Surgical History  Procedure Laterality Date  . Partial colectomy      -RSO  . Total knee arthroplasty Bilateral 2002, 2004  . Oophorectomy    . Abdominal hysterectomy  1995    TAH/LSO--Dr. Nicholas Lose  . Rotator  cuff repair Left   . Cesarean section  1978  . Foot surgery Bilateral   . Nasal sinus surgery      ALLERGIES: Codeine phosphate; Erythromycin; Morphine sulfate; Oxycodone hcl; Oxycodone-acetaminophen; and Sulfamethoxazole  Current Outpatient Prescriptions  Medication Sig Dispense Refill  . ALPRAZolam (XANAX) 1 MG tablet Take 1 mg by mouth. Take 1-2 tabs up to 6 times daily      . Ascorbic Acid (VITAMIN C) 1000 MG tablet Take 1,000 mg by mouth daily.      Marland Kitchen aspirin 81 MG tablet Take 120 mg by mouth daily.       Marland Kitchen atorvastatin (LIPITOR) 10 MG tablet TAKE 1 TABLET DAILY OR AS DIRECTED.  90 tablet  1  . B-D ULTRAFINE III SHORT PEN 31G X 8 MM MISC USE ONCE DAILY.  100 each  PRN  . Calcium Carbonate-Vit D-Min (CALCIUM 1200 PO) Take 1 tablet by mouth daily.      . cetirizine (ZYRTEC) 10 MG tablet Take 10 mg by mouth as needed.        . Cholecalciferol (VITAMIN D3) 1000 UNITS CAPS Take 1 capsule by mouth daily.         Marland Kitchen co-enzyme Q-10 30 MG capsule Take 30 mg by mouth daily.        . cyanocobalamin 100 MCG tablet Take 100 mcg by mouth daily.        Marland Kitchen desvenlafaxine (PRISTIQ) 100 MG 24 hr tablet Take 100 mg by mouth daily.        . Garlic 100 MG TABS Take 1 tablet by mouth daily.        Marland Kitchen lamoTRIgine (LAMICTAL) 200 MG tablet Take 400 mg by mouth daily.      Marland Kitchen LANTUS SOLOSTAR 100 UNIT/ML SOPN INJECT 15 UNITS SUBCUTANEOUSLY DAILY.  15 mL  5  . levothyroxine (SYNTHROID, LEVOTHROID) 50 MCG tablet TAKE 1 TABLET ONCE DAILY.  30 tablet  5  . lisinopril-hydrochlorothiazide (PRINZIDE,ZESTORETIC) 20-12.5 MG per tablet TAKE 1 TABLET ONCE DAILY.  30 tablet  5  . Multiple Vitamin (MULTIVITAMIN) tablet Take 1 tablet by mouth daily.        . Omega-3 350 MG CAPS Take 350 mg by mouth daily.        Marland Kitchen PROAIR HFA 108 (90 BASE) MCG/ACT inhaler USE 2 PUFFS EVERY 6 HOURS AS NEEDED.  8.5 g  5  . ranitidine (ZANTAC) 75 MG tablet Take 75 mg by mouth daily.       . [DISCONTINUED] azelastine (ASTELIN) 137 MCG/SPRAY nasal spray 1 spray by Nasal route 2 (two) times daily. Use in each nostril as directed       . [DISCONTINUED] budesonide-formoterol (SYMBICORT) 160-4.5 MCG/ACT inhaler Inhale 2 puffs into the lungs 2 (two) times daily.        . [DISCONTINUED] diphenhydrAMINE (SOMINEX) 25 MG tablet Take 25 mg by mouth at bedtime as needed.        . [DISCONTINUED] fluticasone (FLONASE) 50 MCG/ACT nasal spray 2 sprays by Nasal route daily.        . [DISCONTINUED] temazepam (RESTORIL) 30 MG capsule Take 30 mg by mouth at bedtime as needed.       No current facility-administered medications for this visit.     ROS:  Pertinent items are noted in HPI.  SOCIAL HISTORY:  Disabled.  Bipolar disorder.  Retired from [employer].   PHYSICAL EXAMINATION:    BP 122/84  Pulse 90  Ht 5\' 1"  (1.549 m)  Wt 223 lb 8  oz (101.379 kg)  BMI 42.25 kg/m2  LMP 12/31/1993   Wt Readings from Last 3 Encounters:  12/21/13 223 lb 8 oz (101.379 kg)  12/09/13 226  lb (102.513 kg)  07/07/13 219 lb (99.338 kg)     Ht Readings from Last 3 Encounters:  12/21/13 5\' 1"  (1.549 m)  12/09/13 5\' 2"  (1.575 m)  09/19/11 5\' 2"  (1.575 m)    General appearance: alert, cooperative and appears stated age Head: Normocephalic, without obvious abnormality, atraumatic Neck: no adenopathy, supple, symmetrical, trachea midline and thyroid not enlarged, symmetric, no tenderness/mass/nodules Lungs: clear to auscultation bilaterally Breasts: Inspection negative, No nipple retraction or dimpling, No nipple discharge or bleeding, No axillary or supraclavicular adenopathy, Normal to palpation without dominant masses Heart: regular rate and rhythm Abdomen: vertical midline incision, soft, non-tender; no masses,  no organomegaly Extremities: extremities normal, atraumatic, no cyanosis or edema Skin: Skin color, texture, turgor normal. No rashes or lesions Lymph nodes: Cervical, supraclavicular, and axillary nodes normal. No abnormal inguinal nodes palpated Neurologic: Grossly normal  Pelvic: External genitalia:  Right medial labia with 2 x 1.5 cm deeply pigmented lesion with irregular vascular borders, nontender, nonblanching.  Fusion of the labia minora over the clitoral hood.  Whitish changes of the vulvar epithelium.  No ulceration.               Urethra:  normal appearing urethra with no masses, tenderness or lesions              Bartholins and Skenes: normal                 Vagina: normal appearing vagina with normal color and discharge, no lesions              Cervix:  absent              Pap and high risk HPV testing done: no.            Bimanual Exam:  Uterus:   absent                                      Adnexa:  no masses                                      Rectovaginal: Confirms                                      Anus:  normal sphincter tone, no lesions  ASSESSMENT  Status post TAH/LSO. Status post RSO and partial colectomy. Osteopenia. Atrophic vaginal  changes. 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  PLAN  [facility]   Office will schedule. Will prescribe vaginal estrogen after mammogram back and normal. Discussed weight bearing exercise and Calcium/Vit D with patient. Referral to Dermatology. Return annual and prn.    An After Visit Summary was printed and given to the patient.

## 2013-12-21 NOTE — Progress Notes (Signed)
Appointment made for Mammogram. Appointment made with patient in office for 01/19/14 at 3:00 patient agreeable to time/date for [facility] imaging   Appointment with Dr. Amy Swaziland at  [facility] for 01/08/14 at 11:40.

## 2013-12-21 NOTE — Patient Instructions (Signed)

## 2013-12-22 ENCOUNTER — Encounter: Payer: Self-pay | Admitting: Internal Medicine

## 2013-12-23 MED ORDER — ONETOUCH VERIO W/DEVICE KIT: 1.0000 | PACK | Freq: Every day | Status: DC

## 2013-12-23 MED ORDER — GLUCOSE BLOOD VI STRP: 1.0000 | ORAL_STRIP | Freq: Two times a day (BID) | Status: DC

## 2014-01-05 ENCOUNTER — Telehealth: Payer: Self-pay | Admitting: Internal Medicine

## 2014-01-05 MED ORDER — ONETOUCH DELICA LANCETS 33G MISC: 1.0000 | Freq: Two times a day (BID) | Status: DC

## 2014-01-05 NOTE — Telephone Encounter (Signed)
[employer] requesting new rx for [employer]

## 2014-01-05 NOTE — Telephone Encounter (Signed)
rx sent in electronically 

## 2014-01-19 ENCOUNTER — Other Ambulatory Visit: Payer: Medicare Other

## 2014-01-19 ENCOUNTER — Ambulatory Visit: Payer: Medicare Other

## 2014-01-21 ENCOUNTER — Other Ambulatory Visit: Payer: Self-pay | Admitting: Obstetrics and Gynecology

## 2014-01-21 DIAGNOSIS — Z1231 Encounter for screening mammogram for malignant neoplasm of breast: Secondary | ICD-10-CM

## 2014-01-30 ENCOUNTER — Other Ambulatory Visit: Payer: Self-pay | Admitting: Internal Medicine

## 2014-02-03 ENCOUNTER — Ambulatory Visit: Payer: Medicare Other

## 2014-02-03 ENCOUNTER — Other Ambulatory Visit: Payer: Medicare Other

## 2014-02-08 ENCOUNTER — Telehealth: Payer: Self-pay | Admitting: Internal Medicine

## 2014-02-08 NOTE — Telephone Encounter (Signed)
Pt is wanted to know if dr. Leanne Chang is recommended the mmr vaccine for his patients, pt states that because she was born before 1955 she wants to know if she should come in and get the vaccine.

## 2014-02-09 ENCOUNTER — Encounter: Payer: Self-pay | Admitting: Internal Medicine

## 2014-02-10 NOTE — Telephone Encounter (Signed)
Please schedule

## 2014-02-10 NOTE — Telephone Encounter (Signed)
yes

## 2014-02-15 NOTE — Telephone Encounter (Signed)
appt scheduled for pt.  

## 2014-02-16 ENCOUNTER — Ambulatory Visit: Payer: Medicare Other

## 2014-02-16 ENCOUNTER — Other Ambulatory Visit: Payer: Medicare Other

## 2014-02-22 ENCOUNTER — Ambulatory Visit: Payer: Medicare Other | Admitting: Internal Medicine

## 2014-02-26 ENCOUNTER — Other Ambulatory Visit: Payer: Self-pay | Admitting: Internal Medicine

## 2014-03-05 ENCOUNTER — Ambulatory Visit: Payer: Medicare Other

## 2014-03-05 ENCOUNTER — Other Ambulatory Visit: Payer: Medicare Other

## 2014-06-14 ENCOUNTER — Encounter: Payer: Self-pay | Admitting: Internal Medicine

## 2014-06-14 ENCOUNTER — Ambulatory Visit (INDEPENDENT_AMBULATORY_CARE_PROVIDER_SITE_OTHER): Payer: Medicare Other | Admitting: Internal Medicine

## 2014-06-14 VITALS — BP 134/90 | HR 88 | Temp 98.4°F | Ht 61.0 in | Wt 234.0 lb

## 2014-06-14 DIAGNOSIS — I10 Essential (primary) hypertension: Secondary | ICD-10-CM

## 2014-06-14 DIAGNOSIS — E039 Hypothyroidism, unspecified: Secondary | ICD-10-CM

## 2014-06-14 DIAGNOSIS — E119 Type 2 diabetes mellitus without complications: Secondary | ICD-10-CM

## 2014-06-14 LAB — HEMOGLOBIN A1C: HEMOGLOBIN A1C: 9.8 % — AB (ref 4.6–6.5)

## 2014-06-14 NOTE — Progress Notes (Signed)
dm2- not eating well She is not following a diet plan or exercise in.  Lipids- she is tolerating Lipitor and is compliant with medications.  htn- tolerating meds  Depression- she struggles. Dr. Toy Care- she sees her regularly. Has considered ECT  Past Medical History  Diagnosis Date  . Allergy   . Anxiety   . Asthma   . Depression   . Diabetes mellitus   . Hyperlipidemia   . Hypertension   . IBS (irritable bowel syndrome)   . Thyroid disease   . Arthritis   . OSA (obstructive sleep apnea)   . Dyspareunia     vaginal dryness  . Blood transfusion without reported diagnosis 2002    with knee replacement  . Fibroid     reason for hysterectomy  . STD (sexually transmitted disease)     Hx HPV  . Osteopenia 2009    History   Social History  . Marital Status: Married    Spouse Name: N/A    Number of Children: N/A  . Years of Education: N/A   Occupational History  . Not on file.   Social History Main Topics  . Smoking status: Never Smoker   . Smokeless tobacco: Not on file  . Alcohol Use: No  . Drug Use: Not on file  . Sexual Activity: Yes    Partners: Male    Birth Control/ Protection: Surgical     Comment: TAH   Other Topics Concern  . Not on file   Social History Narrative  . No narrative on file    Past Surgical History  Procedure Laterality Date  . Partial colectomy      -RSO  . Total knee arthroplasty Bilateral 2002, 2004  . Oophorectomy    . Abdominal hysterectomy  1995    TAH/LSO--Dr. Ubaldo Glassing  . Rotator cuff repair Left   . Cesarean section  1978  . Foot surgery Bilateral   . Nasal sinus surgery      Family History  Problem Relation Age of Onset  . COPD Mother   . Heart attack Father   . Hypertension Father   . Heart attack Sister   . Diabetes Sister   . Hypertension Sister   . Hyperlipidemia      fhx  . Hypertension      fhx  . Cancer      ovarian/grandmother  . Ovarian cancer Maternal Aunt   . Hypertension Sister     Allergies   Allergen Reactions  . Codeine Phosphate     REACTION: unspecified  . Erythromycin   . Morphine Sulfate     REACTION: itching  . Oxycodone Hcl     REACTION: unspecified  . Oxycodone-Acetaminophen     REACTION: anaphylaxis  . Sulfamethoxazole     REACTION: rash    Current Outpatient Prescriptions on File Prior to Visit  Medication Sig Dispense Refill  . ALPRAZolam (XANAX) 1 MG tablet Take 1 mg by mouth. Take 1-2 tabs up to 6 times daily      . Ascorbic Acid (VITAMIN C) 1000 MG tablet Take 1,000 mg by mouth daily.      Marland Kitchen aspirin 81 MG tablet Take 120 mg by mouth daily.       Marland Kitchen atorvastatin (LIPITOR) 10 MG tablet TAKE 1 TABLET DAILY OR AS DIRECTED.  90 tablet  2  . B-D ULTRAFINE III SHORT PEN 31G X 8 MM MISC USE ONCE DAILY.  100 each  PRN  . Blood Glucose Monitoring Suppl (  ONETOUCH VERIO) W/DEVICE KIT 1 each by Does not apply route daily.  1 kit  0  . Calcium Carbonate-Vit D-Min (CALCIUM 1200 PO) Take 1 tablet by mouth daily.      . cetirizine (ZYRTEC) 10 MG tablet Take 10 mg by mouth as needed.        . Cholecalciferol (VITAMIN D3) 1000 UNITS CAPS Take 1 capsule by mouth daily.        Marland Kitchen co-enzyme Q-10 30 MG capsule Take 30 mg by mouth daily.        . cyanocobalamin 100 MCG tablet Take 100 mcg by mouth daily.        Marland Kitchen desvenlafaxine (PRISTIQ) 100 MG 24 hr tablet Take 100 mg by mouth daily.        . Garlic 035 MG TABS Take 1 tablet by mouth daily.        Marland Kitchen glucose blood (ONETOUCH VERIO) test strip 1 each by Other route 2 (two) times daily. Use as instructed  100 each  11  . lamoTRIgine (LAMICTAL) 200 MG tablet Take 400 mg by mouth daily.      Marland Kitchen LANTUS SOLOSTAR 100 UNIT/ML SOPN INJECT 15 UNITS SUBCUTANEOUSLY DAILY.  15 mL  5  . levothyroxine (SYNTHROID, LEVOTHROID) 50 MCG tablet TAKE 1 TABLET ONCE DAILY.  30 tablet  5  . lisinopril-hydrochlorothiazide (PRINZIDE,ZESTORETIC) 20-12.5 MG per tablet TAKE 1 TABLET ONCE DAILY.  90 tablet  2  . Multiple Vitamin (MULTIVITAMIN) tablet Take 1  tablet by mouth daily.        . Omega-3 350 MG CAPS Take 350 mg by mouth daily.        [name] DELICA LANCETS 00X MISC 1 each by Does not apply route 2 (two) times daily.  100 each  3  . PROAIR HFA 108 (90 BASE) MCG/ACT inhaler USE 2 PUFFS EVERY 6 HOURS AS NEEDED.  8.5 g  5  . [DISCONTINUED] azelastine (ASTELIN) 137 MCG/SPRAY nasal spray 1 spray by Nasal route 2 (two) times daily. Use in each nostril as directed       . [DISCONTINUED] budesonide-formoterol (SYMBICORT) 160-4.5 MCG/ACT inhaler Inhale 2 puffs into the lungs 2 (two) times daily.        . [DISCONTINUED] diphenhydrAMINE (SOMINEX) 25 MG tablet Take 25 mg by mouth at bedtime as needed.        . [DISCONTINUED] fluticasone (FLONASE) 50 MCG/ACT nasal spray 2 sprays by Nasal route daily.        . [DISCONTINUED] temazepam (RESTORIL) 30 MG capsule Take 30 mg by mouth at bedtime as needed.       No current facility-administered medications on file prior to visit.     patient denies chest pain, shortness of breath, orthopnea. Denies lower extremity edema, abdominal pain, change in appetite, change in bowel movements. Patient denies rashes, musculoskeletal complaints. No other specific complaints in a complete review of systems.   BP 134/90  Pulse 88  Temp(Src) 98.4 F (36.9 C) (Oral)  Ht $R'5\' 1"'zb$  (1.549 m)  Wt 234 lb (106.142 kg)  BMI 44.24 kg/m2  LMP 12/31/1993  obese female in no acute distress. HEENT exam atraumatic, normocephalic, extraocular muscles are intact. Neck is supple. No jugular venous distention no thyromegaly. Chest clear to auscultation without increased work of breathing. Cardiac exam S1 and S2 are regular. Abdominal exam active bowel sounds, soft, nontender. Extremities no edema. Neurologic exam she is alert without any motor sensory deficits. Gait is normal.  HYPERTENSION BP Readings from Last 3  Encounters:  06/14/14 134/90  12/21/13 122/84  12/09/13 142/98   Fair control. Continue current medications. I suspect  she could discontinue a significant number of her medications with aggressive weight loss. I have advised her to follow a low-fat diet. She should follow a low calorie diet. She should exercise at least 30 minutes daily. Initial goal weight loss should be less than 180 pounds.  DIABETES MELLITUS, TYPE II Lab Results  Component Value Date   HGBA1C 9.8* 06/14/2014   It is not unexpected that her A1c is way out of control. She has not follow an exercise or diet plan. I will give her connected with her endocrinologist. She should follow a low fat diet, low calorie diet, exercise regularly.  HYPOTHYROIDISM Lab Results  Component Value Date   TSH 3.11 11/02/2013   Previously controlled.

## 2014-06-14 NOTE — Progress Notes (Signed)
Pre visit review using our clinic review tool, if applicable. No additional management support is needed unless otherwise documented below in the visit note. 

## 2014-06-18 NOTE — Assessment & Plan Note (Signed)
Lab Results  Component Value Date   HGBA1C 9.8* 06/14/2014   It is not unexpected that her A1c is way out of control. She has not follow an exercise or diet plan. I will give her connected with her endocrinologist. She should follow a low fat diet, low calorie diet, exercise regularly.

## 2014-06-18 NOTE — Assessment & Plan Note (Signed)
Lab Results  Component Value Date   TSH 3.11 11/02/2013   Previously controlled.

## 2014-06-18 NOTE — Assessment & Plan Note (Signed)
BP Readings from Last 3 Encounters:  06/14/14 134/90  12/21/13 122/84  12/09/13 142/98   Fair control. Continue current medications. I suspect she could discontinue a significant number of her medications with aggressive weight loss. I have advised her to follow a low-fat diet. She should follow a low calorie diet. She should exercise at least 30 minutes daily. Initial goal weight loss should be less than 180 pounds.

## 2014-06-22 ENCOUNTER — Other Ambulatory Visit: Payer: Self-pay | Admitting: Internal Medicine

## 2014-06-22 DIAGNOSIS — E119 Type 2 diabetes mellitus without complications: Secondary | ICD-10-CM

## 2014-07-01 ENCOUNTER — Ambulatory Visit: Payer: Medicare Other | Admitting: Internal Medicine

## 2014-07-13 ENCOUNTER — Ambulatory Visit: Payer: Medicare Other | Admitting: Internal Medicine

## 2014-07-23 ENCOUNTER — Ambulatory Visit (INDEPENDENT_AMBULATORY_CARE_PROVIDER_SITE_OTHER): Payer: Medicare Other | Admitting: Internal Medicine

## 2014-07-23 ENCOUNTER — Encounter: Payer: Self-pay | Admitting: Internal Medicine

## 2014-07-23 VITALS — BP 138/94 | HR 84 | Temp 98.9°F | Resp 12 | Ht 61.5 in | Wt 229.0 lb

## 2014-07-23 DIAGNOSIS — E119 Type 2 diabetes mellitus without complications: Secondary | ICD-10-CM

## 2014-07-23 MED ORDER — GLIPIZIDE ER 10 MG PO TB24: 10.0000 mg | ORAL_TABLET | Freq: Every day | ORAL | Status: DC

## 2014-07-23 MED ORDER — INSULIN PEN NEEDLE 31G X 5 MM MISC: Status: DC

## 2014-07-23 MED ORDER — INSULIN GLARGINE 100 UNIT/ML SOLOSTAR PEN: 20.0000 [IU] | PEN_INJECTOR | Freq: Every day | SUBCUTANEOUS | Status: DC

## 2014-07-23 NOTE — Patient Instructions (Addendum)
Please increase Lantus to 20 units at bedtime. Start Glipizide XL 10 mg daily in am.  Start checking sugars 2x a day and write them down.  When injecting insulin:  Inject in the abdomen  Rotate the injection sites around the belly button  Change needle for each injection  Keep needle in for 10 sec after last unit of insulin in  Keep the insulin in use out of the fridge  Please return in 1 month with your sugar log.   PATIENT INSTRUCTIONS FOR TYPE 2 DIABETES:  **Please join MyChart!** - see attached instructions about how to join if you have not done so already.  DIET AND EXERCISE Diet and exercise is an important part of diabetic treatment.  We recommended aerobic exercise in the form of brisk walking (working between 40-60% of maximal aerobic capacity, similar to brisk walking) for 150 minutes per week (such as 30 minutes five days per week) along with 3 times per week performing 'resistance' training (using various gauge rubber tubes with handles) 5-10 exercises involving the major muscle groups (upper body, lower body and core) performing 10-15 repetitions (or near fatigue) each exercise. Start at half the above goal but build slowly to reach the above goals. If limited by weight, joint pain, or disability, we recommend daily walking in a swimming pool with water up to waist to reduce pressure from joints while allow for adequate exercise.    BLOOD GLUCOSES Monitoring your blood glucoses is important for continued management of your diabetes. Please check your blood glucoses 2-4 times a day: fasting, before meals and at bedtime (you can rotate these measurements - e.g. one day check before the 3 meals, the next day check before 2 of the meals and before bedtime, etc.).   HYPOGLYCEMIA (low blood sugar) Hypoglycemia is usually a reaction to not eating, exercising, or taking too much insulin/ other diabetes drugs.  Symptoms include tremors, sweating, hunger, confusion, headache,  etc. Treat IMMEDIATELY with 15 grams of Carbs:   4 glucose tablets    cup regular juice/soda   2 tablespoons raisins   4 teaspoons sugar   1 tablespoon honey Recheck blood glucose in 15 mins and repeat above if still symptomatic/blood glucose <100.  RECOMMENDATIONS TO REDUCE YOUR RISK OF DIABETIC COMPLICATIONS: * Take your prescribed MEDICATION(S) * Follow a DIABETIC diet: Complex carbs, fiber rich foods, (monounsaturated and polyunsaturated) fats * AVOID saturated/trans fats, high fat foods, >2,300 mg salt per day. * EXERCISE at least 5 times a week for 30 minutes or preferably daily.  * DO NOT SMOKE OR DRINK more than 1 drink a day. * Check your FEET every day. Do not wear tightfitting shoes. Contact us if you develop an ulcer * See your EYE doctor once a year or more if needed * Get a FLU shot once a year * Get a PNEUMONIA vaccine once before and once after age 10 years  GOALS:  * Your Hemoglobin A1c of <7%  * fasting sugars need to be <130 * after meals sugars need to be <180 (2h after you start eating) * Your Systolic BP should be 856 or lower  * Your Diastolic BP should be 80 or lower  * Your HDL (Good Cholesterol) should be 40 or higher  * Your LDL (Bad Cholesterol) should be 100 or lower. * Your Triglycerides should be 150 or lower  * Your Urine microalbumin (kidney function) should be <30 * Your Body Mass Index should be 25 or lower  We will be glad to help you achieve these goals. Our telephone number is: [phone].

## 2014-07-23 NOTE — Progress Notes (Signed)
Patient ID: Meredith Hayden, female   DOB: 05-Dec-1950, 64 y.o.   MRN: [id number]  HPI: Meredith Hayden is a 64 y.o.-year-old female, referred by her PCP, Dr. 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  Patient has been diagnosed with diabetes in ~2005; she started insulin in 2013.  Last hemoglobin A1c was: Lab Results  Component Value Date   HGBA1C 9.8* 06/14/2014   HGBA1C 8.0* 11/02/2013   HGBA1C 7.0* 05/22/2013   Pt is on a regimen of: - Lantus 15 units qhs << 30 units (when she lost 50 lbs) - sometimes forgets the insulin (pens) - Turmeric curcumin 500 mg 2x a day - Cinnamon + chromium (2000+400) 2x a day She was on Metformin >> N/V/D/AP She tried Glyburide >> no SEs She tried Januvia.  Pt checks her sugars 0-2x a day and they are: 189-259 before, no pattern. No lows. Lowest sugar was 189; she has hypoglycemia awareness at 80.  Highest sugar was 259.  Pt's meals are - no regular pattern - she wakes up at different times of the day: - Breakfast: yoghurt + kind bar + almonds - Lunch: apple + almond butter - Dinner: biggest meal of the day: meat + veggies ; sometimes just beans + kale + mushrooms - Snacks: anything; several times a day: almonds, sandwich, crackers  - She has CKD, last BUN/creatinine:  Lab Results  Component Value Date   BUN 20 11/02/2013   CREATININE 1.3* 11/02/2013  On Lisinopril. - last set of lipids: Lab Results  Component Value Date   CHOL 155 11/02/2013   HDL 41.60 11/02/2013   LDLCALC 66 05/22/2013   LDLDIRECT 77.1 11/02/2013   TRIG 407.0* 11/02/2013   CHOLHDL 4 11/02/2013  On Lipitor, Garlic - last eye exam was in 2014. + mild OU DR.  - no numbness and tingling in her feet.  Pt has FH of DM in mother, aunt, sister.  ROS: Constitutional: + weight gain, no fatigue, + both subjective hyperthermia/hypothermia, + poor sleep Eyes:+ blurry vision, no xerophthalmia ENT: no sore throat, no nodules  palpated in throat, no dysphagia/odynophagia, no hoarseness Cardiovascular: no CP/SOB/palpitations/leg swelling Respiratory: no cough/+ SOB with exertion/ + wheezing (asthma) Gastrointestinal: no N/V/+ D/+ C/+ heartburn Musculoskeletal: + both: muscle/joint aches Skin: no rashes, + hair loss, + easy bruising Neurological: no tremors/numbness/tingling/dizziness, + HA Psychiatric: + depression/+ anxiety + low libido  Past Medical History  Diagnosis Date  . Allergy   . Anxiety   . Asthma   . Depression   . Diabetes mellitus   . Hyperlipidemia   . Hypertension   . IBS (irritable bowel syndrome)   . Thyroid disease   . Arthritis   . OSA (obstructive sleep apnea)   . Dyspareunia     vaginal dryness  . Blood transfusion without reported diagnosis 2002    with knee replacement  . Fibroid     reason for hysterectomy  . STD (sexually transmitted disease)     Hx HPV  . Osteopenia 2009   Past Surgical History  Procedure Laterality Date  . Partial colectomy      -RSO  . Total knee arthroplasty Bilateral 2002, 2004  . Oophorectomy    . Abdominal hysterectomy  1995    TAH/LSO--Dr. Ubaldo Glassing  . Rotator cuff repair Left   . Cesarean section  1978  . Foot surgery Bilateral   . Nasal sinus surgery     History   Social History  .  Marital Status: Married    Spouse Name: N/A    Number of Children: 2   Occupational History  . RN - disabled   Social History Main Topics  . Smoking status: Never Smoker   . Smokeless tobacco: Not on file  . Alcohol Use: Red wine, 1-2 drinks every 1-3 mo  . Drug Use: Not on file  . Sexual Activity: Yes    Partners: Male    Birth Control/ Protection: Surgical     Comment: TAH   Current Outpatient Prescriptions on File Prior to Visit  Medication Sig Dispense Refill  . ALPRAZolam (XANAX) 1 MG tablet Take 1 mg by mouth. Take 1-2 tabs up to 6 times daily      . Ascorbic Acid (VITAMIN C) 1000 MG tablet Take 1,000 mg by mouth daily.      Marland Kitchen aspirin 81  MG tablet Take 120 mg by mouth daily.       Marland Kitchen atorvastatin (LIPITOR) 10 MG tablet TAKE 1 TABLET DAILY OR AS DIRECTED.  90 tablet  2  . Blood Glucose Monitoring Suppl (ONETOUCH VERIO) W/DEVICE KIT 1 each by Does not apply route daily.  1 kit  0  . Calcium Carbonate-Vit D-Min (CALCIUM 1200 PO) Take 1 tablet by mouth daily.      . cetirizine (ZYRTEC) 10 MG tablet Take 10 mg by mouth as needed.        . Cholecalciferol (VITAMIN D3) 1000 UNITS CAPS Take 1 capsule by mouth daily.        Marland Kitchen co-enzyme Q-10 30 MG capsule Take 30 mg by mouth daily.        . cyanocobalamin 100 MCG tablet Take 100 mcg by mouth daily.        Marland Kitchen desvenlafaxine (PRISTIQ) 100 MG 24 hr tablet Take 100 mg by mouth daily.        . Garlic 433 MG TABS Take 1 tablet by mouth daily.        Marland Kitchen glucose blood (ONETOUCH VERIO) test strip 1 each by Other route 2 (two) times daily. Use as instructed  100 each  11  . lamoTRIgine (LAMICTAL) 200 MG tablet Take 400 mg by mouth daily.      Marland Kitchen levothyroxine (SYNTHROID, LEVOTHROID) 50 MCG tablet TAKE 1 TABLET ONCE DAILY.  30 tablet  5  . lisinopril-hydrochlorothiazide (PRINZIDE,ZESTORETIC) 20-12.5 MG per tablet TAKE 1 TABLET ONCE DAILY.  90 tablet  2  . Multiple Vitamin (MULTIVITAMIN) tablet Take 1 tablet by mouth daily.        . Omega-3 350 MG CAPS Take 350 mg by mouth daily.        Marland Kitchen omeprazole (PRILOSEC) 20 MG capsule Take 20 mg by mouth daily.      [name] DELICA LANCETS 29J MISC 1 each by Does not apply route 2 (two) times daily.  100 each  3  . PROAIR HFA 108 (90 BASE) MCG/ACT inhaler USE 2 PUFFS EVERY 6 HOURS AS NEEDED.  8.5 g  5  . [DISCONTINUED] azelastine (ASTELIN) 137 MCG/SPRAY nasal spray 1 spray by Nasal route 2 (two) times daily. Use in each nostril as directed       . [DISCONTINUED] budesonide-formoterol (SYMBICORT) 160-4.5 MCG/ACT inhaler Inhale 2 puffs into the lungs 2 (two) times daily.        . [DISCONTINUED] diphenhydrAMINE (SOMINEX) 25 MG tablet Take 25 mg by mouth at bedtime  as needed.        . [DISCONTINUED] fluticasone (FLONASE) 50 MCG/ACT nasal spray  2 sprays by Nasal route daily.        . [DISCONTINUED] temazepam (RESTORIL) 30 MG capsule Take 30 mg by mouth at bedtime as needed.       No current facility-administered medications on file prior to visit.   Allergies  Allergen Reactions  . Codeine Phosphate     REACTION: unspecified  . Erythromycin   . Morphine Sulfate     REACTION: itching  . Oxycodone Hcl     REACTION: unspecified  . Oxycodone-Acetaminophen     REACTION: anaphylaxis  . Sulfamethoxazole     REACTION: rash   Family History  Problem Relation Age of Onset  . COPD Mother   . Heart attack Father   . Hypertension Father   . Heart attack Sister   . Diabetes Sister   . Hypertension Sister   . Hyperlipidemia      fhx  . Hypertension      fhx  . Cancer      ovarian/grandmother  . Ovarian cancer Maternal Aunt   . Hypertension Sister    PE: BP 138/94  Pulse 84  Temp(Src) 98.9 F (37.2 C) (Oral)  Resp 12  Ht 5' 1.5" (1.562 m)  Wt 229 lb (103.874 kg)  BMI 42.57 kg/m2  SpO2 97%  LMP 12/31/1993 Wt Readings from Last 3 Encounters:  07/23/14 229 lb (103.874 kg)  06/14/14 234 lb (106.142 kg)  12/21/13 223 lb 8 oz (101.379 kg)   Constitutional: overweight, in NAD Eyes: PERRLA, EOMI, no exophthalmos ENT: moist mucous membranes, no thyromegaly, no cervical lymphadenopathy Cardiovascular: RRR, No MRG Respiratory: CTA B Gastrointestinal: abdomen soft, NT, ND, BS+ Musculoskeletal: no deformities, strength intact in all 4 Skin: moist, warm, no rashes Neurological: no tremor with outstretched hands, DTR normal in all 4  ASSESSMENT: 1. DM2, insulin-dependent, uncontrolled, with complications - DR OU - CKD - PN  PLAN:  1. Patient with long-standing, uncontrolled diabetes, on basal insulin regimen, which became insufficient. She admits that the greatest barrier to good sugar control is her random circadian rhythm, with different  sleep times and waking times and inconsistent meals. She also forgets the insulin at times and forgets to check sugars. She also has depression, and has food binges when this is worse. - We discussed about options for treatment, and I suggested to:  Patient Instructions  Please increase Lantus to 20 units at bedtime. Start Glipizide XL 10 mg daily in am.  Start checking sugars 2x a day and write them down.  When injecting insulin:  Inject in the abdomen  Rotate the injection sites around the belly button  Change needle for each injection  Keep needle in for 10 sec after last unit of insulin in  Keep the insulin in use out of the fridge  Please return in 1 month with your sugar log.   - Strongly advised her to start checking sugars at different times of the day - check 2 times a day, rotating checks - given sugar log and advised how to fill it and to bring it at next appt  - given foot care handout and explained the principles  - given instructions for hypoglycemia management "15-15 rule"  - advised for yearly eye exams - Return to clinic in 1 mo with sugar log   Pt's care team: - cardiology: Dr Johnsie Cancel - psychiatry: Dr Toy Care - orthopedics: Dr Percell Miller - GI: Dr Sharlett Iles - Gyn: Dr Quincy Simmonds - Dermatology: Dr Martinique - Podiatry: Dr Blenda Mounts

## 2014-07-28 LAB — HM DIABETES EYE EXAM

## 2014-08-06 ENCOUNTER — Encounter: Payer: Self-pay | Admitting: Family Medicine

## 2014-08-16 ENCOUNTER — Encounter: Payer: Self-pay | Admitting: Internal Medicine

## 2014-08-30 ENCOUNTER — Other Ambulatory Visit: Payer: Self-pay | Admitting: Internal Medicine

## 2014-09-02 ENCOUNTER — Ambulatory Visit (INDEPENDENT_AMBULATORY_CARE_PROVIDER_SITE_OTHER): Payer: Medicare Other | Admitting: Internal Medicine

## 2014-09-02 ENCOUNTER — Encounter: Payer: Self-pay | Admitting: Internal Medicine

## 2014-09-02 VITALS — BP 104/70 | HR 84 | Temp 99.0°F | Resp 12 | Wt 229.0 lb

## 2014-09-02 DIAGNOSIS — E1139 Type 2 diabetes mellitus with other diabetic ophthalmic complication: Secondary | ICD-10-CM

## 2014-09-02 DIAGNOSIS — E11319 Type 2 diabetes mellitus with unspecified diabetic retinopathy without macular edema: Secondary | ICD-10-CM

## 2014-09-02 DIAGNOSIS — E039 Hypothyroidism, unspecified: Secondary | ICD-10-CM

## 2014-09-02 MED ORDER — LEVOTHYROXINE SODIUM 50 MCG PO TABS: 50.0000 ug | ORAL_TABLET | Freq: Every day | ORAL | Status: DC

## 2014-09-02 NOTE — Patient Instructions (Signed)
Please continue Lantus 20 units at bedtime. Continue Glipizide XL 10 mg daily in am.  Start checking sugars 2x a day (check later in the day, too) and write them down - bring log at next visit.  Please return in 1 month with your sugar log.

## 2014-09-02 NOTE — Progress Notes (Signed)
Patient ID: Meredith Hayden, female   DOB: 02/25/1950, 64 y.o.   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  She has been very depressed this mo. She sees Dr. Toy Care.  DM2: Last hemoglobin A1c was: Lab Results  Component Value Date   HGBA1C 9.8* 06/14/2014   HGBA1C 8.0* 11/02/2013   HGBA1C 7.0* 05/22/2013   Pt is on a regimen of: - Lantus 20 units hs - sometimes forgets the insulin (pens) - Glipizide XL 10 mg in am - added 07/23/2014 - Turmeric curcumin 500 mg 2x a day - Cinnamon + chromium (2000+400) 2x a day She was on Metformin >> N/V/D/AP She tried Glyburide >> no SEs She tried Januvia.  Pt checks her sugars 0-2x a day and they are improved (at last visit: 189-259, no pattern). - before b'fast: 129-168 - 2h after b'fast: when we started tx: 195-225 - lunch: 125 - 2h after lunch: 170 - dinner: 161 - 2h after dinner: 194 - bedtime: n/c No lows. Lowest sugar was 189 >> 125; she has hypoglycemia awareness at 80.  Highest sugar was 259 >> 194.  Pt's meals are - no regular pattern - she wakes up at different times of the day: - Breakfast: yoghurt + kind bar + almonds - Lunch: apple + almond butter - Dinner: biggest meal of the day: meat + veggies ; sometimes just beans + kale + mushrooms - Snacks: anything; several times a day: almonds, sandwich, crackers  - She has CKD, last BUN/creatinine:  Lab Results  Component Value Date   BUN 20 11/02/2013   CREATININE 1.3* 11/02/2013  On Lisinopril. - last set of lipids: Lab Results  Component Value Date   CHOL 155 11/02/2013   HDL 41.60 11/02/2013   LDLCALC 66 05/22/2013   LDLDIRECT 77.1 11/02/2013   TRIG 407.0* 11/02/2013   CHOLHDL 4 11/02/2013  On Lipitor, Garlic - last eye exam was in 2014. + mild OU DR.  - no numbness and tingling in her feet.  Hypothyroidism: - dx  several years ago - on LT4 50 mcg daily as she wakes up - takes calcium and Protonix later - needs refill of LT4 - last Ts: Lab Results  Component Value Date   TSH 3.11 11/02/2013   TSH 4.80 01/12/2011   TSH 4.32 09/12/2007   I reviewed pt's medications, allergies, PMH, social hx, family hx and no changes required, except as mentioned above.  ROS: Constitutional: no  weight gain, no fatigue, + subjective hyperthermia, + poor sleep Eyes:+ blurry vision, no xerophthalmia ENT: no sore throat, no nodules palpated in throat, no dysphagia/odynophagia, no hoarseness Cardiovascular: no CP/SOB/palpitations/leg swelling Respiratory: no cough/SOB/ + wheezing (asthma) Gastrointestinal: no N/V/D/C/heartburn Musculoskeletal: no muscle/joint aches Skin: no rashes Neurological: no tremors/numbness/tingling/dizziness + depression  PE: BP 104/70  Pulse 84  Temp(Src) 99 F (37.2 C) (Oral)  Resp 12  Wt 229 lb (103.874 kg)  SpO2 96%  LMP 12/31/1993 Wt Readings from Last 3 Encounters:  09/02/14 229 lb (103.874 kg)  07/23/14 229 lb (103.874 kg)  06/14/14 234 lb (106.142 kg)   Constitutional: overweight, in NAD Eyes: PERRLA, EOMI, no exophthalmos ENT: moist mucous membranes, no thyromegaly, no cervical lymphadenopathy Cardiovascular: RRR, No MRG Respiratory: CTA B Gastrointestinal: abdomen soft, NT, ND, BS+ Musculoskeletal: no deformities, strength intact in all 4 Skin: moist, warm, no rashes Neurological: no tremor  with outstretched hands, DTR normal in all 4  ASSESSMENT: 1. 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 Hypothyroidism  Pt's care team: - cardiology: Dr Johnsie Cancel - psychiatry: Dr Toy Care - orthopedics: Dr Percell Miller - GI: Dr Sharlett Iles - Gyn: Dr Quincy Simmonds - Dermatology: Dr Martinique - Podiatry: Dr Blenda Mounts  PLAN:  1. Patient with long-standing, uncontrolled diabetes, on basal insulin + oral med (glipizide), added at last visit. Her sugars improved  since last visit, but unclear control later in the day as she did not check. She also has depression and eats erratically, which she considers the biggest impediment in controlling her DM, and I agree. She also wakes up late, sometimes in the afternoon. At least, she tells me she took her medicines as she should in the last month. - We discussed about options for treatment, and we decided to continue current regimen for now:  Patient Instructions  Please continue Lantus 20 units at bedtime. Continue Glipizide XL 10 mg daily in am. Start checking sugars 2x a day (check later in the day, too) and write them down - bring log at next visit. Please return in 1 month with your sugar log.  - continue checking sugars at different times of the day - check 2 times a day, rotating checks - up to date with eye exams - will check HbA1c at next visit - Return to clinic in 1 mo with sugar log   2. Hypothyroidism - refilled LT4 for 1 mo >> will check TFTs at next visit - I advised her to take the thyroid hormone EVERY DAY, with water, >30 minutes before breakfast, separated by >4 hours from acid reflux medications, calcium, iron, multivitamins.

## 2014-09-17 ENCOUNTER — Other Ambulatory Visit: Payer: Self-pay | Admitting: Internal Medicine

## 2014-10-14 ENCOUNTER — Ambulatory Visit: Payer: Medicare Other | Admitting: Internal Medicine

## 2014-11-01 ENCOUNTER — Encounter: Payer: Self-pay | Admitting: Internal Medicine

## 2014-11-10 ENCOUNTER — Ambulatory Visit: Payer: Medicare Other | Admitting: Internal Medicine

## 2014-11-11 ENCOUNTER — Ambulatory Visit: Payer: Medicare Other | Admitting: Internal Medicine

## 2014-11-18 ENCOUNTER — Ambulatory Visit: Payer: Medicare Other | Admitting: Internal Medicine

## 2014-11-23 ENCOUNTER — Other Ambulatory Visit: Payer: Self-pay | Admitting: *Deleted

## 2014-11-23 MED ORDER — GLIPIZIDE ER 10 MG PO TB24: 10.0000 mg | ORAL_TABLET | Freq: Every day | ORAL | Status: DC

## 2014-11-29 ENCOUNTER — Other Ambulatory Visit: Payer: Self-pay | Admitting: Internal Medicine

## 2014-12-09 ENCOUNTER — Other Ambulatory Visit: Payer: Self-pay | Admitting: Internal Medicine

## 2014-12-09 ENCOUNTER — Other Ambulatory Visit: Payer: Self-pay | Admitting: *Deleted

## 2014-12-09 ENCOUNTER — Ambulatory Visit (INDEPENDENT_AMBULATORY_CARE_PROVIDER_SITE_OTHER): Payer: Medicare Other | Admitting: Internal Medicine

## 2014-12-09 ENCOUNTER — Encounter: Payer: Self-pay | Admitting: Internal Medicine

## 2014-12-09 VITALS — BP 124/82 | HR 75 | Temp 98.9°F | Resp 12 | Wt 239.0 lb

## 2014-12-09 DIAGNOSIS — E785 Hyperlipidemia, unspecified: Secondary | ICD-10-CM

## 2014-12-09 DIAGNOSIS — E039 Hypothyroidism, unspecified: Secondary | ICD-10-CM

## 2014-12-09 DIAGNOSIS — E11319 Type 2 diabetes mellitus with unspecified diabetic retinopathy without macular edema: Secondary | ICD-10-CM

## 2014-12-09 MED ORDER — INSULIN GLARGINE 100 UNIT/ML SOLOSTAR PEN: 30.0000 [IU] | PEN_INJECTOR | Freq: Every day | SUBCUTANEOUS | Status: DC

## 2014-12-09 NOTE — Progress Notes (Signed)
Patient ID: Meredith Hayden, female   DOB: 1950-02-26, 64 y.o.   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 Last visit 3 mo ago.   Her depression increased. She is seeing Dr Toy Care >> referred to a psychiatric neurologist [facility]) to be evaluated for ECT. She is having confusion, memory issues, disorientation.   DM2: Last hemoglobin A1c was: Lab Results  Component Value Date   HGBA1C 9.8* 06/14/2014   HGBA1C 8.0* 11/02/2013   HGBA1C 7.0* 05/22/2013   Pt is on a regimen of: - Lantus 20 units hs - sometimes forgets the insulin (pens)  - Glipizide XL 10 mg in am - added 07/23/2014 - Turmeric curcumin 500 mg 2x a day - Cinnamon + chromium (2000+400) 2x a day She was on Metformin >> N/V/D/AP She tried Glyburide >> no SEs She tried Januvia.  Pt checks her sugars 0-2x a day and they are worse - since she could not follow her diet as she was very depressed:  - before b'fast: 129-168 >> 158-213, 286 - 2h after b'fast: when we started tx: 195-225 >> n/c - lunch: 125 >> 250-265 - 2h after lunch: 170 >> n/c - dinner: 161 >> 203-231 - 2h after dinner: 194 >> n/c - bedtime: n/c No lows. Lowest sugar was 189 >> 125 >> 158; she has hypoglycemia awareness at 80.  Highest sugar was 259 >> 194 >> 286.  Pt's meals are - no regular pattern - she wakes up at different times of the day: - Breakfast: yoghurt + kind bar + almonds - Lunch: apple + almond butter - Dinner: biggest meal of the day: meat + veggies ; sometimes just beans + kale + mushrooms - Snacks: anything; several times a day: almonds, sandwich, crackers  - She has CKD, last BUN/creatinine:  Lab Results  Component Value Date   BUN 20 11/02/2013   CREATININE 1.3* 11/02/2013  On Lisinopril. - last set of lipids: Lab Results  Component Value Date   CHOL 155 11/02/2013   HDL  41.60 11/02/2013   LDLCALC 66 05/22/2013   LDLDIRECT 77.1 11/02/2013   TRIG 407.0* 11/02/2013   CHOLHDL 4 11/02/2013  On Lipitor, Garlic - last eye exam was in 2014. + mild OU DR.  - no numbness and tingling in her feet.  Hypothyroidism: - dx several years ago - on LT4 50 mcg daily as she wakes up - takes calcium and Protonix later  - last TSH: Lab Results  Component Value Date   TSH 3.11 11/02/2013   TSH 4.80 01/12/2011   TSH 4.32 09/12/2007   I reviewed pt's medications, allergies, PMH, social hx, family hx and no changes required as per my last visit update, except as mentioned above.  ROS: Constitutional: + weight gain, + fatigue, + subjective hyperthermia, + increased sleep - up to 20 hrs a day Eyes:+ blurry vision, no xerophthalmia ENT: no sore throat, no nodules palpated in throat, + dysphagia/no odynophagia, no hoarseness Cardiovascular: no CP/SOB/palpitations/leg swelling Respiratory: no cough/SOB/ + wheezing (asthma) Gastrointestinal: no N/V/D/+C/no heartburn Musculoskeletal: + muscle/+ joint aches Skin: no rashes, + easy bruising Neurological: no tremors/numbness/tingling/dizziness, + periods of confusion + depression  PE: BP 124/82 mmHg  Pulse 75  Temp(Src) 98.9 F (37.2 C) (Oral)  Resp 12  Wt 239 lb (108.41 kg)  SpO2 98%  LMP 12/31/1993 Wt Readings from Last 3  Encounters:  12/09/14 239 lb (108.41 kg)  09/02/14 229 lb (103.874 kg)  07/23/14 229 lb (103.874 kg)   Constitutional: overweight, in NAD Eyes: PERRLA, EOMI, no exophthalmos ENT: moist mucous membranes, no thyromegaly, no cervical lymphadenopathy Cardiovascular: RRR, No MRG Respiratory: CTA B Gastrointestinal: abdomen soft, NT, ND, BS+ Musculoskeletal: no deformities, strength intact in all 4 Skin: moist, warm, no rashes Neurological: no tremor with outstretched hands, DTR normal in all 4  ASSESSMENT: 1. 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  Hypothyroidism  Pt's care team: - cardiology: Dr Johnsie Cancel - psychiatry: Dr Toy Care - orthopedics: Dr Percell Miller - GI: Dr Sharlett Iles - Gyn: Dr Quincy Simmonds - Dermatology: Dr Martinique - Podiatry: Dr Blenda Mounts  PLAN:  1. Patient with long-standing, uncontrolled diabetes, on basal insulin + oral med (glipizide), with her control affected by her severe depression. Her sugars improved over the summer, but now worsened again 2/2 depression "It is difficult to care". She will have eval for ECT soon. - We discussed about options for treatment, and we decided to: Patient Instructions  Please increase Lantus to 30 units at bedtime. You can increase Lantus by 5 more units if the sugars in am are not <150 after 1 week. Continue Glipizide XL 10 mg in am.  Please return in 1.5 months with your sugar log.   Please stop at [facility] lab downstairs.  - continue checking sugars at different times of the day - check 2 times a day, rotating checks - needs new eye exam - will check HbA1c, Lipids today - Return to clinic in 1.5 mo with sugar log   2. Hypothyroidism - will check TFTs today - she certainly has some hypothyroid sxs, but the sxs are confounded by her depression - she knows to take the thyroid hormone every day, with water, >30 minutes before breakfast, separated by >4 hours from acid reflux medications, calcium, iron, multivitamins.  Component     Latest Ref Rng 12/09/2014  Sodium     135 - 145 mEq/L 138  Potassium     3.5 - 5.3 mEq/L 4.2  Chloride     96 - 112 mEq/L 97  CO2     19 - 32 mEq/L 33 (H)  Glucose     70 - 99 mg/dL 189 (H)  BUN     6 - 23 mg/dL 17  Creatinine     0.50 - 1.10 mg/dL 0.95  Total Bilirubin     0.2 - 1.2 mg/dL 0.3  Alkaline Phosphatase     39 - 117 U/L 76  AST     0 - 37 U/L 33  ALT     0 - 35 U/L 25  Total Protein     6.0 - 8.3 g/dL 6.5  Albumin     3.5 - 5.2 g/dL 4.3  Calcium     8.4 - 10.5 mg/dL 9.4  Cholesterol     0 - 200 mg/dL 154  Triglycerides     <150  mg/dL 284 (H)  HDL     >39 mg/dL 42  Total CHOL/HDL Ratio      3.7  VLDL     0 - 40 mg/dL 57 (H)  LDL (calc)     0 - 99 mg/dL 55  Hgb A1c MFr Bld     <5.7 % 10.0 (H)  Mean Plasma Glucose     <117 mg/dL 240 (H)  TSH     0.350 - 4.500  uIU/mL 1.610  Free T4     0.80 - 1.80 ng/dL 0.90  TFTs normal. HbA1c high, as expected.  TG high, but improved. LDL at goal.

## 2014-12-09 NOTE — Patient Instructions (Signed)
Please increase Lantus to 30 units at bedtime. You can increase Lantus by 5 more units if the sugars in am are not <150 after 1 week. Continue Glipizide XL 10 mg in am.  Please return in 1.5 months with your sugar log.   Please stop at [facility] lab downstairs.

## 2014-12-10 LAB — COMPREHENSIVE METABOLIC PANEL
ALBUMIN: 4.3 g/dL (ref 3.5–5.2)
ALT: 25 U/L (ref 0–35)
AST: 33 U/L (ref 0–37)
Alkaline Phosphatase: 76 U/L (ref 39–117)
BILIRUBIN TOTAL: 0.3 mg/dL (ref 0.2–1.2)
BUN: 17 mg/dL (ref 6–23)
CO2: 33 mEq/L — ABNORMAL HIGH (ref 19–32)
Calcium: 9.4 mg/dL (ref 8.4–10.5)
Chloride: 97 mEq/L (ref 96–112)
Creat: 0.95 mg/dL (ref 0.50–1.10)
GLUCOSE: 189 mg/dL — AB (ref 70–99)
POTASSIUM: 4.2 meq/L (ref 3.5–5.3)
SODIUM: 138 meq/L (ref 135–145)
TOTAL PROTEIN: 6.5 g/dL (ref 6.0–8.3)

## 2014-12-10 LAB — LIPID PANEL
Cholesterol: 154 mg/dL (ref 0–200)
HDL: 42 mg/dL (ref >39)
LDL CALC: 55 mg/dL (ref 0–99)
TRIGLYCERIDES: 284 mg/dL — AB (ref <150)
Total CHOL/HDL Ratio: 3.7 Ratio
VLDL: 57 mg/dL — AB (ref 0–40)

## 2014-12-10 LAB — HEMOGLOBIN A1C
HEMOGLOBIN A1C: 10 % — AB (ref <5.7)
MEAN PLASMA GLUCOSE: 240 mg/dL — AB (ref <117)

## 2014-12-10 LAB — T4, FREE: Free T4: 0.9 ng/dL (ref 0.80–1.80)

## 2014-12-10 LAB — TSH: TSH: 1.61 u[IU]/mL (ref 0.350–4.500)

## 2014-12-17 ENCOUNTER — Ambulatory Visit: Payer: Medicare Other | Admitting: Internal Medicine

## 2014-12-20 ENCOUNTER — Ambulatory Visit: Payer: Self-pay | Admitting: Psychiatry

## 2014-12-20 LAB — URINALYSIS, COMPLETE
BILIRUBIN, UR: NEGATIVE
Bacteria: NONE SEEN
Blood: NEGATIVE
Glucose,UR: NEGATIVE mg/dL (ref 0–75)
Hyaline Cast: 6
Ketone: NEGATIVE
NITRITE: NEGATIVE
Ph: 5 (ref 4.5–8.0)
Protein: NEGATIVE
SPECIFIC GRAVITY: 1.02 (ref 1.003–1.030)
WBC UR: 5 /HPF (ref 0–5)

## 2014-12-20 LAB — CBC
HCT: 41.9 % (ref 35.0–47.0)
HGB: 13.6 g/dL (ref 12.0–16.0)
MCH: 30.7 pg (ref 26.0–34.0)
MCHC: 32.6 g/dL (ref 32.0–36.0)
MCV: 94 fL (ref 80–100)
Platelet: 234 10*3/uL (ref 150–440)
RBC: 4.44 10*6/uL (ref 3.80–5.20)
RDW: 13.1 % (ref 11.5–14.5)
WBC: 9.1 10*3/uL (ref 3.6–11.0)

## 2014-12-20 LAB — BASIC METABOLIC PANEL
Anion Gap: 4 — ABNORMAL LOW (ref 7–16)
BUN: 15 mg/dL (ref 7–18)
Calcium, Total: 9.2 mg/dL (ref 8.5–10.1)
Chloride: 101 mmol/L (ref 98–107)
Co2: 33 mmol/L — ABNORMAL HIGH (ref 21–32)
Creatinine: 0.91 mg/dL (ref 0.60–1.30)
EGFR (African American): >60
GLUCOSE: 193 mg/dL — AB (ref 65–99)
Osmolality: 282 (ref 275–301)
Potassium: 4.2 mmol/L (ref 3.5–5.1)
Sodium: 138 mmol/L (ref 136–145)

## 2014-12-21 ENCOUNTER — Other Ambulatory Visit: Payer: Self-pay | Admitting: Internal Medicine

## 2014-12-22 ENCOUNTER — Other Ambulatory Visit: Payer: Self-pay | Admitting: Internal Medicine

## 2014-12-22 ENCOUNTER — Ambulatory Visit (INDEPENDENT_AMBULATORY_CARE_PROVIDER_SITE_OTHER): Payer: Medicare Other | Admitting: Family

## 2014-12-22 ENCOUNTER — Ambulatory Visit: Payer: Self-pay | Admitting: Psychiatry

## 2014-12-22 ENCOUNTER — Encounter: Payer: Self-pay | Admitting: Family

## 2014-12-22 VITALS — BP 142/80 | HR 94 | Temp 98.5°F | Resp 18 | Ht 61.5 in | Wt 237.2 lb

## 2014-12-22 DIAGNOSIS — F32A Depression, unspecified: Secondary | ICD-10-CM

## 2014-12-22 DIAGNOSIS — I10 Essential (primary) hypertension: Secondary | ICD-10-CM

## 2014-12-22 DIAGNOSIS — F329 Major depressive disorder, single episode, unspecified: Secondary | ICD-10-CM

## 2014-12-22 NOTE — Progress Notes (Signed)
Subjective:    Patient ID: Meredith Hayden, female    DOB: [birth year], 64 y.o.   MRN: [id number]  Chief Complaint  Patient presents with  . Establish Care    Needed a new PCP due to her other one leaving    HPI:  Meredith Hayden is a 64 y.o. female who presents today to establish and   1) Cognitive issues - Symptoms have gradually evolved over the last 3 months. Indicates increased difficulty with remembering things and has occasions of being disoriented to time. She is currently seeing Dr. Weber Cooks in [place] who is a neuropsychologist and Dr. Toy Care as her psychiatrist. She is scheduled to ECT on 12/28.  2) Hypertension - Currently maintained on lisinopril-HCTZ. Denies any recent changes to vision, chest pain or discomfort, shortness of breath, heart palpitations, or edema. Eye exam completed with diabetic eye exam..   BP Readings from Last 3 Encounters:  12/22/14 142/80  12/09/14 124/82  09/02/14 104/70    Current Outpatient Prescriptions on File Prior to Visit  Medication Sig Dispense Refill  . AFLURIA PRESERVATIVE FREE 0.5 ML SUSY   0  . ALPRAZolam (XANAX) 1 MG tablet Take 1 mg by mouth. Take 1-2 tabs up to 6 times daily    . Ascorbic Acid (VITAMIN C) 1000 MG tablet Take 1,000 mg by mouth daily.    Marland Kitchen aspirin 81 MG tablet Take 120 mg by mouth daily.     Marland Kitchen atorvastatin (LIPITOR) 10 MG tablet TAKE 1 TABLET DAILY OR AS DIRECTED. 90 tablet 1  . Blood Glucose Monitoring Suppl (ONETOUCH VERIO) W/DEVICE KIT 1 each by Does not apply route daily. 1 kit 0  . Calcium Carbonate-Vit D-Min (CALCIUM 1200 PO) Take 1 tablet by mouth daily.    . cetirizine (ZYRTEC) 10 MG tablet Take 10 mg by mouth as needed.      . Cholecalciferol (VITAMIN D3) 1000 UNITS CAPS Take 1 capsule by mouth daily.      Marland Kitchen co-enzyme Q-10 30 MG capsule Take 30 mg by mouth daily.      . cyanocobalamin 100 MCG tablet Take 100 mcg by mouth daily.      Marland Kitchen desvenlafaxine (PRISTIQ) 100 MG 24 hr tablet Take 100  mg by mouth daily.      . Garlic 224 MG TABS Take 1 tablet by mouth daily.      Marland Kitchen glucose blood (ONETOUCH VERIO) test strip 1 each by Other route 2 (two) times daily. Use as instructed 100 each 11  . Insulin Glargine (LANTUS SOLOSTAR) 100 UNIT/ML Solostar Pen Inject 30 Units into the skin at bedtime. 15 mL 2  . Insulin Pen Needle (EASY COMFORT PEN NEEDLES) 31G X 5 MM MISC Use 1x a day 100 each 3  . lamoTRIgine (LAMICTAL) 200 MG tablet Take 400 mg by mouth daily.    Marland Kitchen levothyroxine (SYNTHROID, LEVOTHROID) 50 MCG tablet Take 1 tablet (50 mcg total) by mouth daily before breakfast. 30 tablet 1  . lisinopril-hydrochlorothiazide (PRINZIDE,ZESTORETIC) 20-12.5 MG per tablet TAKE 1 TABLET ONCE DAILY. 90 tablet 1  . Multiple Vitamin (MULTIVITAMIN) tablet Take 1 tablet by mouth daily.      . Omega-3 350 MG CAPS Take 350 mg by mouth daily.      Marland Kitchen omeprazole (PRILOSEC) 20 MG capsule Take 20 mg by mouth daily.    [name] DELICA LANCETS 82N MISC 1 each by Does not apply route 2 (two) times daily. 100 each 3  . PROAIR HFA 108 (90  BASE) MCG/ACT inhaler USE 2 PUFFS EVERY 6 HOURS AS NEEDED. 8.5 g 4  . [DISCONTINUED] azelastine (ASTELIN) 137 MCG/SPRAY nasal spray 1 spray by Nasal route 2 (two) times daily. Use in each nostril as directed     . [DISCONTINUED] budesonide-formoterol (SYMBICORT) 160-4.5 MCG/ACT inhaler Inhale 2 puffs into the lungs 2 (two) times daily.      . [DISCONTINUED] diphenhydrAMINE (SOMINEX) 25 MG tablet Take 25 mg by mouth at bedtime as needed.      . [DISCONTINUED] fluticasone (FLONASE) 50 MCG/ACT nasal spray 2 sprays by Nasal route daily.      . [DISCONTINUED] temazepam (RESTORIL) 30 MG capsule Take 30 mg by mouth at bedtime as needed.     No current facility-administered medications on file prior to visit.    Past Medical History  Diagnosis Date  . Allergy   . Anxiety   . Asthma   . Depression   . Diabetes mellitus   . Hyperlipidemia   . Hypertension   . IBS (irritable bowel  syndrome)   . Thyroid disease   . Arthritis   . OSA (obstructive sleep apnea)   . Dyspareunia     vaginal dryness  . Blood transfusion without reported diagnosis 2002    with knee replacement  . Fibroid     reason for hysterectomy  . STD (sexually transmitted disease)     Hx HPV  . Osteopenia 2009  . Diabetes mellitus type 2 with retinopathy   . Diverticulitis   . GERD (gastroesophageal reflux disease)     Review of Systems  See HPI    Objective:    BP 142/80 mmHg  Pulse 94  Temp(Src) 98.5 F (36.9 C) (Oral)  Resp 18  Ht 5' 1.5" (1.562 m)  Wt 237 lb 3.2 oz (107.593 kg)  BMI 44.10 kg/m2  SpO2 94%  LMP 12/31/1993 Nursing note and vital signs reviewed.  Physical Exam  Constitutional: She is oriented to person, place, and time. She appears well-developed and well-nourished. No distress.  Cardiovascular: Normal rate, regular rhythm, normal heart sounds and intact distal pulses.   Pulmonary/Chest: Effort normal and breath sounds normal.  Neurological: She is alert and oriented to person, place, and time.  Skin: Skin is warm and dry.  Psychiatric: She has a normal mood and affect. Her behavior is normal. Judgment and thought content normal.       Assessment & Plan:

## 2014-12-22 NOTE — Patient Instructions (Signed)
Thank you for choosing [facility].  Summary/Instructions:  Please schedule a time for a physical at your convenience

## 2014-12-22 NOTE — Assessment & Plan Note (Signed)
Blood pressure stable at goal with current regimen. Continue current lisinopril-HCTZ as prescribed.

## 2014-12-22 NOTE — Assessment & Plan Note (Signed)
Remains labile. Currently maintained on Pristiq and lamictal. Continue current medications as prescribed by psychiatry. Will be undergoing ECT treatments with Dr. Weber Cooks starting on 12/18. Follow up as needed per psychiatry.

## 2014-12-22 NOTE — Progress Notes (Signed)
Pre visit review using our clinic review tool, if applicable. No additional management support is needed unless otherwise documented below in the visit note. 

## 2015-01-17 ENCOUNTER — Other Ambulatory Visit: Payer: Self-pay

## 2015-01-17 MED ORDER — LEVOTHYROXINE SODIUM 50 MCG PO TABS
50.0000 ug | ORAL_TABLET | Freq: Every day | ORAL | Status: DC
Start: 2015-01-17 — End: 2015-02-18

## 2015-01-17 NOTE — Telephone Encounter (Signed)
Rx request for levothyroxine 50 mcg- Take 1 tablet once daily  #30  Rx sent to pharmacy and noted pt needs ov to establish care.

## 2015-01-20 ENCOUNTER — Ambulatory Visit: Payer: Medicare Other | Admitting: Internal Medicine

## 2015-01-25 ENCOUNTER — Observation Stay (HOSPITAL_COMMUNITY)
Admission: EM | Admit: 2015-01-25 | Discharge: 2015-01-27 | Disposition: A | Discharge status: Home / Self Care | Payer: Medicare Other | Attending: Internal Medicine | Admitting: Internal Medicine

## 2015-01-25 ENCOUNTER — Encounter (HOSPITAL_COMMUNITY): Payer: Self-pay

## 2015-01-25 DIAGNOSIS — G4733 Obstructive sleep apnea (adult) (pediatric): Secondary | ICD-10-CM | POA: Diagnosis not present

## 2015-01-25 DIAGNOSIS — J45909 Unspecified asthma, uncomplicated: Secondary | ICD-10-CM | POA: Diagnosis not present

## 2015-01-25 DIAGNOSIS — R9431 Abnormal electrocardiogram [ECG] [EKG]: Secondary | ICD-10-CM

## 2015-01-25 DIAGNOSIS — Z882 Allergy status to sulfonamides status: Secondary | ICD-10-CM | POA: Diagnosis not present

## 2015-01-25 DIAGNOSIS — Z9049 Acquired absence of other specified parts of digestive tract: Secondary | ICD-10-CM | POA: Diagnosis not present

## 2015-01-25 DIAGNOSIS — M8580 Other specified disorders of bone density and structure, unspecified site: Secondary | ICD-10-CM | POA: Insufficient documentation

## 2015-01-25 DIAGNOSIS — E039 Hypothyroidism, unspecified: Secondary | ICD-10-CM | POA: Insufficient documentation

## 2015-01-25 DIAGNOSIS — Z885 Allergy status to narcotic agent status: Secondary | ICD-10-CM | POA: Insufficient documentation

## 2015-01-25 DIAGNOSIS — Z881 Allergy status to other antibiotic agents status: Secondary | ICD-10-CM | POA: Diagnosis not present

## 2015-01-25 DIAGNOSIS — F419 Anxiety disorder, unspecified: Secondary | ICD-10-CM | POA: Insufficient documentation

## 2015-01-25 DIAGNOSIS — E11319 Type 2 diabetes mellitus with unspecified diabetic retinopathy without macular edema: Secondary | ICD-10-CM | POA: Insufficient documentation

## 2015-01-25 DIAGNOSIS — I471 Supraventricular tachycardia: Principal | ICD-10-CM | POA: Insufficient documentation

## 2015-01-25 DIAGNOSIS — I209 Angina pectoris, unspecified: Secondary | ICD-10-CM

## 2015-01-25 DIAGNOSIS — F329 Major depressive disorder, single episode, unspecified: Secondary | ICD-10-CM | POA: Diagnosis not present

## 2015-01-25 DIAGNOSIS — E785 Hyperlipidemia, unspecified: Secondary | ICD-10-CM | POA: Insufficient documentation

## 2015-01-25 DIAGNOSIS — R079 Chest pain, unspecified: Secondary | ICD-10-CM | POA: Diagnosis not present

## 2015-01-25 DIAGNOSIS — K219 Gastro-esophageal reflux disease without esophagitis: Secondary | ICD-10-CM | POA: Insufficient documentation

## 2015-01-25 DIAGNOSIS — N941 Dyspareunia: Secondary | ICD-10-CM | POA: Insufficient documentation

## 2015-01-25 DIAGNOSIS — R0789 Other chest pain: Secondary | ICD-10-CM | POA: Diagnosis not present

## 2015-01-25 DIAGNOSIS — I10 Essential (primary) hypertension: Secondary | ICD-10-CM | POA: Diagnosis not present

## 2015-01-25 DIAGNOSIS — I4581 Long QT syndrome: Secondary | ICD-10-CM | POA: Insufficient documentation

## 2015-01-25 LAB — I-STAT CHEM 8, ED
BUN: 23 mg/dL (ref 6–23)
CALCIUM ION: 1.21 mmol/L (ref 1.13–1.30)
CHLORIDE: 98 mmol/L (ref 96–112)
Creatinine, Ser: 1.1 mg/dL (ref 0.50–1.10)
Glucose, Bld: 260 mg/dL — ABNORMAL HIGH (ref 70–99)
HEMATOCRIT: 46 % (ref 36.0–46.0)
Hemoglobin: 15.6 g/dL — ABNORMAL HIGH (ref 12.0–15.0)
Potassium: 3.9 mmol/L (ref 3.5–5.1)
SODIUM: 137 mmol/L (ref 135–145)
TCO2: 26 mmol/L (ref 0–100)

## 2015-01-25 LAB — CBC WITH DIFFERENTIAL/PLATELET
BASOS ABS: 0.1 10*3/uL (ref 0.0–0.1)
BASOS PCT: 1 % (ref 0–1)
EOS ABS: 0.4 10*3/uL (ref 0.0–0.7)
Eosinophils Relative: 4 % (ref 0–5)
HCT: 42 % (ref 36.0–46.0)
HEMOGLOBIN: 14.4 g/dL (ref 12.0–15.0)
LYMPHS ABS: 4.2 10*3/uL — AB (ref 0.7–4.0)
LYMPHS PCT: 37 % (ref 12–46)
MCH: 31.2 pg (ref 26.0–34.0)
MCHC: 34.3 g/dL (ref 30.0–36.0)
MCV: 91.1 fL (ref 78.0–100.0)
Monocytes Absolute: 1 10*3/uL (ref 0.1–1.0)
Monocytes Relative: 9 % (ref 3–12)
Neutro Abs: 5.5 10*3/uL (ref 1.7–7.7)
Neutrophils Relative %: 49 % (ref 43–77)
Platelets: 253 10*3/uL (ref 150–400)
RBC: 4.61 MIL/uL (ref 3.87–5.11)
RDW: 12.6 % (ref 11.5–15.5)
WBC: 11.2 10*3/uL — ABNORMAL HIGH (ref 4.0–10.5)

## 2015-01-25 LAB — I-STAT TROPONIN, ED: Troponin i, poc: 0 ng/mL (ref 0.00–0.08)

## 2015-01-25 MED ORDER — ASPIRIN 81 MG PO CHEW
324.0000 mg | CHEWABLE_TABLET | Freq: Once | ORAL | Status: DC
  Filled 2015-01-25: qty 4

## 2015-01-25 MED ORDER — NITROGLYCERIN 0.4 MG SL SUBL
0.4000 mg | SUBLINGUAL_TABLET | SUBLINGUAL | Status: DC | PRN
  Filled 2015-01-25: qty 1

## 2015-01-25 NOTE — ED Notes (Signed)
Per EMS; pt from home with SVT.  Sts she has had a couple of episodes over the past 4-5 days.  Today tachycardia did not resolve with vasal vagal maneuver.  Pt given 6 mg of Adenosine by EMS; hr went from 180 to 90; chest pain decreased from 7 to 4.  Pt with chest pain with radiation to shoulders; pain described as heaviness to chest.  Depression in leads v2-v4.

## 2015-01-25 NOTE — ED Notes (Signed)
MD at bedside. 

## 2015-01-25 NOTE — ED Provider Notes (Signed)
CSN: [id number]     Arrival date & time 01/25/15  2223 History  This chart was scribed for Varney Biles, MD by Rayfield Citizen, ED Scribe. This patient was seen in room D30C/D30C and the patient's care was started at 11:34 PM.    Chief Complaint  Patient presents with  . Tachycardia   The history is provided by the patient. No language interpreter was used.     HPI Comments: Meredith Hayden is a 65 y.o. female with past medical history of DM, HLD, HTN who presents to the Emergency Department complaining of tachycardia. Patient reports that she was brought in by EMS and diagnosed with SVT en route. She reports she has had several episodes of this issue, lasting 2 hours or so at a time, over the past 4-5 days; she cannot relate these episodes to any particular activity. Tonight her symptoms were worse than previous episodes; she also noted chest pain (described as "heaviness" or "pressure") with radiation to shoulders with nausea. She has diaphoresis. She currently complains of a chest pressure.   She denies history of PE or DVT, recent neck pain or swelling, recent infections or fever. She reports that her father died at 62 of a heart attack; sister also had a heart attack at 31. She denies taking any supplements; she denies stimulants, diet medications, energy drinks. She notes that she started gabapentin yesterday but her symptoms began prior to this regimen.   PCP at [facility].   Past Medical History  Diagnosis Date  . Allergy   . Anxiety   . Asthma   . Depression   . Diabetes mellitus   . Hyperlipidemia   . Hypertension   . IBS (irritable bowel syndrome)   . Thyroid disease   . Arthritis   . OSA (obstructive sleep apnea)   . Dyspareunia     vaginal dryness  . Blood transfusion without reported diagnosis 2002    with knee replacement  . Fibroid     reason for hysterectomy  . STD (sexually transmitted disease)     Hx HPV  . Osteopenia 2009  . Diabetes mellitus type 2 with  retinopathy   . Diverticulitis   . GERD (gastroesophageal reflux disease)    Past Surgical History  Procedure Laterality Date  . Partial colectomy      -RSO  . Total knee arthroplasty Bilateral 2002, 2004  . Oophorectomy    . Abdominal hysterectomy  1995    TAH/LSO--Dr. Ubaldo Glassing  . Rotator cuff repair Left   . Cesarean section  1978  . Foot surgery Bilateral   . Nasal sinus surgery     Family History  Problem Relation Age of Onset  . COPD Mother   . Heart attack Father   . Hypertension Father   . Heart attack Sister   . Diabetes Sister   . Hypertension Sister   . Hyperlipidemia      fhx  . Hypertension      fhx  . Cancer      ovarian/grandmother  . Ovarian cancer Maternal Aunt   . Hypertension Sister    History  Substance Use Topics  . Smoking status: Never Smoker   . Smokeless tobacco: Never Used  . Alcohol Use: Yes     Comment: rarely   OB History    Gravida Para Term Preterm AB TAB SAB Ectopic Multiple Living   '2 2 2       2     ' Review of Systems  Constitutional: Positive for diaphoresis. Negative for activity change.  HENT: Negative for facial swelling.   Respiratory: Positive for shortness of breath. Negative for cough and wheezing.   Cardiovascular: Positive for chest pain.       Tachycardia  Gastrointestinal: Positive for nausea. Negative for vomiting, abdominal pain, diarrhea, constipation, blood in stool and abdominal distention.  Genitourinary: Negative for hematuria and difficulty urinating.  Musculoskeletal: Negative for neck pain.  Skin: Negative for color change.  Neurological: Positive for dizziness. Negative for speech difficulty.  Hematological: Does not bruise/bleed easily.  Psychiatric/Behavioral: Negative for confusion.   Allergies  Codeine phosphate; Erythromycin; Morphine sulfate; Oxycodone hcl; Oxycodone-acetaminophen; and Sulfamethoxazole  Home Medications   Prior to Admission medications   Medication Sig Start Date End Date  Taking? Authorizing Provider  ALPRAZolam Duanne Moron) 1 MG tablet Take 0.5-2 mg by mouth every 4 (four) hours as needed for anxiety or sleep. Take 1-2 tabs up to 6 times daily   Yes Historical Provider, MD  Ascorbic Acid (VITAMIN C) 1000 MG tablet Take 1,000 mg by mouth daily.   Yes Historical Provider, MD  aspirin 81 MG tablet Take 81 mg by mouth daily.    Yes Historical Provider, MD  atorvastatin (LIPITOR) 10 MG tablet TAKE 1 TABLET DAILY OR AS DIRECTED. 11/29/14  Yes Bruce Kendall Flack, MD  Blood Glucose Monitoring Suppl (ONETOUCH VERIO) W/DEVICE KIT 1 each by Does not apply route daily. 12/23/13  Yes Lisabeth Pick, MD  Calcium Carbonate-Vit D-Min (CALCIUM 1200 PO) Take 1 tablet by mouth daily.   Yes Historical Provider, MD  cetirizine (ZYRTEC) 10 MG tablet Take 10 mg by mouth daily.    Yes Historical Provider, MD  Cholecalciferol (VITAMIN D3) 1000 UNITS CAPS Take 1 capsule by mouth daily.     Yes Historical Provider, MD  co-enzyme Q-10 30 MG capsule Take 30 mg by mouth daily.     Yes Historical Provider, MD  cyanocobalamin 100 MCG tablet Take 100 mcg by mouth daily.     Yes Historical Provider, MD  desvenlafaxine (PRISTIQ) 100 MG 24 hr tablet Take 100 mg by mouth daily.     Yes Historical Provider, MD  gabapentin (NEURONTIN) 600 MG tablet Take 600 mg by mouth daily with supper.   Yes Historical Provider, MD  Garlic 417 MG TABS Take 1 tablet by mouth daily.     Yes Historical Provider, MD  GLIPIZIDE XL 10 MG 24 hr tablet TAKE 1 TABLET DAILY WITH BREAKFAST. 12/22/14  Yes Philemon Kingdom, MD  glucose blood (ONETOUCH VERIO) test strip 1 each by Other route 2 (two) times daily. Use as instructed 12/23/13  Yes Bruce Kendall Flack, MD  Insulin Glargine (LANTUS SOLOSTAR) 100 UNIT/ML Solostar Pen Inject 30 Units into the skin at bedtime. 12/09/14  Yes Philemon Kingdom, MD  Insulin Pen Needle (EASY COMFORT PEN NEEDLES) 31G X 5 MM MISC Use 1x a day 07/23/14  Yes Philemon Kingdom, MD  lamoTRIgine (LAMICTAL) 150 MG  tablet Take 75 mg by mouth daily.   Yes Historical Provider, MD  levothyroxine (SYNTHROID, LEVOTHROID) 50 MCG tablet Take 1 tablet (50 mcg total) by mouth daily before breakfast. 01/17/15  Yes Bruce Kendall Flack, MD  lisinopril-hydrochlorothiazide (PRINZIDE,ZESTORETIC) 20-12.5 MG per tablet TAKE 1 TABLET ONCE DAILY. 11/29/14  Yes Lisabeth Pick, MD  Multiple Vitamin (MULTIVITAMIN) tablet Take 1 tablet by mouth daily.     Yes Historical Provider, MD  Omega-3 350 MG CAPS Take 350 mg by mouth daily.     Yes  Historical Provider, MD  omeprazole (PRILOSEC) 20 MG capsule Take 20 mg by mouth daily.   Yes Historical Provider, MD  [facility] DELICA LANCETS 43P MISC 1 each by Does not apply route 2 (two) times daily. 01/05/14  Yes Lisabeth Pick, MD  PROAIR HFA 108 (90 BASE) MCG/ACT inhaler USE 2 PUFFS EVERY 6 HOURS AS NEEDED. Patient taking differently: USE 2 PUFFS EVERY 6 HOURS AS NEEDED FOR SHORTNESS OF BREATH 12/21/14  Yes Lisabeth Pick, MD  AFLURIA PRESERVATIVE FREE 0.5 ML SUSY  10/29/14   Historical Provider, MD  lamoTRIgine (LAMICTAL) 200 MG tablet Take 400 mg by mouth daily.    Historical Provider, MD   BP 119/64 mmHg  Pulse 86  Resp 16  SpO2 93%  LMP 12/31/1993 Physical Exam  Constitutional: She is oriented to person, place, and time. She appears well-developed and well-nourished. No distress.  HENT:  Head: Normocephalic and atraumatic.  Mouth/Throat: Oropharynx is clear and moist. No oropharyngeal exudate.  Eyes: EOM are normal. Pupils are equal, round, and reactive to light.  Neck: Normal range of motion. Neck supple.  Cardiovascular: Normal rate, regular rhythm and normal heart sounds.  Exam reveals no gallop and no friction rub.   No murmur heard. 2+ radial pulses equal bilaterally   Pulmonary/Chest: Effort normal and breath sounds normal. No respiratory distress. She has no wheezes. She has no rales.  Abdominal: Soft. There is no tenderness. There is no rebound and no guarding.   Musculoskeletal: Normal range of motion. She exhibits no edema.  No unilateral swelling or pitting edema  Neurological: She is alert and oriented to person, place, and time.  Skin: Skin is warm and dry. No rash noted.  Psychiatric: She has a normal mood and affect. Her behavior is normal.  Nursing note and vitals reviewed.   ED Course  Procedures   DIAGNOSTIC STUDIES: Unavailable at 23:43    COORDINATION OF CARE: 11:42 PM Discussed treatment plan with pt at bedside and pt agreed to plan.   Labs Review Labs Reviewed  CBC WITH DIFFERENTIAL/PLATELET - Abnormal; Notable for the following:    WBC 11.2 (*)    Lymphs Abs 4.2 (*)    All other components within normal limits  I-STAT CHEM 8, ED - Abnormal; Notable for the following:    Glucose, Bld 260 (*)    Hemoglobin 15.6 (*)    All other components within normal limits  TSH  APTT  PROTIME-INR  D-DIMER, QUANTITATIVE  CK TOTAL AND CKMB  CK TOTAL AND CKMB  CK TOTAL AND CKMB  TROPONIN I  TROPONIN I  TROPONIN I  I-STAT TROPOININ, ED  Randolm Idol, ED    Imaging Review Dg Chest Port 1 View  01/26/2015   CLINICAL DATA:  Diffuse chest pain, shortness of breath. History of asthma.  EXAM: PORTABLE CHEST - 1 VIEW  COMPARISON:  Chest radiograph December 20, 2014  FINDINGS: Cardiomediastinal silhouette is unremarkable. The lungs are clear without pleural effusions or focal consolidations. Trachea projects midline and there is no pneumothorax. Soft tissue planes and included osseous structures are non-suspicious.  IMPRESSION: No acute cardiopulmonary process ; stable chest radiograph from December 20, 2014.   Electronically Signed   By: Elon Alas   On: 01/26/2015 01:21     EKG Interpretation   Date/Time:  Tuesday January 25 2015 22:35:15 EST Ventricular Rate:  99 PR Interval:  166 QRS Duration: 125 QT Interval:  415 QTC Calculation: 533 R Axis:   -98 Text Interpretation:  Sinus rhythm Right bundle branch block  Probable  inferior infarct, recent Baseline wander in lead(s) I aVL V1 V4 V5 No  significant change since Confirmed by Zayvien Canning, MD, Saia Derossett [phone]) on  01/25/2015 11:46:30 PM      MDM   Final diagnoses:  Chest pain  PSVT (paroxysmal supraventricular tachycardia)  Ischemic chest pain  Acute chest pain    I personally performed the services described in this documentation, which was scribed in my presence. The recorded information has been reviewed and is accurate.  Differential diagnosis includes: ACS syndrome CHF exacerbation Valvular disorder Myocarditis Pericarditis Pericardial effusion Pneumonia Pleural effusion Pulmonary edema PE Anemia Musculoskeletal pain Thyroid disorder Medication side effect  Pt comes in w/ palpitations and associated chest pain (heaviness), nausea, diophoresis, dib. Acute episode, noted to be in PSVT by paramedics, pt was chemically converted with adenosine. Not sure what the cause is. Pt has no hx of PE, DVT and denies any exogenous estrogen use, long distance travels or surgery in the past 6 weeks, active cancer, recent immobilization. TSH will be ordered as well. Basic cardiac workup and e'lytes ordered.  The chest pain is described as heaviness, and even after she was converted to sinus rhythm, she still has heaviness.  History  Moderately suspicious 1  ECG  Normal 0   Age 73 - 67 years 1   Risk Factors ? 3 risk factors or history of atherosclerotic disease 2  Troponin: pending  HEART score is 4.  Will admit for chest pain workup.     Varney Biles, MD 01/26/15 [phone]

## 2015-01-26 ENCOUNTER — Observation Stay (HOSPITAL_COMMUNITY): Payer: Medicare Other

## 2015-01-26 ENCOUNTER — Ambulatory Visit: Payer: Self-pay | Admitting: Family

## 2015-01-26 ENCOUNTER — Emergency Department (HOSPITAL_COMMUNITY): Payer: Medicare Other

## 2015-01-26 ENCOUNTER — Encounter (HOSPITAL_COMMUNITY): Payer: Self-pay | Admitting: Internal Medicine

## 2015-01-26 DIAGNOSIS — I471 Supraventricular tachycardia: Secondary | ICD-10-CM

## 2015-01-26 DIAGNOSIS — J45909 Unspecified asthma, uncomplicated: Secondary | ICD-10-CM | POA: Diagnosis not present

## 2015-01-26 DIAGNOSIS — R079 Chest pain, unspecified: Secondary | ICD-10-CM

## 2015-01-26 DIAGNOSIS — E785 Hyperlipidemia, unspecified: Secondary | ICD-10-CM | POA: Diagnosis not present

## 2015-01-26 DIAGNOSIS — E11319 Type 2 diabetes mellitus with unspecified diabetic retinopathy without macular edema: Secondary | ICD-10-CM

## 2015-01-26 DIAGNOSIS — R072 Precordial pain: Secondary | ICD-10-CM

## 2015-01-26 DIAGNOSIS — I10 Essential (primary) hypertension: Secondary | ICD-10-CM | POA: Diagnosis not present

## 2015-01-26 DIAGNOSIS — J986 Disorders of diaphragm: Secondary | ICD-10-CM | POA: Diagnosis not present

## 2015-01-26 DIAGNOSIS — I209 Angina pectoris, unspecified: Secondary | ICD-10-CM | POA: Diagnosis not present

## 2015-01-26 LAB — D-DIMER, QUANTITATIVE: D-Dimer, Quant: 0.53 ug/mL-FEU — ABNORMAL HIGH (ref 0.00–0.48)

## 2015-01-26 LAB — CBC
HEMATOCRIT: 41 % (ref 36.0–46.0)
Hemoglobin: 13.6 g/dL (ref 12.0–15.0)
MCH: 30 pg (ref 26.0–34.0)
MCHC: 33.2 g/dL (ref 30.0–36.0)
MCV: 90.5 fL (ref 78.0–100.0)
PLATELETS: 251 10*3/uL (ref 150–400)
RBC: 4.53 MIL/uL (ref 3.87–5.11)
RDW: 13 % (ref 11.5–15.5)
WBC: 9.7 10*3/uL (ref 4.0–10.5)

## 2015-01-26 LAB — CK TOTAL AND CKMB (NOT AT ARMC)
CK TOTAL: 156 U/L (ref 7–177)
CK TOTAL: 173 U/L (ref 7–177)
CK, MB: 2.9 ng/mL (ref 0.3–4.0)
CK, MB: 2.2 ng/mL (ref 0.3–4.0)
CK, MB: 2 ng/mL (ref 0.3–4.0)
RELATIVE INDEX: 1.6 (ref 0.0–2.5)
Relative Index: 1.4 (ref 0.0–2.5)
Relative Index: 1.2 (ref 0.0–2.5)
Total CK: 185 U/L — ABNORMAL HIGH (ref 7–177)

## 2015-01-26 LAB — COMPREHENSIVE METABOLIC PANEL
ALBUMIN: 4 g/dL (ref 3.5–5.2)
ALT: 28 U/L (ref 0–35)
ANION GAP: 6 (ref 5–15)
AST: 41 U/L — ABNORMAL HIGH (ref 0–37)
Alkaline Phosphatase: 74 U/L (ref 39–117)
BUN: 19 mg/dL (ref 6–23)
CALCIUM: 9.2 mg/dL (ref 8.4–10.5)
CO2: 30 mmol/L (ref 19–32)
CREATININE: 1.2 mg/dL — AB (ref 0.50–1.10)
Chloride: 99 mmol/L (ref 96–112)
GFR calc Af Amer: 55 mL/min — ABNORMAL LOW (ref >90)
GFR calc non Af Amer: 47 mL/min — ABNORMAL LOW (ref >90)
Glucose, Bld: 233 mg/dL — ABNORMAL HIGH (ref 70–99)
Potassium: 4.1 mmol/L (ref 3.5–5.1)
Sodium: 135 mmol/L (ref 135–145)
TOTAL PROTEIN: 6.8 g/dL (ref 6.0–8.3)
Total Bilirubin: 0.6 mg/dL (ref 0.3–1.2)

## 2015-01-26 LAB — LIPID PANEL
CHOL/HDL RATIO: 4.5 ratio
Cholesterol: 157 mg/dL (ref 0–200)
HDL: 35 mg/dL — AB (ref >39)
LDL Cholesterol: 86 mg/dL (ref 0–99)
TRIGLYCERIDES: 178 mg/dL — AB (ref <150)
VLDL: 36 mg/dL (ref 0–40)

## 2015-01-26 LAB — GLUCOSE, CAPILLARY
GLUCOSE-CAPILLARY: 198 mg/dL — AB (ref 70–99)
GLUCOSE-CAPILLARY: 163 mg/dL — AB (ref 70–99)
Glucose-Capillary: 218 mg/dL — ABNORMAL HIGH (ref 70–99)
Glucose-Capillary: 271 mg/dL — ABNORMAL HIGH (ref 70–99)

## 2015-01-26 LAB — TROPONIN I
Troponin I: <0.03 ng/mL (ref <0.031)
Troponin I: <0.03 ng/mL (ref <0.031)

## 2015-01-26 LAB — PROTIME-INR
INR: 1.05 (ref 0.00–1.49)
PROTHROMBIN TIME: 13.8 s (ref 11.6–15.2)

## 2015-01-26 LAB — HEMOGLOBIN A1C
HEMOGLOBIN A1C: 9.8 % — AB (ref <5.7)
MEAN PLASMA GLUCOSE: 235 mg/dL — AB (ref <117)

## 2015-01-26 LAB — I-STAT TROPONIN, ED: Troponin i, poc: 0 ng/mL (ref 0.00–0.08)

## 2015-01-26 LAB — TSH: TSH: 1.655 u[IU]/mL (ref 0.350–4.500)

## 2015-01-26 LAB — APTT: APTT: 30 s (ref 24–37)

## 2015-01-26 MED ORDER — INSULIN ASPART 100 UNIT/ML ~~LOC~~ SOLN
0.0000 [IU] | Freq: Three times a day (TID) | SUBCUTANEOUS | Status: DC
  Administered 2015-01-26: 3 [IU] via SUBCUTANEOUS
  Administered 2015-01-27: 2 [IU] via SUBCUTANEOUS
  Administered 2015-01-27: 3 [IU] via SUBCUTANEOUS

## 2015-01-26 MED ORDER — IOHEXOL 350 MG/ML SOLN
100.0000 mL | Freq: Once | INTRAVENOUS | Status: AC | PRN
  Administered 2015-01-26: 100 mL via INTRAVENOUS

## 2015-01-26 MED ORDER — INSULIN ASPART 100 UNIT/ML ~~LOC~~ SOLN
0.0000 [IU] | Freq: Every day | SUBCUTANEOUS | Status: DC
  Administered 2015-01-26: 3 [IU] via SUBCUTANEOUS

## 2015-01-26 MED ORDER — REGADENOSON 0.4 MG/5ML IV SOLN
INTRAVENOUS | Status: AC
  Administered 2015-01-26: 0.4 mg via INTRAVENOUS
  Filled 2015-01-26: qty 5

## 2015-01-26 MED ORDER — INSULIN GLARGINE 100 UNIT/ML ~~LOC~~ SOLN
15.0000 [IU] | Freq: Every day | SUBCUTANEOUS | Status: DC
Start: 2015-01-26 — End: 2015-01-27
  Administered 2015-01-26: 15 [IU] via SUBCUTANEOUS
  Filled 2015-01-26 (×2): qty 0.15

## 2015-01-26 MED ORDER — ENOXAPARIN SODIUM 120 MG/0.8ML ~~LOC~~ SOLN
1.0000 mg/kg | Freq: Once | SUBCUTANEOUS | Status: AC
Start: 2015-01-26 — End: 2015-01-26
  Administered 2015-01-26: 105 mg via SUBCUTANEOUS
  Filled 2015-01-26: qty 0.8

## 2015-01-26 MED ORDER — ACETAMINOPHEN 650 MG RE SUPP
650.0000 mg | Freq: Four times a day (QID) | RECTAL | Status: DC | PRN
Start: 2015-01-26 — End: 2015-01-27

## 2015-01-26 MED ORDER — ACETAMINOPHEN 325 MG PO TABS
650.0000 mg | ORAL_TABLET | Freq: Four times a day (QID) | ORAL | Status: DC | PRN
  Administered 2015-01-26 – 2015-01-27 (×3): 650 mg via ORAL
  Filled 2015-01-26 (×3): qty 2

## 2015-01-26 MED ORDER — LAMOTRIGINE 25 MG PO TABS
75.0000 mg | ORAL_TABLET | Freq: Every day | ORAL | Status: DC
  Administered 2015-01-26 (×2): 75 mg via ORAL
  Filled 2015-01-26 (×2): qty 3

## 2015-01-26 MED ORDER — LEVOTHYROXINE SODIUM 50 MCG PO TABS
50.0000 ug | ORAL_TABLET | Freq: Every day | ORAL | Status: DC
  Administered 2015-01-26 – 2015-01-27 (×2): 50 ug via ORAL
  Filled 2015-01-26 (×3): qty 1

## 2015-01-26 MED ORDER — ALPRAZOLAM 0.5 MG PO TABS
0.5000 mg | ORAL_TABLET | ORAL | Status: DC | PRN
  Administered 2015-01-27: 0.5 mg via ORAL

## 2015-01-26 MED ORDER — LORATADINE 10 MG PO TABS
10.0000 mg | ORAL_TABLET | Freq: Every day | ORAL | Status: DC
  Administered 2015-01-27: 10 mg via ORAL
  Filled 2015-01-26 (×3): qty 1

## 2015-01-26 MED ORDER — ASPIRIN 81 MG PO CHEW
81.0000 mg | CHEWABLE_TABLET | Freq: Every day | ORAL | Status: DC
  Administered 2015-01-26 – 2015-01-27 (×2): 81 mg via ORAL
  Filled 2015-01-26 (×3): qty 1

## 2015-01-26 MED ORDER — GABAPENTIN 600 MG PO TABS
600.0000 mg | ORAL_TABLET | Freq: Every day | ORAL | Status: DC
  Administered 2015-01-26: 600 mg via ORAL
  Filled 2015-01-26 (×2): qty 1

## 2015-01-26 MED ORDER — TECHNETIUM TC 99M SESTAMIBI GENERIC - CARDIOLITE
10.0000 | Freq: Once | INTRAVENOUS | Status: AC | PRN
  Administered 2015-01-26: 10 via INTRAVENOUS

## 2015-01-26 MED ORDER — ONDANSETRON HCL 4 MG/2ML IJ SOLN
4.0000 mg | Freq: Four times a day (QID) | INTRAMUSCULAR | Status: DC | PRN
  Administered 2015-01-26 – 2015-01-27 (×3): 4 mg via INTRAVENOUS
  Filled 2015-01-26 (×3): qty 2

## 2015-01-26 MED ORDER — LISINOPRIL-HYDROCHLOROTHIAZIDE 20-12.5 MG PO TABS: 1.0000 | ORAL_TABLET | Freq: Every day | ORAL | Status: DC

## 2015-01-26 MED ORDER — PANTOPRAZOLE SODIUM 40 MG PO TBEC
40.0000 mg | DELAYED_RELEASE_TABLET | Freq: Every day | ORAL | Status: DC
  Administered 2015-01-26 – 2015-01-27 (×2): 40 mg via ORAL
  Filled 2015-01-26 (×2): qty 1

## 2015-01-26 MED ORDER — METOPROLOL TARTRATE 12.5 MG HALF TABLET
12.5000 mg | ORAL_TABLET | Freq: Two times a day (BID) | ORAL | Status: DC
  Administered 2015-01-26 – 2015-01-27 (×2): 12.5 mg via ORAL
  Filled 2015-01-26 (×4): qty 1

## 2015-01-26 MED ORDER — ENOXAPARIN SODIUM 40 MG/0.4ML ~~LOC~~ SOLN
40.0000 mg | SUBCUTANEOUS | Status: DC
  Administered 2015-01-27: 40 mg via SUBCUTANEOUS
  Filled 2015-01-26 (×2): qty 0.4

## 2015-01-26 MED ORDER — REGADENOSON 0.4 MG/5ML IV SOLN
0.4000 mg | Freq: Once | INTRAVENOUS | Status: AC
  Administered 2015-01-26: 0.4 mg via INTRAVENOUS
  Filled 2015-01-26: qty 5

## 2015-01-26 MED ORDER — HYDROMORPHONE HCL 1 MG/ML IJ SOLN
1.0000 mg | Freq: Once | INTRAMUSCULAR | Status: AC
  Administered 2015-01-26: 1 mg via INTRAVENOUS

## 2015-01-26 MED ORDER — SODIUM CHLORIDE 0.9 % IJ SOLN
3.0000 mL | Freq: Two times a day (BID) | INTRAMUSCULAR | Status: DC
  Administered 2015-01-26: 3 mL via INTRAVENOUS

## 2015-01-26 MED ORDER — ATORVASTATIN CALCIUM 10 MG PO TABS
10.0000 mg | ORAL_TABLET | Freq: Every day | ORAL | Status: DC
  Administered 2015-01-26: 10 mg via ORAL
  Filled 2015-01-26 (×2): qty 1

## 2015-01-26 MED ORDER — HYDROMORPHONE HCL 1 MG/ML IJ SOLN
0.5000 mg | Freq: Once | INTRAMUSCULAR | Status: AC
  Administered 2015-01-26: 0.5 mg via INTRAVENOUS
  Filled 2015-01-26: qty 1

## 2015-01-26 MED ORDER — NITROGLYCERIN 0.4 MG SL SUBL
SUBLINGUAL_TABLET | SUBLINGUAL | Status: AC
  Administered 2015-01-26: 0.4 mg
  Filled 2015-01-26: qty 2.5

## 2015-01-26 MED ORDER — DESVENLAFAXINE SUCCINATE ER 100 MG PO TB24
100.0000 mg | ORAL_TABLET | Freq: Every day | ORAL | Status: DC
  Administered 2015-01-26: 100 mg via ORAL
  Filled 2015-01-26 (×3): qty 1

## 2015-01-26 MED ORDER — SODIUM CHLORIDE 0.9 % IV SOLN
INTRAVENOUS | Status: DC
  Administered 2015-01-26: 06:00:00 via INTRAVENOUS

## 2015-01-26 MED ORDER — ALBUTEROL SULFATE (2.5 MG/3ML) 0.083% IN NEBU: 3.0000 mL | INHALATION_SOLUTION | Freq: Four times a day (QID) | RESPIRATORY_TRACT | Status: DC | PRN

## 2015-01-26 MED ORDER — HYDROCHLOROTHIAZIDE 12.5 MG PO CAPS
12.5000 mg | ORAL_CAPSULE | Freq: Every day | ORAL | Status: DC
  Administered 2015-01-26 – 2015-01-27 (×2): 12.5 mg via ORAL
  Filled 2015-01-26 (×2): qty 1

## 2015-01-26 MED ORDER — LISINOPRIL 20 MG PO TABS
20.0000 mg | ORAL_TABLET | Freq: Every day | ORAL | Status: DC
  Administered 2015-01-26 – 2015-01-27 (×2): 20 mg via ORAL
  Filled 2015-01-26 (×2): qty 1

## 2015-01-26 MED ORDER — TECHNETIUM TC 99M SESTAMIBI GENERIC - CARDIOLITE
30.0000 | Freq: Once | INTRAVENOUS | Status: AC | PRN
  Administered 2015-01-26: 30 via INTRAVENOUS

## 2015-01-26 MED ORDER — NITROGLYCERIN 0.4 MG SL SUBL
SUBLINGUAL_TABLET | SUBLINGUAL | Status: AC
  Administered 2015-01-26: 0.4 mg
  Filled 2015-01-26: qty 1

## 2015-01-26 MED ORDER — HYDROMORPHONE HCL 1 MG/ML IJ SOLN
INTRAMUSCULAR | Status: AC
  Filled 2015-01-26: qty 1

## 2015-01-26 NOTE — Progress Notes (Signed)
UR completed 

## 2015-01-26 NOTE — Consult Note (Addendum)
Admit date: 01/25/2015 Referring Physician  Dr. Maudie Mercury Primary Physician  Dr. Elna Breslow Primary Cardiologist  Dr. Johnsie Cancel Reason for Consultation  Chest pain  HPI: This is a 65 yo female with type II DM, HTN and hyperlipidemia who developed c/o cp starting about 8:30pm last night, and she called EMS. Her chest pain was right and left sided and described as a heaviness like a brick on her chest with radiation to bilateral arms. She was sob but denied any diaphoresis or nausea.  EMS noted patient appeared to be in SVT and was tx with adenosine and then went into NSR.  Strips are not available for review.  She presented to the ER with minimal residual pain.  She states that she has been having intermittent palpitations and chest pain for over a week.  The palpitations and CP occur at the same time and usually last no longer than 15 minutes.  She had several episodes over the past weekend and few days but last nights episode was worse.  Trop has been negative. Cardiology is now asked to consult for workup of CP.  Of note she had a cardiac cath in 2007 for CP which showed normal LVF with EF 60% and 20% prox/mid LAD and otherwise normal.    PMH:   Past Medical History  Diagnosis Date  . Allergy   . Anxiety   . Asthma   . Depression   . Diabetes mellitus   . Hyperlipidemia   . Hypertension   . IBS (irritable bowel syndrome)   . Thyroid disease   . Arthritis   . OSA (obstructive sleep apnea)   . Dyspareunia     vaginal dryness  . Blood transfusion without reported diagnosis 2002    with knee replacement  . Fibroid     reason for hysterectomy  . STD (sexually transmitted disease)     Hx HPV  . Osteopenia 2009  . Diabetes mellitus type 2 with retinopathy   . Diverticulitis   . GERD (gastroesophageal reflux disease)      PSH:   Past Surgical History  Procedure Laterality Date  . Partial colectomy      -RSO  . Total knee arthroplasty Bilateral 2002, 2004  . Oophorectomy    . Abdominal  hysterectomy  1995    TAH/LSO--Dr. Ubaldo Glassing  . Rotator cuff repair Left   . Cesarean section  1978  . Foot surgery Bilateral   . Nasal sinus surgery      Allergies:  Codeine phosphate; Erythromycin; Morphine sulfate; Oxycodone hcl; Oxycodone-acetaminophen; and Sulfamethoxazole Prior to Admit Meds:   Prescriptions prior to admission  Medication Sig Dispense Refill Last Dose  . ALPRAZolam (XANAX) 1 MG tablet Take 0.5-2 mg by mouth every 4 (four) hours as needed for anxiety or sleep. Take 1-2 tabs up to 6 times daily   01/24/2015 at Unknown time  . Ascorbic Acid (VITAMIN C) 1000 MG tablet Take 1,000 mg by mouth daily.   01/25/2015 at Unknown time  . aspirin 81 MG tablet Take 81 mg by mouth daily.    01/25/2015 at Unknown time  . atorvastatin (LIPITOR) 10 MG tablet TAKE 1 TABLET DAILY OR AS DIRECTED. 90 tablet 1 01/25/2015 at Unknown time  . Blood Glucose Monitoring Suppl (ONETOUCH VERIO) W/DEVICE KIT 1 each by Does not apply route daily. 1 kit 0 01/24/2015 at Unknown time  . Calcium Carbonate-Vit D-Min (CALCIUM 1200 PO) Take 1 tablet by mouth daily.   01/25/2015 at Unknown time  .  cetirizine (ZYRTEC) 10 MG tablet Take 10 mg by mouth daily.    01/25/2015 at Unknown time  . Cholecalciferol (VITAMIN D3) 1000 UNITS CAPS Take 1 capsule by mouth daily.     01/25/2015 at Unknown time  . co-enzyme Q-10 30 MG capsule Take 30 mg by mouth daily.     01/25/2015 at Unknown time  . cyanocobalamin 100 MCG tablet Take 100 mcg by mouth daily.     01/25/2015 at Unknown time  . desvenlafaxine (PRISTIQ) 100 MG 24 hr tablet Take 100 mg by mouth daily.     01/25/2015 at Unknown time  . gabapentin (NEURONTIN) 600 MG tablet Take 600 mg by mouth daily with supper.   01/25/2015 at Unknown time  . Garlic 915 MG TABS Take 1 tablet by mouth daily.     01/25/2015 at Unknown time  . GLIPIZIDE XL 10 MG 24 hr tablet TAKE 1 TABLET DAILY WITH BREAKFAST. 30 tablet 2 01/25/2015 at Unknown time  . glucose blood (ONETOUCH VERIO) test strip 1 each by  Other route 2 (two) times daily. Use as instructed 100 each 11 01/24/2015 at Unknown time  . Insulin Glargine (LANTUS SOLOSTAR) 100 UNIT/ML Solostar Pen Inject 30 Units into the skin at bedtime. 15 mL 2 01/25/2015 at Unknown time  . Insulin Pen Needle (EASY COMFORT PEN NEEDLES) 31G X 5 MM MISC Use 1x a day 100 each 3 01/24/2015 at Unknown time  . lamoTRIgine (LAMICTAL) 150 MG tablet Take 75 mg by mouth daily.   01/25/2015 at Unknown time  . levothyroxine (SYNTHROID, LEVOTHROID) 50 MCG tablet Take 1 tablet (50 mcg total) by mouth daily before breakfast. 30 tablet 0 01/25/2015 at Unknown time  . lisinopril-hydrochlorothiazide (PRINZIDE,ZESTORETIC) 20-12.5 MG per tablet TAKE 1 TABLET ONCE DAILY. 90 tablet 1 01/25/2015 at Unknown time  . Multiple Vitamin (MULTIVITAMIN) tablet Take 1 tablet by mouth daily.     01/25/2015 at Unknown time  . Omega-3 350 MG CAPS Take 350 mg by mouth daily.     01/25/2015 at Unknown time  . omeprazole (PRILOSEC) 20 MG capsule Take 20 mg by mouth daily.   01/25/2015 at Unknown time  . ONETOUCH DELICA LANCETS 05W MISC 1 each by Does not apply route 2 (two) times daily. 100 each 3 01/24/2015 at Unknown time  . PROAIR HFA 108 (90 BASE) MCG/ACT inhaler USE 2 PUFFS EVERY 6 HOURS AS NEEDED. (Patient taking differently: USE 2 PUFFS EVERY 6 HOURS AS NEEDED FOR SHORTNESS OF BREATH) 8.5 g 4 01/24/2015 at Unknown time  . AFLURIA PRESERVATIVE FREE 0.5 ML SUSY   0 Taking  . lamoTRIgine (LAMICTAL) 200 MG tablet Take 400 mg by mouth daily.   Taking   Fam HX:    Family History  Problem Relation Age of Onset  . COPD Mother   . Heart attack Father 32  . Hypertension Father   . Heart attack Sister 39  . Diabetes Sister   . Hypertension Sister   . Hyperlipidemia      fhx  . Hypertension      fhx  . Cancer      ovarian/grandmother  . Ovarian cancer Maternal Aunt   . Hypertension Sister    Social HX:    History   Social History  . Marital Status: Married    Spouse Name: N/A    Number of  Children: 2  . Years of Education: 18   Occupational History  . disabled     orthopedic / psych  Social History Main Topics  . Smoking status: Never Smoker   . Smokeless tobacco: Never Used  . Alcohol Use: Yes     Comment: rarely  . Drug Use: No  . Sexual Activity:    Partners: Male    Birth Control/ Protection: Surgical     Comment: TAH   Other Topics Concern  . Not on file   Social History Narrative   Grew up in [place]. Currently resides in resides in a house with her husband. 4 dogs. Fun: Play with your dogs and be with grandchildren, needle work.    Denies any religious beliefs effecting healthcare.         ROS:  All 11 ROS were addressed and are negative except what is stated in the HPI  Physical Exam: Blood pressure 122/59, pulse 83, temperature 98.1 F (36.7 C), temperature source Oral, resp. rate 18, weight 232 lb 5.8 oz (105.4 kg), last menstrual period 12/31/1993, SpO2 97 %.    General: Well developed, well nourished, in no acute distress Head: Eyes PERRLA, No xanthomas.   Normal cephalic and atramatic  Lungs:   Clear bilaterally to auscultation and percussion. Heart:   HRRR S1 S2 Pulses are 2+ & equal.            No carotid bruit. No JVD.  No abdominal bruits. No femoral bruits. Abdomen: Bowel sounds are positive, abdomen soft and non-tender without masses Extremities:   No clubbing, cyanosis or edema.  DP +1 Neuro: Alert and oriented X 3. Psych:  Good affect, responds appropriately    Labs:   Lab Results  Component Value Date   WBC 9.7 01/26/2015   HGB 13.6 01/26/2015   HCT 41.0 01/26/2015   MCV 90.5 01/26/2015   PLT 251 01/26/2015    Recent Labs Lab 01/26/15 0528  NA 135  K 4.1  CL 99  CO2 30  BUN 19  CREATININE 1.20*  CALCIUM 9.2  PROT 6.8  BILITOT 0.6  ALKPHOS 74  ALT 28  AST 41*  GLUCOSE 233*   No results found for: PTT Lab Results  Component Value Date   INR 1.05 01/26/2015   Lab Results  Component Value Date   CKTOTAL 185*  01/26/2015   CKMB 2.9 01/26/2015   TROPONINI <0.03 01/26/2015     Lab Results  Component Value Date   CHOL 157 01/26/2015   CHOL 154 12/09/2014   CHOL 155 11/02/2013   Lab Results  Component Value Date   HDL 35* 01/26/2015   HDL 42 12/09/2014   HDL 41.60 11/02/2013   Lab Results  Component Value Date   LDLCALC 86 01/26/2015   LDLCALC 55 12/09/2014   LDLCALC 66 05/22/2013   Lab Results  Component Value Date   TRIG 178* 01/26/2015   TRIG 284* 12/09/2014   TRIG 407.0* 11/02/2013   Lab Results  Component Value Date   CHOLHDL 4.5 01/26/2015   CHOLHDL 3.7 12/09/2014   CHOLHDL 4 11/02/2013   Lab Results  Component Value Date   LDLDIRECT 77.1 11/02/2013   LDLDIRECT 93.8 08/30/2010   LDLDIRECT 72.0 03/14/2010      Radiology:  Dg Chest Port 1 View  01/26/2015   CLINICAL DATA:  Diffuse chest pain, shortness of breath. History of asthma.  EXAM: PORTABLE CHEST - 1 VIEW  COMPARISON:  Chest radiograph December 20, 2014  FINDINGS: Cardiomediastinal silhouette is unremarkable. The lungs are clear without pleural effusions or focal consolidations. Trachea projects midline and there is no pneumothorax.  Soft tissue planes and included osseous structures are non-suspicious.  IMPRESSION: No acute cardiopulmonary process ; stable chest radiograph from December 20, 2014.   Electronically Signed   By: Elon Alas   On: 01/26/2015 01:21    EKG:  NSR with RBBB and inferior infarct  ASSESSMENT/PLAN:  1.  Chest pain in setting of ? SVT.  Cardiac enzymes are normal x 2.  Her EKG shows a RBBB and EKG from 2009 showed an incomplete RBBB.  It also shows Q waves in the inferior leads which were present in 2009 as well.  She has several CRF including post menopausal state, Type II DM, dyslipidemia and HTN. She had a heart cath in 2007 that showed 20% LAD otherwise normal.  I suspect her CP is secondary to demand ischemia from SVT.   Would recommend 2D echo to assess LVF.  Patient is NPO for  [employer] today - would check one more troponin now stat to make sure it is ok prior to stress test.  Her D-Dimer was also elevated so a Chest CT angio is ordered to rule out PE.   2.  HTN - controlled.  Continue HCTZ/ACE I and BB 3.  Type II DM - per IM 4.  Dyslipidemia - continue statin.  LDL 86 5.  ? SVT resolved with Adenosine - will try to get the EMS strips.  Supposedly they were placed on her chart but cannot be found at this time.  Continue BB.  If we cannot locate rhythm strips she will need to be be sent home with an event monitor.      Sueanne Margarita, MD  01/26/2015  8:22 AM

## 2015-01-26 NOTE — ED Notes (Signed)
MD at bedside; notified Md that pt has to be chest pain free for 2W to accept; New orders placed; pt currently at 2/10; will reassess pain level in 15 min.

## 2015-01-26 NOTE — Progress Notes (Signed)
Patient called RN to room complaining of chest pressure that radiates up into her jaw and bottom teeth. Notes "It feels like I'm at the dentist". Patient also stated that she was "afraid they're going to send me home and I'll code in the bed".  Patient notes that her "heart is tired" from the stress test today EKG obtained at 1810 BP 119/78 HR 68 Pain 6/10  1811 - 1 sl nitro given Notes pain/pressure is back to a 5/6 and head is now hurting a 7/10  1816 - 2nd sl nitro given BP- 133/66 HR 79 MD on call paged   Damaris Schooner with MD on call @ [phone]. Dilaudid 1 mg IV x 1 dose ordered  1835 - Dilaudid given. BP 115/59 HR77. Patient notes that her pain "returned as you were pushing the dilaudid". Pain is better now. Notes she still has "peppermint" in her toes and ears? RN will continue to monitor.

## 2015-01-26 NOTE — H&P (Signed)
Meredith Hayden is an 65 y.o. female.    Bruce Swords (pcp)  Chief Complaint: chest pain HPI: 65 yo female with dm2, htn, hyperlipidemia,  Apparently c/o cp starting about 8:30pm, and she called EMS.  Cp was right and left sided, heaviness with radiation to bilateral arms.  + sob, denies fever, chills, cough, n/v, heartburn, diarrhea, brbpr, black stool. Pt was tx with adenosine for high hr, ? SVT and now normalized.   Pain very minimal at this time.  Pt presented to ED. Trop negative.  EKG:  St at 100.  t inversion in v1--3.   Pt will be admitted for w/up of cp.   Past Medical History  Diagnosis Date  . Allergy   . Anxiety   . Asthma   . Depression   . Diabetes mellitus   . Hyperlipidemia   . Hypertension   . IBS (irritable bowel syndrome)   . Thyroid disease   . Arthritis   . OSA (obstructive sleep apnea)   . Dyspareunia     vaginal dryness  . Blood transfusion without reported diagnosis 2002    with knee replacement  . Fibroid     reason for hysterectomy  . STD (sexually transmitted disease)     Hx HPV  . Osteopenia 2009  . Diabetes mellitus type 2 with retinopathy   . Diverticulitis   . GERD (gastroesophageal reflux disease)     Past Surgical History  Procedure Laterality Date  . Partial colectomy      -RSO  . Total knee arthroplasty Bilateral 2002, 2004  . Oophorectomy    . Abdominal hysterectomy  1995    TAH/LSO--Dr. Ubaldo Glassing  . Rotator cuff repair Left   . Cesarean section  1978  . Foot surgery Bilateral   . Nasal sinus surgery      Family History  Problem Relation Age of Onset  . COPD Mother   . Heart attack Father 53  . Hypertension Father   . Heart attack Sister 57  . Diabetes Sister   . Hypertension Sister   . Hyperlipidemia      fhx  . Hypertension      fhx  . Cancer      ovarian/grandmother  . Ovarian cancer Maternal Aunt   . Hypertension Sister    Social History:  reports that she has never smoked. She has never used smokeless  tobacco. She reports that she drinks alcohol. She reports that she does not use illicit drugs.  Allergies:  Allergies  Allergen Reactions  . Codeine Phosphate     REACTION: unspecified  . Erythromycin Other (See Comments)    Dizzy and sweaty   . Morphine Sulfate     REACTION: itching  . Oxycodone Hcl     REACTION: unspecified  . Oxycodone-Acetaminophen     REACTION: anaphylaxis  . Sulfamethoxazole     REACTION: rash     (Not in a hospital admission)   Medications reviewed  Results for orders placed or performed during the hospital encounter of 01/25/15 (from the past 48 hour(s))  CBC with Differential     Status: Abnormal   Collection Time: 01/25/15 10:57 PM  Result Value Ref Range   WBC 11.2 (H) 4.0 - 10.5 K/uL   RBC 4.61 3.87 - 5.11 MIL/uL   Hemoglobin 14.4 12.0 - 15.0 g/dL   HCT 42.0 36.0 - 46.0 %   MCV 91.1 78.0 - 100.0 fL   MCH 31.2 26.0 - 34.0 pg   MCHC  34.3 30.0 - 36.0 g/dL   RDW 12.6 11.5 - 15.5 %   Platelets 253 150 - 400 K/uL   Neutrophils Relative % 49 43 - 77 %   Neutro Abs 5.5 1.7 - 7.7 K/uL   Lymphocytes Relative 37 12 - 46 %   Lymphs Abs 4.2 (H) 0.7 - 4.0 K/uL   Monocytes Relative 9 3 - 12 %   Monocytes Absolute 1.0 0.1 - 1.0 K/uL   Eosinophils Relative 4 0 - 5 %   Eosinophils Absolute 0.4 0.0 - 0.7 K/uL   Basophils Relative 1 0 - 1 %   Basophils Absolute 0.1 0.0 - 0.1 K/uL  I-Stat Troponin, ED - 0, 3, 6 hours (not at [facility])     Status: None   Collection Time: 01/25/15 11:03 PM  Result Value Ref Range   Troponin i, poc 0.00 0.00 - 0.08 ng/mL   Comment 3            Comment: Due to the release kinetics of cTnI, a negative result within the first hours of the onset of symptoms does not rule out myocardial infarction with certainty. If myocardial infarction is still suspected, repeat the test at appropriate intervals.   I-Stat Chem 8, ED     Status: Abnormal   Collection Time: 01/25/15 11:06 PM  Result Value Ref Range   Sodium 137 135 - 145  mmol/L   Potassium 3.9 3.5 - 5.1 mmol/L   Chloride 98 96 - 112 mmol/L   BUN 23 6 - 23 mg/dL   Creatinine, Ser 1.10 0.50 - 1.10 mg/dL   Glucose, Bld 260 (H) 70 - 99 mg/dL   Calcium, Ion 1.21 1.13 - 1.30 mmol/L   TCO2 26 0 - 100 mmol/L   Hemoglobin 15.6 (H) 12.0 - 15.0 g/dL   HCT 46.0 36.0 - 46.0 %  TSH     Status: None   Collection Time: 01/25/15 11:59 PM  Result Value Ref Range   TSH 1.655 0.350 - 4.500 uIU/mL  I-Stat Troponin, ED - 0, 3, 6 hours (not at [facility])     Status: None   Collection Time: 01/26/15  1:50 AM  Result Value Ref Range   Troponin i, poc 0.00 0.00 - 0.08 ng/mL   Comment 3            Comment: Due to the release kinetics of cTnI, a negative result within the first hours of the onset of symptoms does not rule out myocardial infarction with certainty. If myocardial infarction is still suspected, repeat the test at appropriate intervals.    Dg Chest Port 1 View  01/26/2015   CLINICAL DATA:  Diffuse chest pain, shortness of breath. History of asthma.  EXAM: PORTABLE CHEST - 1 VIEW  COMPARISON:  Chest radiograph December 20, 2014  FINDINGS: Cardiomediastinal silhouette is unremarkable. The lungs are clear without pleural effusions or focal consolidations. Trachea projects midline and there is no pneumothorax. Soft tissue planes and included osseous structures are non-suspicious.  IMPRESSION: No acute cardiopulmonary process ; stable chest radiograph from December 20, 2014.   Electronically Signed   By: Elon Alas   On: 01/26/2015 01:21    Review of Systems  Constitutional: Negative for fever, chills, weight loss, malaise/fatigue and diaphoresis.  HENT: Negative for congestion, ear discharge, ear pain, hearing loss, nosebleeds, sore throat and tinnitus.   Eyes: Negative for blurred vision, double vision, photophobia, pain, discharge and redness.  Respiratory: Positive for shortness of breath. Negative for cough,  hemoptysis, sputum production, wheezing and stridor.    Cardiovascular: Positive for chest pain and palpitations. Negative for orthopnea, claudication, leg swelling and PND.  Gastrointestinal: Negative for heartburn, nausea, vomiting, abdominal pain, diarrhea, constipation, blood in stool and melena.  Genitourinary: Negative for dysuria, urgency, frequency, hematuria and flank pain.  Musculoskeletal: Negative for myalgias, back pain, joint pain, falls and neck pain.  Skin: Negative for itching and rash.  Neurological: Negative for dizziness, tingling, tremors, sensory change, speech change, focal weakness, seizures, loss of consciousness, weakness and headaches.  Endo/Heme/Allergies: Negative for environmental allergies and polydipsia. Does not bruise/bleed easily.  Psychiatric/Behavioral: Negative for depression, suicidal ideas, hallucinations, memory loss and substance abuse. The patient is not nervous/anxious and does not have insomnia.     Blood pressure 119/64, pulse 86, resp. rate 16, last menstrual period 12/31/1993, SpO2 93 %. Physical Exam  Constitutional: She is oriented to person, place, and time. She appears well-developed and well-nourished.  HENT:  Head: Normocephalic and atraumatic.  Eyes: Conjunctivae and EOM are normal. Pupils are equal, round, and reactive to light.  Neck: Normal range of motion. Neck supple. No JVD present. No tracheal deviation present. No thyromegaly present.  Cardiovascular: Normal rate and regular rhythm.  Exam reveals no gallop and no friction rub.   No murmur heard. Respiratory: Effort normal and breath sounds normal. No respiratory distress. She has no wheezes. She has no rales. She exhibits no tenderness.  GI: Soft. Bowel sounds are normal. She exhibits no distension. There is no tenderness. There is no rebound and no guarding.  Musculoskeletal: Normal range of motion. She exhibits no edema or tenderness.  Lymphadenopathy:    She has no cervical adenopathy.  Neurological: She is alert and oriented to  person, place, and time. She has normal reflexes. She displays normal reflexes. No cranial nerve deficit. She exhibits normal muscle tone. Coordination normal.  Skin: Skin is warm and dry. No rash noted. No erythema. No pallor.  Psychiatric: She has a normal mood and affect. Her behavior is normal. Judgment and thought content normal.     Assessment/Plan #1 Cp Telemetry, cycle cardiac markers NPO after midnite, nuclear stress test in am  #2 Tachycardia  ? Svt Check d dimer,  If positive then obtain CTA chest Check tsh, check echo  #3 Dm2 fsbs ac and qhs , iss  #4  Hypothyroidism Check tsh, cont synthroid  #5  Hypertension Cont current medication  #6 OSA, pt states that she doesn't use cpap at home  #7  Bipolar do,  Cont current tx    Jani Gravel 01/26/2015, 2:58 AM

## 2015-01-26 NOTE — Progress Notes (Signed)
  Echocardiogram 2D Echocardiogram has been performed.  Darlina Sicilian M 01/26/2015, 4:26 PM

## 2015-01-26 NOTE — ED Notes (Signed)
MD at bedside. 

## 2015-01-26 NOTE — Progress Notes (Signed)
CTA chest negative for PE. Nuclear stress test results pending. Appreciate cardiology.

## 2015-01-26 NOTE — Progress Notes (Signed)
[facility] PROGRESS NOTE  Meredith Hayden TMH:[id number] DOB: 04-09-1950 DOA: 01/25/2015 PCP: Mauricio Po, FNP  Summary I have seen and examined Meredith Hayden at bedside and reveiwed her chart. Meredith Hayden is a pleasant 65 year old female with dm2, htn, hyperlipidemia, among other medical problems, who came in with chest pain concerning for acute coronary syndrome.  Pt was tx with adenosine for high hr, ? SVT in the ED.She has some EKG changes. Her d-dimer is slightly elevated at 0.53. She mentions that she tripped over her dog last week and fell on her butt. She says the chest pain has resolved. Sugars are not controlled. Will obtain CT chest with contrast to given slightly elevated d-dimer and give a one-time dose of Lovenox pending CT chest results. Patient is to have nuclear stress test but this should wait pending CTA chest results. I've also consulted cardiology for help with further management. Patient's sugars are not optimally controlled. Await normal globin A1c. Meanwhile, will add Lantus 15 units daily at bedtime(normally takes 30 units at home but will half the dose while patient nothing by mouth). Will follow rest of management per Dr. Maudie Mercury. Plan Cp/Tachycardia? Svt/ Hypertension  Telemetry, cycle cardiac markers/check UA  CTA chest, nuclear stress test if [facility] is negative. Give dose of Lovenox. Consult cardiology.  tsh normal, check echo  Aspirin, ? Beta blocker if blood pressure allowing    Dm2  Lantus/fsbs ac and qhs , iss  Hold glipizide while nothing by mouth   Hypothyroidism  tsh normal, cont synthroid   OSA,   pt states that she doesn't use cpap at home  Bipolar do,   Cont current tx Code Status: Full code Family Communication: Patient at bedside Disposition Plan: Eventually home   Consultants:  Cardiology  Procedures:  Nuclear stress test if CTA chest negative  Antibiotics:  None  HPI/Subjective: Says chest pain is  gone  Objective: Filed Vitals:   01/26/15 0453  BP: 122/59  Pulse: 83  Temp: 98.1 F (36.7 C)  Resp: 18   No intake or output data in the 24 hours ending 01/26/15 0738 Filed Weights   01/26/15 0453  Weight: 105.4 kg (232 lb 5.8 oz)    Exam:   General:  Lying comfortably in bed  Cardiovascular: RRR. S1S2 normal. No murmurs.  Respiratory: Lungs clear  Abdomen: Soft and nontender. Normal bowel sounds.  Musculoskeletal: No pedal edema.   Data Reviewed: Basic Metabolic Panel:  Recent Labs Lab 01/25/15 2306 01/26/15 0528  NA 137 135  K 3.9 4.1  CL 98 99  CO2  --  30  GLUCOSE 260* 233*  BUN 23 19  CREATININE 1.10 1.20*  CALCIUM  --  9.2   Liver Function Tests:  Recent Labs Lab 01/26/15 0528  AST 41*  ALT 28  ALKPHOS 74  BILITOT 0.6  PROT 6.8  ALBUMIN 4.0   No results for input(s): LIPASE, AMYLASE in the last 168 hours. No results for input(s): AMMONIA in the last 168 hours. CBC:  Recent Labs Lab 01/25/15 2257 01/25/15 2306 01/26/15 0528  WBC 11.2*  --  9.7  NEUTROABS 5.5  --   --   HGB 14.4 15.6* 13.6  HCT 42.0 46.0 41.0  MCV 91.1  --  90.5  PLT 253  --  251   Cardiac Enzymes:  Recent Labs Lab 01/26/15 0247  CKTOTAL 185*  CKMB 2.9  TROPONINI <0.03   BNP (last 3 results) No results for input(s): PROBNP in the  last 8760 hours. CBG:  Recent Labs Lab 01/26/15 0652  GLUCAP 198*    No results found for this or any previous visit (from the past 240 hour(s)).   Studies: Dg Chest Port 1 View  01/26/2015   CLINICAL DATA:  Diffuse chest pain, shortness of breath. History of asthma.  EXAM: PORTABLE CHEST - 1 VIEW  COMPARISON:  Chest radiograph December 20, 2014  FINDINGS: Cardiomediastinal silhouette is unremarkable. The lungs are clear without pleural effusions or focal consolidations. Trachea projects midline and there is no pneumothorax. Soft tissue planes and included osseous structures are non-suspicious.  IMPRESSION: No acute  cardiopulmonary process ; stable chest radiograph from December 20, 2014.   Electronically Signed   By: Elon Alas   On: 01/26/2015 01:21    Scheduled Meds: . aspirin  324 mg Oral Once  . aspirin  81 mg Oral Daily  . atorvastatin  10 mg Oral q1800  . desvenlafaxine  100 mg Oral Daily  . enoxaparin (LOVENOX) injection  40 mg Subcutaneous Q24H  . gabapentin  600 mg Oral Q supper  . hydrochlorothiazide  12.5 mg Oral Daily  . insulin aspart  0-5 Units Subcutaneous QHS  . insulin aspart  0-9 Units Subcutaneous TID WC  . lamoTRIgine  75 mg Oral Daily  . levothyroxine  50 mcg Oral QAC breakfast  . lisinopril  20 mg Oral Daily  . loratadine  10 mg Oral Daily  . pantoprazole  40 mg Oral Daily  . sodium chloride  3 mL Intravenous Q12H   Continuous Infusions: . sodium chloride 75 mL/hr at 01/26/15 0534    Active Problems:   Diabetes mellitus type 2 with retinopathy   Acute chest pain   Chest pain    Time spent: 15 minutes.    Marshawn Normoyle  [facility] Pager [phone]. If 7PM-7AM, please contact night-coverage at [email or web address], password [facility] 01/26/2015, 7:38 AM  LOS: 1 day

## 2015-01-27 ENCOUNTER — Other Ambulatory Visit: Payer: Self-pay | Admitting: Physician Assistant

## 2015-01-27 DIAGNOSIS — E11319 Type 2 diabetes mellitus with unspecified diabetic retinopathy without macular edema: Secondary | ICD-10-CM | POA: Diagnosis not present

## 2015-01-27 DIAGNOSIS — I471 Supraventricular tachycardia: Secondary | ICD-10-CM

## 2015-01-27 DIAGNOSIS — R9431 Abnormal electrocardiogram [ECG] [EKG]: Secondary | ICD-10-CM

## 2015-01-27 DIAGNOSIS — E785 Hyperlipidemia, unspecified: Secondary | ICD-10-CM

## 2015-01-27 DIAGNOSIS — I10 Essential (primary) hypertension: Secondary | ICD-10-CM

## 2015-01-27 DIAGNOSIS — R079 Chest pain, unspecified: Secondary | ICD-10-CM | POA: Diagnosis not present

## 2015-01-27 LAB — COMPREHENSIVE METABOLIC PANEL
ALBUMIN: 3.8 g/dL (ref 3.5–5.2)
ALT: 30 U/L (ref 0–35)
AST: 44 U/L — ABNORMAL HIGH (ref 0–37)
Alkaline Phosphatase: 72 U/L (ref 39–117)
Anion gap: 9 (ref 5–15)
BILIRUBIN TOTAL: 0.8 mg/dL (ref 0.3–1.2)
BUN: 13 mg/dL (ref 6–23)
CHLORIDE: 95 mmol/L — AB (ref 96–112)
CO2: 33 mmol/L — ABNORMAL HIGH (ref 19–32)
Calcium: 9.3 mg/dL (ref 8.4–10.5)
Creatinine, Ser: 0.9 mg/dL (ref 0.50–1.10)
GFR calc non Af Amer: 67 mL/min — ABNORMAL LOW (ref >90)
GFR, EST AFRICAN AMERICAN: 78 mL/min — AB (ref >90)
Glucose, Bld: 254 mg/dL — ABNORMAL HIGH (ref 70–99)
POTASSIUM: 4.4 mmol/L (ref 3.5–5.1)
SODIUM: 137 mmol/L (ref 135–145)
TOTAL PROTEIN: 6.8 g/dL (ref 6.0–8.3)

## 2015-01-27 LAB — CBC
HCT: 39.1 % (ref 36.0–46.0)
HEMOGLOBIN: 13 g/dL (ref 12.0–15.0)
MCH: 30.4 pg (ref 26.0–34.0)
MCHC: 33.2 g/dL (ref 30.0–36.0)
MCV: 91.4 fL (ref 78.0–100.0)
Platelets: 254 10*3/uL (ref 150–400)
RBC: 4.28 MIL/uL (ref 3.87–5.11)
RDW: 13 % (ref 11.5–15.5)
WBC: 9.7 10*3/uL (ref 4.0–10.5)

## 2015-01-27 LAB — MAGNESIUM: Magnesium: 1.6 mg/dL (ref 1.5–2.5)

## 2015-01-27 LAB — GLUCOSE, CAPILLARY
GLUCOSE-CAPILLARY: 184 mg/dL — AB (ref 70–99)
Glucose-Capillary: 235 mg/dL — ABNORMAL HIGH (ref 70–99)

## 2015-01-27 MED ORDER — ALPRAZOLAM 0.25 MG PO TABS
ORAL_TABLET | ORAL | Status: AC
  Filled 2015-01-27: qty 2

## 2015-01-27 MED ORDER — METOPROLOL TARTRATE 25 MG PO TABS: 12.5000 mg | ORAL_TABLET | Freq: Two times a day (BID) | ORAL | Status: DC

## 2015-01-27 NOTE — Progress Notes (Addendum)
Patient: Meredith Hayden / Admit Date: 01/25/2015 / Date of Encounter: 01/27/2015, 8:55 AM   Subjective: C/o feeling "twitchy" - says her abdomen feels like a baby is kicking. Also reports headaches. Says "I feel like a train wreck and I'm the train." Last night reported sensation of peppermint in her toes and ears.   Fortunately no further cardiac sx - no CP or SOB. Tele stable.   Objective: Telemetry: NSR Physical Exam: Blood pressure 123/68, pulse 66, temperature 98.1 F (36.7 C), temperature source Oral, resp. rate 18, weight 233 lb 7.5 oz (105.9 kg), last menstrual period 12/31/1993, SpO2 92 %. General: Well developed, well nourished obese F, in no acute distress. Head: Normocephalic, atraumatic, sclera non-icteric, no xanthomas, nares are without discharge. Neck: JVP not elevated. Lungs: Clear bilaterally to auscultation without wheezes, rales, or rhonchi. Breathing is unlabored. Heart: RRR S1 S2 without murmurs, rubs, or gallops.  Abdomen: Soft, non-tender, non-distended with normoactive bowel sounds. No rebound/guarding. Extremities: No clubbing or cyanosis. No edema. Distal pedal pulses are 2+ and equal bilaterally. Neuro: Alert and oriented X 3. Moves all extremities spontaneously. Twitching not observed. Psych:  Responds to questions appropriately with a normal affect.   Intake/Output Summary (Last 24 hours) at 01/27/15 0855 Last data filed at 01/27/15 0831  Gross per 24 hour  Intake    600 ml  Output      0 ml  Net    600 ml    Inpatient Medications:  . aspirin  324 mg Oral Once  . aspirin  81 mg Oral Daily  . atorvastatin  10 mg Oral q1800  . desvenlafaxine  100 mg Oral Daily  . enoxaparin (LOVENOX) injection  40 mg Subcutaneous Q24H  . gabapentin  600 mg Oral Q supper  . hydrochlorothiazide  12.5 mg Oral Daily  . insulin aspart  0-5 Units Subcutaneous QHS  . insulin aspart  0-9 Units Subcutaneous TID WC  . insulin glargine  15 Units Subcutaneous Daily    . lamoTRIgine  75 mg Oral Daily  . levothyroxine  50 mcg Oral QAC breakfast  . lisinopril  20 mg Oral Daily  . loratadine  10 mg Oral Daily  . metoprolol tartrate  12.5 mg Oral BID  . pantoprazole  40 mg Oral Daily  . sodium chloride  3 mL Intravenous Q12H   Infusions:  . sodium chloride 75 mL/hr at 01/26/15 0534    Labs:  Recent Labs  01/26/15 0528 01/27/15 0555  NA 135 137  K 4.1 4.4  CL 99 95*  CO2 30 33*  GLUCOSE 233* 254*  BUN 19 13  CREATININE 1.20* 0.90  CALCIUM 9.2 9.3    Recent Labs  01/26/15 0528 01/27/15 0555  AST 41* 44*  ALT 28 30  ALKPHOS 74 72  BILITOT 0.6 0.8  PROT 6.8 6.8  ALBUMIN 4.0 3.8    Recent Labs  01/25/15 2257  01/26/15 0528 01/27/15 0555  WBC 11.2*  --  9.7 9.7  NEUTROABS 5.5  --   --   --   HGB 14.4  < > 13.6 13.0  HCT 42.0  < > 41.0 39.1  MCV 91.1  --  90.5 91.4  PLT 253  --  251 254  < > = values in this interval not displayed.  Recent Labs  01/26/15 0247 01/26/15 0810 01/26/15 1526  CKTOTAL 185* 156 173  CKMB 2.9 2.2 2.0  TROPONINI <0.03 <0.03 <0.03   Invalid input(s): POCBNP  Recent Labs  01/26/15 0528  HGBA1C 9.8*     Radiology/Studies:  Ct Angio Chest Pe W/cm &/or Wo Cm  01/26/2015   CLINICAL DATA:  Upper chest pain, non focal. Slightly elevated D-dimer.  EXAM: CT ANGIOGRAPHY CHEST WITH CONTRAST  TECHNIQUE: Multidetector CT imaging of the chest was performed using the standard protocol during bolus administration of intravenous contrast. Multiplanar CT image reconstructions and MIPs were obtained to evaluate the vascular anatomy.  CONTRAST:  126mL OMNIPAQUE IOHEXOL 350 MG/ML SOLN  COMPARISON:  Radiograph 01/26/2015  FINDINGS: Cardiovascular: There is good opacification of the pulmonary arteries. There is no pulmonary embolism. The thoracic aorta is normal in caliber and intact.  Lungs: Clear  Central airways: Patent  Effusions: None  Lymphadenopathy: None  Esophagus: Unremarkable  Upper abdomen: Fatty liver   Musculoskeletal: No significant abnormalities.  Review of the MIP images confirms the above findings.  IMPRESSION: Negative for pulmonary embolism.  No acute findings are evident.  Fatty liver.   Electronically Signed   By: Andreas Newport M.D.   On: 01/26/2015 10:33   Dg Chest Port 1 View  01/26/2015   CLINICAL DATA:  Diffuse chest pain, shortness of breath. History of asthma.  EXAM: PORTABLE CHEST - 1 VIEW  COMPARISON:  Chest radiograph December 20, 2014  FINDINGS: Cardiomediastinal silhouette is unremarkable. The lungs are clear without pleural effusions or focal consolidations. Trachea projects midline and there is no pneumothorax. Soft tissue planes and included osseous structures are non-suspicious.  IMPRESSION: No acute cardiopulmonary process ; stable chest radiograph from December 20, 2014.   Electronically Signed   By: Elon Alas   On: 01/26/2015 01:21     Assessment and Plan  1. Chest pain likely in the setting of SVT 2. 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 HTN 4. DM 2 5. Dyslipidemia 6. Other noncardiac somatic complaints as above 7. QT prolongation of unclear cause with NSIVCD   Workup thus far with CTA neg for PE. TSH wnl. R/o for MI. 2D Echo EF 55-60%, no RWMA, grade 1 DD, mild MR - nothing to explain findings. 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 Nuclear stress test normal per Dr. Kennon Holter interpretation. Unfortunately strips from SVT remain MIA. Tele stable.  Regarding QT prolongation, etiology not clear - 480's in the past. Would avoid QT prolonging drugs in the future - list placed in patient's discharge paperwork. Add on Mg. If this is normal, OK to d/c from cardiac standpoint. Will arrange cardiac event monitor and f/u with Dr. Radford Pax - office will call her to schedule. (Pt prefers to f/u Dr. Radford Pax.)  The patient feels her strange symptoms coincide with starting gabapentin - consider cessation.    Signed, Melina Copa PA-C  Agree with findings by Melina Copa PA-C  Cardiac w/u neg.  Enz neg. 2D nl. Myoview neg. CTA neg for PE. She does have AL TWI which are more pronounced since last EKG 04/14/2008. QTc increased as well for unclear reasons. On BB. OK for DC from our point of view with 30 day event monitor. ROV with Dr. Radford Pax.  Lorretta Harp, M.D., [place], [facility], [place] [address] [place], [place]  [id number]  [phone] 01/27/2015 9:19 AM

## 2015-01-27 NOTE — Progress Notes (Signed)
Inpatient Diabetes Program Recommendations  AACE/ADA: New Consensus Statement on Inpatient Glycemic Control (2013)  Target Ranges:  Prepandial:   less than 140 mg/dL      Peak postprandial:   less than 180 mg/dL (1-2 hours)      Critically ill patients:  140 - 180 mg/dL   Inpatient Diabetes Program Recommendations Insulin - Basal: Increase Lantus to 20 units  Thank you  Raoul Pitch BSN, RN,CDE Inpatient Diabetes Coordinator [phone] (team pager)

## 2015-01-27 NOTE — Progress Notes (Signed)
I also called the patient to review the issue of longer QT. Takehome point is that when being prescribed a new med she knows to mention this to her prescribing physician. Estuardo Frisbee PA-C

## 2015-01-27 NOTE — Discharge Instructions (Signed)
One of your intervals on your EKG is prolonged.  There are certain medications you should avoid that can prolong this interval. When receiving a new prescription, mention to your doctor that you have a history of a long QT interval on your EKG.  A - Z List  Adrenaline (Epinephrine)  Stimulant   Ajmaline*  Antiarrhythmic (Class 1)   Alfuzosin  Alpha1-blocker   Almokalant*  Antiarrhythmic (Class 3)   Amantidine  Antimalarial   Amiodarone  Antiarrhythmic (Class 3)   Amitriptyline  Psychiatric drug - Tricyclic antidepressant   Amoxapine*  Psychiatric drug - Tricyclic antidepressant   Amphetamine  Stimulant   Arsenic trioxide  Anti-cancer   Astemizole*  Antihistamine   Atomoxetine  Psychiatric drug - Other   Azelastine  Antihistamine   Azimilide*  Antiarrhythmic (Class 3)   Azithromycin  Antibiotic - Macrolide   Bepridil*  Anti-anginal / vasodilator   Bretylium  Antiarrhythmic (Class 3)   Chloral hydrate  Sedative   Chloroquine  Antimalarial   Chlorpromazine  Psychiatric drug - Phenothiazine   Cibenzoline*  Antiarrhythmic (Class 1)   Ciprofloxacin  Antibiotic - Quinolone   Cisapride*  Serotonin agonist / antagonist   Citalopram  Psychiatric drug - Other   Clarithromycin  Antibiotic - Macrolide   Clobutinol*  Other   Clomipramine  Psychiatric drug - Tricyclic antidepressant   Clozapine  Anti-psychotic   Cocaine  Stimulant   Cold remedies  May contain stimulant drugs - always to check the label   Cotrimoxazole  Antibiotic - Antifungal   d-sotalol*  Antiarrhythmic (Class 3)   Desipramine*  Psychiatric drug - Tricyclic antidepressant   Dexmethylphenidate  Stimulant   Dihydroquinidine*  Antiarrhythmic (Class 1)   Diphenhydramine  Antihistamine   Disopyramide  Antiarrhythmic (Class 1)   Dobutamine  Stimulant   Dofetilide*  Antiarrhythmic (Class 3)   Dolasetron  Serotonin agonist / antagonist   Domperidone  Other   Dopamine  Stimulant   Doxepin  Psychiatric drug - Tricyclic  antidepressant   Dronedarone*  Antiarrhythmic (Class 3)   Droperidol*  Psychiatric drug - Other   Ebastine*  Antihistamine   Encainide*  Antiarrhythmic (Class 1)   Ephedrine  Stimulant   Ersentilide*  Antiarrhythmic (Class 3)   Erythromycin  Antibiotic - Macrolide   Felbamate*  Anticonvulsant   Fenfluramine  Stimulant   Flecainide  Antiarrhythmic (Class 1)   Fluconazole  Antibiotic - Antifungal   Fluoxetine  Psychiatric drug - Other   Fluphenazine  Psychiatric drug - Phenothiazine   Foscarnet (HIV)  Antibiotics - Antiviral   Fosphenytoin (prodrug of Phenytoin)  Anticonvulsant   Galantamine  Inhibitor (dementia, Alzheimers)   Gatifloxacin*  Antibiotic - Quinolone   Geldanamycin*  Anti-cancer   Gemifloxacin*  Antibiotic - Quinolone   Granisetron  Serotonin agonist / antagonist   Grepafloxacin*  Antibiotic - Quinolone   Halofantrine*  Antimalarial   Haloperidol  Psychiatric drug - Other   Hydroxyzine  Antihistamine   Ibutilide*  Antiarrhythmic (Class 3)   Imipramine  Psychiatric drug - Tricyclic antidepressant   Indapamide  Anti-hypertensive   Isoprenaline (Isoproterenol)  Stimulant   Isradipine  Anti-hypertensive   Itraconazole  Antibiotic - Antifungal   Ketanserin*  Serotonin agonist / antagonist   Ketoconazole  Antibiotic - Antifungal   Levalbuterol  Stimulant   Levofloxacin  Antibiotic - Quinolone   Levomethadyl*  Psychiatric drug - Other   Lidoflazine*  Anti-anginal / vasodilator   Lithium  Anti-mania   Maprotiline  Psychiatric drug - Other   Mesoridazine  Psychiatric drug - Other   Metaproterenol  Stimulant   Methadone  Psychiatric drug - Other   Methylphenidate  Stimulant   Mexiletine  Antiarrhythmic (Class 1)   Midodrine  Stimulant   Moexipril / Hydrochlorthiazide  Anti-hypertensive   Moxifloxacin  Antibiotic - Quinolone   Naratriptan  Anti-migraine   Nicardipine  Anti-hypertensive   Nifekalant*  Antiarrhythmic (Class 3)   Norepinephrine (Noradrenaline)   Stimulant   Nortriptyline  Psychiatric drug - Tricyclic antidepressant   Octreotide  Other   Ofloxacin  Antibiotic - Quinolone   Ondansetron  Serotonin agonist / antagonist   Organophosphates*  Other   Paroxetine  Anti-depressant   Pentamidine  Antibiotic - Other   Perflutren lipid microspheres  Other   Pericycline  Psychiatric drug - Other   Phentermine  Stimulant   Phenylephrine  Stimulant   Phenylpropanolamine  Stimulant   Pimozide  Psychiatric drug - Other   Pirmenol*  Antiarrhythmic (Class 1)   Prenylamine*  Anti-anginal / vasodilator   Probucol  Other   Procainamide  Antiarrhythmic (Class 1)   Prochlorperazine  Psychiatric drug - Phenothiazine   Propafenone  Antiarrhythmic (Class 1)   Protriptyline*  Psychiatric drug - Tricyclic antidepressant   Pseudoephidrine  Stimulant   Quetiapine  Psychiatric drug - Other   Quinidine*  Antiarrhythmic (Class 1)   Quinine  Antimalarial   Ranolazine  Anti-anginal / vasodilator   Risperidone  Psychiatric drug - Other   Ritodrine  Stimulant   Roxithromycin*  Antibiotic - Macrolide   Salbutamol (Albuterol)  Stimulant   Salmeterol  Stimulant   Sematilide*  Antiarrhythmic (Class 3)   Sertindole  Psychiatric drug - Other   Sertraline  Anti-depressant   Sibutramine  Stimulant   Solifenacin  Other   Sotalol  Antiarrhythmic (Class 3)   Sparfloxacin*  Antibiotic - Quinolone   Spiramycin  Antibiotic - Macrolide   Sumatriptan   Anti-migraine   Sunitib  Anti-cancer   Tacrolimus   Anti-cancer   Tamoxifen  Anti-cancer   Telithromycin  Antibiotic - Macrolide   Terbutaline  Stimulant   Terfenadine*  Antihistamine   Terikalant*  Antiarrhythmic (Class 3)   Terodiline*  Anti-anginal / vasodilator   Thioridazine*  Psychiatric drug - Phenothiazine   Tizanidine  Other   Tolterodine  Bladder antispasmodic   Trazodone  Psychiatric drug - Other   Trifluoperazine  Psychiatric drug - Phenothiazine   Trimethoprim sulfa (bactrim)  Antibiotic - Other    Trimipramine  Psychiatric drug - Tricyclic antidepressant   Vardenafil  Anti-anginal / vasodilator   Vasopressin  Other   Venlafaxine  Psychiatric drug - Other   Voriconazole  Antibiotic - Antifungal   Zimeldine*  Psychiatric drug - Other   Ziprasidone  Psychiatric drug - Other   Zolmitriptan   Anti-migraine  Listed by type Antiarrhythmics Class 1: ajmaline*, cibenzoline*, dihydroquinidine*, disopyramide, encainide*, flecainide, mexiletine, pirmenol*, procainamide, propafenone quinidine* Class 3: almokalant*, amiodarone, azimilide*, bretylium, dofetilide*, dronedarone*, d-sotalol*, ersentilide*, ibutilide*, nifekalant*, sematilide*, sotalol, terikalant* Anti-anginals/vasodilators bepridil*, lidoflazine*, prenylamine*, ranolazine, terodiline*, vardenafil Anti-hypertensives indapamide, isradipine, moexipril/hydrochlorthiazide, nicardipine Antihistamines astemizole*, azelastine, diphenhydramine, ebastine*, hydroxyzine, terfenadine* Serotonin agonists and antagonists cisapride*, dolasetron, granisetron, ketanserin*, ondansetron Antimicrobials Macrolide antibiotics: azithromycin, clarithromycin, erythromycin, roxithromycin*, spiramycin, telithromycin Quinolone antibiotics: ciprofloxacin, gatifloxacin*, gemifloxacin*, grepafloxacin*, levofloxacin, moxifloxacin, ofloxacin, sparfloxacin* Antifungals: cotrimoxazole, fluconazole (caution with itraconazole), ketoconazole, voriconazole Others: pentamidine, trimethoprim sulfa (bactrim) Antiviral: foscarnet (HIV) Antimalarials amantidine, chloroquine, halofantrine*, quinine Psychiatric drugs Tricyclic antidepressants: amitriptyline, amoxapine*, clomipramine, desipramine*, doxepin, imipramine, nortriptyline, protriptyline*, trimipramine Phenothiazines: chlorpromazine, fluphenazine, prochlorperazine, thioridazine*, trifluoperazine Others: atomoxetine, citalopram, clozapine,  droperidol*, fluoxetine, haloperidol, levomethadyl*, lithium, maprotiline,  mesoridazine, methadone, paroxetine, pericycline, pimozide, quetiapine, risperidone, sertindole, sertraline, trazodone, venlafaxine, zimeldine*, ziprasidone Anticonvulsant felbamate*, fosphenytoin (prodrug of phenytoin) Anti-migraine naratriptan, sumatriptan, zolmitriptan Anti-cancer arsenic trioxide, geldanamycin*, sunitib, tacrolimus, tamoxifen Others alfuzosin, chloral hydrate, clobutinol*, domperidone, galantamine, octreotide, organophosphates*, perflutren lipid microspheres, probucol, solifenacin, tizanidine, tolterodine, vasopressin  Stimulant drugs Some cold remedies contain these drugs so it is important always to check the label. adrenaline (epinephrine), amphetamine, cocaine, dexmethylphenidate, dobutamine, dopamine, ephedrine, fenfluramine, isoprenaline (isoproterenol), levalbuterol, metaproterenol, methylphenidate, midodrine, norepinephrine (noradrenaline), phentermine, phenylephrine, phenylpropanolamine, pseudoephidrine, ritodrine, salbutamol (albuterol), salmeterol, sibutramine, terbutaline

## 2015-01-27 NOTE — Discharge Summary (Signed)
Meredith Hayden, is a 65 y.o. female  DOB April 17, 1950  MRN [id number].  Admission date:  01/25/2015  Admitting Physician  Jani Gravel, MD  Discharge Date:  01/27/2015   Primary MD  Mauricio Po, FNP  Recommendations for primary care physician for things to follow:   Follow Cardiology recommendations regarding QT prolongation- patient will have 30 day event monitor arranged by cardiology. Optimize diabetes medications.   Admission Diagnosis  PSVT (paroxysmal supraventricular tachycardia) [I47.1] Acute chest pain [R07.9] Chest pain [R07.9] Ischemic chest pain [I20.9]   Discharge Diagnosis  PSVT (paroxysmal supraventricular tachycardia) [I47.1] Acute chest pain [R07.9] Chest pain [R07.9] Ischemic chest pain [I20.9]    Active Problems:   Hyperlipidemia   Essential hypertension   Diabetes mellitus type 2 with retinopathy   PSVT (paroxysmal supraventricular tachycardia)   QT prolongation      Past Medical History  Diagnosis Date  . Allergy   . Anxiety   . Asthma   . Depression   . Diabetes mellitus   . Hyperlipidemia   . Hypertension   . IBS (irritable bowel syndrome)   . Thyroid disease   . Arthritis   . OSA (obstructive sleep apnea)   . Dyspareunia     vaginal dryness  . Blood transfusion without reported diagnosis 2002    with knee replacement  . Fibroid     reason for hysterectomy  . STD (sexually transmitted disease)     Hx HPV  . Osteopenia 2009  . Diabetes mellitus type 2 with retinopathy   . Diverticulitis   . GERD (gastroesophageal reflux disease)     Past Surgical History  Procedure Laterality Date  . Partial colectomy      -RSO  . Total knee arthroplasty Bilateral 2002, 2004  . Oophorectomy    . Abdominal hysterectomy  1995    TAH/LSO--Dr. Ubaldo Glassing  . Rotator cuff repair Left   .  Cesarean section  1978  . Foot surgery Bilateral   . Nasal sinus surgery         History of present illness and  Hospital Course:     Kindly see H&P for history of present illness and admission details, please review complete Labs, Consult reports and Test reports for all details in brief  HPI  from the history and physical done on the day of admission    Meredith Hayden is a pleasant 65 year old female with Dm2, htn, hyperlipidemia, among other medical problems, who came in with chest pain concerning for acute coronary syndrome. Pt was treated with adenosine for high hr concerning for SVT in the ED.She had some EKG changes and her d-dimer was slightly elevated at 0.53. CTA chest with contrast was negative for PE. Patient was seen by cardiology who performed a nuclear stress test which was normal. 2-D echocardiogram showed no wall motion abnormalities. EKG showed some QT prolongation. Cardiology has recommended a 30 day event monitor. Patient will also need adjustment of her psychiatric medications in view of QT prolongation.  Will defer this to a psychiatrist/PCP. Her presentation may be related to withdrawal from medications. She mentions that she was taken off benzodiazepine cold [place]. Will defer to her PCP for further adjustments of her medications. Patient was discharged on beta blocker per cardiology recommendations. She will need optimization of her diabetes medications as her hemoglobin A1c remains at 9.8. Otherwise she is discharged in stable condition to follow with her PCP in the next 1-2 weeks.   Discharge Condition: Stable   Follow UP  Follow-up Information    Follow up with Sueanne Margarita, MD.   Specialty:  Cardiology   Why:  Office will call you for your followup appointment and arrange a heart monitor for you to wear. Call office if you have not heard back in 3 days.   Contact information:   [address] [place]  [address] [phone]         Discharge Instructions  and  Discharge Medications   Discharge Instructions    Diet - low sodium heart healthy    Complete by:  As directed      Diet Carb Modified    Complete by:  As directed      Increase activity slowly    Complete by:  As directed             Medication List    STOP taking these medications        AFLURIA PRESERVATIVE FREE 0.5 ML Susy  Generic drug:  Influenza Virus Vacc Split PF     multivitamin tablet      TAKE these medications        ALPRAZolam 1 MG tablet  Commonly known as:  XANAX  Take 0.5-2 mg by mouth every 4 (four) hours as needed for anxiety or sleep. Take 1-2 tabs up to 6 times daily     aspirin 81 MG tablet  Take 81 mg by mouth daily.     atorvastatin 10 MG tablet  Commonly known as:  LIPITOR  TAKE 1 TABLET DAILY OR AS DIRECTED.     CALCIUM 1200 PO  Take 1 tablet by mouth daily.     cetirizine 10 MG tablet  Commonly known as:  ZYRTEC  Take 10 mg by mouth daily.     co-enzyme Q-10 30 MG capsule  Take 30 mg by mouth daily.     cyanocobalamin 100 MCG tablet  Take 100 mcg by mouth daily.     desvenlafaxine 100 MG 24 hr tablet  Commonly known as:  PRISTIQ  Take 100 mg by mouth daily.     gabapentin 600 MG tablet  Commonly known as:  NEURONTIN  Take 600 mg by mouth daily with supper.     Garlic 824 MG Tabs  Take 1 tablet by mouth daily.     GLIPIZIDE XL 10 MG 24 hr tablet  Generic drug:  glipiZIDE  TAKE 1 TABLET DAILY WITH BREAKFAST.     glucose blood test strip  Commonly known as:  ONETOUCH VERIO  1 each by Other route 2 (two) times daily. Use as instructed     Insulin Glargine 100 UNIT/ML Solostar Pen  Commonly known as:  LANTUS SOLOSTAR  Inject 30 Units into the skin at bedtime.     Insulin Pen Needle 31G X 5 MM Misc  Commonly known as:  EASY COMFORT PEN NEEDLES  Use 1x a day     lamoTRIgine 150 MG tablet  Commonly known as:  LAMICTAL  Take 75 mg by mouth daily.      levothyroxine 50 MCG tablet  Commonly known as:  SYNTHROID, LEVOTHROID  Take 1 tablet (50 mcg total) by mouth daily before breakfast.     lisinopril-hydrochlorothiazide 20-12.5 MG per tablet  Commonly known as:  PRINZIDE,ZESTORETIC  TAKE 1 TABLET ONCE DAILY.     metoprolol tartrate 25 MG tablet  Commonly known as:  LOPRESSOR  Take 0.5 tablets (12.5 mg total) by mouth 2 (two) times daily.     Omega-3 350 MG Caps  Take 350 mg by mouth daily.     omeprazole 20 MG capsule  Commonly known as:  PRILOSEC  Take 20 mg by mouth daily.     ONETOUCH DELICA LANCETS 42V Misc  1 each by Does not apply route 2 (two) times daily.     ONETOUCH VERIO W/DEVICE Kit  1 each by Does not apply route daily.     PROAIR HFA 108 (90 BASE) MCG/ACT inhaler  Generic drug:  albuterol  USE 2 PUFFS EVERY 6 HOURS AS NEEDED.     vitamin C 1000 MG tablet  Take 1,000 mg by mouth daily.     Vitamin D3 1000 UNITS Caps  Take 1 capsule by mouth daily.          Diet and Activity recommendation: See Discharge Instructions above   Consults obtained - cardiology   Major procedures and Radiology Reports - PLEASE review detailed and final reports for all details, in brief -   Nuclear stress test   Ct Angio Chest Pe W/cm &/or Wo Cm  01/26/2015   CLINICAL DATA:  Upper chest pain, non focal. Slightly elevated D-dimer.  EXAM: CT ANGIOGRAPHY CHEST WITH CONTRAST  TECHNIQUE: Multidetector CT imaging of the chest was performed using the standard protocol during bolus administration of intravenous contrast. Multiplanar CT image reconstructions and MIPs were obtained to evaluate the vascular anatomy.  CONTRAST:  152m OMNIPAQUE IOHEXOL 350 MG/ML SOLN  COMPARISON:  Radiograph 01/26/2015  FINDINGS: Cardiovascular: There is good opacification of the pulmonary arteries. There is no pulmonary embolism. The thoracic aorta is normal in caliber and intact.  Lungs: Clear  Central airways: Patent  Effusions: None  Lymphadenopathy:  None  Esophagus: Unremarkable  Upper abdomen: Fatty liver  Musculoskeletal: No significant abnormalities.  Review of the MIP images confirms the above findings.  IMPRESSION: Negative for pulmonary embolism.  No acute findings are evident.  Fatty liver.   Electronically Signed   By: DAndreas NewportM.D.   On: 01/26/2015 10:33   Dg Chest Port 1 View  01/26/2015   CLINICAL DATA:  Diffuse chest pain, shortness of breath. History of asthma.  EXAM: PORTABLE CHEST - 1 VIEW  COMPARISON:  Chest radiograph December 20, 2014  FINDINGS: Cardiomediastinal silhouette is unremarkable. The lungs are clear without pleural effusions or focal consolidations. Trachea projects midline and there is no pneumothorax. Soft tissue planes and included osseous structures are non-suspicious.  IMPRESSION: No acute cardiopulmonary process ; stable chest radiograph from December 20, 2014.   Electronically Signed   By: CElon Alas  On: 01/26/2015 01:21    Micro Results    No results found for this or any previous visit (from the past 240 hour(s)).     Today   Subjective:   JTamala Manzertoday has no headache,no chest abdominal pain,no new weakness tingling or numbness, feels much better wants to go home today.   Objective:   Blood pressure 106/54, pulse 65, temperature 98.2  F (36.8 C), temperature source Oral, resp. rate 18, weight 105.9 kg (233 lb 7.5 oz), last menstrual period 12/31/1993, SpO2 95 %.   Intake/Output Summary (Last 24 hours) at 01/27/15 1410 Last data filed at 01/27/15 1300  Gross per 24 hour  Intake    840 ml  Output      0 ml  Net    840 ml    Exam Awake Alert, Oriented x 3, No new F.N deficits, Normal affect [place].AT,PERRAL Supple Neck,No JVD, No cervical lymphadenopathy appriciated.  Symmetrical Chest wall movement, Good air movement bilaterally, CTAB RRR,No Gallops,Rubs or new Murmurs, No Parasternal Heave +ve B.Sounds, Abd Soft, Non tender, No organomegaly appriciated, No rebound  -guarding or rigidity. No Cyanosis, Clubbing or edema, No new Rash or bruise  Data Review   CBC w Diff: Lab Results  Component Value Date   WBC 9.7 01/27/2015   HGB 13.0 01/27/2015   HCT 39.1 01/27/2015   PLT 254 01/27/2015   LYMPHOPCT 37 01/25/2015   MONOPCT 9 01/25/2015   EOSPCT 4 01/25/2015   BASOPCT 1 01/25/2015    CMP: Lab Results  Component Value Date   NA 137 01/27/2015   K 4.4 01/27/2015   CL 95* 01/27/2015   CO2 33* 01/27/2015   BUN 13 01/27/2015   CREATININE 0.90 01/27/2015   CREATININE 0.95 12/09/2014   PROT 6.8 01/27/2015   ALBUMIN 3.8 01/27/2015   BILITOT 0.8 01/27/2015   ALKPHOS 72 01/27/2015   AST 44* 01/27/2015   ALT 30 01/27/2015  .   Total Time in preparing paper work, data evaluation and todays exam - 35 minutes  Deondria Puryear M.D on 01/27/2015 at 2:10 PM  [facility] Office  [phone]

## 2015-01-27 NOTE — Progress Notes (Signed)
Pt discharged home Discharge instructions given & reviewed Education discussed  IV dc'd  Tele dc'd  Pt discharged via wheelchair with volunteer services All pt belongs at side.  Sherrie Mustache 2:55 PM

## 2015-01-31 ENCOUNTER — Encounter: Payer: Self-pay | Admitting: Family

## 2015-01-31 ENCOUNTER — Ambulatory Visit (INDEPENDENT_AMBULATORY_CARE_PROVIDER_SITE_OTHER): Payer: Medicare Other | Admitting: Family

## 2015-01-31 VITALS — BP 144/98 | HR 74 | Temp 98.7°F | Resp 18 | Ht 61.5 in | Wt 229.1 lb

## 2015-01-31 DIAGNOSIS — I471 Supraventricular tachycardia: Secondary | ICD-10-CM

## 2015-01-31 MED ORDER — IPRATROPIUM BROMIDE HFA 17 MCG/ACT IN AERS: 2.0000 | INHALATION_SPRAY | RESPIRATORY_TRACT | Status: DC | PRN

## 2015-01-31 NOTE — Progress Notes (Signed)
Pre visit review using our clinic review tool, if applicable. No additional management support is needed unless otherwise documented below in the visit note. 

## 2015-01-31 NOTE — Telephone Encounter (Signed)
Pt was called and informed. She is aware that she should only take 1 tablet in the morning and 1 tablet in the evening to equal 50 mg a day

## 2015-01-31 NOTE — Assessment & Plan Note (Addendum)
Patient continues to experience mild symptoms of chest discomfort. Auscultation reveals regular rate and rhythm. Given hospital workup no further cardiac workup is needed this time. This may possibly be related to anxiety. She is following up with her psychiatrist to determine need for anxiety medications. Increase metoprolol to 25 mg twice a day. Hold on albuterol. Start ipratropium for wheezing. Will follow-up upon completion of Holter monitor. Patient advised to seek further care or emergency care if symptoms increase or redevelop.

## 2015-01-31 NOTE — Patient Instructions (Signed)
Increase metoprolol to 25 mg 2x daily  Start Atrovent as needed for wheezing first. Hold on albuterol for now.

## 2015-01-31 NOTE — Progress Notes (Signed)
Subjective:    Patient ID: Meredith Hayden, female    DOB: 22-Feb-1950, 65 y.o.   MRN: [id number]  Chief Complaint  Patient presents with  . Hospitalization Follow-up    went in to svt and had to be sent to ER, says shes doing ok says she still feels shakey    HPI:  Meredith Hayden is a 65 y.o. female who presents today for hospital follow up.   On January 27 was admitted to [facility] for chest pain on the right and left side and described as heaviness with radiation to both arms.. She was noted to have SVT in the emergency room for which they treated with adenosine. Troponins and stress tests in the hospital were negative. A CTA of the chest was performed for PE which was negative. A 2-D echocardiogram was performed with no wall motion abnormalities. EKG shows some QT prolongation. All other hospital records were reviewed in detail.  Since being discharged from the hospital, states she has occasional feeling of chest discomfort and heaviness. Indicates it feels like her heart is pounding on occasion and feels like it is working hard. She is not currently experiencing any of these symptoms  She is scheduled to see cardiology and have a Holter monitor placed for a month to determine cardiac events.  Allergies  Allergen Reactions  . Oxycodone-Acetaminophen Anaphylaxis    Patient states that she can regular tylenol without any problems  . Codeine Phosphate     REACTION: unspecified  . Erythromycin Other (See Comments)    Dizzy and sweaty   . Morphine Sulfate Itching    Tolerated hydromorphone   . Oxycodone Hcl     REACTION: unspecified  . Sulfamethoxazole     REACTION: rash    Current Outpatient Prescriptions on File Prior to Visit  Medication Sig Dispense Refill  . ALPRAZolam (XANAX) 1 MG tablet Take 0.5-2 mg by mouth every 4 (four) hours as needed for anxiety or sleep. Take 1-2 tabs up to 6 times daily    . Ascorbic Acid (VITAMIN C) 1000 MG tablet Take  1,000 mg by mouth daily.    Marland Kitchen aspirin 81 MG tablet Take 81 mg by mouth daily.     Marland Kitchen atorvastatin (LIPITOR) 10 MG tablet TAKE 1 TABLET DAILY OR AS DIRECTED. 90 tablet 1  . Blood Glucose Monitoring Suppl (ONETOUCH VERIO) W/DEVICE KIT 1 each by Does not apply route daily. 1 kit 0  . Calcium Carbonate-Vit D-Min (CALCIUM 1200 PO) Take 1 tablet by mouth daily.    . cetirizine (ZYRTEC) 10 MG tablet Take 10 mg by mouth daily.     . Cholecalciferol (VITAMIN D3) 1000 UNITS CAPS Take 1 capsule by mouth daily.      Marland Kitchen co-enzyme Q-10 30 MG capsule Take 30 mg by mouth daily.      . cyanocobalamin 100 MCG tablet Take 100 mcg by mouth daily.      Marland Kitchen desvenlafaxine (PRISTIQ) 100 MG 24 hr tablet Take 100 mg by mouth daily.      Marland Kitchen gabapentin (NEURONTIN) 600 MG tablet Take 600 mg by mouth daily with supper.    . Garlic 979 MG TABS Take 1 tablet by mouth daily.      Marland Kitchen GLIPIZIDE XL 10 MG 24 hr tablet TAKE 1 TABLET DAILY WITH BREAKFAST. 30 tablet 2  . glucose blood (ONETOUCH VERIO) test strip 1 each by Other route 2 (two) times daily. Use as instructed 100 each 11  .  Insulin Glargine (LANTUS SOLOSTAR) 100 UNIT/ML Solostar Pen Inject 30 Units into the skin at bedtime. 15 mL 2  . Insulin Pen Needle (EASY COMFORT PEN NEEDLES) 31G X 5 MM MISC Use 1x a day 100 each 3  . lamoTRIgine (LAMICTAL) 150 MG tablet Take 75 mg by mouth daily.    Marland Kitchen levothyroxine (SYNTHROID, LEVOTHROID) 50 MCG tablet Take 1 tablet (50 mcg total) by mouth daily before breakfast. 30 tablet 0  . lisinopril-hydrochlorothiazide (PRINZIDE,ZESTORETIC) 20-12.5 MG per tablet TAKE 1 TABLET ONCE DAILY. 90 tablet 1  . metoprolol tartrate (LOPRESSOR) 25 MG tablet Take 0.5 tablets (12.5 mg total) by mouth 2 (two) times daily. 60 tablet 0  . Omega-3 350 MG CAPS Take 350 mg by mouth daily.      Marland Kitchen omeprazole (PRILOSEC) 20 MG capsule Take 20 mg by mouth daily.    [name] DELICA LANCETS 15Q MISC 1 each by Does not apply route 2 (two) times daily. 100 each 3  . PROAIR  HFA 108 (90 BASE) MCG/ACT inhaler USE 2 PUFFS EVERY 6 HOURS AS NEEDED. (Patient taking differently: USE 2 PUFFS EVERY 6 HOURS AS NEEDED FOR SHORTNESS OF BREATH) 8.5 g 4  . [DISCONTINUED] azelastine (ASTELIN) 137 MCG/SPRAY nasal spray 1 spray by Nasal route 2 (two) times daily. Use in each nostril as directed     . [DISCONTINUED] budesonide-formoterol (SYMBICORT) 160-4.5 MCG/ACT inhaler Inhale 2 puffs into the lungs 2 (two) times daily.      . [DISCONTINUED] diphenhydrAMINE (SOMINEX) 25 MG tablet Take 25 mg by mouth at bedtime as needed.      . [DISCONTINUED] fluticasone (FLONASE) 50 MCG/ACT nasal spray 2 sprays by Nasal route daily.      . [DISCONTINUED] temazepam (RESTORIL) 30 MG capsule Take 30 mg by mouth at bedtime as needed.     No current facility-administered medications on file prior to visit.    Review of Systems  Constitutional: Negative for fever and chills.  Respiratory: Positive for chest tightness. Negative for shortness of breath and wheezing.   Cardiovascular: Positive for chest pain (heavy) and palpitations (occasional). Negative for leg swelling.      Objective:    BP 144/98 mmHg  Pulse 74  Temp(Src) 98.7 F (37.1 C) (Oral)  Resp 18  Ht 5' 1.5" (1.562 m)  Wt 229 lb 1.9 oz (103.928 kg)  BMI 42.60 kg/m2  SpO2 95%  LMP 12/31/1993 Nursing note and vital signs reviewed.  Physical Exam  Constitutional: She is oriented to person, place, and time. She appears well-developed and well-nourished. No distress.  Cardiovascular: Normal rate, regular rhythm, normal heart sounds and intact distal pulses.   Pulmonary/Chest: Effort normal and breath sounds normal.  Neurological: She is alert and oriented to person, place, and time.  Skin: Skin is warm and dry.  Psychiatric: She has a normal mood and affect. Her behavior is normal. Judgment and thought content normal.       Assessment & Plan:

## 2015-01-31 NOTE — Telephone Encounter (Signed)
Please inform the patient that she should take 1 - 25 mg tablet twice daily as she was prescribed 1/2 pill per day.

## 2015-02-01 ENCOUNTER — Other Ambulatory Visit: Payer: Self-pay | Admitting: *Deleted

## 2015-02-01 MED ORDER — INSULIN GLARGINE 100 UNIT/ML SOLOSTAR PEN: PEN_INJECTOR | SUBCUTANEOUS | Status: DC

## 2015-02-18 ENCOUNTER — Other Ambulatory Visit: Payer: Self-pay | Admitting: Internal Medicine

## 2015-02-21 ENCOUNTER — Encounter: Payer: Self-pay | Admitting: Cardiology

## 2015-02-21 ENCOUNTER — Ambulatory Visit (INDEPENDENT_AMBULATORY_CARE_PROVIDER_SITE_OTHER): Payer: Medicare Other | Admitting: Cardiology

## 2015-02-21 VITALS — BP 124/76 | HR 59 | Ht 61.5 in | Wt 233.0 lb

## 2015-02-21 DIAGNOSIS — I471 Supraventricular tachycardia, unspecified: Secondary | ICD-10-CM

## 2015-02-21 DIAGNOSIS — E785 Hyperlipidemia, unspecified: Secondary | ICD-10-CM | POA: Diagnosis not present

## 2015-02-21 DIAGNOSIS — R0789 Other chest pain: Secondary | ICD-10-CM

## 2015-02-21 DIAGNOSIS — I10 Essential (primary) hypertension: Secondary | ICD-10-CM | POA: Diagnosis not present

## 2015-02-21 NOTE — Patient Instructions (Signed)
Your physician recommends that you continue on your current medications as directed. Please refer to the Current Medication list given to you today.  Your physician wants you to follow-up in: 6 months with Dr Turner You will receive a reminder letter in the mail two months in advance. If you don't receive a letter, please call our office to schedule the follow-up appointment.  

## 2015-02-21 NOTE — Progress Notes (Signed)
Cardiology Office Note   Date:  02/21/2015   ID:  Perel, Hauschild [birth year], MRN [id number]  PCP:  Mauricio Po, FNP  Cardiologist:   Sueanne Margarita, MD   Chief Complaint  Patient presents with  . Tachycardia  . Chest Pain  . Hypertension      History of Present Illness: Meredith Hayden is a 65 y.o. female who presents for post hospital visit from recent hospitalization in January.  She has a history of HTN, DM2 and dyslipidemia and was admitted with chest pain and SVT.  Chest CT was negative for PE and nuclear stress test showed no ischemia and normal LVF on echo.   She was started on a beta blocker with recommendations for outpt event monitor.  Since her discharge she had only had one episode where she felt her heart beating hard but not fast.  She denies any chest pain, SOB, DOE, LE edema, dizziness or syncope.    Past Medical History  Diagnosis Date  . Allergy   . Anxiety   . Asthma   . Depression   . Diabetes mellitus   . Hyperlipidemia   . Hypertension   . IBS (irritable bowel syndrome)   . Thyroid disease   . Arthritis   . OSA (obstructive sleep apnea)   . Dyspareunia     vaginal dryness  . Blood transfusion without reported diagnosis 2002    with knee replacement  . Fibroid     reason for hysterectomy  . STD (sexually transmitted disease)     Hx HPV  . Osteopenia 2009  . Diabetes mellitus type 2 with retinopathy   . Diverticulitis   . GERD (gastroesophageal reflux disease)     Past Surgical History  Procedure Laterality Date  . Partial colectomy      -RSO  . Total knee arthroplasty Bilateral 2002, 2004  . Oophorectomy    . Abdominal hysterectomy  1995    TAH/LSO--Dr. Ubaldo Glassing  . Rotator cuff repair Left   . Cesarean section  1978  . Foot surgery Bilateral   . Nasal sinus surgery       Current Outpatient Prescriptions  Medication Sig Dispense Refill  . ALPRAZolam (XANAX) 1 MG tablet Take 0.5-2 mg by mouth every 4 (four)  hours as needed for anxiety or sleep. Take 1-2 tabs up to 6 times daily    . Ascorbic Acid (VITAMIN C) 1000 MG tablet Take 1,000 mg by mouth daily.    Marland Kitchen aspirin 81 MG tablet Take 81 mg by mouth daily.     Marland Kitchen atorvastatin (LIPITOR) 10 MG tablet TAKE 1 TABLET DAILY OR AS DIRECTED. (Patient taking differently: TAKE 1 TABLET BY MOUTH DAILY OR AS DIRECTED.) 90 tablet 1  . Blood Glucose Monitoring Suppl (ONETOUCH VERIO) W/DEVICE KIT 1 each by Does not apply route daily. 1 kit 0  . Calcium Carbonate-Vit D-Min (CALCIUM 1200 PO) Take 1 tablet by mouth daily.    . cetirizine (ZYRTEC) 10 MG tablet Take 10 mg by mouth daily.     . Cholecalciferol (VITAMIN D3) 1000 UNITS CAPS Take 1 capsule by mouth daily.      Marland Kitchen co-enzyme Q-10 30 MG capsule Take 30 mg by mouth daily.      . cyanocobalamin 100 MCG tablet Take 100 mcg by mouth daily.      Marland Kitchen desvenlafaxine (PRISTIQ) 100 MG 24 hr tablet Take 100 mg by mouth daily.      . Garlic 704 MG TABS  Take 1 tablet by mouth daily.      Marland Kitchen GLIPIZIDE XL 10 MG 24 hr tablet TAKE 1 TABLET DAILY WITH BREAKFAST. (Patient taking differently: TAKE 1 TABLET BY MOUTH DAILY WITH BREAKFAST.) 30 tablet 2  . glucose blood (ONETOUCH VERIO) test strip 1 each by Other route 2 (two) times daily. Use as instructed 100 each 11  . Insulin Glargine (LANTUS SOLOSTAR) 100 UNIT/ML Solostar Pen Inject 30-35 units into the skin at bedtime. 15 mL 2  . Insulin Pen Needle (EASY COMFORT PEN NEEDLES) 31G X 5 MM MISC Use 1x a day 100 each 3  . ipratropium (ATROVENT HFA) 17 MCG/ACT inhaler Inhale 2 puffs into the lungs every 4 (four) hours as needed for wheezing. 1 Inhaler 12  . lamoTRIgine (LAMICTAL) 200 MG tablet Take 400 mg by mouth daily.    Marland Kitchen levothyroxine (SYNTHROID, LEVOTHROID) 50 MCG tablet Take 1 tablet (50 mcg total) by mouth daily before breakfast. 30 tablet 0  . lisinopril-hydrochlorothiazide (PRINZIDE,ZESTORETIC) 20-12.5 MG per tablet TAKE 1 TABLET ONCE DAILY. (Patient taking differently: TAKE 1  TABLET BY MOUTH ONCE DAILY.) 90 tablet 1  . metoprolol tartrate (LOPRESSOR) 25 MG tablet Take 25 mg by mouth 2 (two) times daily.    . Omega-3 350 MG CAPS Take 350 mg by mouth daily.      Marland Kitchen omeprazole (PRILOSEC) 20 MG capsule Take 20 mg by mouth daily.    [name] DELICA LANCETS 56L MISC 1 each by Does not apply route 2 (two) times daily. 100 each 3  . [DISCONTINUED] azelastine (ASTELIN) 137 MCG/SPRAY nasal spray 1 spray by Nasal route 2 (two) times daily. Use in each nostril as directed     . [DISCONTINUED] budesonide-formoterol (SYMBICORT) 160-4.5 MCG/ACT inhaler Inhale 2 puffs into the lungs 2 (two) times daily.      . [DISCONTINUED] diphenhydrAMINE (SOMINEX) 25 MG tablet Take 25 mg by mouth at bedtime as needed.      . [DISCONTINUED] fluticasone (FLONASE) 50 MCG/ACT nasal spray 2 sprays by Nasal route daily.      . [DISCONTINUED] temazepam (RESTORIL) 30 MG capsule Take 30 mg by mouth at bedtime as needed.     No current facility-administered medications for this visit.    Allergies:   Oxycodone-acetaminophen; Codeine phosphate; Erythromycin; Morphine sulfate; Oxycodone hcl; and Sulfamethoxazole    Social History:  The patient  reports that she has never smoked. She has never used smokeless tobacco. She reports that she drinks alcohol. She reports that she does not use illicit drugs.   Family History:  The patient's family history includes COPD in her mother; Cancer in an other family member; Diabetes in her sister; Heart attack (age of onset: 66) in her sister; Heart attack (age of onset: 82) in her father; Hyperlipidemia in an other family member; Hypertension in her father, sister, sister, and another family member; Ovarian cancer in her maternal aunt.    ROS:  Please see the history of present illness.   Otherwise, review of systems are positive for none.   All other systems are reviewed and negative.    PHYSICAL EXAM: VS:  BP 124/76 mmHg  Pulse 59  Ht 5' 1.5" (1.562 m)  Wt 233  lb (105.688 kg)  BMI 43.32 kg/m2  SpO2 96%  LMP 12/31/1993 , BMI Body mass index is 43.32 kg/(m^2). GEN: Well nourished, well developed, in no acute distress HEENT: normal Neck: no JVD, carotid bruits, or masses Cardiac: RRR; no murmurs, rubs, or gallops,no edema  Respiratory:  clear to auscultation bilaterally, normal work of breathing GI: soft, nontender, nondistended, + BS MS: no deformity or atrophy Skin: warm and dry, no rash Neuro:  Strength and sensation are intact Psych: euthymic mood, full affect   EKG:  EKG is ordered today. The ekg ordered today demonstrates sinus bradycardia at 58bpm with incomplete RBBB and ST abnormality due to repolarization in V1-V3   Recent Labs: 01/25/2015: TSH 1.655 01/27/2015: ALT 30; BUN 13; Creatinine 0.90; Hemoglobin 13.0; Magnesium 1.6; Platelets 254; Potassium 4.4; Sodium 137    Lipid Panel    Component Value Date/Time   CHOL 157 01/26/2015 0528   TRIG 178* 01/26/2015 0528   HDL 35* 01/26/2015 0528   CHOLHDL 4.5 01/26/2015 0528   VLDL 36 01/26/2015 0528   LDLCALC 86 01/26/2015 0528   LDLDIRECT 77.1 11/02/2013 0815      Wt Readings from Last 3 Encounters:  02/21/15 233 lb (105.688 kg)  01/31/15 229 lb 1.9 oz (103.928 kg)  01/27/15 233 lb 7.5 oz (105.9 kg)       ASSESSMENT AND PLAN:  1.  SVT with no reoccurrence on BB 2.  Atypical CP related to #1 with no ischemia on nuclear stress test and normal LVF 3.  HTN - controlled.  Continue BB/ACE I/HCTZ 4.  DM2- per PCP 5.  Dyslipidemia 6.  Incomplete RBBB   Current medicines are reviewed at length with the patient today.  The patient does not have concerns regarding medicines.  The following changes have been made:  no change  Labs/ tests ordered today include: event monitor     Disposition:   FU with me in 6 months   Signed, Sueanne Margarita, MD  02/21/2015 11:27 AM    [facility] [address], [place], [place]  [address] Phone: [phone];  Fax: [phone]

## 2015-02-22 ENCOUNTER — Telehealth: Payer: Self-pay

## 2015-02-22 NOTE — Telephone Encounter (Signed)
Left message to call Kaitlyn at [phone]. 

## 2015-02-22 NOTE — Telephone Encounter (Signed)
Pt left voicemail returning call  [phone]

## 2015-02-22 NOTE — Telephone Encounter (Signed)
-----   Message from [place], MD sent at 02/21/2015 10:34 PM EST ----- Regarding: dermatology referral, mammogram, annual exam? I have just received reminder notes in EPIC regarding work que issues for this patient.   Please contact the patient in need for follow up of her dermatologic problem of the vulva.  Did she see Dr. Amy Martinique?  Also, I do not see a mammogram or annual exam for the patient.   Of note, I see that she was hospitalized in January for cardiac issues, and I hope she is doing well.   Josefa Half  ----- Message -----    From: SYSTEM    Sent: 02/21/2015  12:04 AM      To: Brook E Amundson de Berton Lan, MD

## 2015-02-22 NOTE — Telephone Encounter (Signed)
Spoke with patient. Patient states that she was able to see Dr.Jordan. Patient would like to schedule aex with Dr.Silva at this time. Appointment scheduled for 03/07/15 at 3:30pm. Patient is agreeable to date and time. Offered to call and schedule patient's mammogram but patient declines states that she will call to schedule.  Routing to provider for final review. Patient agreeable to disposition. Will close encounter

## 2015-02-23 ENCOUNTER — Telehealth: Payer: Self-pay | Admitting: Cardiology

## 2015-02-23 NOTE — Telephone Encounter (Signed)
To Dr. Turner.  

## 2015-02-23 NOTE — Telephone Encounter (Signed)
Just FYI!   I called patient numerous times to get monitor scheduled. When I spoke to her on 02/15/15 she ask me to call later that she had been having bad aniexty attacks and didn't want to schedule at that time. I called patient again today and she told me she didn't want to have the monitor at all.

## 2015-02-24 ENCOUNTER — Ambulatory Visit (INDEPENDENT_AMBULATORY_CARE_PROVIDER_SITE_OTHER): Payer: Medicare Other | Admitting: Internal Medicine

## 2015-02-24 ENCOUNTER — Encounter: Payer: Self-pay | Admitting: Internal Medicine

## 2015-02-24 ENCOUNTER — Encounter: Payer: Self-pay | Admitting: Family

## 2015-02-24 VITALS — BP 102/60 | HR 62 | Temp 98.0°F | Resp 14 | Wt 228.0 lb

## 2015-02-24 DIAGNOSIS — E11319 Type 2 diabetes mellitus with unspecified diabetic retinopathy without macular edema: Secondary | ICD-10-CM

## 2015-02-24 NOTE — Progress Notes (Signed)
Patient ID: Meredith Hayden, female   DOB: 10-14-1950, 65 y.o.   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 Last visit 2 mo ago.   She has severe depression >> at last visit DM was much worse. She is seeing Dr Toy Care >> referred to a psychiatric neurologist [facility]) for ECT, which was planned 11/2014, but her EKG was abnormal >> PSVT (prolonged QT), she was admitted for PSVT on 01/25/2015. Started on Lopressor.   Her depression is better lately, so that she can take care of her diet and can exercise.  She was also recently found to have fatty liver.   DM2: Last hemoglobin A1c was: Lab Results  Component Value Date   HGBA1C 9.8* 01/26/2015   HGBA1C 10.0* 12/09/2014   HGBA1C 9.8* 06/14/2014   Pt is on a regimen of: - Lantus 20 >> 30 units hs - sometimes forgets the insulin (pens)  - Glipizide XL 10 mg in am - added 07/23/2014 She was on Metformin >> N/V/D/AP She tried Glyburide >> no SEs She tried Januvia.  Also takes: - Turmeric curcumin 500 mg 2x a day - Cinnamon + chromium (2000+400) 2x a day  Pt checks her sugars 0-2x a day and they are much better in the last week >> improved diet and started to walk. - before b'fast: 129-168 >> 158-213, 286 >> 101-169 - 2h after b'fast: when we started tx: 195-225 >> 202 (after gummies) - lunch: 125 >> 250-265 >> 128 - 2h after lunch: 170 >> n/c - dinner: 161 >> 203-231 >> 99 - 2h after dinner: 194 >> n/c >> n/c - bedtime: n/c >> n/c - nighttime: 83 (after this, she decreased the Lantus to 25 units). No lows. Lowest sugar was 189 >> 125 >> 158 >> 83; she has hypoglycemia awareness at 80.  Highest sugar was 259 >> 194 >> 286 >> 202 x 1 lately.  Pt's meals are - no regular pattern - she wakes up at different times of the day: - Breakfast: yoghurt + kind bar +  almonds - Lunch: apple + almond butter - Dinner: biggest meal of the day: meat + veggies ; sometimes just beans + kale + mushrooms - Snacks: anything; several times a day: almonds, sandwich, crackers  - She has CKD, last BUN/creatinine:  Lab Results  Component Value Date   BUN 13 01/27/2015   CREATININE 0.90 01/27/2015  On Lisinopril. 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  - no numbness and tingling in her feet.  Hypothyroidism: - dx several years ago - on LT4 50 mcg daily as she wakes up - takes calcium and Protonix later  - last TSH: Lab Results  Component Value Date   TSH 1.655 01/25/2015   TSH 1.610 12/09/2014   TSH 3.11 11/02/2013   TSH 4.80 01/12/2011   TSH 4.32 09/12/2007   FREET4 0.90 12/09/2014   I reviewed pt's medications, allergies, PMH, social hx, family hx, and changes were documented in the history of present illness. Otherwise, unchanged from my initial visit note.  ROS: Constitutional: no weight gain/loss,  no fatigue, no subjective hyperthermia/hypothermia Eyes: no blurry vision, no xerophthalmia ENT: no sore throat, no nodules palpated in throat, no dysphagia/odynophagia, no hoarseness Cardiovascular: no CP/SOB/+ palpitations/no leg swelling Respiratory: no cough/SOB Gastrointestinal: no N/V/D/C Musculoskeletal: no muscle/joint aches Skin: no rashes Neurological: no tremors/numbness/tingling/dizziness Psychiatric: + depression/+ anxiety  PE: BP 102/60 mmHg  Pulse 62  Temp(Src) 98 F (36.7 C) (Oral)  Resp 14  Wt 228 lb (103.42 kg)  SpO2 98%  LMP 12/31/1993 Body mass index is 42.39 kg/(m^2).  Wt Readings from Last 3 Encounters:  02/24/15 228 lb (103.42 kg)  02/21/15 233 lb (105.688 kg)  01/31/15 229 lb 1.9 oz  (103.928 kg)   Constitutional: overweight, in NAD Eyes: PERRLA, EOMI, no exophthalmos ENT: moist mucous membranes, no thyromegaly, no cervical lymphadenopathy Cardiovascular: RRR, No MRG Respiratory: CTA B Gastrointestinal: abdomen soft, NT, ND, BS+ Musculoskeletal: no deformities, strength intact in all 4 Skin: moist, warm, no rashes Neurological: no tremor with outstretched hands, DTR normal in all 4  ASSESSMENT: 1. 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 Hypothyroidism  Pt's care team: - cardiology: Dr Johnsie Cancel - psychiatry: Dr Toy Care - orthopedics: Dr Percell Miller - GI: Dr Sharlett Iles - Gyn: Dr Quincy Simmonds - Dermatology: Dr Martinique - Podiatry: Dr Blenda Mounts  PLAN:  1. Patient with long-standing, uncontrolled diabetes, on basal insulin + oral med (glipizide), with her control affected by her severe depression. Her sugars improved lately to the point of lows >> she decreased Lantus by 10 units 3 days ago. Will continue this dose. I advised her to let me know if she has more lows so we can decrease her Glipizide XL. - I advised her to: : Patient Instructions  Please continue: - Lantus 25 units at bedtime - Glipizide XL 10 mg in am   Please return in 2 months with your sugar log.   - continue checking sugars at different times of the day - check 2 times a day, rotating checks - up to date with eye exams - Return to clinic in 2 mo with sugar log, will check HbA1c then   2. Hypothyroidism - reviewed latest TFTs with pt >> all normal - she knows to take the thyroid hormone every day, with water, >30 minutes before breakfast, separated by >4 hours from acid reflux medications, calcium, iron, multivitamins.

## 2015-02-24 NOTE — Telephone Encounter (Signed)
Records from visit with Dr. Martinique on 01/24/14 are scanned into EPIC.

## 2015-02-24 NOTE — Patient Instructions (Signed)
Please continue: - Lantus 25 units at bedtime - Glipizide XL 10 mg in am   Please return in 2 months with your sugar log.

## 2015-02-28 ENCOUNTER — Other Ambulatory Visit: Payer: Self-pay | Admitting: *Deleted

## 2015-02-28 MED ORDER — GLIPIZIDE ER 10 MG PO TB24: ORAL_TABLET | ORAL | Status: DC

## 2015-03-01 ENCOUNTER — Encounter: Payer: Self-pay | Admitting: Family

## 2015-03-01 ENCOUNTER — Ambulatory Visit (INDEPENDENT_AMBULATORY_CARE_PROVIDER_SITE_OTHER): Payer: Medicare Other | Admitting: Family

## 2015-03-01 VITALS — BP 128/74 | HR 66 | Temp 98.6°F | Resp 18 | Ht 61.5 in | Wt 230.0 lb

## 2015-03-01 DIAGNOSIS — K760 Fatty (change of) liver, not elsewhere classified: Secondary | ICD-10-CM | POA: Insufficient documentation

## 2015-03-01 DIAGNOSIS — Z23 Encounter for immunization: Secondary | ICD-10-CM | POA: Diagnosis not present

## 2015-03-01 NOTE — Progress Notes (Signed)
Pre visit review using our clinic review tool, if applicable. No additional management support is needed unless otherwise documented below in the visit note. 

## 2015-03-01 NOTE — Patient Instructions (Addendum)
Continue to take Vitamin E  Increase physical activity as tolerated to goal of 30 minutes most if not all days of the week  Continue nutrient density - fruits, vegetables, lean meats, decreased saturated fats and minimize trans fats and the processed food.  Try to avoid alcohol consumption.    Fatty Liver Fatty liver is the accumulation of fat in liver cells. It is also called hepatosteatosis or steatohepatitis. It is normal for your liver to contain some fat. If fat is more than 5 to 10% of your liver's weight, you have fatty liver.  There are often no symptoms (problems) for years while damage is still occurring. People often learn about their fatty liver when they have medical tests for other reasons. Fat can damage your liver for years or even decades without causing problems. When it becomes severe, it can cause fatigue, weight loss, weakness, and confusion. This makes you more likely to develop more serious liver problems. The liver is the largest organ in the body. It does a lot of work and often gives no warning signs when it is sick until late in a disease. The liver has many important jobs including:  Breaking down foods.  Storing vitamins, iron, and other minerals.  Making proteins.  Making bile for food digestion.  Breaking down many products including medications, alcohol and some poisons. CAUSES  There are a number of different conditions, medications, and poisons that can cause a fatty liver. Eating too many calories causes fat to build up in the liver. Not processing and breaking fats down normally may also cause this. Certain conditions, such as obesity, diabetes, and high triglycerides also cause this. Most fatty liver patients tend to be middle-aged and over weight.  Some causes of fatty liver are:  Alcohol over consumption.  Malnutrition.  Steroid use.  Valproic acid toxicity.  Obesity.  Cushing's syndrome.  Poisons.  Tetracycline in high  dosages.  Pregnancy.  Diabetes.  Hyperlipidemia.  Rapid weight loss. Some people develop fatty liver even having none of these conditions. SYMPTOMS  Fatty liver most often causes no problems. This is called asymptomatic.  It can be diagnosed with blood tests and also by a liver biopsy.  It is one of the most common causes of minor elevations of liver enzymes on routine blood tests.  Specialized Imaging of the liver using ultrasound, CT (computed tomography) scan, or MRI (magnetic resonance imaging) can suggest a fatty liver but a biopsy is needed to confirm it.  A biopsy involves taking a small sample of liver tissue. This is done by using a needle. It is then looked at under a microscope by a specialist. TREATMENT  It is important to treat the cause. Simple fatty liver without a medical reason may not need treatment.  Weight loss, fat restriction, and exercise in overweight patients produces inconsistent results but is worth trying.  Fatty liver due to alcohol toxicity may not improve even with stopping drinking.  Good control of diabetes may reduce fatty liver.  Lower your triglycerides through diet, medication or both.  Eat a balanced, healthy diet.  Increase your physical activity.  Get regular checkups from a liver specialist.  There are no medical or surgical treatments for a fatty liver or NASH, but improving your diet and increasing your exercise may help prevent or reverse some of the damage. PROGNOSIS  Fatty liver may cause no damage or it can lead to an inflammation of the liver. This is, called steatohepatitis. When it is linked  to alcohol abuse, it is called alcoholic steatohepatitis. It often is not linked to alcohol. It is then called nonalcoholic steatohepatitis, or NASH. Over time the liver may become scarred and hardened. This condition is called cirrhosis. Cirrhosis is serious and may lead to liver failure or cancer. NASH is one of the leading causes of  cirrhosis. About 10-20% of Americans have fatty liver and a smaller 2-5% has NASH. Document Released: 02/01/2006 Document Revised: 03/10/2012 Document Reviewed: 04/28/2014 [facility] Patient Information 2015 [place], [place]. This information is not intended to replace advice given to you by your health care provider. Make sure you discuss any questions you have with your health care provider.

## 2015-03-01 NOTE — Progress Notes (Signed)
Subjective:    Patient ID: Meredith Hayden, female    DOB: [birth year], 65 y.o.   MRN: [id number]  Chief Complaint  Patient presents with  . Follow-up    wants to review xrays from the hospital a month ago    HPI:  Meredith Hayden is a 65 y.o. female who presents today to follow up on a CT scan at the hospital.  Indicates that since her diagnosis, she has changed her nutrition. States that she has noted some mild weight loss in the last couple of weeks and that her blood sugars are better under control. Exercise is minimal at this time.    Allergies  Allergen Reactions  . Oxycodone-Acetaminophen Anaphylaxis    Patient states that she can regular tylenol without any problems  . Codeine Phosphate     REACTION: unspecified  . Erythromycin Other (See Comments)    Dizzy and sweaty   . Morphine Sulfate Itching    Tolerated hydromorphone   . Oxycodone Hcl     REACTION: unspecified  . Sulfamethoxazole     REACTION: rash    Current Outpatient Prescriptions on File Prior to Visit  Medication Sig Dispense Refill  . ALPRAZolam (XANAX) 1 MG tablet Take 0.5-2 mg by mouth every 4 (four) hours as needed for anxiety or sleep. Take 1-2 tabs up to 6 times daily    . Ascorbic Acid (VITAMIN C) 1000 MG tablet Take 1,000 mg by mouth daily.    Marland Kitchen aspirin 81 MG tablet Take 81 mg by mouth daily.     Marland Kitchen atorvastatin (LIPITOR) 10 MG tablet TAKE 1 TABLET DAILY OR AS DIRECTED. (Patient taking differently: TAKE 1 TABLET BY MOUTH DAILY OR AS DIRECTED.) 90 tablet 1  . Blood Glucose Monitoring Suppl (ONETOUCH VERIO) W/DEVICE KIT 1 each by Does not apply route daily. 1 kit 0  . Calcium Carbonate-Vit D-Min (CALCIUM 1200 PO) Take 1 tablet by mouth daily.    . cetirizine (ZYRTEC) 10 MG tablet Take 10 mg by mouth daily.     . Cholecalciferol (VITAMIN D3) 1000 UNITS CAPS Take 1 capsule by mouth daily.      Marland Kitchen co-enzyme Q-10 30 MG capsule Take 30 mg by mouth daily.      . cyanocobalamin 100 MCG tablet  Take 100 mcg by mouth daily.      Marland Kitchen desvenlafaxine (PRISTIQ) 100 MG 24 hr tablet Take 100 mg by mouth daily.      . Garlic 993 MG TABS Take 1 tablet by mouth daily.      Marland Kitchen glipiZIDE (GLIPIZIDE XL) 10 MG 24 hr tablet TAKE 1 TABLET DAILY WITH BREAKFAST. 90 tablet 0  . glucose blood (ONETOUCH VERIO) test strip 1 each by Other route 2 (two) times daily. Use as instructed 100 each 11  . Insulin Glargine (LANTUS SOLOSTAR) 100 UNIT/ML Solostar Pen Inject 30-35 units into the skin at bedtime. 15 mL 2  . Insulin Pen Needle (EASY COMFORT PEN NEEDLES) 31G X 5 MM MISC Use 1x a day 100 each 3  . ipratropium (ATROVENT HFA) 17 MCG/ACT inhaler Inhale 2 puffs into the lungs every 4 (four) hours as needed for wheezing. 1 Inhaler 12  . lamoTRIgine (LAMICTAL) 200 MG tablet Take 400 mg by mouth daily.    Marland Kitchen levothyroxine (SYNTHROID, LEVOTHROID) 50 MCG tablet TAKE 1 TABLET ONCE DAILY. 30 tablet 5  . lisinopril-hydrochlorothiazide (PRINZIDE,ZESTORETIC) 20-12.5 MG per tablet TAKE 1 TABLET ONCE DAILY. (Patient taking differently: TAKE 1 TABLET BY MOUTH ONCE  DAILY.) 90 tablet 1  . metoprolol tartrate (LOPRESSOR) 25 MG tablet Take 25 mg by mouth 2 (two) times daily.    . Omega-3 350 MG CAPS Take 350 mg by mouth daily.      Marland Kitchen omeprazole (PRILOSEC) 20 MG capsule Take 20 mg by mouth daily.    [name] DELICA LANCETS 59Y MISC 1 each by Does not apply route 2 (two) times daily. 100 each 3  . [DISCONTINUED] azelastine (ASTELIN) 137 MCG/SPRAY nasal spray 1 spray by Nasal route 2 (two) times daily. Use in each nostril as directed     . [DISCONTINUED] budesonide-formoterol (SYMBICORT) 160-4.5 MCG/ACT inhaler Inhale 2 puffs into the lungs 2 (two) times daily.      . [DISCONTINUED] diphenhydrAMINE (SOMINEX) 25 MG tablet Take 25 mg by mouth at bedtime as needed.      . [DISCONTINUED] fluticasone (FLONASE) 50 MCG/ACT nasal spray 2 sprays by Nasal route daily.      . [DISCONTINUED] temazepam (RESTORIL) 30 MG capsule Take 30 mg by mouth  at bedtime as needed.     No current facility-administered medications on file prior to visit.    Review of Systems  Constitutional: Negative for fever and chills.  Gastrointestinal: Negative for nausea, abdominal pain, diarrhea and abdominal distention.      Objective:    BP 128/74 mmHg  Pulse 66  Temp(Src) 98.6 F (37 C) (Oral)  Resp 18  Ht 5' 1.5" (1.562 m)  Wt 230 lb (104.327 kg)  BMI 42.76 kg/m2  SpO2 95%  LMP 12/31/1993 Nursing note and vital signs reviewed.  Physical Exam  Constitutional: She is oriented to person, place, and time. She appears well-developed and well-nourished. No distress.  Cardiovascular: Normal rate, regular rhythm, normal heart sounds and intact distal pulses.   Pulmonary/Chest: Effort normal and breath sounds normal.  Neurological: She is alert and oriented to person, place, and time.  Skin: Skin is warm and dry.  Psychiatric: She has a normal mood and affect. Her behavior is normal. Judgment and thought content normal.       Assessment & Plan:

## 2015-03-01 NOTE — Assessment & Plan Note (Signed)
Discussed diagnosis of fatty liver disease and goal to decrease weight and improve nutritional intake. Hep A vaccination given today. Continue Vitamin E as previously taking. Reviewed labs with patient and answered questions. Continue previously prescribed medications as current dosages. Follow up in 3 months or sooner if needed.

## 2015-03-03 ENCOUNTER — Encounter: Payer: Self-pay | Admitting: Family

## 2015-03-03 ENCOUNTER — Other Ambulatory Visit: Payer: Self-pay

## 2015-03-03 MED ORDER — METOPROLOL TARTRATE 25 MG PO TABS: 25.0000 mg | ORAL_TABLET | Freq: Two times a day (BID) | ORAL | Status: DC

## 2015-03-07 ENCOUNTER — Other Ambulatory Visit: Payer: Self-pay | Admitting: Obstetrics and Gynecology

## 2015-03-07 ENCOUNTER — Ambulatory Visit (INDEPENDENT_AMBULATORY_CARE_PROVIDER_SITE_OTHER): Payer: Medicare Other | Admitting: Obstetrics and Gynecology

## 2015-03-07 ENCOUNTER — Encounter: Payer: Self-pay | Admitting: Obstetrics and Gynecology

## 2015-03-07 ENCOUNTER — Other Ambulatory Visit: Payer: Self-pay | Admitting: Respiratory Therapy

## 2015-03-07 VITALS — BP 134/80 | HR 78 | Ht 60.5 in | Wt 226.4 lb

## 2015-03-07 DIAGNOSIS — N9089 Other specified noninflammatory disorders of vulva and perineum: Secondary | ICD-10-CM

## 2015-03-07 DIAGNOSIS — Z01419 Encounter for gynecological examination (general) (routine) without abnormal findings: Secondary | ICD-10-CM

## 2015-03-07 DIAGNOSIS — M8580 Other specified disorders of bone density and structure, unspecified site: Secondary | ICD-10-CM

## 2015-03-07 DIAGNOSIS — Z1231 Encounter for screening mammogram for malignant neoplasm of breast: Secondary | ICD-10-CM

## 2015-03-07 NOTE — Patient Instructions (Signed)

## 2015-03-07 NOTE — Progress Notes (Signed)
Patient is scheduled for bilateral screening mammogram and bone density testing for 03/09/15 at 1515. She is agreeable to time, day and location at [facility]

## 2015-03-07 NOTE — Progress Notes (Signed)
65 y.o. [id number] MarriedCaucasianF here for annual exam.    Wants a recheck of her lichen sclerosus.  Did see Dr. Amy Martinique.  No office visit notes in chart.  Received steroid cream and feels it is spreading.  No biopsies were done by dermatologist at her visit.  (Had a biopsy many, many years ago.) States it is difficult to get care there.  If she misses an appointment, the next available appointment is one year later.  Mammogram overdue.  Last due in 2012.  Does self breast exams.   Has a hard time leaving house.  Has agoraphobia.  Took a Xanax for her visit today.  Bone density in 2009 - osteopenia? Done at Dr. Josie Dixon office.  Feels she has lost 1.5 inches in height since her max height in her 30s.  Patient's last menstrual period was 12/31/1993.          Sexually active: No.  The current method of family planning is status post hysterectomy.    Exercising: No.  The patient does not participate in regular exercise at present. Smoker:  no  Health Maintenance: Pap:  2009 wnl - done by Dr. Ubaldo Glassing History of abnormal Pap:  no MMG:  09/21/2011 Bi-rads 1: Negative Colonoscopy:  2009- polyps f/u 2018 BMD:   2009  TDaP:  09/19/2007 Screening Labs: no, done by Jinny Sanders, NP , Urine today: Declined.   reports that she has never smoked. She has never used smokeless tobacco. She reports that she drinks alcohol. She reports that she does not use illicit drugs.  Past Medical History  Diagnosis Date  . Allergy   . Anxiety   . Asthma   . Depression   . Diabetes mellitus   . Hyperlipidemia   . Hypertension   . IBS (irritable bowel syndrome)   . Thyroid disease   . Arthritis   . OSA (obstructive sleep apnea)   . Dyspareunia     vaginal dryness  . Blood transfusion without reported diagnosis 2002    with knee replacement  . Fibroid     reason for hysterectomy  . STD (sexually transmitted disease)     Hx HPV  . Osteopenia 2009  . Diabetes mellitus type 2 with retinopathy    . Diverticulitis   . GERD (gastroesophageal reflux disease)     Past Surgical History  Procedure Laterality Date  . Partial colectomy      -RSO  . Total knee arthroplasty Bilateral 2002, 2004  . Oophorectomy    . Abdominal hysterectomy  1995    TAH/LSO--Dr. Ubaldo Glassing  . Rotator cuff repair Left   . Cesarean section  1978  . Foot surgery Bilateral   . Nasal sinus surgery      Current Outpatient Prescriptions  Medication Sig Dispense Refill  . ALPRAZolam (XANAX) 1 MG tablet Take 0.5-2 mg by mouth every 4 (four) hours as needed for anxiety or sleep. Take 1-2 tabs up to 6 times daily    . Ascorbic Acid (VITAMIN C) 1000 MG tablet Take 1,000 mg by mouth daily.    Marland Kitchen aspirin 81 MG tablet Take 81 mg by mouth daily.     Marland Kitchen atorvastatin (LIPITOR) 10 MG tablet TAKE 1 TABLET DAILY OR AS DIRECTED. (Patient taking differently: TAKE 1 TABLET BY MOUTH DAILY OR AS DIRECTED.) 90 tablet 1  . Blood Glucose Monitoring Suppl (ONETOUCH VERIO) W/DEVICE KIT 1 each by Does not apply route daily. 1 kit 0  . Calcium Carbonate-Vit D-Min (CALCIUM 1200 PO)  Take 1 tablet by mouth daily.    . cetirizine (ZYRTEC) 10 MG tablet Take 10 mg by mouth daily.     . Cholecalciferol (VITAMIN D3) 1000 UNITS CAPS Take 1 capsule by mouth daily.      Marland Kitchen co-enzyme Q-10 30 MG capsule Take 30 mg by mouth daily.      . cyanocobalamin 100 MCG tablet Take 100 mcg by mouth daily.      Marland Kitchen desvenlafaxine (PRISTIQ) 100 MG 24 hr tablet Take 100 mg by mouth daily.      . Garlic 737 MG TABS Take 1 tablet by mouth daily.      Marland Kitchen glipiZIDE (GLIPIZIDE XL) 10 MG 24 hr tablet TAKE 1 TABLET DAILY WITH BREAKFAST. 90 tablet 0  . glucose blood (ONETOUCH VERIO) test strip 1 each by Other route 2 (two) times daily. Use as instructed 100 each 11  . Insulin Glargine (LANTUS SOLOSTAR) 100 UNIT/ML Solostar Pen Inject 30-35 units into the skin at bedtime. 15 mL 2  . Insulin Pen Needle (EASY COMFORT PEN NEEDLES) 31G X 5 MM MISC Use 1x a day 100 each 3  .  ipratropium (ATROVENT HFA) 17 MCG/ACT inhaler Inhale 2 puffs into the lungs every 4 (four) hours as needed for wheezing. 1 Inhaler 12  . lamoTRIgine (LAMICTAL) 200 MG tablet Take 400 mg by mouth daily.    Marland Kitchen levothyroxine (SYNTHROID, LEVOTHROID) 50 MCG tablet TAKE 1 TABLET ONCE DAILY. 30 tablet 5  . lisinopril-hydrochlorothiazide (PRINZIDE,ZESTORETIC) 20-12.5 MG per tablet TAKE 1 TABLET ONCE DAILY. (Patient taking differently: TAKE 1 TABLET BY MOUTH ONCE DAILY.) 90 tablet 1  . metoprolol tartrate (LOPRESSOR) 25 MG tablet Take 1 tablet (25 mg total) by mouth 2 (two) times daily. 90 tablet 1  . Omega-3 350 MG CAPS Take 350 mg by mouth daily.      Marland Kitchen omeprazole (PRILOSEC) 20 MG capsule Take 20 mg by mouth daily.    [name] DELICA LANCETS 10G MISC 1 each by Does not apply route 2 (two) times daily. 100 each 3  . [DISCONTINUED] azelastine (ASTELIN) 137 MCG/SPRAY nasal spray 1 spray by Nasal route 2 (two) times daily. Use in each nostril as directed     . [DISCONTINUED] budesonide-formoterol (SYMBICORT) 160-4.5 MCG/ACT inhaler Inhale 2 puffs into the lungs 2 (two) times daily.      . [DISCONTINUED] diphenhydrAMINE (SOMINEX) 25 MG tablet Take 25 mg by mouth at bedtime as needed.      . [DISCONTINUED] fluticasone (FLONASE) 50 MCG/ACT nasal spray 2 sprays by Nasal route daily.      . [DISCONTINUED] temazepam (RESTORIL) 30 MG capsule Take 30 mg by mouth at bedtime as needed.     No current facility-administered medications for this visit.    Family History  Problem Relation Age of Onset  . COPD Mother   . Heart attack Father 26  . Hypertension Father   . Heart attack Sister 61  . Diabetes Sister   . Hypertension Sister   . Hyperlipidemia      fhx  . Hypertension      fhx  . Cancer      ovarian/grandmother  . Ovarian cancer Maternal Aunt   . Hypertension Sister     ROS:  Pertinent items are noted in HPI.  Otherwise, a comprehensive ROS was negative.  Exam:   BP 134/80 mmHg  Pulse 78  Ht  5' 0.5" (1.537 m)  Wt 226 lb 6.4 oz (102.694 kg)  BMI 43.47 kg/m2  LMP  12/31/1993     Height: 5' 0.5" (153.7 cm)  Ht Readings from Last 3 Encounters:  03/07/15 5' 0.5" (1.537 m)  03/01/15 5' 1.5" (1.562 m)  02/21/15 5' 1.5" (1.562 m)    General appearance: alert, cooperative and appears stated age Head: Normocephalic, without obvious abnormality, atraumatic Neck: no adenopathy, supple, symmetrical, trachea midline and thyroid normal to inspection and palpation Lungs: clear to auscultation bilaterally Breasts: normal appearance, no masses or tenderness, Inspection negative, No nipple retraction or dimpling, No nipple discharge or bleeding, No axillary or supraclavicular adenopathy Heart: regular rate and rhythm Abdomen: vertical incision, soft, non-tender; bowel sounds normal; no masses,  no organomegaly Extremities: extremities normal, atraumatic, no cyanosis or edema Skin: Skin color, texture, turgor normal. Erythematous plaques under breasts and under pannus. Lymph nodes: Cervical, supraclavicular, and axillary nodes normal. No abnormal inguinal nodes palpated Neurologic: Grossly normal   Pelvic: External genitalia:  Extensive ecchymoses and white change of the bilateral labia minora and supraclitoral region.               Urethra:  normal appearing urethra with no masses, tenderness or lesions              Bartholins and Skenes: normal                 Vagina: normal appearing vagina with normal color and discharge, no lesions              Cervix: absent              Pap taken: No. Bimanual Exam:  Uterus:  uterus absent              Adnexa: no mass, fullness, tenderness               Rectovaginal: Confirms               Anus:  normal sphincter tone, no lesions  Chaperone was present for exam.  A:  Well Woman with normal exam Status post TAH/LSO. Status post RSO and partial colectomy. Osteopenia. 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 Candida of the skin under breasts and  pannus. Agoraphobia.  P:   Mammogram and bone density due.  Will schedule both for the patient.  pap smear not indicated.  Return for vulvar biopsy.   Discussed association of lichen sclerosus and vulvar dysplasia. Can prescribe Clobetasol after biopsy done.  Will also have patient sign records release for dermatology visit with Dr. Martinique.  Nystatin powder.  See orders. return annually or prn

## 2015-03-09 ENCOUNTER — Ambulatory Visit
Admission: RE | Admit: 2015-03-09 | Discharge: 2015-03-09 | Disposition: A | Discharge status: Home / Self Care | Payer: Medicare Other | Source: Ambulatory Visit | Attending: Obstetrics and Gynecology | Admitting: Obstetrics and Gynecology

## 2015-03-09 DIAGNOSIS — Z1231 Encounter for screening mammogram for malignant neoplasm of breast: Secondary | ICD-10-CM | POA: Diagnosis not present

## 2015-03-09 DIAGNOSIS — M8580 Other specified disorders of bone density and structure, unspecified site: Secondary | ICD-10-CM

## 2015-03-09 DIAGNOSIS — M899 Disorder of bone, unspecified: Secondary | ICD-10-CM | POA: Diagnosis not present

## 2015-03-11 ENCOUNTER — Telehealth: Payer: Self-pay | Admitting: Obstetrics and Gynecology

## 2015-03-11 NOTE — Telephone Encounter (Signed)
Call to patient. Advised of benefit quote received for biopsy vulva. Patient agreeable. Scheduled procedure.

## 2015-03-14 ENCOUNTER — Telehealth: Payer: Self-pay

## 2015-03-14 NOTE — Telephone Encounter (Signed)
Called and verified with pharmacy.

## 2015-03-14 NOTE — Telephone Encounter (Signed)
Yes, we increased her from taking 12.5 mg to 25 mg BID which is 1 tablet twice daily. Thank you for clarifying.

## 2015-03-14 NOTE — Telephone Encounter (Signed)
Pts pharmacy faxed a paper that stated her metoprolol tartrate 25 mg has be changed to taking 1 tablet BID. It looks like its already on her list that way but pharmacy wants me to call and verify verbally the rx. Is that ok with you? Please advise

## 2015-03-16 ENCOUNTER — Ambulatory Visit (INDEPENDENT_AMBULATORY_CARE_PROVIDER_SITE_OTHER): Payer: Medicare Other | Admitting: Obstetrics and Gynecology

## 2015-03-16 ENCOUNTER — Encounter: Payer: Self-pay | Admitting: Obstetrics and Gynecology

## 2015-03-16 ENCOUNTER — Other Ambulatory Visit: Payer: Self-pay | Admitting: Obstetrics and Gynecology

## 2015-03-16 VITALS — BP 110/70 | HR 66 | Ht 60.5 in | Wt 226.6 lb

## 2015-03-16 DIAGNOSIS — M8580 Other specified disorders of bone density and structure, unspecified site: Secondary | ICD-10-CM | POA: Diagnosis not present

## 2015-03-16 DIAGNOSIS — N9089 Other specified noninflammatory disorders of vulva and perineum: Secondary | ICD-10-CM | POA: Diagnosis not present

## 2015-03-16 DIAGNOSIS — L900 Lichen sclerosus et atrophicus: Secondary | ICD-10-CM | POA: Diagnosis not present

## 2015-03-16 NOTE — Progress Notes (Signed)
Patient ID: Meredith Hayden, female   DOB: 05-27-1950, 65 y.o.   MRN: [id number] GYNECOLOGY  VISIT   HPI: 65 y.o.   Married  Caucasian  female   G2P2002 with Patient's last menstrual period was 12/31/1993.   here for vulvar biopsy.   Asking about mammogram and bone density report, each done on 03/09/15. Mammogram normal. Bone density showed spine with T score of -1.5 and left hip T score of - 0.5.  Impression is osteopenia.   GYNECOLOGIC HISTORY: Patient's last menstrual period was 12/31/1993. Contraception:  Hysterectomy  Menopausal hormone therapy: none        OB History    Gravida Para Term Preterm AB TAB SAB Ectopic Multiple Living   '2 2 2       2         ' Patient Active Problem List   Diagnosis Date Noted  . Fatty liver disease, nonalcoholic 93/57/0177  . Atypical chest pain 02/21/2015  . PSVT (paroxysmal supraventricular tachycardia)   . Diabetes mellitus type 2 with retinopathy   . IRRITABLE BOWEL SYNDROME 03/05/2008  . DISORDER, BIPOLAR NEC 09/19/2007  . SLEEP APNEA 09/19/2007  . Hypothyroidism 08/26/2007  . OSTEOARTHRITIS 08/26/2007  . Hyperlipidemia 10/31/2006  . OBESITY, MORBID 10/31/2006  . ANXIETY 10/31/2006  . Depression 10/31/2006  . Essential hypertension 10/31/2006  . ALLERGIC RHINITIS 10/31/2006  . ASTHMA 10/31/2006  . DIVERTICULOSIS, COLON 10/31/2006  . COLONIC POLYPS 11/29/2005    Past Medical History  Diagnosis Date  . Allergy   . Anxiety   . Asthma   . Depression   . Diabetes mellitus   . Hyperlipidemia   . Hypertension   . IBS (irritable bowel syndrome)   . Thyroid disease   . Arthritis   . OSA (obstructive sleep apnea)   . Dyspareunia     vaginal dryness  . Blood transfusion without reported diagnosis 2002    with knee replacement  . Fibroid     reason for hysterectomy  . STD (sexually transmitted disease)     Hx HPV  . Osteopenia 2009  . Diabetes mellitus type 2 with retinopathy   . Diverticulitis   . GERD  (gastroesophageal reflux disease)     Past Surgical History  Procedure Laterality Date  . Partial colectomy      -RSO  . Total knee arthroplasty Bilateral 2002, 2004  . Oophorectomy    . Abdominal hysterectomy  1995    TAH/LSO--Dr. Ubaldo Glassing  . Rotator cuff repair Left   . Cesarean section  1978  . Foot surgery Bilateral   . Nasal sinus surgery      Current Outpatient Prescriptions  Medication Sig Dispense Refill  . ALPRAZolam (XANAX) 1 MG tablet Take 0.5-2 mg by mouth every 4 (four) hours as needed for anxiety or sleep. Take 1-2 tabs up to 6 times daily    . Ascorbic Acid (VITAMIN C) 1000 MG tablet Take 1,000 mg by mouth daily.    Marland Kitchen aspirin 81 MG tablet Take 81 mg by mouth daily.     Marland Kitchen atorvastatin (LIPITOR) 10 MG tablet TAKE 1 TABLET DAILY OR AS DIRECTED. (Patient taking differently: TAKE 1 TABLET BY MOUTH DAILY OR AS DIRECTED.) 90 tablet 1  . Blood Glucose Monitoring Suppl (ONETOUCH VERIO) W/DEVICE KIT 1 each by Does not apply route daily. 1 kit 0  . Calcium Carbonate-Vit D-Min (CALCIUM 1200 PO) Take 1 tablet by mouth daily.    . cetirizine (ZYRTEC) 10 MG tablet Take 10 mg by  mouth daily.     . Cholecalciferol (VITAMIN D3) 1000 UNITS CAPS Take 1 capsule by mouth daily.      Marland Kitchen co-enzyme Q-10 30 MG capsule Take 30 mg by mouth daily.      . cyanocobalamin 100 MCG tablet Take 100 mcg by mouth daily.      Marland Kitchen desvenlafaxine (PRISTIQ) 100 MG 24 hr tablet Take 100 mg by mouth daily.      . Garlic 540 MG TABS Take 1 tablet by mouth daily.      Marland Kitchen glipiZIDE (GLIPIZIDE XL) 10 MG 24 hr tablet TAKE 1 TABLET DAILY WITH BREAKFAST. 90 tablet 0  . glucose blood (ONETOUCH VERIO) test strip 1 each by Other route 2 (two) times daily. Use as instructed 100 each 11  . Insulin Glargine (LANTUS SOLOSTAR) 100 UNIT/ML Solostar Pen Inject 30-35 units into the skin at bedtime. 15 mL 2  . Insulin Pen Needle (EASY COMFORT PEN NEEDLES) 31G X 5 MM MISC Use 1x a day 100 each 3  . ipratropium (ATROVENT HFA) 17 MCG/ACT  inhaler Inhale 2 puffs into the lungs every 4 (four) hours as needed for wheezing. 1 Inhaler 12  . lamoTRIgine (LAMICTAL) 200 MG tablet Take 400 mg by mouth daily.    Marland Kitchen levothyroxine (SYNTHROID, LEVOTHROID) 50 MCG tablet TAKE 1 TABLET ONCE DAILY. 30 tablet 5  . lisinopril-hydrochlorothiazide (PRINZIDE,ZESTORETIC) 20-12.5 MG per tablet TAKE 1 TABLET ONCE DAILY. (Patient taking differently: TAKE 1 TABLET BY MOUTH ONCE DAILY.) 90 tablet 1  . metoprolol tartrate (LOPRESSOR) 25 MG tablet Take 1 tablet (25 mg total) by mouth 2 (two) times daily. 90 tablet 1  . Omega-3 350 MG CAPS Take 350 mg by mouth daily.      Marland Kitchen omeprazole (PRILOSEC) 20 MG capsule Take 20 mg by mouth daily.    [name] DELICA LANCETS 08Q MISC 1 each by Does not apply route 2 (two) times daily. 100 each 3  . [DISCONTINUED] azelastine (ASTELIN) 137 MCG/SPRAY nasal spray 1 spray by Nasal route 2 (two) times daily. Use in each nostril as directed     . [DISCONTINUED] budesonide-formoterol (SYMBICORT) 160-4.5 MCG/ACT inhaler Inhale 2 puffs into the lungs 2 (two) times daily.      . [DISCONTINUED] diphenhydrAMINE (SOMINEX) 25 MG tablet Take 25 mg by mouth at bedtime as needed.      . [DISCONTINUED] fluticasone (FLONASE) 50 MCG/ACT nasal spray 2 sprays by Nasal route daily.      . [DISCONTINUED] temazepam (RESTORIL) 30 MG capsule Take 30 mg by mouth at bedtime as needed.     No current facility-administered medications for this visit.     ALLERGIES: Oxycodone-acetaminophen; Codeine phosphate; Erythromycin; Morphine sulfate; Oxycodone hcl; and Sulfamethoxazole  Family History  Problem Relation Age of Onset  . COPD Mother   . Heart attack Father 31  . Hypertension Father   . Heart attack Sister 43  . Diabetes Sister   . Hypertension Sister   . Hyperlipidemia      fhx  . Hypertension      fhx  . Cancer      ovarian/grandmother  . Ovarian cancer Maternal Aunt   . Hypertension Sister     History   Social History  . Marital  Status: Married    Spouse Name: N/A  . Number of Children: 2  . Years of Education: 18   Occupational History  . disabled     orthopedic / psych   Social History Main Topics  . Smoking  status: Never Smoker   . Smokeless tobacco: Never Used  . Alcohol Use: 0.0 oz/week    0 Standard drinks or equivalent per week     Comment: 2-3 drinks a year  . Drug Use: No  . Sexual Activity:    Partners: Male    Birth Control/ Protection: Surgical     Comment: TAH   Other Topics Concern  . Not on file   Social History Narrative   Grew up in [place]. Currently resides in resides in a house with her husband. 4 dogs. Fun: Play with your dogs and be with grandchildren, needle work.    Denies any religious beliefs effecting healthcare.        ROS:  Pertinent items are noted in HPI.  PHYSICAL EXAMINATION:    BP 110/70 mmHg  Pulse 66  Ht 5' 0.5" (1.537 m)  Wt 226 lb 9.6 oz (102.785 kg)  BMI 43.51 kg/m2  LMP 12/31/1993     General appearance: alert, cooperative and appears stated age   Pelvic: External genitalia:  Significant ecchymoses of bilateral labia.  Labia majora and minora appear fused and no clear distinction between them.    Vulvar biopsies.  Consent for procedure.  Sterile prep with Hibiclens.  Local 1% lidocaine to right superior and right inferior labia. 3 mm punch biopsy to each location and sent to pathology separately.  Suture of 3/0 vicryl placed in each location.  Minimal EBL. No complication.               ASSESSMENT  Probable lichen sclerosus. Osteopenia.   PLAN  Follow up biopsies.  Awaiting reports from prior dermatology appointment. Post biopsy instructions given.  I anticipate clobetasol ointment bid as treatment.  Calcium, Vit D, and weight bearing exercise to maintain bone density and strength.  Next bone density in 2 years. Return for recheck and removal of sutures in 10 days.  An After Visit Summary was printed and given to the patient.  __10____  minutes face to face time of which over 50% was spent in counseling.

## 2015-03-23 ENCOUNTER — Telehealth: Payer: Self-pay | Admitting: Emergency Medicine

## 2015-03-23 NOTE — Telephone Encounter (Signed)
-----   Message from Nunzio Cobbs, MD sent at 03/21/2015  6:56 PM EDT ----- Please inform patient that the skin biopsies showed lichen sclerosus.  She has a recheck appointment with me for suture removal.  I will prescribe then the Clobetasol ointment.  This is the diagnosis and plan we were expecting.  Cc- Marisa Sprinkles

## 2015-03-23 NOTE — Telephone Encounter (Signed)
Patient given message from Dr. Quincy Simmonds. She is verbalizes understanding and is agreeable to plan of care from Dr. Quincy Simmonds. Routing to provider for final review. Patient agreeable to disposition. Will close encounter

## 2015-03-30 ENCOUNTER — Ambulatory Visit (INDEPENDENT_AMBULATORY_CARE_PROVIDER_SITE_OTHER): Payer: Medicare Other | Admitting: Obstetrics and Gynecology

## 2015-03-30 ENCOUNTER — Encounter: Payer: Self-pay | Admitting: Obstetrics and Gynecology

## 2015-03-30 VITALS — BP 110/70 | HR 70 | Ht 60.5 in | Wt 226.6 lb

## 2015-03-30 DIAGNOSIS — N904 Leukoplakia of vulva: Secondary | ICD-10-CM

## 2015-03-30 DIAGNOSIS — M533 Sacrococcygeal disorders, not elsewhere classified: Secondary | ICD-10-CM | POA: Diagnosis not present

## 2015-03-30 MED ORDER — CLOBETASOL PROPIONATE 0.05 % EX OINT: 1.0000 "application " | TOPICAL_OINTMENT | Freq: Two times a day (BID) | CUTANEOUS | Status: DC

## 2015-03-30 NOTE — Progress Notes (Signed)
Patient ID: Meredith Hayden, female   DOB: July 25, 1950, 65 y.o.   MRN: [id number] GYNECOLOGY  VISIT   HPI: 65 y.o.   Married  Caucasian  female   G2P2002 with Patient's last menstrual period was 12/31/1993.   here for 10 day recheck.   Status post vulvar biopsy showing lichen sclerosus.   Fell on her backside and feels pain in her coccyx.  With the act of siting down and standing up.  Feels like something is shifting.  Does not hurt while sitting.  Has a history of coccyx fracture in past as well.   GYNECOLOGIC HISTORY: Patient's last menstrual period was 12/31/1993. Contraception:  Hysterectomy  Menopausal hormone therapy: none        OB History    Gravida Para Term Preterm AB TAB SAB Ectopic Multiple Living   _0 Patient Active Problem List   Diagnosis Date Noted  . Fatty liver disease, nonalcoholic 63/45/0388  . Atypical chest pain 02/21/2015  . PSVT (paroxysmal supraventricular tachycardia)   . Diabetes mellitus type 2 with retinopathy   . IRRITABLE BOWEL SYNDROME 03/05/2008  . DISORDER, BIPOLAR NEC 09/19/2007  . SLEEP APNEA 09/19/2007  . Hypothyroidism 08/26/2007  . OSTEOARTHRITIS 08/26/2007  . Hyperlipidemia 10/31/2006  . OBESITY, MORBID 10/31/2006  . ANXIETY 10/31/2006  . Depression 10/31/2006  . Essential hypertension 10/31/2006  . ALLERGIC RHINITIS 10/31/2006  . ASTHMA 10/31/2006  . DIVERTICULOSIS, COLON 10/31/2006  . COLONIC POLYPS 11/29/2005    Past Medical History  Diagnosis Date  . Allergy   . Anxiety   . Asthma   . Depression   . Diabetes mellitus   . Hyperlipidemia   . Hypertension   . IBS (irritable bowel syndrome)   . Thyroid disease   . Arthritis   . OSA (obstructive sleep apnea)   . Dyspareunia     vaginal dryness  . Blood transfusion without reported diagnosis 2002    with knee replacement  . Fibroid     reason for hysterectomy  . STD (sexually transmitted disease)     Hx HPV  . Osteopenia 2009  . Diabetes  mellitus type 2 with retinopathy   . Diverticulitis   . GERD (gastroesophageal reflux disease)     Past Surgical History  Procedure Laterality Date  . Partial colectomy      -RSO  . Total knee arthroplasty Bilateral 2002, 2004  . Oophorectomy    . Abdominal hysterectomy  1995    TAH/LSO--Dr. Ubaldo Glassing  . Rotator cuff repair Left   . Cesarean section  1978  . Foot surgery Bilateral   . Nasal sinus surgery      Current Outpatient Prescriptions  Medication Sig Dispense Refill  . ALPRAZolam (XANAX) 1 MG tablet Take 0.5-2 mg by mouth every 4 (four) hours as needed for anxiety or sleep. Take 1-2 tabs up to 6 times daily    . Ascorbic Acid (VITAMIN C) 1000 MG tablet Take 1,000 mg by mouth daily.    Marland Kitchen aspirin 81 MG tablet Take 81 mg by mouth daily.     Marland Kitchen atorvastatin (LIPITOR) 10 MG tablet TAKE 1 TABLET DAILY OR AS DIRECTED. (Patient taking differently: TAKE 1 TABLET BY MOUTH DAILY OR AS DIRECTED.) 90 tablet 1  . Blood Glucose Monitoring Suppl (ONETOUCH VERIO) W/DEVICE KIT 1 each by Does not apply route daily. 1 kit 0  . Calcium Carbonate-Vit D-Min (CALCIUM 1200 PO)  Take 1 tablet by mouth daily.    . cetirizine (ZYRTEC) 10 MG tablet Take 10 mg by mouth daily.     . Cholecalciferol (VITAMIN D3) 1000 UNITS CAPS Take 1 capsule by mouth daily.      Marland Kitchen co-enzyme Q-10 30 MG capsule Take 30 mg by mouth daily.      . cyanocobalamin 100 MCG tablet Take 100 mcg by mouth daily.      Marland Kitchen desvenlafaxine (PRISTIQ) 100 MG 24 hr tablet Take 100 mg by mouth daily.      . Garlic 294 MG TABS Take 1 tablet by mouth daily.      Marland Kitchen glipiZIDE (GLIPIZIDE XL) 10 MG 24 hr tablet TAKE 1 TABLET DAILY WITH BREAKFAST. 90 tablet 0  . glucose blood (ONETOUCH VERIO) test strip 1 each by Other route 2 (two) times daily. Use as instructed 100 each 11  . Insulin Glargine (LANTUS SOLOSTAR) 100 UNIT/ML Solostar Pen Inject 30-35 units into the skin at bedtime. 15 mL 2  . Insulin Pen Needle (EASY COMFORT PEN NEEDLES) 31G X 5 MM MISC  Use 1x a day 100 each 3  . ipratropium (ATROVENT HFA) 17 MCG/ACT inhaler Inhale 2 puffs into the lungs every 4 (four) hours as needed for wheezing. 1 Inhaler 12  . lamoTRIgine (LAMICTAL) 200 MG tablet Take 400 mg by mouth daily.    Marland Kitchen levothyroxine (SYNTHROID, LEVOTHROID) 50 MCG tablet TAKE 1 TABLET ONCE DAILY. 30 tablet 5  . lisinopril-hydrochlorothiazide (PRINZIDE,ZESTORETIC) 20-12.5 MG per tablet TAKE 1 TABLET ONCE DAILY. (Patient taking differently: TAKE 1 TABLET BY MOUTH ONCE DAILY.) 90 tablet 1  . metoprolol tartrate (LOPRESSOR) 25 MG tablet Take 1 tablet (25 mg total) by mouth 2 (two) times daily. 90 tablet 1  . Omega-3 350 MG CAPS Take 350 mg by mouth daily.      Marland Kitchen omeprazole (PRILOSEC) 20 MG capsule Take 20 mg by mouth daily.    [name] DELICA LANCETS 76L MISC 1 each by Does not apply route 2 (two) times daily. 100 each 3  . [DISCONTINUED] azelastine (ASTELIN) 137 MCG/SPRAY nasal spray 1 spray by Nasal route 2 (two) times daily. Use in each nostril as directed     . [DISCONTINUED] budesonide-formoterol (SYMBICORT) 160-4.5 MCG/ACT inhaler Inhale 2 puffs into the lungs 2 (two) times daily.      . [DISCONTINUED] diphenhydrAMINE (SOMINEX) 25 MG tablet Take 25 mg by mouth at bedtime as needed.      . [DISCONTINUED] fluticasone (FLONASE) 50 MCG/ACT nasal spray 2 sprays by Nasal route daily.      . [DISCONTINUED] temazepam (RESTORIL) 30 MG capsule Take 30 mg by mouth at bedtime as needed.     No current facility-administered medications for this visit.     ALLERGIES: Oxycodone-acetaminophen; Codeine phosphate; Erythromycin; Morphine sulfate; Oxycodone hcl; and Sulfamethoxazole  Family History  Problem Relation Age of Onset  . COPD Mother   . Heart attack Father 66  . Hypertension Father   . Heart attack Sister 70  . Diabetes Sister   . Hypertension Sister   . Hyperlipidemia      fhx  . Hypertension      fhx  . Cancer      ovarian/grandmother  . Ovarian cancer Maternal Aunt   .  Hypertension Sister     History   Social History  . Marital Status: Married    Spouse Name: N/A  . Number of Children: 2  . Years of Education: 18   Occupational History  .  disabled     orthopedic / psych   Social History Main Topics  . Smoking status: Never Smoker   . Smokeless tobacco: Never Used  . Alcohol Use: 0.0 oz/week    0 Standard drinks or equivalent per week     Comment: 2-3 drinks a year  . Drug Use: No  . Sexual Activity:    Partners: Male    Birth Control/ Protection: Surgical     Comment: TAH   Other Topics Concern  . Not on file   Social History Narrative   Grew up in [place]. Currently resides in resides in a house with her husband. 4 dogs. Fun: Play with your dogs and be with grandchildren, needle work.    Denies any religious beliefs effecting healthcare.        ROS:  Pertinent items are noted in HPI.  PHYSICAL EXAMINATION:    BP 110/70 mmHg  Pulse 70  Ht 5' 0.5" (1.537 m)  Wt 226 lb 9.6 oz (102.785 kg)  BMI 43.51 kg/m2  LMP 12/31/1993     General appearance: alert, cooperative and appears stated age  Pelvic: External genitalia:  Ecchymoses without hematoma of the bilateral labia.  Labia minor and major are fused.  Biopsy sites are healed well and no suture is present.                ASSESSMENT  Lichen sclerosus, confirmed on biopsy.  Coccyx pain.   PLAN  Biopsy report reviewed. Mirror used to educate patient regarding where and how to apply to the Clobetasol ointment - bid.  Will return for a recheck in one month.   I anticipate lowering the dosage at that time.  I discussed adrenal suppression with long term use of potent steroids. Patient will see her PCP for evaluation and treatment of coccyx pain.  May need x-ray of lumbosacral spine. I recommended NSAIDs, donut pillow and heating pad.    An After Visit Summary was printed and given to the patient.  ___15___ minutes face to face time of which over 50% was spent in counseling.

## 2015-04-05 ENCOUNTER — Other Ambulatory Visit: Payer: Self-pay | Admitting: Internal Medicine

## 2015-04-06 ENCOUNTER — Encounter: Payer: Self-pay | Admitting: Internal Medicine

## 2015-04-07 ENCOUNTER — Other Ambulatory Visit: Payer: Self-pay | Admitting: *Deleted

## 2015-04-07 MED ORDER — GLUCOPHAGE 500 MG PO TABS: 1000.0000 mg | ORAL_TABLET | Freq: Two times a day (BID) | ORAL | Status: DC

## 2015-04-07 NOTE — Telephone Encounter (Signed)
Per Dr Cruzita Lederer:  OK to send Metformin (we can try DAW Glucophage) 500 mg daily with dinner x 3 days and keep adding 500 mg every 3 days until she gets to 1000 mg 2x a day. Ok to sent #120 with 2 refills. Let us know about the sugars, she may have higher sugars after meals after stopping the glipizide.     Pt voiced understanding and will call next week to let us know how she is doing.

## 2015-04-26 ENCOUNTER — Ambulatory Visit: Payer: Medicare Other | Admitting: Internal Medicine

## 2015-04-28 ENCOUNTER — Encounter: Payer: Self-pay | Admitting: Internal Medicine

## 2015-04-28 ENCOUNTER — Ambulatory Visit (INDEPENDENT_AMBULATORY_CARE_PROVIDER_SITE_OTHER): Payer: Medicare Other | Admitting: Internal Medicine

## 2015-04-28 ENCOUNTER — Other Ambulatory Visit: Payer: Self-pay | Admitting: *Deleted

## 2015-04-28 DIAGNOSIS — E039 Hypothyroidism, unspecified: Secondary | ICD-10-CM

## 2015-04-28 DIAGNOSIS — E11319 Type 2 diabetes mellitus with unspecified diabetic retinopathy without macular edema: Secondary | ICD-10-CM

## 2015-04-28 NOTE — Patient Instructions (Signed)
Please decrease the Lantus dose to 15 units at bedtime. If the sugars continue to be low/normal, you can decrease the dose further, to 10 units.  Continue Metformin 1000 mg 2x a day.  Please stop at the lab.  Please return in 3 months with your sugar log.

## 2015-04-28 NOTE — Progress Notes (Signed)
Patient ID: Meredith Hayden, female   DOB: [birth year], 65 y.o.   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 Last visit 2 mo ago.   She has severe depression >> can impact her DM control.   Her depression is better lately, so that she can take care of her diet and can exercise. She started a plant based diet with no complex carbs (except cottage cheese and an occasional latte) + 2x a week meat - Excell Seltzer nutrarian diet  (specifically for DM).  DM2: Last hemoglobin A1c was: Lab Results  Component Value Date   HGBA1C 9.8* 01/26/2015   HGBA1C 10.0* 12/09/2014   HGBA1C 9.8* 06/14/2014   Pt is on a regimen of: - Lantus 20 >> 30 >> 20 units hs (pens)  - Metformin 1000 mg 2x a day >> started 3 weeks ago - some abdominal discomfort She was Glipizide XL 10 mg in am - added 07/23/2014 She was on Metformin before >> N/V/D/AP She tried Glyburide >> no SEs She tried Januvia.  Also takes: - Turmeric curcumin 500 mg 2x a day - Cinnamon + chromium (2000+400) 2x a day  Pt checks her sugars 0-2x a day and they are much better in the last week >> improved diet and started to walk. - before b'fast: 129-168 >> 158-213, 286 >> 101-169 >> 87-125, 144 - 2h after b'fast: when we started tx: 195-225 >> 202 (after gummies) >> n/c - lunch: 125 >> 250-265 >> 128 >> 101-141 - 2h after lunch: 170 >> n/c >> 122 - dinner: 161 >> 203-231 >> 99 >> n/c - 2h after dinner: 194 >> n/c >> n/c >> 88-140 - bedtime: n/c >> n/c >> 142 - nighttime: 83 (after this, she decreased the Lantus to 25 units) >> 109-129 No lows. Lowest sugar was 189 >> 125 >> 158 >> 83 >> 87; she has hypoglycemia awareness at 80.  Highest sugar was 259 >> 194 >> 286 >> 202 >> 152 lately.  Pt's meals are - no regular pattern - she wakes up at different times of the day: -  Breakfast: cottage cheese + fruit + almonds - Lunch: apple + almond butter - Dinner: biggest meal of the day: meat + veggies ; sometimes just beans + kale + mushrooms - Snacks: anything; several times a day: almonds, sandwich, crackers  - She has CKD, last BUN/creatinine:  Lab Results  Component Value Date   BUN 13 01/27/2015   CREATININE 0.90 01/27/2015  On Lisinopril. 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  - no numbness and tingling in her feet.  Hypothyroidism: - dx several years ago - on LT4 50 mcg daily as she wakes up - takes calcium and Protonix later in the day  - last TSH: Lab Results  Component Value Date   TSH 1.655 01/25/2015   TSH 1.610 12/09/2014   TSH 3.11 11/02/2013   TSH 4.80 01/12/2011   TSH 4.32 09/12/2007   FREET4 0.90 12/09/2014   She is seeing Dr Toy Care >> referred to a psychiatric neurologist [facility]) for ECT, which  was planned 11/2014, but her EKG was abnormal >> PSVT (prolonged QT), she was admitted for PSVT on 01/25/2015. On Lopressor.   She also has a fatty liver.  I reviewed pt's medications, allergies, PMH, social hx, family hx, and changes were documented in the history of present illness. Otherwise, unchanged from my initial visit note.  ROS: Constitutional: no weight gain/loss, no fatigue, no subjective hyperthermia/hypothermia Eyes: no blurry vision, no xerophthalmia ENT: no sore throat, no nodules palpated in throat, no dysphagia/odynophagia, no hoarseness Cardiovascular: no CP/SOB/+ palpitations/no leg swelling Respiratory: no cough/SOB Gastrointestinal: no N/V/D/C Musculoskeletal: no muscle/joint aches Skin: no rashes Neurological: no tremors/numbness/tingling/dizziness Psychiatric: + depression/+  anxiety  PE: BP 114/62 mmHg  Pulse 73  Temp(Src) 98.1 F (36.7 C) (Oral)  Resp 12  Wt 222 lb 3.2 oz (100.789 kg)  SpO2 94%  LMP 12/31/1993 Body mass index is 42.66 kg/(m^2).  Wt Readings from Last 3 Encounters:  04/28/15 222 lb 3.2 oz (100.789 kg)  03/30/15 226 lb 9.6 oz (102.785 kg)  03/16/15 226 lb 9.6 oz (102.785 kg)   Constitutional: overweight, in NAD Eyes: PERRLA, EOMI, no exophthalmos ENT: moist mucous membranes, no thyromegaly, no cervical lymphadenopathy Cardiovascular: RRR, No MRG Respiratory: CTA B Gastrointestinal: abdomen soft, NT, ND, BS+ Musculoskeletal: no deformities, strength intact in all 4 Skin: moist, warm, no rashes Neurological: no tremor with outstretched hands, DTR normal in all 4  ASSESSMENT: 1. 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 Hypothyroidism  Pt's care team: - cardiology: Dr Johnsie Cancel - psychiatry: Dr Toy Care - orthopedics: Dr Percell Miller - GI: Dr Sharlett Iles - Gyn: Dr Quincy Simmonds - Dermatology: Dr Martinique - Podiatry: Dr Blenda Mounts  PLAN:  1. Patient with long-standing, uncontrolled diabetes, on basal insulin + metformin, with her control affected by her severe depression. Her sugars MUCH improved lately, due to improved diet and switching to Metformin. She is tolerating this much better now. We can decrease insulin since she c/o occasional sweating at night and sugars (did not check sugars- advised to do so!) are much better. Congratulated her on the DM control and weight loss! - I advised her to: : Patient Instructions  Please decrease the Lantus dose to 15 units at bedtime. If the sugars continue to be low/normal, you can decrease the dose further, to 10 units.  Continue Metformin 1000 mg 2x a day.  Please stop at the lab.  Please return in 3 months with your sugar log.   - continue checking sugars at different times of the day - check 2 times a day, rotating checks - up to date with eye exams - check  HbA1c today - Return to clinic in 3 mo with sugar log  2. Hypothyroidism - reviewed latest TFTs with pt >> normal - she takes the thyroid hormone every day, with water, >30 minutes before breakfast, separated by >4 hours from acid reflux medications, calcium, iron, multivitamins. - recheck TFTs today   Office Visit on 04/28/2015  Component Date Value Ref Range Status  . TSH 04/28/2015 2.65  0.35 - 4.50 uIU/mL Final  . Free T4 04/28/2015 0.70  0.60 - 1.60 ng/dL Final  . Hgb A1c MFr Bld 04/28/2015 7.0* 4.6 - 6.5 % Final   Glycemic Control Guidelines for People with Diabetes:Non Diabetic:  <6%Goal of Therapy: <7%Additional Action Suggested:  >8%    Impressive decrease in hemoglobin A1c! Normal thyroid tests.

## 2015-04-29 LAB — T4, FREE: Free T4: 0.7 ng/dL (ref 0.60–1.60)

## 2015-04-29 LAB — TSH: TSH: 2.65 u[IU]/mL (ref 0.35–4.50)

## 2015-04-29 LAB — HEMOGLOBIN A1C: Hgb A1c MFr Bld: 7 % — ABNORMAL HIGH (ref 4.6–6.5)

## 2015-05-04 ENCOUNTER — Telehealth: Payer: Self-pay | Admitting: Obstetrics and Gynecology

## 2015-05-04 ENCOUNTER — Ambulatory Visit: Payer: Medicare Other | Admitting: Obstetrics and Gynecology

## 2015-05-04 NOTE — Telephone Encounter (Signed)
Called and spoke with patient as Meredith Hayden missed her 1 month recheck appointment with Dr. Quincy Simmonds today. Meredith Hayden said, "I completely forgot about the appointment." Meredith Hayden will call back to reschedule.

## 2015-05-04 NOTE — Telephone Encounter (Signed)
Thank you for the update!

## 2015-06-02 ENCOUNTER — Encounter: Payer: Self-pay | Admitting: Cardiology

## 2015-06-26 ENCOUNTER — Other Ambulatory Visit: Payer: Self-pay | Admitting: Internal Medicine

## 2015-07-05 ENCOUNTER — Other Ambulatory Visit: Payer: Self-pay | Admitting: Internal Medicine

## 2015-07-07 ENCOUNTER — Other Ambulatory Visit: Payer: Self-pay | Admitting: Internal Medicine

## 2015-07-28 ENCOUNTER — Ambulatory Visit: Payer: Medicare Other | Admitting: Internal Medicine

## 2015-08-31 ENCOUNTER — Encounter: Payer: Self-pay | Admitting: Internal Medicine

## 2015-09-01 ENCOUNTER — Telehealth: Payer: Self-pay | Admitting: Obstetrics and Gynecology

## 2015-09-01 NOTE — Telephone Encounter (Signed)
Left message regarding schedule change.

## 2015-09-06 ENCOUNTER — Other Ambulatory Visit: Payer: Self-pay | Admitting: Family

## 2015-09-19 ENCOUNTER — Other Ambulatory Visit: Payer: Self-pay | Admitting: Internal Medicine

## 2015-09-30 ENCOUNTER — Other Ambulatory Visit (INDEPENDENT_AMBULATORY_CARE_PROVIDER_SITE_OTHER): Payer: Medicare Other | Admitting: *Deleted

## 2015-09-30 ENCOUNTER — Encounter: Payer: Self-pay | Admitting: Internal Medicine

## 2015-09-30 ENCOUNTER — Ambulatory Visit (INDEPENDENT_AMBULATORY_CARE_PROVIDER_SITE_OTHER): Payer: Medicare Other | Admitting: Internal Medicine

## 2015-09-30 VITALS — BP 114/68 | HR 71 | Temp 99.1°F | Resp 12 | Wt 220.0 lb

## 2015-09-30 DIAGNOSIS — E11319 Type 2 diabetes mellitus with unspecified diabetic retinopathy without macular edema: Secondary | ICD-10-CM

## 2015-09-30 DIAGNOSIS — Z23 Encounter for immunization: Secondary | ICD-10-CM | POA: Diagnosis not present

## 2015-09-30 LAB — POCT GLYCOSYLATED HEMOGLOBIN (HGB A1C): Hemoglobin A1C: 6.3

## 2015-09-30 MED ORDER — METFORMIN HCL ER 500 MG PO TB24: 1000.0000 mg | ORAL_TABLET | Freq: Two times a day (BID) | ORAL | Status: DC

## 2015-09-30 MED ORDER — METFORMIN HCL 500 MG PO TABS: ORAL_TABLET | ORAL | Status: DC

## 2015-09-30 NOTE — Progress Notes (Signed)
Patient ID: Meredith Hayden, female   DOB: 09-24-1950, 65 y.o.   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 Last visit 2 mo ago.   She has severe depression >> can impact her DM control. She was doing better at last visit >> now gain depressed, stressed as her puppies were very sick.  Before last visit, she was telling me that she started a plant based diet with no complex carbs (except cottage cheese and an occasional latte) + 2x a week meat - Excell Seltzer nutrarian diet  (specifically for DM).  She came off the diet unfortunately >> poor diet again, some days just snacking.  Her insurance does not cover Lantus >> she had periods of weeks off the insulin.   DM2: Last hemoglobin A1c was: Lab Results  Component Value Date   HGBA1C 7.0* 04/28/2015   HGBA1C 9.8* 01/26/2015   HGBA1C 10.0* 12/09/2014   Pt is on a regimen of: - Lantus 20 >> 30 >> 20 >> 15 >> 20 units hs (pens) - on and off - Metformin 1000 mg 2x a day >> abd. distention and flatulence She was Glipizide XL 10 mg in am - added 07/23/2014 She was on Metformin before >> N/V/D/AP She tried Glyburide >> no SEs She tried Januvia.  Also takes: - Turmeric curcumin 500 mg 2x a day - Cinnamon + chromium (2000+400) 2x a day  Pt is not checking sugars. Reviewed sugars at last visit. - before b'fast: 129-168 >> 158-213, 286 >> 101-169 >> 87-125, 144 - 2h after b'fast: when we started tx: 195-225 >> 202 (after gummies) >> n/c - lunch: 125 >> 250-265 >> 128 >> 101-141 - 2h after lunch: 170 >> n/c >> 122 - dinner: 161 >> 203-231 >> 99 >> n/c - 2h after dinner: 194 >> n/c >> n/c >> 88-140 - bedtime: n/c >> n/c >> 142 - nighttime: 83 (after this, she decreased the Lantus to 25 units) >> 109-129 No lows. Lowest sugar was 189 >> 125 >> 158 >> 83 >> 87; she has  hypoglycemia awareness at 80.  Highest sugar was 259 >> 194 >> 286 >> 202 >> 152 lately.  Pt's meals are - no regular pattern - she wakes up at different times of the day: - Breakfast: cottage cheese + fruit + almonds - Lunch: apple + almond butter - Dinner: biggest meal of the day: meat + veggies ; sometimes just beans + kale + mushrooms - Snacks: anything; several times a day: almonds, sandwich, crackers  - She has CKD, last BUN/creatinine:  Lab Results  Component Value Date   BUN 13 01/27/2015   CREATININE 0.90 01/27/2015  On Lisinopril. 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  - no numbness and tingling in her feet.  Hypothyroidism: - dx several years ago - on LT4 50 mcg daily as she wakes up - missed doses frequently - takes calcium and Protonix later in the day  - last TSH: Lab Results  Component Value Date   TSH 2.65 04/28/2015   TSH 1.655 01/25/2015   TSH  1.610 12/09/2014   TSH 3.11 11/02/2013   TSH 4.80 01/12/2011   FREET4 0.70 04/28/2015   FREET4 0.90 12/09/2014   She is seeing Dr Toy Care >> referred to a psychiatric neurologist [facility]) for ECT, which was planned 11/2014, but her EKG was abnormal >> PSVT (prolonged QT), she was admitted for PSVT on 01/25/2015. On Lopressor.  She also has a fatty liver.  I reviewed pt's medications, allergies, PMH, social hx, family hx, and changes were documented in the history of present illness. Otherwise, unchanged from my initial visit note.  ROS: Constitutional: no weight gain/loss, no fatigue, no subjective hyperthermia/hypothermia Eyes: no blurry vision, no xerophthalmia ENT: no sore throat, no nodules palpated in throat, no dysphagia/odynophagia, no hoarseness Cardiovascular: no  CP/SOB/palpitations/no leg swelling Respiratory: no cough/SOB Gastrointestinal: no N/V/D/C Musculoskeletal: no muscle/joint aches Skin: no rashes, + hair loss Neurological: no tremors/numbness/tingling/dizziness Psychiatric: + depression/+ anxiety  PE: BP 114/68 mmHg  Pulse 71  Temp(Src) 99.1 F (37.3 C) (Oral)  Resp 12  Wt 220 lb (99.791 kg)  SpO2 96%  LMP 12/31/1993 Body mass index is 42.24 kg/(m^2).  Wt Readings from Last 3 Encounters:  09/30/15 220 lb (99.791 kg)  04/28/15 222 lb 3.2 oz (100.789 kg)  03/30/15 226 lb 9.6 oz (102.785 kg)   Constitutional: overweight, in NAD Eyes: PERRLA, EOMI, no exophthalmos ENT: moist mucous membranes, no thyromegaly, no cervical lymphadenopathy Cardiovascular: RRR, No MRG Respiratory: CTA B Gastrointestinal: abdomen soft, NT, ND, BS+ Musculoskeletal: no deformities, strength intact in all 4 Skin: moist, warm, no rashes Neurological: no tremor with outstretched hands, DTR normal in all 4  ASSESSMENT: 1. 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 Hypothyroidism  Pt's care team: - cardiology: Dr Johnsie Cancel - psychiatry: Dr Toy Care - orthopedics: Dr Percell Miller - GI: Dr Sharlett Iles - Gyn: Dr Quincy Simmonds - Dermatology: Dr Martinique - Podiatry: Dr Blenda Mounts  PLAN:  1. Patient with long-standing, uncontrolled diabetes, on basal insulin + metformin, with her control affected by her severe depression. She did not check sugars, follow her diet or take insulin consistently in last few months... we checked an HbA1c today >> 6.3% (surprising!). In this case, I advised her to continue w/o insulin (if we start this again in the future, needs Levemir). Strongly advised her to improve her diet. - I advised her to: Patient Instructions  Please stop regular Metformin and start Metformin XR 1000 mg 2x day.  Stay off insulin for now.  Please come back for a follow-up appointment in 1 month - with your log.   - start checking  sugars at different times of the day - check 1-2 times a day, rotating checks - not up to date with eye exams >> advised to schedule - given flu shot - Return to clinic in 1.5 mo with sugar log  2. Hypothyroidism - reviewed latest TFTs with pt (04/2015) >> normal - she mentions noncompliance with the LT4 >> strongly advised her to take it every day! - she takes the thyroid hormone with water, >30 minutes before breakfast, separated by >4 hours from acid reflux medications, calcium, iron, multivitamins. - will recheck TFTs at next visit

## 2015-09-30 NOTE — Addendum Note (Signed)
Addended by: Philemon Kingdom on: 09/30/2015 12:26 PM   Modules accepted: Orders, Medications

## 2015-09-30 NOTE — Addendum Note (Signed)
Addended by: Philemon Kingdom on: 09/30/2015 12:24 PM   Modules accepted: Orders, Medications

## 2015-09-30 NOTE — Patient Instructions (Signed)
Please stop regular Metformin and start Metformin XR 1000 mg 2x day.  Stay off insulin for now.  Please come back for a follow-up appointment in 1 month - with your log.

## 2015-10-06 ENCOUNTER — Other Ambulatory Visit: Payer: Self-pay | Admitting: Family

## 2015-10-19 ENCOUNTER — Other Ambulatory Visit: Payer: Self-pay | Admitting: Family

## 2015-11-01 ENCOUNTER — Ambulatory Visit: Payer: Medicare Other | Admitting: Podiatry

## 2015-11-07 ENCOUNTER — Other Ambulatory Visit: Payer: Self-pay | Admitting: Family

## 2015-11-18 ENCOUNTER — Other Ambulatory Visit: Payer: Self-pay | Admitting: Family

## 2015-11-18 ENCOUNTER — Other Ambulatory Visit: Payer: Self-pay | Admitting: Internal Medicine

## 2015-11-23 ENCOUNTER — Ambulatory Visit: Payer: Medicare Other | Admitting: Internal Medicine

## 2015-11-29 ENCOUNTER — Ambulatory Visit: Payer: Medicare Other | Admitting: Podiatry

## 2015-12-06 ENCOUNTER — Other Ambulatory Visit: Payer: Self-pay | Admitting: Family

## 2015-12-08 ENCOUNTER — Encounter: Payer: Self-pay | Admitting: Internal Medicine

## 2015-12-08 ENCOUNTER — Ambulatory Visit (INDEPENDENT_AMBULATORY_CARE_PROVIDER_SITE_OTHER): Payer: Medicare Other | Admitting: Internal Medicine

## 2015-12-08 VITALS — BP 118/80 | HR 81 | Temp 98.3°F | Resp 12 | Wt 229.8 lb

## 2015-12-08 DIAGNOSIS — E11319 Type 2 diabetes mellitus with unspecified diabetic retinopathy without macular edema: Secondary | ICD-10-CM

## 2015-12-08 DIAGNOSIS — E039 Hypothyroidism, unspecified: Secondary | ICD-10-CM

## 2015-12-08 MED ORDER — ATORVASTATIN CALCIUM 10 MG PO TABS: ORAL_TABLET | ORAL | Status: DC

## 2015-12-08 NOTE — Progress Notes (Signed)
Patient ID: Meredith Hayden, female   DOB: 1950/09/14, 65 y.o.   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 Last visit 2.5 mo ago.   She has severe depression >> can impact her DM control. She was doing better at last visit >> now gain depressed, again and feeling she has more memory loss.  Before last visit, she was telling me that she started a plant based diet with no complex carbs (except cottage cheese and an occasional latte) + 2x a week meat - Excell Seltzer nutrarian diet  (specifically for DM). At last visit, she came off the diet unfortunately >> poor diet again,  Which she continues now. At this visit, she is up 9 lbs.  Her insurance does not cover Lantus >> she had periods of weeks off the insulin.   DM2: Last hemoglobin A1c was: Lab Results  Component Value Date   HGBA1C 6.3 09/30/2015   HGBA1C 7.0* 04/28/2015   HGBA1C 9.8* 01/26/2015   Pt is on a regimen of: - Lantus 20 >> 30 >> 20 >> 15 >> 20 units hs (pens) - off now - Metformin 1000 mg 2x a day >> abd. distention and flatulence >> not on Metformin XR 1000 mg 2x a day -  She was Glipizide XL 10 mg in am - added 07/23/2014 She was on Metformin before >> N/V/D/AP She tried Glyburide >> no SEs She tried Januvia.  Pt is not checking sugars, again.  - before b'fast: 129-168 >> 158-213, 286 >> 101-169 >> 87-125, 144 >> n/c - 2h after b'fast: when we started tx: 195-225 >> 202 (after gummies) >> n/c - lunch: 125 >> 250-265 >> 128 >> 101-141 >> n/c - 2h after lunch: 170 >> n/c >> 122 >> n/c - dinner: 161 >> 203-231 >> 99 >> n/c >> 126 - 2h after dinner: 194 >> n/c >> n/c >> 88-140 >> n/c - bedtime: n/c >> n/c >> 142 - nighttime: 83 (after this, she decreased the Lantus to 25 units) >> 109-129 No lows. Lowest sugar was 189 >> 125 >> 158 >> 83 >> 87 >> ?;  she has hypoglycemia awareness at 80.  Highest sugar was 259 >> 194 >> 286 >> 202 >> 152 >> ?  Pt's meals are - no regular pattern - she wakes up at different times of the day: - Breakfast: cottage cheese + fruit + almonds - Lunch: apple + almond butter - Dinner: biggest meal of the day: meat + veggies ; sometimes just beans + kale + mushrooms - Snacks: anything; several times a day: almonds, sandwich, crackers  - She has CKD, last BUN/creatinine:  Lab Results  Component Value Date   BUN 13 01/27/2015   CREATININE 0.90 01/27/2015  On Lisinopril. - last set of lipids: Lab Results  Component Value Date   CHOL 157 01/26/2015   HDL 35* 01/26/2015   LDLCALC 86 01/26/2015   LDLDIRECT 77.1 11/02/2013   TRIG 178* 01/26/2015   CHOLHDL 4.5 01/26/2015  Off Lipitor now - stopped 6 weeks ago. On Omega 3 FA. 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  - no numbness and tingling in her feet.  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 On Lopressor.  She also has a fatty liver.  I reviewed pt's medications, allergies, PMH, social hx, family hx, and changes were documented in the history of present illness. Otherwise, unchanged from my initial visit note.  ROS: Constitutional: no weight gain/loss, no fatigue, no subjective hyperthermia/hypothermia Eyes: no blurry vision, no xerophthalmia ENT: no sore throat, no nodules palpated in throat, no  dysphagia/odynophagia, no hoarseness Cardiovascular: no CP/SOB/palpitations/no leg swelling Respiratory: no cough/SOB Gastrointestinal: no N/V/D/C Musculoskeletal: no muscle/joint aches Skin: no rashes, + hair loss Neurological: no tremors/numbness/tingling/dizziness + depression  PE: BP 118/80 mmHg  Pulse 81  Temp(Src) 98.3 F (36.8 C) (Oral)  Resp 12  Wt 229 lb 12.8 oz (104.237 kg)  SpO2 94%  LMP 12/31/1993 Body mass index is 44.12 kg/(m^2).  Wt Readings from Last 3 Encounters:  12/08/15 229 lb 12.8 oz (104.237 kg)  09/30/15 220 lb (99.791 kg)  04/28/15 222 lb 3.2 oz (100.789 kg)   Constitutional: overweight, in NAD Eyes: PERRLA, EOMI, no exophthalmos ENT: moist mucous membranes, no thyromegaly, no cervical lymphadenopathy Cardiovascular: RRR, No MRG Respiratory: CTA B Gastrointestinal: abdomen soft, NT, ND, BS+ Musculoskeletal: no deformities, strength intact in all 4 Skin: moist, warm, no rashes Neurological: no tremor with outstretched hands, DTR normal in all 4  ASSESSMENT: 1. 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 Hypothyroidism  Pt's care team: - cardiology: Dr Johnsie Cancel - psychiatry: Dr Toy Care - orthopedics: Dr Percell Miller - GI: Dr Sharlett Iles - Gyn: Dr Quincy Simmonds - Dermatology: Dr [place] - Podiatry: Dr Blenda Mounts  PLAN:  1. Patient with long-standing, uncontrolled diabetes, on metformin only, with her control affected by her severe depression. She did not check sugars, follow her diet or take insulin consistently before last visit, but we checked an HbA1c then >> 6.3% (surprisingly good!) >> I advised her to continue without insulin (if we start this again in the future, needs Levemir) and strongly advised her to improve her diet. I do not have sugars now as she is not checking again... She also did not improve her diet... She promisses to send me a sugar log or the next week. - I advised her to: Patient Instructions  Please  continue Metformin 1000 mg 2x day and start Metformin XR after this.  Please come back for a follow-up appointment in 1.5 months - with your log.  - start checking sugars at different times of the day - check 1-2 times a day, rotating checks - not up to date with eye exams >> advised to schedule - given flu shot at last visit - Return to clinic in 1.5 mo with sugar log  2. Hypothyroidism - reviewed latest TFTs with pt (04/2015) >> normal - she has intermittent compliance with the LT4 50 mcg >> strongly advised her to take it every day! - she takes the thyroid hormone with water, >30 minutes before breakfast, separated by >4 hours from acid reflux medications, calcium, iron, multivitamins. - will recheck TFTs at next visit per  her preferrence

## 2015-12-08 NOTE — Patient Instructions (Addendum)
Please continue Metformin 1000 mg 2x day and start Metformin XR after this.  Please come back for a follow-up appointment in 1.5 months - with your log.

## 2015-12-20 ENCOUNTER — Other Ambulatory Visit: Payer: Self-pay | Admitting: Family

## 2015-12-21 ENCOUNTER — Other Ambulatory Visit: Payer: Self-pay | Admitting: *Deleted

## 2015-12-21 MED ORDER — METFORMIN HCL ER 500 MG PO TB24: 1000.0000 mg | ORAL_TABLET | Freq: Two times a day (BID) | ORAL | Status: DC

## 2016-01-20 ENCOUNTER — Telehealth: Payer: Self-pay

## 2016-01-20 ENCOUNTER — Other Ambulatory Visit: Payer: Self-pay | Admitting: Family

## 2016-01-20 NOTE — Telephone Encounter (Signed)
30 day supply synthroid sent to patients pharmacy---patient states she would rather dr gherghe follow her labs/appt needed for this medication---patient is to check with dr gherhe's office to see if that provider will assume rx refills for synthroid and call our office back if dr Cruzita Lederer does NOT want to do that---if patient calls back, she will need wellness visit with greg calone by feb 20th, and thyroid labs done at that visit before we can send any further rx requests for synthroid to pharm--patient repeated back for understanding

## 2016-02-02 ENCOUNTER — Ambulatory Visit: Payer: Medicare Other | Admitting: Internal Medicine

## 2016-02-02 ENCOUNTER — Other Ambulatory Visit: Payer: Self-pay | Admitting: Family

## 2016-02-07 ENCOUNTER — Other Ambulatory Visit: Payer: Self-pay

## 2016-02-07 ENCOUNTER — Telehealth: Payer: Self-pay

## 2016-02-07 ENCOUNTER — Other Ambulatory Visit: Payer: Self-pay | Admitting: *Deleted

## 2016-02-07 MED ORDER — LISINOPRIL-HYDROCHLOROTHIAZIDE 20-12.5 MG PO TABS: 1.0000 | ORAL_TABLET | Freq: Every day | ORAL | Status: DC

## 2016-02-07 MED ORDER — METFORMIN HCL ER 500 MG PO TB24: 1000.0000 mg | ORAL_TABLET | Freq: Two times a day (BID) | ORAL | Status: DC

## 2016-02-07 MED ORDER — ATORVASTATIN CALCIUM 10 MG PO TABS: ORAL_TABLET | ORAL | Status: DC

## 2016-02-07 MED ORDER — METOPROLOL TARTRATE 25 MG PO TABS: 25.0000 mg | ORAL_TABLET | Freq: Two times a day (BID) | ORAL | Status: DC

## 2016-02-07 MED ORDER — LEVOTHYROXINE SODIUM 50 MCG PO TABS: 50.0000 ug | ORAL_TABLET | Freq: Every day | ORAL | Status: DC

## 2016-02-07 NOTE — Telephone Encounter (Signed)
LVM letting pt know that for any further refills of medication she needs and OV has not been seen since last march. Did send one month supply of meds.

## 2016-02-14 ENCOUNTER — Other Ambulatory Visit: Payer: Self-pay | Admitting: Family

## 2016-02-21 ENCOUNTER — Ambulatory Visit (INDEPENDENT_AMBULATORY_CARE_PROVIDER_SITE_OTHER): Payer: Medicare Other | Admitting: Internal Medicine

## 2016-02-21 ENCOUNTER — Other Ambulatory Visit (INDEPENDENT_AMBULATORY_CARE_PROVIDER_SITE_OTHER): Payer: Medicare Other | Admitting: *Deleted

## 2016-02-21 ENCOUNTER — Encounter: Payer: Self-pay | Admitting: Internal Medicine

## 2016-02-21 VITALS — BP 118/60 | HR 93 | Temp 98.1°F | Resp 12 | Wt 230.0 lb

## 2016-02-21 DIAGNOSIS — E11319 Type 2 diabetes mellitus with unspecified diabetic retinopathy without macular edema: Secondary | ICD-10-CM

## 2016-02-21 DIAGNOSIS — E1129 Type 2 diabetes mellitus with other diabetic kidney complication: Secondary | ICD-10-CM | POA: Insufficient documentation

## 2016-02-21 DIAGNOSIS — IMO0002 Reserved for concepts with insufficient information to code with codable children: Secondary | ICD-10-CM | POA: Insufficient documentation

## 2016-02-21 DIAGNOSIS — E1165 Type 2 diabetes mellitus with hyperglycemia: Secondary | ICD-10-CM

## 2016-02-21 LAB — POCT GLYCOSYLATED HEMOGLOBIN (HGB A1C): Hemoglobin A1C: 7.1

## 2016-02-21 NOTE — Patient Instructions (Signed)
Please continue Metformin ER 1000 mg 2x day.  Please let me know if the sugars are consistently <80 or >200.  Please come back for a follow-up appointment in 3 months - with your log.

## 2016-02-21 NOTE — Progress Notes (Signed)
Patient ID: Meredith Hayden, female   DOB: October 04, 1950, 66 y.o.   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 Last visit 2.5 mo ago.   She has severe depression >> can impact her DM control.   She also has memory problems (aphasia-type) >> feels this is worse, progressing from 2005.  Before last visit, she was telling me that she started a plant based diet with no complex carbs (except cottage cheese and an occasional latte) + 2x a week meat - Excell Seltzer nutrarian diet  (specifically for DM). She is now off the diet and will go back on it for 6 weeks for [place].  DM2: Last hemoglobin A1c was: Lab Results  Component Value Date   HGBA1C 6.3 09/30/2015   HGBA1C 7.0* 04/28/2015   HGBA1C 9.8* 01/26/2015   Pt is on a regimen of: - Metformin 1000 mg 2x a day >> abd. distention and flatulence >> now on Metformin XR 1000 mg 2x a day  She was Glipizide XL 10 mg in am - added 07/23/2014 She was on Metformin before >> N/V/D/AP She tried Glyburide >> no SEs She tried Januvia. He was on Lantus 20 >> 30 >> 20 >> 15 >> 20 units hs (pens) - off now  Pt checks sugars 0-1x a day up to 1 mo ago: - before b'fast: 129-168 >> 158-213, 286 >> 101-169 >> 87-125, 144 >> n/c >> 160-198 - 2h after b'fast: when we started tx: 195-225 >> 202 (after gummies) >> n/c - lunch: 125 >> 250-265 >> 128 >> 101-141 >> n/c >> 110-137, 151 - 2h after lunch: 170 >> n/c >> 122 >> n/c >> 209 - dinner: 161 >> 203-231 >> 99 >> n/c >> 126 >> 151-176 - 2h after dinner: 194 >> n/c >> n/c >> 88-140 >> n/c >> 174-236 - bedtime: n/c >> n/c >> 142 >> n/c - nighttime: 83 (after this, she decreased the Lantus to 25 units) >> 109-129 >> n/c No lows. Lowest sugar was 189 >> 125 >> 158 >> 83 >> 87 >> ? >> 110; she has hypoglycemia awareness at 80.  Highest sugar was 259  >> 194 >> 286 >> 202 >> 152 >> ? >> 236  Pt's meals are - no regular pattern - she wakes up at different times of the day: - Breakfast: cottage cheese + fruit + almonds - Lunch: apple + almond butter - Dinner: biggest meal of the day: meat + veggies ; sometimes just beans + kale + mushrooms - Snacks: anything; several times a day: almonds, sandwich, crackers  - She has CKD, last BUN/creatinine:  Lab Results  Component Value Date   BUN 13 01/27/2015   CREATININE 0.90 01/27/2015  On Lisinopril. - last set of lipids: Lab Results  Component Value Date   CHOL 157 01/26/2015   HDL 35* 01/26/2015   LDLCALC 86 01/26/2015   LDLDIRECT 77.1 11/02/2013   TRIG 178* 01/26/2015   CHOLHDL 4.5 01/26/2015  On Lipitor. On Omega 3 FA. 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  - no numbness and tingling in her feet.  Hypothyroidism: - dx several years ago - on LT4 50 mcg daily as she wakes up - missed doses less frequently now - takes calcium and Protonix later in the day  - last TSH: Lab Results  Component Value  Date   TSH 2.65 04/28/2015   TSH 1.655 01/25/2015   TSH 1.610 12/09/2014   TSH 3.11 11/02/2013   TSH 4.80 01/12/2011   FREET4 0.70 04/28/2015   FREET4 0.90 12/09/2014   She is seeing Dr Toy Care >> referred to a [facility] neurologist [facility]) for ECT, which was planned 11/2014, but her EKG was abnormal >> PSVT (prolonged QT), she was admitted for PSVT on 01/25/2015. On Lopressor.   She also has a fatty liver.  I reviewed pt's medications, allergies, PMH, social hx, family hx, and changes were documented in the history of present illness. Otherwise, unchanged from my initial visit note.  ROS: Constitutional: + weight gain, no fatigue, no subjective hyperthermia/hypothermia Eyes: no blurry vision, no xerophthalmia ENT: no sore throat, no nodules palpated in throat, no dysphagia/odynophagia, no hoarseness Cardiovascular: no CP/SOB/palpitations/no leg  swelling Respiratory: + cough (URI)/no SOB Gastrointestinal: no N/V/D/C Musculoskeletal: no muscle/joint aches Skin: + rash, + hair loss Neurological: no tremors/numbness/tingling/dizziness + depression  PE: BP 118/60 mmHg  Pulse 93  Temp(Src) 98.1 F (36.7 C) (Oral)  Resp 12  Wt 230 lb (104.327 kg)  SpO2 95%  LMP 12/31/1993 Body mass index is 44.16 kg/(m^2).  Wt Readings from Last 3 Encounters:  02/21/16 230 lb (104.327 kg)  12/08/15 229 lb 12.8 oz (104.237 kg)  09/30/15 220 lb (99.791 kg)   Constitutional: overweight, in NAD Eyes: PERRLA, EOMI, no exophthalmos ENT: moist mucous membranes, no thyromegaly, no cervical lymphadenopathy Cardiovascular: RRR, No MRG Respiratory: CTA B Gastrointestinal: abdomen soft, NT, ND, BS+ Musculoskeletal: no deformities, strength intact in all 4 Skin: moist, warm, no rashes Neurological: no tremor with outstretched hands, DTR normal in all 4  ASSESSMENT: 1. 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 Hypothyroidism  3. Poor memory  Pt's care team: - cardiology: Dr Johnsie Cancel - psychiatry: Dr Toy Care - orthopedics: Dr Percell Miller - GI: Dr Sharlett Iles - Gyn: Dr Quincy Simmonds - Dermatology: Dr [place] - Podiatry: Dr Blenda Mounts  PLAN:  1. Patient with long-standing, uncontrolled diabetes, on metformin only now (was able to come insulin with a nutritarian diet!), with her control affected by her severe depression and poor diet.  She did not check sugars consistently since last visit, but we do have some values from 1 mo ago - checked an HbA1c today >> 7.1% (higher, but I would have expected it to be higher than this based on available CBGs...) >> I advised her to continue without insulin for now as she starts the diet back for 6 weeks. - I advised her to: Patient Instructions  Please continue Metformin ER 1000 mg 2x day.  Please let me know if the sugars are consistently <80 or >200.  Please come back for a follow-up  appointment in 3 months - with your log.  - start checking sugars at different times of the day - check 1-2 times a day, rotating checks - not up to date with eye exams >> advised to schedule - given flu shot this season - Return to clinic in 1.5 mo with sugar log  2. Hypothyroidism - reviewed latest TFTs with pt (04/2015) >> normal - she has better compliance with the LT4 50 mcg now, but improving >> again strongly advised her to take it every day! - she takes the thyroid hormone with water, >30 minutes before breakfast, separated by >4 hours from acid reflux medications, calcium, iron, multivitamins. - will recheck TFTs tomorrow  3. Poor memory - will check  a B12 vitamin + MMA at next lab draw  Needs LT4 refills when labs are back.  03/06/2016 Patient did not return for labs yet. We'll addend the results when they become available.

## 2016-02-22 ENCOUNTER — Encounter: Payer: Medicare Other | Admitting: Family

## 2016-03-06 ENCOUNTER — Ambulatory Visit (INDEPENDENT_AMBULATORY_CARE_PROVIDER_SITE_OTHER): Payer: Medicare Other | Admitting: Family

## 2016-03-06 ENCOUNTER — Encounter: Payer: Self-pay | Admitting: Family

## 2016-03-06 ENCOUNTER — Other Ambulatory Visit (INDEPENDENT_AMBULATORY_CARE_PROVIDER_SITE_OTHER): Payer: Medicare Other

## 2016-03-06 VITALS — BP 130/88 | HR 74 | Temp 98.2°F | Resp 18 | Ht 60.5 in | Wt 230.0 lb

## 2016-03-06 DIAGNOSIS — E11319 Type 2 diabetes mellitus with unspecified diabetic retinopathy without macular edema: Secondary | ICD-10-CM | POA: Diagnosis not present

## 2016-03-06 DIAGNOSIS — Z0000 Encounter for general adult medical examination without abnormal findings: Secondary | ICD-10-CM | POA: Diagnosis not present

## 2016-03-06 DIAGNOSIS — E039 Hypothyroidism, unspecified: Secondary | ICD-10-CM | POA: Diagnosis not present

## 2016-03-06 DIAGNOSIS — E785 Hyperlipidemia, unspecified: Secondary | ICD-10-CM | POA: Diagnosis not present

## 2016-03-06 DIAGNOSIS — Z1231 Encounter for screening mammogram for malignant neoplasm of breast: Secondary | ICD-10-CM | POA: Insufficient documentation

## 2016-03-06 LAB — CBC
HEMATOCRIT: 38 % (ref 36.0–46.0)
HEMOGLOBIN: 12.7 g/dL (ref 12.0–15.0)
MCHC: 33.5 g/dL (ref 30.0–36.0)
MCV: 91.4 fl (ref 78.0–100.0)
PLATELETS: 241 10*3/uL (ref 150.0–400.0)
RBC: 4.15 Mil/uL (ref 3.87–5.11)
RDW: 13.4 % (ref 11.5–15.5)
WBC: 9.5 10*3/uL (ref 4.0–10.5)

## 2016-03-06 LAB — LIPID PANEL
CHOLESTEROL: 162 mg/dL (ref 0–200)
HDL: 46.4 mg/dL (ref >39.00)
LDL Cholesterol: 79 mg/dL (ref 0–99)
NONHDL: 115.15
TRIGLYCERIDES: 179 mg/dL — AB (ref 0.0–149.0)
Total CHOL/HDL Ratio: 3
VLDL: 35.8 mg/dL (ref 0.0–40.0)

## 2016-03-06 LAB — COMPREHENSIVE METABOLIC PANEL
ALK PHOS: 66 U/L (ref 39–117)
ALT: 27 U/L (ref 0–35)
AST: 34 U/L (ref 0–37)
Albumin: 4.5 g/dL (ref 3.5–5.2)
BILIRUBIN TOTAL: 0.5 mg/dL (ref 0.2–1.2)
BUN: 17 mg/dL (ref 6–23)
CALCIUM: 9.5 mg/dL (ref 8.4–10.5)
CO2: 30 mEq/L (ref 19–32)
Chloride: 99 mEq/L (ref 96–112)
Creatinine, Ser: 1.15 mg/dL (ref 0.40–1.20)
GFR: 50.58 mL/min — AB (ref >60.00)
GLUCOSE: 154 mg/dL — AB (ref 70–99)
POTASSIUM: 4.7 meq/L (ref 3.5–5.1)
Sodium: 139 mEq/L (ref 135–145)
TOTAL PROTEIN: 6.8 g/dL (ref 6.0–8.3)

## 2016-03-06 LAB — TSH: TSH: 3.06 u[IU]/mL (ref 0.35–4.50)

## 2016-03-06 MED ORDER — LEVOTHYROXINE SODIUM 50 MCG PO TABS: 50.0000 ug | ORAL_TABLET | Freq: Every day | ORAL | Status: DC

## 2016-03-06 NOTE — Progress Notes (Signed)
Pre visit review using our clinic review tool, if applicable. No additional management support is needed unless otherwise documented below in the visit note. 

## 2016-03-06 NOTE — Progress Notes (Signed)
Subjective:    Patient ID: Meredith Hayden, female    DOB: [birth year], 66 y.o.   MRN: [id number]  Chief Complaint  Patient presents with  . CPE    does need refill of levothyroxine, needs TSH    HPI:  Meredith Hayden is a 66 y.o. female who presents today for an annual wellness visit.   1) Health Maintenance -   Diet - Averaging about 1-3 meals per day with snacks consisting fruits, vegetables, and chicken, sandwiches and cereal. Caffeine intake of about 1-2 cups per day.   Exercise - No structured exercise.   2) Preventative Exams / Immunizations:  Dental -- Due for exam   Vision -- Due for exam   Health Maintenance  Topic Date Due  . Hepatitis C Screening  07-Jul-1950  . HIV Screening  09/26/1967  . FOOT EXAM  12/09/2014  . OPHTHALMOLOGY EXAM  07/29/2015  . INFLUENZA VACCINE  07/31/2016  . HEMOGLOBIN A1C  08/20/2016  . PNEUMOCOCCAL POLYSACCHARIDE VACCINE (2) 09/18/2016  . MAMMOGRAM  03/08/2017  . TETANUS/TDAP  09/18/2017  . COLONOSCOPY  11/23/2018  . ZOSTAVAX  Completed     Immunization History  Administered Date(s) Administered  . Hepatitis A, Adult 03/01/2015  . Influenza Split 09/19/2011  . Influenza Whole 12/08/2007, 10/07/2009, 10/13/2010  . Influenza,inj,Quad PF,36+ Mos 12/09/2013, 09/30/2015  . Influenza-Unspecified 11/26/2014  . Pneumococcal Polysaccharide-23 09/19/2011  . Td 09/19/2007  . Zoster 07/07/2013    RISK FACTORS  Tobacco History  Smoking status  . Never Smoker   Smokeless tobacco  . Never Used     Cardiac risk factors: diabetes mellitus, dyslipidemia, hypertension, obesity (BMI >= 30 kg/m2) and sedentary lifestyle.  Depression Screen  Q1: Over the past two weeks, have you felt down, depressed or hopeless? No  Q2: Over the past two weeks, have you felt little interest or pleasure in doing things? No  Have you lost interest or pleasure in daily life? No  Do you often feel hopeless? No  Do you cry easily over  simple problems? No   Activities of Daily Living In your present state of health, do you have any difficulty performing the following activities?:  Driving? No Managing money?  Some difficulty Feeding yourself? No Getting from bed to chair? No Climbing a flight of stairs? No Preparing food and eating?: No Bathing or showering? No Getting dressed: No Getting to the toilet? No Using the toilet: No Moving around from place to place: No In the past year have you fallen or had a near fall?:No   Home Safety Has smoke detector and wears seat belts. No firearms. No excess sun exposure. Are there smokers in your home (other than you)?  No Do you feel safe at home?  Yes  Hearing Difficulties: No Do you often ask people to speak up or repeat themselves? No Do you experience ringing or noises in your ears? No  Do you have difficulty understanding soft or whispered voices? No    Cognitive Testing  Alert? Yes   Normal Appearance? Yes  Oriented to person? Yes  Place? Yes   Time? Yes  Recall of three objects?  Yes  Can perform simple calculations? Yes  Displays appropriate judgment? Yes  Can read the correct time from a watch face? Yes  Do you feel that you have a problem with memory? Yes  Do you often misplace items? Yes   Advanced Directives have been discussed with the patient? Yes  Current Physicians/Providers and Suppliers  [place], [place] - Internal Medicine 2. Philemon Kingdom, MD - Endocrinology 3. Gala Romney, MD - Gynecology 4.  Fransico Him, MD - Cardiology  Indicate any recent Medical Services you may have received from other than [facility] providers in the past year (date may be approximate).  All answers were reviewed with the patient and necessary referrals were made:  Mauricio Po, FNP   03/06/2016    Allergies  Allergen Reactions  . Oxycodone-Acetaminophen Anaphylaxis    Patient states that she can regular tylenol without any problems  . Codeine  Phosphate     REACTION: unspecified  . Erythromycin Other (See Comments)    Dizzy and sweaty   . Morphine Sulfate Itching    Tolerated hydromorphone   . Oxycodone Hcl     REACTION: unspecified  . Sulfamethoxazole     REACTION: rash     Outpatient Prescriptions Prior to Visit  Medication Sig Dispense Refill  . ALPRAZolam (XANAX) 1 MG tablet Take 0.5-2 mg by mouth every 4 (four) hours as needed for anxiety or sleep. Take 1-2 tabs up to 6 times daily    . Ascorbic Acid (VITAMIN C) 1000 MG tablet Take 1,000 mg by mouth daily.    Marland Kitchen aspirin 81 MG tablet Take 81 mg by mouth daily.     Marland Kitchen atorvastatin (LIPITOR) 10 MG tablet TAKE 1 TABLET DAILY 90 tablet 0  . ATROVENT HFA 17 MCG/ACT inhaler USE 2 PUFFS EVERY 4 HOURS AS NEEDED FOR WHEEZING. 12.9 g 0  . Blood Glucose Monitoring Suppl (ONETOUCH VERIO) W/DEVICE KIT 1 each by Does not apply route daily. 1 kit 0  . Calcium Carbonate-Vit D-Min (CALCIUM 1200 PO) Take 1 tablet by mouth daily.    . cetirizine (ZYRTEC) 10 MG tablet Take 10 mg by mouth daily.     . Cholecalciferol (VITAMIN D3) 1000 UNITS CAPS Take 1 capsule by mouth daily.      . clobetasol ointment (TEMOVATE) 8.82 % Apply 1 application topically 2 (two) times daily. Apply as a thin layer to the affected areas. 60 g 1  . co-enzyme Q-10 30 MG capsule Take 30 mg by mouth daily.      . cyanocobalamin 100 MCG tablet Take 100 mcg by mouth daily.      Marland Kitchen desvenlafaxine (PRISTIQ) 100 MG 24 hr tablet Take 100 mg by mouth daily.      . Garlic 800 MG TABS Take 1 tablet by mouth daily.      . Insulin Pen Needle (EASY COMFORT PEN NEEDLES) 31G X 5 MM MISC Use 1x a day 100 each 3  . lamoTRIgine (LAMICTAL) 200 MG tablet Take 400 mg by mouth daily.    Marland Kitchen levothyroxine (SYNTHROID, LEVOTHROID) 50 MCG tablet Take 1 tablet (50 mcg total) by mouth daily. 30 tablet 0  . lisinopril-hydrochlorothiazide (PRINZIDE,ZESTORETIC) 20-12.5 MG tablet Take 1 tablet by mouth  daily 90 tablet 1  . metFORMIN (GLUCOPHAGE-XR)  500 MG 24 hr tablet Take 2 tablets (1,000 mg total) by mouth 2 (two) times daily with a meal. 360 tablet 0  . metoprolol tartrate (LOPRESSOR) 25 MG tablet Take 1 tablet by mouth two  times daily 180 tablet 1  . Omega-3 350 MG CAPS Take 350 mg by mouth daily.      Marland Kitchen omeprazole (PRILOSEC) 20 MG capsule Take 20 mg by mouth daily.    [name] DELICA LANCETS 34J MISC USE AS DIRECTED TO CHECK BLOOD SUGAR TWICE DAILY. 100 each 11  . ONETOUCH VERIO  test strip CHECK BLOOD SUGAR TWICE DAILY. 100 each 11  . zolpidem (AMBIEN) 10 MG tablet Take 10 mg by mouth at bedtime as needed for sleep.     No facility-administered medications prior to visit.     Past Medical History  Diagnosis Date  . Allergy   . Anxiety   . Asthma   . Depression   . Diabetes mellitus   . Hyperlipidemia   . Hypertension   . IBS (irritable bowel syndrome)   . Thyroid disease   . Arthritis   . OSA (obstructive sleep apnea)   . Dyspareunia     vaginal dryness  . Blood transfusion without reported diagnosis 2002    with knee replacement  . Fibroid     reason for hysterectomy  . STD (sexually transmitted disease)     Hx HPV  . Osteopenia 2009  . Diabetes mellitus type 2 with retinopathy ([place])   . Diverticulitis   . GERD (gastroesophageal reflux disease)      Past Surgical History  Procedure Laterality Date  . Partial colectomy      -RSO  . Total knee arthroplasty Bilateral 2002, 2004  . Oophorectomy    . Abdominal hysterectomy  1995    TAH/LSO--Dr. Ubaldo Glassing  . Rotator cuff repair Left   . Cesarean section  1978  . Foot surgery Bilateral   . Nasal sinus surgery       Family History  Problem Relation Age of Onset  . COPD Mother   . Heart attack Father 79  . Hypertension Father   . Heart attack Sister 2  . Diabetes Sister   . Hypertension Sister   . Hyperlipidemia      fhx  . Hypertension      fhx  . Cancer      ovarian/grandmother  . Ovarian cancer Maternal Aunt   . Hypertension Sister       Social History   Social History  . Marital Status: Married    Spouse Name: N/A  . Number of Children: 2  . Years of Education: 18   Occupational History  . disabled     orthopedic / psych   Social History Main Topics  . Smoking status: Never Smoker   . Smokeless tobacco: Never Used  . Alcohol Use: 0.0 oz/week    0 Standard drinks or equivalent per week     Comment: 2-3 drinks a year  . Drug Use: No  . Sexual Activity:    Partners: Male    Birth Control/ Protection: Surgical     Comment: TAH   Other Topics Concern  . Not on file   Social History Narrative   Grew up in [place]. Currently resides in resides in a house with her husband. 4 dogs. Fun: Play with your dogs and be with grandchildren, needle work.    Denies any religious beliefs effecting healthcare.        Review of Systems  Constitutional: Denies fever, chills, fatigue, or significant weight gain/loss. HENT: Head: Denies headache or neck pain Ears: Denies changes in hearing, ringing in ears, earache, drainage Nose: Denies discharge, stuffiness, itching, nosebleed, sinus pain Throat: Denies sore throat, hoarseness, dry mouth, sores, thrush Eyes: Denies loss/changes in vision, pain, redness, blurry/double vision, flashing lights Cardiovascular: Denies chest pain/discomfort, tightness, palpitations, shortness of breath with activity, difficulty lying down, swelling, sudden awakening with shortness of breath Respiratory: Denies shortness of breath, cough, sputum production, wheezing Gastrointestinal: Denies dysphasia, heartburn, change in appetite, nausea, change  in bowel habits, rectal bleeding, constipation, yellow skin or eyes Positive for chronic diarrhea Genitourinary: Denies frequency, urgency, burning/pain, blood in urine, incontinence, change in urinary strength. Musculoskeletal: Denies muscle/joint pain, stiffness, back pain, redness or swelling of joints, trauma Skin: Denies rashes, lumps, itching,  dryness, color changes, or hair/nail changes Neurological: Denies dizziness, fainting, seizures, weakness, numbness, tingling, tremor Psychiatric - Denies nervousness, stress, depression or memory loss Endocrine: Denies heat or cold intolerance, sweating, frequent urination, excessive thirst, changes in appetite Hematologic: Denies ease of bruising or bleeding    Objective:     BP 130/88 mmHg  Pulse 74  Temp(Src) 98.2 F (36.8 C) (Oral)  Resp 18  Ht 5' 0.5" (1.537 m)  Wt 230 lb (104.327 kg)  BMI 44.16 kg/m2  SpO2 94%  LMP 12/31/1993 Nursing note and vital signs reviewed.  Physical Exam  Constitutional: She is oriented to person, place, and time. She appears well-developed and well-nourished.  HENT:  Head: Normocephalic.  Right Ear: Hearing, tympanic membrane, external ear and ear canal normal.  Left Ear: Hearing, tympanic membrane, external ear and ear canal normal.  Nose: Nose normal.  Mouth/Throat: Uvula is midline, oropharynx is clear and moist and mucous membranes are normal.  Eyes: Conjunctivae and EOM are normal. Pupils are equal, round, and reactive to light.  Neck: Neck supple. No JVD present. No tracheal deviation present. No thyromegaly present.  Cardiovascular: Normal rate, regular rhythm, normal heart sounds and intact distal pulses.   Pulmonary/Chest: Effort normal and breath sounds normal.  Abdominal: Soft. Bowel sounds are normal. She exhibits no distension and no mass. There is no tenderness. There is no rebound and no guarding.  Musculoskeletal: Normal range of motion. She exhibits no edema or tenderness.  Lymphadenopathy:    She has no cervical adenopathy.  Neurological: She is alert and oriented to person, place, and time. She has normal reflexes. No cranial nerve deficit. She exhibits normal muscle tone. Coordination normal.  Skin: Skin is warm and dry.  Psychiatric: She has a normal mood and affect. Her behavior is normal. Judgment and thought content  normal.       Assessment & Plan:   During the course of the visit the patient was educated and counseled about appropriate screening and preventive services including:    Pneumococcal vaccine   Influenza vaccine  Colorectal cancer screening  Glaucoma screening  Nutrition counseling   Diet review for nutrition referral? Yes ____  Not Indicated _X___   Patient Instructions (the written plan) was given to the patient.  Medicare Attestation I have personally reviewed: The patient's medical and social history Their use of alcohol, tobacco or illicit drugs Their current medications and supplements The patient's functional ability including ADLs,fall risks, home safety risks, cognitive, and hearing and visual impairment Diet and physical activities Evidence for depression or mood disorders  The patient's weight, height, BMI,  have been recorded in the chart.  I have made referrals, counseling, and provided education to the patient based on review of the above and I have provided the patient with a written personalized care plan for preventive services.     Mauricio Po, [place]   03/06/2016    Problem List Items Addressed This Visit      Endocrine   Hypothyroidism   Relevant Medications   levothyroxine (SYNTHROID, LEVOTHROID) 50 MCG tablet   Type 2 diabetes mellitus with retinopathy, without long-term current use of insulin ([place])     Other   Hyperlipidemia   Medicare annual wellness  visit, subsequent - Primary    Reviewed and updated patient's medical, surgical, family and social history. Medications and allergies were also reviewed. Basic screenings for depression, activities of daily living, hearing, cognition and safety were performed. Provider list was updated and health plan was provided to the patient.       Routine general medical examination at a health care facility    1) Anticipatory Guidance: Discussed importance of wearing a seatbelt while driving and not  texting while driving; changing batteries in smoke detector at least once annually; wearing suntan lotion when outside; eating a balanced and moderate diet; getting physical activity at least 30 minutes per day.  2) Immunizations / Screenings / Labs: All immunizations are up to date per recommendations. Due for a diabetic eye exam encouraged to be completed independently. Due for a dental and vision exam encouraged to be completed independently. All other screenings are up to date per recommendations. Obtain CBC, CMET, Lipid profile and TSH.   Overall good exam with several risk factors for cardiovascular disease including diabetes, hypertension, hyperlipidemia, sedentary lifestyle and obesity. Chronic conditions are currently managed with medication. Diabetes is under the management of endocrinology. Weight loss goal of 5-10% of current body weight through lifestyle changes. Increase physical activity to 30 minutes of moderate level activity daily. Encouraged nutritional intake that is balanced, varied and moderate and emphasizes nutrient dense foods and is low in saturated fat and sugary/processed foods. Continue other healthy lifestyle behaviors and choices. Follow up prevention exam in 1 year. Follow up office visit for chronic conditions pending blood work.       Relevant Orders   CBC (Completed)   Comprehensive metabolic panel (Completed)   TSH (Completed)   Lipid panel (Completed)

## 2016-03-06 NOTE — Patient Instructions (Signed)
Thank you for choosing [facility].  Summary/Instructions:  Your prescription(s) have been submitted to your pharmacy or been printed and provided for you. Please take as directed and contact our office if you believe you are having problem(s) with the medication(s) or have any questions.  Please stop by the lab on the basement level of the building for your blood work. Your results will be released to [facility] (or called to you) after review, usually within 72 hours after test completion. If any changes need to be made, you will be notified at that same time.  If your symptoms worsen or fail to improve, please contact our office for further instruction, or in case of emergency go directly to the emergency room at the closest medical facility.   Health Maintenance  Topic Date Due  . Hepatitis C Screening  [birth year]  . HIV Screening  09/[birth year]/1967  . FOOT EXAM  12/09/2014  . OPHTHALMOLOGY EXAM  07/29/2015  . INFLUENZA VACCINE  07/31/2016  . HEMOGLOBIN A1C  08/20/2016  . PNEUMOCOCCAL POLYSACCHARIDE VACCINE (2) 09/18/2016  . MAMMOGRAM  03/08/2017  . TETANUS/TDAP  09/18/2017  . COLONOSCOPY  11/23/2018  . ZOSTAVAX  Completed   Health Maintenance, Female Adopting a healthy lifestyle and getting preventive care can go a long way to promote health and wellness. Talk with your health care provider about what schedule of regular examinations is right for you. This is a good chance for you to check in with your provider about disease prevention and staying healthy. In between checkups, there are plenty of things you can do on your own. Experts have done a lot of research about which lifestyle changes and preventive measures are most likely to keep you healthy. Ask your health care provider for more information. WEIGHT AND DIET  Eat a healthy diet  Be sure to include plenty of vegetables, fruits, low-fat dairy products, and lean protein.  Do not eat a lot of foods high in solid fats, added  sugars, or salt.  Get regular exercise. This is one of the most important things you can do for your health.  Most adults should exercise for at least 150 minutes each week. The exercise should increase your heart rate and make you sweat (moderate-intensity exercise).  Most adults should also do strengthening exercises at least twice a week. This is in addition to the moderate-intensity exercise.  Maintain a healthy weight  Body mass index (BMI) is a measurement that can be used to identify possible weight problems. It estimates body fat based on height and weight. Your health care provider can help determine your BMI and help you achieve or maintain a healthy weight.  For females 76 years of age and older:   A BMI below 18.5 is considered underweight.  A BMI of 18.5 to 24.9 is normal.  A BMI of 25 to 29.9 is considered overweight.  A BMI of 30 and above is considered obese.  Watch levels of cholesterol and blood lipids  You should start having your blood tested for lipids and cholesterol at 64 years of age, then have this test every 5 years.  You may need to have your cholesterol levels checked more often if:  Your lipid or cholesterol levels are high.  You are older than 64 years of age.  You are at high risk for heart disease.  CANCER SCREENING   Lung Cancer  Lung cancer screening is recommended for adults 64-43 years old who are at high risk for lung cancer  because of a history of smoking.  A yearly low-dose CT scan of the lungs is recommended for people who:  Currently smoke.  Have quit within the past 15 years.  Have at least a 30-pack-year history of smoking. A pack year is smoking an average of one pack of cigarettes a day for 1 year.  Yearly screening should continue until it has been 15 years since you quit.  Yearly screening should stop if you develop a health problem that would prevent you from having lung cancer treatment.  Breast Cancer  Practice  breast self-awareness. This means understanding how your breasts normally appear and feel.  It also means doing regular breast self-exams. Let your health care provider know about any changes, no matter how small.  If you are in your 20s or 30s, you should have a clinical breast exam (CBE) by a health care provider every 1-3 years as part of a regular health exam.  If you are 27 or older, have a CBE every year. Also consider having a breast X-ray (mammogram) every year.  If you have a family history of breast cancer, talk to your health care provider about genetic screening.  If you are at high risk for breast cancer, talk to your health care provider about having an MRI and a mammogram every year.  Breast cancer gene (BRCA) assessment is recommended for women who have family members with BRCA-related cancers. BRCA-related cancers include:  Breast.  Ovarian.  Tubal.  Peritoneal cancers.  Results of the assessment will determine the need for genetic counseling and BRCA1 and BRCA2 testing. Cervical Cancer Your health care provider may recommend that you be screened regularly for cancer of the pelvic organs (ovaries, uterus, and vagina). This screening involves a pelvic examination, including checking for microscopic changes to the surface of your cervix (Pap test). You may be encouraged to have this screening done every 3 years, beginning at age 5.  For women ages 74-65, health care providers may recommend pelvic exams and Pap testing every 3 years, or they may recommend the Pap and pelvic exam, combined with testing for human papilloma virus (HPV), every 5 years. Some types of HPV increase your risk of cervical cancer. Testing for HPV may also be done on women of any age with unclear Pap test results.  Other health care providers may not recommend any screening for nonpregnant women who are considered low risk for pelvic cancer and who do not have symptoms. Ask your health care provider  if a screening pelvic exam is right for you.  If you have had past treatment for cervical cancer or a condition that could lead to cancer, you need Pap tests and screening for cancer for at least 20 years after your treatment. If Pap tests have been discontinued, your risk factors (such as having a new sexual partner) need to be reassessed to determine if screening should resume. Some women have medical problems that increase the chance of getting cervical cancer. In these cases, your health care provider may recommend more frequent screening and Pap tests. Colorectal Cancer  This type of cancer can be detected and often prevented.  Routine colorectal cancer screening usually begins at 64 years of age and continues through 64 years of age.  Your health care provider may recommend screening at an earlier age if you have risk factors for colon cancer.  Your health care provider may also recommend using home test kits to check for hidden blood in the stool.  A  small camera at the end of a tube can be used to examine your colon directly (sigmoidoscopy or colonoscopy). This is done to check for the earliest forms of colorectal cancer.  Routine screening usually begins at age 65.  Direct examination of the colon should be repeated every 5-10 years through 64 years of age. However, you may need to be screened more often if early forms of precancerous polyps or small growths are found. Skin Cancer  Check your skin from head to toe regularly.  Tell your health care provider about any new moles or changes in moles, especially if there is a change in a mole's shape or color.  Also tell your health care provider if you have a mole that is larger than the size of a pencil eraser.  Always use sunscreen. Apply sunscreen liberally and repeatedly throughout the day.  Protect yourself by wearing long sleeves, pants, a wide-brimmed hat, and sunglasses whenever you are outside. HEART DISEASE, DIABETES, AND  HIGH BLOOD PRESSURE   High blood pressure causes heart disease and increases the risk of stroke. High blood pressure is more likely to develop in:  People who have blood pressure in the high end of the normal range (130-139/85-89 mm Hg).  People who are overweight or obese.  People who are African American.  If you are 26-8 years of age, have your blood pressure checked every 3-5 years. If you are 40 years of age or older, have your blood pressure checked every year. You should have your blood pressure measured twice--once when you are at a hospital or clinic, and once when you are not at a hospital or clinic. Record the average of the two measurements. To check your blood pressure when you are not at a hospital or clinic, you can use:  An automated blood pressure machine at a pharmacy.  A home blood pressure monitor.  If you are between 39 years and 26 years old, ask your health care provider if you should take aspirin to prevent strokes.  Have regular diabetes screenings. This involves taking a blood sample to check your fasting blood sugar level.  If you are at a normal weight and have a low risk for diabetes, have this test once every three years after 64 years of age.  If you are overweight and have a high risk for diabetes, consider being tested at a younger age or more often. PREVENTING INFECTION  Hepatitis B  If you have a higher risk for hepatitis B, you should be screened for this virus. You are considered at high risk for hepatitis B if:  You were born in a country where hepatitis B is common. Ask your health care provider which countries are considered high risk.  Your parents were born in a high-risk country, and you have not been immunized against hepatitis B (hepatitis B vaccine).  You have HIV or AIDS.  You use needles to inject street drugs.  You live with someone who has hepatitis B.  You have had sex with someone who has hepatitis B.  You get hemodialysis  treatment.  You take certain medicines for conditions, including cancer, organ transplantation, and autoimmune conditions. Hepatitis C  Blood testing is recommended for:  Everyone born from [birth year] through 1963.  Anyone with known risk factors for hepatitis C. Sexually transmitted infections (STIs)  You should be screened for sexually transmitted infections (STIs) including gonorrhea and chlamydia if:  You are sexually active and are younger than 64 years of age.  You are older than 64 years of age and your health care provider tells you that you are at risk for this type of infection.  Your sexual activity has changed since you were last screened and you are at an increased risk for chlamydia or gonorrhea. Ask your health care provider if you are at risk.  If you do not have HIV, but are at risk, it may be recommended that you take a prescription medicine daily to prevent HIV infection. This is called pre-exposure prophylaxis (PrEP). You are considered at risk if:  You are sexually active and do not regularly use condoms or know the HIV status of your partner(s).  You take drugs by injection.  You are sexually active with a partner who has HIV. Talk with your health care provider about whether you are at high risk of being infected with HIV. If you choose to begin PrEP, you should first be tested for HIV. You should then be tested every 3 months for as long as you are taking PrEP.  PREGNANCY   If you are premenopausal and you may become pregnant, ask your health care provider about preconception counseling.  If you may become pregnant, take 400 to 800 micrograms (mcg) of folic acid every day.  If you want to prevent pregnancy, talk to your health care provider about birth control (contraception). OSTEOPOROSIS AND MENOPAUSE   Osteoporosis is a disease in which the bones lose minerals and strength with aging. This can result in serious bone fractures. Your risk for osteoporosis can  be identified using a bone density scan.  If you are 26 years of age or older, or if you are at risk for osteoporosis and fractures, ask your health care provider if you should be screened.  Ask your health care provider whether you should take a calcium or vitamin D supplement to lower your risk for osteoporosis.  Menopause may have certain physical symptoms and risks.  Hormone replacement therapy may reduce some of these symptoms and risks. Talk to your health care provider about whether hormone replacement therapy is right for you.  HOME CARE INSTRUCTIONS   Schedule regular health, dental, and eye exams.  Stay current with your immunizations.   Do not use any tobacco products including cigarettes, chewing tobacco, or electronic cigarettes.  If you are pregnant, do not drink alcohol.  If you are breastfeeding, limit how much and how often you drink alcohol.  Limit alcohol intake to no more than 1 drink per day for nonpregnant women. One drink equals 12 ounces of beer, 5 ounces of wine, or 1 ounces of hard liquor.  Do not use street drugs.  Do not share needles.  Ask your health care provider for help if you need support or information about quitting drugs.  Tell your health care provider if you often feel depressed.  Tell your health care provider if you have ever been abused or do not feel safe at home.   This information is not intended to replace advice given to you by your health care provider. Make sure you discuss any questions you have with your health care provider.   Document Released: 07/02/2011 Document Revised: 01/07/2015 Document Reviewed: 11/18/2013 Elsevier Interactive Patient Education 2016 [employer].  Diabetes and Exercise Exercising regularly is important. It is not just about losing weight. It has many health benefits, such as:  Improving your overall fitness, flexibility, and endurance.  Increasing your bone density.  Helping with weight  control.  Decreasing your  body fat.  Increasing your muscle strength.  Reducing stress and tension.  Improving your overall health. People with diabetes who exercise gain additional benefits because exercise:  Reduces appetite.  Improves the body's use of blood sugar (glucose).  Helps lower or control blood glucose.  Decreases blood pressure.  Helps control blood lipids (such as cholesterol and triglycerides).  Improves the body's use of the hormone insulin by:  Increasing the body's insulin sensitivity.  Reducing the body's insulin needs.  Decreases the risk for heart disease because exercising:  Lowers cholesterol and triglycerides levels.  Increases the levels of good cholesterol (such as high-density lipoproteins [HDL]) in the body.  Lowers blood glucose levels. YOUR ACTIVITY PLAN  Choose an activity that you enjoy, and set realistic goals. To exercise safely, you should begin practicing any new physical activity slowly, and gradually increase the intensity of the exercise over time. Your health care provider or diabetes educator can help create an activity plan that works for you. General recommendations include:  Encouraging children to engage in at least 60 minutes of physical activity each day.  Stretching and performing strength training exercises, such as yoga or weight lifting, at least 2 times per week.  Performing a total of at least 150 minutes of moderate-intensity exercise each week, such as brisk walking or water aerobics.  Exercising at least 3 days per week, making sure you allow no more than 2 consecutive days to pass without exercising.  Avoiding long periods of inactivity (90 minutes or more). When you have to spend an extended period of time sitting down, take frequent breaks to walk or stretch. RECOMMENDATIONS FOR EXERCISING WITH TYPE 1 OR TYPE 2 DIABETES   Check your blood glucose before exercising. If blood glucose levels are greater than 240  mg/dL, check for urine ketones. Do not exercise if ketones are present.  Avoid injecting insulin into areas of the body that are going to be exercised. For example, avoid injecting insulin into:  The arms when playing tennis.  The legs when jogging.  Keep a record of:  Food intake before and after you exercise.  Expected peak times of insulin action.  Blood glucose levels before and after you exercise.  The type and amount of exercise you have done.  Review your records with your health care provider. Your health care provider will help you to develop guidelines for adjusting food intake and insulin amounts before and after exercising.  If you take insulin or oral hypoglycemic agents, watch for signs and symptoms of hypoglycemia. They include:  Dizziness.  Shaking.  Sweating.  Chills.  Confusion.  Drink plenty of water while you exercise to prevent dehydration or heat stroke. Body water is lost during exercise and must be replaced.  Talk to your health care provider before starting an exercise program to make sure it is safe for you. Remember, almost any type of activity is better than none.   This information is not intended to replace advice given to you by your health care provider. Make sure you discuss any questions you have with your health care provider.   Document Released: 03/08/2004 Document Revised: 05/03/2015 Document Reviewed: 05/26/2013 Elsevier Interactive Patient Education [employer].

## 2016-03-06 NOTE — Assessment & Plan Note (Signed)
Reviewed and updated patient's medical, surgical, family and social history. Medications and allergies were also reviewed. Basic screenings for depression, activities of daily living, hearing, cognition and safety were performed. Provider list was updated and health plan was provided to the patient.  

## 2016-03-06 NOTE — Assessment & Plan Note (Signed)
1) Anticipatory Guidance: Discussed importance of wearing a seatbelt while driving and not texting while driving; changing batteries in smoke detector at least once annually; wearing suntan lotion when outside; eating a balanced and moderate diet; getting physical activity at least 30 minutes per day.  2) Immunizations / Screenings / Labs: All immunizations are up to date per recommendations. Due for a diabetic eye exam encouraged to be completed independently. Due for a dental and vision exam encouraged to be completed independently. All other screenings are up to date per recommendations. Obtain CBC, CMET, Lipid profile and TSH.   Overall good exam with several risk factors for cardiovascular disease including diabetes, hypertension, hyperlipidemia, sedentary lifestyle and obesity. Chronic conditions are currently managed with medication. Diabetes is under the management of endocrinology. Weight loss goal of 5-10% of current body weight through lifestyle changes. Increase physical activity to 30 minutes of moderate level activity daily. Encouraged nutritional intake that is balanced, varied and moderate and emphasizes nutrient dense foods and is low in saturated fat and sugary/processed foods. Continue other healthy lifestyle behaviors and choices. Follow up prevention exam in 1 year. Follow up office visit for chronic conditions pending blood work.

## 2016-03-08 ENCOUNTER — Encounter: Payer: Self-pay | Admitting: Family

## 2016-03-08 ENCOUNTER — Other Ambulatory Visit: Payer: Self-pay | Admitting: Family

## 2016-03-15 ENCOUNTER — Ambulatory Visit: Payer: Medicare Other | Admitting: Obstetrics and Gynecology

## 2016-03-30 ENCOUNTER — Other Ambulatory Visit: Payer: Self-pay | Admitting: Internal Medicine

## 2016-06-21 ENCOUNTER — Other Ambulatory Visit: Payer: Self-pay

## 2016-06-21 MED ORDER — IPRATROPIUM BROMIDE HFA 17 MCG/ACT IN AERS: INHALATION_SPRAY | RESPIRATORY_TRACT | Status: DC

## 2016-07-02 ENCOUNTER — Encounter: Payer: Self-pay | Admitting: Internal Medicine

## 2016-07-05 ENCOUNTER — Other Ambulatory Visit: Payer: Self-pay | Admitting: Family

## 2016-07-11 ENCOUNTER — Ambulatory Visit (INDEPENDENT_AMBULATORY_CARE_PROVIDER_SITE_OTHER): Payer: Medicare Other | Admitting: Family

## 2016-07-11 ENCOUNTER — Encounter: Payer: Self-pay | Admitting: Family

## 2016-07-11 VITALS — BP 124/84 | HR 80 | Temp 98.5°F | Resp 18 | Ht 60.5 in | Wt 228.0 lb

## 2016-07-11 DIAGNOSIS — R05 Cough: Secondary | ICD-10-CM

## 2016-07-11 DIAGNOSIS — R059 Cough, unspecified: Secondary | ICD-10-CM | POA: Insufficient documentation

## 2016-07-11 MED ORDER — FLUTICASONE FUROATE-VILANTEROL 100-25 MCG/INH IN AEPB: 1.0000 | INHALATION_SPRAY | Freq: Every day | RESPIRATORY_TRACT | Status: DC

## 2016-07-11 NOTE — Progress Notes (Addendum)
Subjective:    Patient ID: Meredith Hayden, female    DOB: [birth year], 66 y.o.   MRN: [id number]  Chief Complaint  Patient presents with  . Cough    has had a chest cold for 6 weeks, asthma has been bothering her, lingering cough, inhaler does not work    HPI:  Irania Hayden is a 66 y.o. female who  has a past medical history of Allergy; Anxiety; Asthma; Depression; Diabetes mellitus; Hyperlipidemia; Hypertension; IBS (irritable bowel syndrome); Thyroid disease; Arthritis; OSA (obstructive sleep apnea); Dyspareunia; Blood transfusion without reported diagnosis (2002); Fibroid; STD (sexually transmitted disease); Osteopenia (2009); Diabetes mellitus type 2 with retinopathy (Meredith Hayden); Diverticulitis; and GERD (gastroesophageal reflux disease). and presents today for an acute office visit.   This is a new problem. Associated symptoms of cough and shortness of breath with no fevers. Modifying factors include Robutussin and albuterol which has not helped very much. Symptoms are worsened when lying down and continues lie on 4 pillows. Course of the symptoms has generally improved. Denies recent antibiotic use.     Allergies  Allergen Reactions  . Oxycodone-Acetaminophen Anaphylaxis    Patient states that she can regular tylenol without any problems  . Codeine Phosphate     REACTION: unspecified  . Erythromycin Other (See Comments)    Dizzy and sweaty   . Morphine Sulfate Itching    Tolerated hydromorphone   . Oxycodone Hcl     REACTION: unspecified  . Sulfamethoxazole     REACTION: rash     Current Outpatient Prescriptions on File Prior to Visit  Medication Sig Dispense Refill  . ALPRAZolam (XANAX) 1 MG tablet Take 0.5-2 mg by mouth every 4 (four) hours as needed for anxiety or sleep. Take 1-2 tabs up to 6 times daily    . Ascorbic Acid (VITAMIN C) 1000 MG tablet Take 1,000 mg by mouth daily.    Marland Kitchen aspirin 81 MG tablet Take 81 mg by mouth daily.     Marland Kitchen atorvastatin  (LIPITOR) 10 MG tablet Take 1 tablet by mouth  daily 90 tablet 1  . Blood Glucose Monitoring Suppl (ONETOUCH VERIO) W/DEVICE KIT 1 each by Does not apply route daily. 1 kit 0  . Calcium Carbonate-Vit D-Min (CALCIUM 1200 PO) Take 1 tablet by mouth daily.    . cetirizine (ZYRTEC) 10 MG tablet Take 10 mg by mouth daily.     . Cholecalciferol (VITAMIN D3) 1000 UNITS CAPS Take 1 capsule by mouth daily.      . clobetasol ointment (TEMOVATE) 3.56 % Apply 1 application topically 2 (two) times daily. Apply as a thin layer to the affected areas. 60 g 1  . co-enzyme Q-10 30 MG capsule Take 30 mg by mouth daily.      . cyanocobalamin 100 MCG tablet Take 100 mcg by mouth daily.      Marland Kitchen desvenlafaxine (PRISTIQ) 100 MG 24 hr tablet Take 100 mg by mouth daily.      . Garlic 701 MG TABS Take 1 tablet by mouth daily.      . Insulin Pen Needle (EASY COMFORT PEN NEEDLES) 31G X 5 MM MISC Use 1x a day 100 each 3  . ipratropium (ATROVENT HFA) 17 MCG/ACT inhaler USE 2 PUFFS EVERY 4 HOURS AS NEEDED FOR WHEEZING. 12.9 g 0  . lamoTRIgine (LAMICTAL) 200 MG tablet Take 400 mg by mouth daily.    Marland Kitchen levothyroxine (SYNTHROID, LEVOTHROID) 50 MCG tablet Take 1 tablet by mouth  daily 90 tablet 3  .  lisinopril-hydrochlorothiazide (PRINZIDE,ZESTORETIC) 20-12.5 MG tablet Take 1 tablet by mouth  daily 90 tablet 0  . metFORMIN (GLUCOPHAGE-XR) 500 MG 24 hr tablet TAKE 2 TABLETS BY MOUTH 2  TIMES DAILY WITH A MEAL. 360 tablet 1  . metoprolol tartrate (LOPRESSOR) 25 MG tablet Take 1 tablet by mouth two  times daily 180 tablet 0  . Omega-3 350 MG CAPS Take 350 mg by mouth daily.      Marland Kitchen omeprazole (PRILOSEC) 20 MG capsule Take 20 mg by mouth daily.    [name] DELICA LANCETS 53Z MISC USE AS DIRECTED TO CHECK BLOOD SUGAR TWICE DAILY. 100 each 11  . ONETOUCH VERIO test strip CHECK BLOOD SUGAR TWICE DAILY. 100 each 11  . zolpidem (AMBIEN) 10 MG tablet Take 10 mg by mouth at bedtime as needed for sleep.    . [DISCONTINUED] azelastine  (ASTELIN) 137 MCG/SPRAY nasal spray 1 spray by Nasal route 2 (two) times daily. Use in each nostril as directed     . [DISCONTINUED] budesonide-formoterol (SYMBICORT) 160-4.5 MCG/ACT inhaler Inhale 2 puffs into the lungs 2 (two) times daily.      . [DISCONTINUED] diphenhydrAMINE (SOMINEX) 25 MG tablet Take 25 mg by mouth at bedtime as needed.      . [DISCONTINUED] fluticasone (FLONASE) 50 MCG/ACT nasal spray 2 sprays by Nasal route daily.      . [DISCONTINUED] temazepam (RESTORIL) 30 MG capsule Take 30 mg by mouth at bedtime as needed.     No current facility-administered medications on file prior to visit.    Review of Systems  Constitutional: Negative for fever and chills.  HENT: Negative for congestion, ear pain, sinus pressure and sore throat.   Respiratory: Positive for cough. Negative for chest tightness, shortness of breath and wheezing.   Neurological: Negative for headaches.      Objective:    BP 124/84 mmHg  Pulse 80  Temp(Src) 98.5 F (36.9 C) (Oral)  Resp 18  Ht 5' 0.5" (1.537 m)  Wt 228 lb (103.42 kg)  BMI 43.78 kg/m2  SpO2 93%  LMP 12/31/1993 Nursing note and vital signs reviewed.  Physical Exam  Constitutional: She is oriented to person, place, and time. She appears well-developed and well-nourished. No distress.  HENT:  Right Ear: Hearing, tympanic membrane, external ear and ear canal normal.  Left Ear: Hearing, tympanic membrane, external ear and ear canal normal.  Nose: Right sinus exhibits no maxillary sinus tenderness and no frontal sinus tenderness. Left sinus exhibits no maxillary sinus tenderness and no frontal sinus tenderness.  Mouth/Throat: Uvula is midline, oropharynx is clear and moist and mucous membranes are normal.  Neck: Neck supple.  Cardiovascular: Normal rate, regular rhythm, normal heart sounds and intact distal pulses.   Pulmonary/Chest: Effort normal and breath sounds normal.  Lymphadenopathy:    She has no cervical adenopathy.    Neurological: She is alert and oriented to person, place, and time.  Skin: Skin is warm and dry.  Psychiatric: She has a normal mood and affect. Her behavior is normal. Judgment and thought content normal.       Assessment & Plan:   Problem List Items Addressed This Visit      Other   Cough - Primary    Symptoms and exam consistent with resolving asthmatic bronchitis most likely viral with residual cough. Cannot rule out reactive airway disease or gastroesophageal reflux. Treat conservatively with over the counter medications as needed for symptom relief. Continue current dosage of omeprazole. Start Breo as needed for  inflammation. Follow up if symptoms worsen or do not improve.       Relevant Medications   fluticasone furoate-vilanterol (BREO ELLIPTA) 100-25 MCG/INH AEPB       Meredith am having Ms. Loudenslager start on fluticasone furoate-vilanterol. Meredith am also having her maintain her ALPRAZolam, aspirin, Omega-3, desvenlafaxine, cetirizine, co-enzyme Q-10, cyanocobalamin, Vitamin D3, Garlic, vitamin C, Calcium Carbonate-Vit D-Min (CALCIUM 1200 PO), ONETOUCH VERIO, omeprazole, Insulin Pen Needle, lamoTRIgine, clobetasol ointment, ONETOUCH DELICA LANCETS 60Y, ONETOUCH VERIO, zolpidem, levothyroxine, metFORMIN, atorvastatin, ipratropium, metoprolol tartrate, and lisinopril-hydrochlorothiazide.   Meds ordered this encounter  Medications  . fluticasone furoate-vilanterol (BREO ELLIPTA) 100-25 MCG/INH AEPB    Sig: Inhale 1 puff into the lungs daily.    Dispense:  28 each    Refill:  0    Order Specific Question:  Supervising Provider    Answer:  Pricilla Holm A [[id number]]     Follow-up: Return if symptoms worsen or fail to improve.  Mauricio Po, FNP

## 2016-07-11 NOTE — Assessment & Plan Note (Signed)
Symptoms and exam consistent with resolving asthmatic bronchitis most likely viral with residual cough. Cannot rule out reactive airway disease or gastroesophageal reflux. Treat conservatively with over the counter medications as needed for symptom relief. Continue current dosage of omeprazole. Start Breo as needed for inflammation. Follow up if symptoms worsen or do not improve.

## 2016-07-11 NOTE — Patient Instructions (Signed)
Thank you for choosing [facility].  Summary/Instructions:  Please take the [place] daily.  Your prescription(s) have been submitted to your pharmacy or been printed and provided for you. Please take as directed and contact our office if you believe you are having problem(s) with the medication(s) or have any questions.  If your symptoms worsen or fail to improve, please contact our office for further instruction, or in case of emergency go directly to the emergency room at the closest medical facility.    Acute Bronchitis Bronchitis is inflammation of the airways that extend from the windpipe into the lungs (bronchi). The inflammation often causes mucus to develop. This leads to a cough, which is the most common symptom of bronchitis.  In acute bronchitis, the condition usually develops suddenly and goes away over time, usually in a couple weeks. Smoking, allergies, and asthma can make bronchitis worse. Repeated episodes of bronchitis may cause further lung problems.  CAUSES Acute bronchitis is most often caused by the same virus that causes a cold. The virus can spread from person to person (contagious) through coughing, sneezing, and touching contaminated objects. SIGNS AND SYMPTOMS   Cough.   Fever.   Coughing up mucus.   Body aches.   Chest congestion.   Chills.   Shortness of breath.   Sore throat.  DIAGNOSIS  Acute bronchitis is usually diagnosed through a physical exam. Your health care provider will also ask you questions about your medical history. Tests, such as chest X-rays, are sometimes done to rule out other conditions.  TREATMENT  Acute bronchitis usually goes away in a couple weeks. Oftentimes, no medical treatment is necessary. Medicines are sometimes given for relief of fever or cough. Antibiotic medicines are usually not needed but may be prescribed in certain situations. In some cases, an inhaler may be recommended to help reduce shortness of  breath and control the cough. A cool mist vaporizer may also be used to help thin bronchial secretions and make it easier to clear the chest.  HOME CARE INSTRUCTIONS  Get plenty of rest.   Drink enough fluids to keep your urine clear or pale yellow (unless you have a medical condition that requires fluid restriction). Increasing fluids may help thin your respiratory secretions (sputum) and reduce chest congestion, and it will prevent dehydration.   Take medicines only as directed by your health care provider.  If you were prescribed an antibiotic medicine, finish it all even if you start to feel better.  Avoid smoking and secondhand smoke. Exposure to cigarette smoke or irritating chemicals will make bronchitis worse. If you are a smoker, consider using nicotine gum or skin patches to help control withdrawal symptoms. Quitting smoking will help your lungs heal faster.   Reduce the chances of another bout of acute bronchitis by washing your hands frequently, avoiding people with cold symptoms, and trying not to touch your hands to your mouth, nose, or eyes.   Keep all follow-up visits as directed by your health care provider.  SEEK MEDICAL CARE IF: Your symptoms do not improve after 1 week of treatment.  SEEK IMMEDIATE MEDICAL CARE IF:  You develop an increased fever or chills.   You have chest pain.   You have severe shortness of breath.  You have bloody sputum.   You develop dehydration.  You faint or repeatedly feel like you are going to pass out.  You develop repeated vomiting.  You develop a severe headache. MAKE SURE YOU:   Understand these instructions.  Will watch your condition.  Will get help right away if you are not doing well or get worse.   This information is not intended to replace advice given to you by your health care provider. Make sure you discuss any questions you have with your health care provider.   Document Released: 01/24/2005 Document  Revised: 01/07/2015 Document Reviewed: 06/09/2013 Elsevier Interactive Patient Education [employer].

## 2016-08-10 ENCOUNTER — Telehealth: Payer: Self-pay | Admitting: Family

## 2016-08-10 NOTE — Telephone Encounter (Signed)
Patient Name: Meredith Hayden  DOB: [birth year]    Initial Comment Caller states she can't keep her balance and she has fallen about five times.    Nurse Assessment  Nurse: Verlin Fester RN, Stanton Kidney Date/Time Eilene Ghazi Time): 08/10/2016 12:27:10 PM  Confirm and document reason for call. If symptomatic, describe symptoms. You must click the next button to save text entered. ---Patient states she can't keep her balance and she has fallen about five times.  Has the patient traveled out of the country within the last 30 days? ---No  Does the patient have any new or worsening symptoms? ---Yes  Will a triage be completed? ---Yes  Related visit to physician within the last 2 weeks? ---No  Does the PT have any chronic conditions? (i.e. diabetes, asthma, etc.) ---Yes  List chronic conditions. ---"HTN, thyroid, emotional issues, IBS, diabetes, hx SVT, asthma"  Is this a behavioral health or substance abuse call? ---No     Guidelines    Guideline Title Affirmed Question Affirmed Notes  Dizziness - Vertigo [1] MODERATE dizziness (e.g., vertigo; feels very unsteady, interferes with normal activities) AND [2] has NOT been evaluated by physician for this    Final Disposition User   See Physician within [place], RN, Stanton Kidney    Comments  After triage offered appointment and caller refused, states if it gets worse I will go to [place] REFUSED   Disagree/Comply: Disagree  Disagree/Comply Reason: Disagree with instructions

## 2016-09-10 ENCOUNTER — Encounter: Payer: Self-pay | Admitting: Internal Medicine

## 2016-09-10 ENCOUNTER — Ambulatory Visit (INDEPENDENT_AMBULATORY_CARE_PROVIDER_SITE_OTHER): Payer: Medicare Other | Admitting: Internal Medicine

## 2016-09-10 VITALS — BP 122/88 | HR 68 | Ht 65.0 in | Wt 225.0 lb

## 2016-09-10 DIAGNOSIS — K5909 Other constipation: Secondary | ICD-10-CM

## 2016-09-10 DIAGNOSIS — K219 Gastro-esophageal reflux disease without esophagitis: Secondary | ICD-10-CM | POA: Diagnosis not present

## 2016-09-10 DIAGNOSIS — K589 Irritable bowel syndrome without diarrhea: Secondary | ICD-10-CM | POA: Diagnosis not present

## 2016-09-10 DIAGNOSIS — R197 Diarrhea, unspecified: Secondary | ICD-10-CM

## 2016-09-10 DIAGNOSIS — R1310 Dysphagia, unspecified: Secondary | ICD-10-CM

## 2016-09-10 MED ORDER — NA SULFATE-K SULFATE-MG SULF 17.5-3.13-1.6 GM/177ML PO SOLN: 1.0000 | Freq: Once | ORAL | 0 refills | Status: AC

## 2016-09-10 NOTE — Patient Instructions (Signed)
You have been scheduled for an endoscopy and colonoscopy. Please follow the written instructions given to you at your visit today. Please pick up your prep supplies at the pharmacy within the next 1-3 days. If you use inhalers (even only as needed), please bring them with you on the day of your procedure.  

## 2016-09-10 NOTE — Progress Notes (Signed)
HISTORY OF PRESENT ILLNESS:  Meredith Hayden is a 66 y.o. 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 First, she reports problems with indigestion and heartburn which are suboptimally controlled with omeprazole 20 mg daily. She will take antacids for breakthrough symptoms. This helps. She also reports occasional intermittent solid food dysphagia which is mild. She has not had prior upper endoscopy. Her weight has been stable with her current BMI 37.4. She also reports problems with diarrhea predominant irritable bowel syndrome since her 81s. Has seen several other gastroenterologists. Now she reports a component that alternates with constipation. At least one half the time, diarrhea. She does take metformin. She did have routine colonoscopy November 2006. Evidence for prior sigmoid colon resection secondary to diverticular disease. She did have a diminutive hyperplastic polyp removed. Follow-up in 10 years recommended.  REVIEW OF SYSTEMS:  All non-GI ROS negative except for sinus allergy, anxiety, arthritis, depression, headaches, heart rhythm change, skin rash  Past Medical History:  Diagnosis Date  . Allergy   . Anxiety   . Arthritis   . Asthma   . Blood transfusion without reported diagnosis 2002   with knee replacement  . Depression   . Diabetes mellitus   . Diabetes mellitus type 2 with retinopathy ([place])   . Diverticulitis   . Dyspareunia    vaginal dryness  . Fibroid    reason for hysterectomy  . GERD (gastroesophageal reflux disease)   . Hyperlipidemia   . Hypertension   . IBS (irritable bowel syndrome)   . OSA (obstructive sleep apnea)   . Osteopenia 2009  . STD (sexually transmitted disease)    Hx HPV  . Thyroid disease     Past Surgical History:  Procedure Laterality Date  . ABDOMINAL HYSTERECTOMY  1995   TAH/LSO--Dr. Ubaldo Glassing   . [place]  . FOOT SURGERY Bilateral   . NASAL SINUS SURGERY    . OOPHORECTOMY    . PARTIAL COLECTOMY     -RSO  . ROTATOR CUFF REPAIR Left   . TOTAL KNEE ARTHROPLASTY Bilateral 2002, 2004    Social History Miela Broman  reports that she has never smoked. She has never used smokeless tobacco. She reports that she drinks alcohol. She reports that she does not use drugs.  family history includes COPD in her mother; Diabetes in her brother, maternal aunt, mother, sister, and sister; Heart attack (age of onset: 93) in her sister; Heart attack (age of onset: 34) in her father; Hypertension in her father, sister, and sister; Liver cancer in her maternal grandmother; Ovarian cancer in her maternal aunt; Stomach cancer in her maternal grandmother.  Allergies  Allergen Reactions  . Oxycodone-Acetaminophen Anaphylaxis    Patient states that she can regular tylenol without any problems  . Codeine Phosphate     REACTION: unspecified  . Erythromycin Other (See Comments)    Dizzy and sweaty   . Morphine Sulfate Itching    Tolerated hydromorphone   . Oxycodone Hcl     REACTION: unspecified  . Sulfamethoxazole     REACTION: rash  . Tylenol [Acetaminophen] Other (See Comments)    Fatty liver       PHYSICAL EXAMINATION: Vital signs: BP 122/88 (BP Location: Left Arm, Patient Position: Sitting, Cuff Size: Large)   Pulse 68   Ht 5\' 5"  (1.651 m)   Wt 225 lb (  102.1 kg)   LMP 12/31/1993   BMI 37.44 kg/m   Constitutional: generally well-appearing, Obese, no acute distress Psychiatric: alert and oriented x3, cooperative Eyes: extraocular movements intact, anicteric, conjunctiva pink Mouth: oral pharynx moist, no lesions Neck: supple without thyromegaly Lymph: no lymphadenopathy Cardiovascular: heart regular rate and rhythm, no murmur Lungs: clear to auscultation bilaterally Abdomen: soft, obese, nontender, nondistended, no obvious ascites, no peritoneal signs, normal  bowel sounds, no organomegaly Rectal: Deferred until colonoscopy Extremities: no clubbing cyanosis or lower extremity edema bilaterally Skin: no lesions on visible extremities Neuro: No focal deficits. Cranial nerves intact  ASSESSMENT:  #1. GERD. Deteriorated. Symptoms despite low-dose PPI #2. Intermittent solid food dysphagia. Rule out peptic stricture #3. Obesity #4. Irritable bowel syndrome with diarrhea predominance #5. History of diverticulitis status post sigmoid colon resection #6. Colon cancer screening. Negative exam November 2006   PLAN:  #1. Reflux precautions with attention to weight loss #2. Continue PPI. Antacids for breakthrough symptoms. Could increase omeprazole to 40 mg daily if an acids required frequently #3. Upper endoscopy with possible esophageal dilation.The nature of the procedure, as well as the risks, benefits, and alternatives were carefully and thoroughly reviewed with the patient. Ample time for discussion and questions allowed. The patient understood, was satisfied, and agreed to proceed. #4. Screening colonoscopy.The nature of the procedure, as well as the risks, benefits, and alternatives were carefully and thoroughly reviewed with the patient. Ample time for discussion and questions allowed. The patient understood, was satisfied, and agreed to proceed. #5. High fiber diet and/or fiber supplementation #6. Hold diabetic medications the day of the examination to avoid hypoglycemia  A copy of this consultation note has been sent to Mauricio Po FNP

## 2016-09-15 ENCOUNTER — Other Ambulatory Visit: Payer: Self-pay | Admitting: Family

## 2016-09-15 ENCOUNTER — Other Ambulatory Visit: Payer: Self-pay | Admitting: Internal Medicine

## 2016-09-17 ENCOUNTER — Other Ambulatory Visit: Payer: Self-pay

## 2016-09-17 MED ORDER — METFORMIN HCL ER 500 MG PO TB24: ORAL_TABLET | ORAL | 1 refills | Status: DC

## 2016-09-17 MED ORDER — ATORVASTATIN CALCIUM 10 MG PO TABS: 10.0000 mg | ORAL_TABLET | Freq: Every day | ORAL | 1 refills | Status: DC

## 2016-09-27 ENCOUNTER — Other Ambulatory Visit: Payer: Self-pay | Admitting: Family

## 2016-10-11 ENCOUNTER — Telehealth: Payer: Self-pay | Admitting: Obstetrics and Gynecology

## 2016-10-11 NOTE — Telephone Encounter (Signed)
Spoke with patient. Patient states she has "worsening" litchen sclerosis. Patient states that is has been getting worse over the last month. Patient reports vaginal itching and "little black areas the size of black eyed peas". Patient reports a "yellow, serous" drainage from "vaginal lesions". Patient denies any odor. Patient reports that she has been using temovate ointment, but only has a little left. Advised patient she would need to come in to office for further evaluation. Patient scheduled for 10/12/16 with Dr. Quincy Simmonds at 3:30pm. Patient verbalizes understanding and is agreeable. Last OV 03/30/15.    Routing to provider for final review. Patient is agreeable to disposition. Will close encounter.

## 2016-10-11 NOTE — Telephone Encounter (Signed)
Patient says she has a rash in her Vaginal area.  Last Seen 03/30/2015

## 2016-10-12 ENCOUNTER — Encounter: Payer: Self-pay | Admitting: Obstetrics and Gynecology

## 2016-10-12 ENCOUNTER — Ambulatory Visit (INDEPENDENT_AMBULATORY_CARE_PROVIDER_SITE_OTHER): Payer: Medicare Other | Admitting: Obstetrics and Gynecology

## 2016-10-12 ENCOUNTER — Ambulatory Visit (INDEPENDENT_AMBULATORY_CARE_PROVIDER_SITE_OTHER): Payer: Medicare Other | Admitting: Internal Medicine

## 2016-10-12 ENCOUNTER — Encounter: Payer: Self-pay | Admitting: Internal Medicine

## 2016-10-12 VITALS — BP 126/84 | HR 62 | Ht 61.5 in | Wt 219.0 lb

## 2016-10-12 VITALS — BP 125/67 | HR 69 | Ht 61.5 in | Wt 219.0 lb

## 2016-10-12 DIAGNOSIS — E039 Hypothyroidism, unspecified: Secondary | ICD-10-CM | POA: Diagnosis not present

## 2016-10-12 DIAGNOSIS — N763 Subacute and chronic vulvitis: Secondary | ICD-10-CM

## 2016-10-12 DIAGNOSIS — Z119 Encounter for screening for infectious and parasitic diseases, unspecified: Secondary | ICD-10-CM

## 2016-10-12 DIAGNOSIS — Z23 Encounter for immunization: Secondary | ICD-10-CM

## 2016-10-12 DIAGNOSIS — E11319 Type 2 diabetes mellitus with unspecified diabetic retinopathy without macular edema: Secondary | ICD-10-CM

## 2016-10-12 LAB — POCT GLYCOSYLATED HEMOGLOBIN (HGB A1C): HEMOGLOBIN A1C: 7

## 2016-10-12 MED ORDER — TERCONAZOLE 0.4 % VA CREA: 1.0000 | TOPICAL_CREAM | Freq: Every day | VAGINAL | 0 refills | Status: DC

## 2016-10-12 MED ORDER — NYSTATIN 100000 UNIT/GM EX OINT: 1.0000 "application " | TOPICAL_OINTMENT | Freq: Two times a day (BID) | CUTANEOUS | 0 refills | Status: DC

## 2016-10-12 MED ORDER — CLOBETASOL PROPIONATE 0.05 % EX OINT: 1.0000 "application " | TOPICAL_OINTMENT | Freq: Two times a day (BID) | CUTANEOUS | 1 refills | Status: DC

## 2016-10-12 NOTE — Progress Notes (Signed)
Patient ID: Meredith Hayden, female   DOB: 11-14-1950, 66 y.o.   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 Last visit 2.5 mo ago.   She has severe depression >> can impact her DM control. She had severe depression this summer >> did not check sugars, ate what she wanted.  Before last visit, she was telling me that she started a plant based diet with no complex carbs (except cottage cheese and an occasional latte) + 2x a week meat - Excell Seltzer nutrarian diet  (specifically for DM). She is now off the diet and will go back on it for 6 weeks for [place].  DM2: Last hemoglobin A1c was: Lab Results  Component Value Date   HGBA1C 7.1 02/21/2016   HGBA1C 6.3 09/30/2015   HGBA1C 7.0 (H) 04/28/2015   Pt is on a regimen of: - Metformin 1000 mg 2x a day >> abd. distention and flatulence >> now on Metformin XR 1000 mg 2x a day  She was Glipizide XL 10 mg in am - added 07/23/2014 She was on Metformin before >> N/V/D/AP She tried Glyburide >> no SEs She tried Januvia. He was on Lantus 20 >> 30 >> 20 >> 15 >> 20 units hs (pens) - off now  Pt checks sugars 0-1x a day: - before b'fast: 129-168 >> 158-213, 286 >> 101-169 >> 87-125, 144 >> n/c >> 160-198 >> 139 - 2h after b'fast: when we started tx: 195-225 >> 202 (after gummies) >> n/c - lunch: 125 >> 250-265 >> 128 >> 101-141 >> n/c >> 110-137, 151 >> n/c - 2h after lunch: 170 >> n/c >> 122 >> n/c >> 209 >> n/c - dinner: 161 >> 203-231 >> 99 >> n/c >> 126 >> 151-176 >> n/c - 2h after dinner: 194 >> n/c >> n/c >> 88-140 >> n/c >> 174-236 >> 159 - bedtime: n/c >> n/c >> 142 >> n/c - nighttime: 83 (after this, she decreased the Lantus to 25 units) >> 109-129 >> n/c No lows. Lowest sugar was 139; she has hypoglycemia awareness at 80.  Highest sugar was 159  Pt's meals are - no  regular pattern - she wakes up at different times of the day: - Breakfast: cottage cheese + fruit + almonds - Lunch: apple + almond butter - Dinner: biggest meal of the day: meat + veggies ; sometimes just beans + kale + mushrooms - Snacks: anything; several times a day: almonds, sandwich, crackers  - She has CKD, last BUN/creatinine:  Lab Results  Component Value Date   BUN 17 03/06/2016   CREATININE 1.15 03/06/2016  On Lisinopril. - last set of lipids: Lab Results  Component Value Date   CHOL 162 03/06/2016   HDL 46.40 03/06/2016   LDLCALC 79 03/06/2016   LDLDIRECT 77.1 11/02/2013   TRIG 179.0 (H) 03/06/2016   CHOLHDL 3 03/06/2016  On Lipitor. On Omega 3 FA. 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  - no numbness and tingling in her feet.  Hypothyroidism: - dx several years ago - on LT4 50 mcg daily as she wakes up - missed doses 2x a week - takes calcium and Protonix later in the day  - last TSH: Lab Results  Component Value Date   TSH 3.06 03/06/2016   TSH 2.65 04/28/2015   TSH 1.655 01/25/2015  TSH 1.610 12/09/2014   TSH 3.11 11/02/2013   FREET4 0.70 04/28/2015   FREET4 0.90 12/09/2014   She is seeing Dr Toy Care >> referred to a psychiatric neurologist [facility]) for ECT, which was planned 11/2014, but her EKG was abnormal >> PSVT (prolonged QT), she was admitted for PSVT on 01/25/2015. On Lopressor.   She also has a fatty liver.  I reviewed pt's medications, allergies, PMH, social hx, family hx, and changes were documented in the history of present illness. Otherwise, unchanged from my initial visit note.  ROS: Constitutional: + weight loss, no fatigue, no subjective hyperthermia/hypothermia Eyes: no blurry vision, no xerophthalmia ENT: no sore throat, no nodules palpated in throat, no dysphagia/odynophagia, no hoarseness Cardiovascular: no CP/SOB/palpitations/no leg swelling Respiratory: no cough (URI)/no SOB Gastrointestinal: no  N/V/D/C Musculoskeletal: no muscle/joint aches Skin: no rash, + hair loss Neurological: no tremors/numbness/tingling/dizziness  PE: BP 125/67   Pulse 69   Ht 5' 1.5" (1.562 m)   Wt 219 lb (99.3 kg)   LMP 12/31/1993   SpO2 95%   BMI 40.71 kg/m  Body mass index is 40.71 kg/m.  Wt Readings from Last 3 Encounters:  10/12/16 219 lb (99.3 kg)  09/10/16 225 lb (102.1 kg)  07/11/16 228 lb (103.4 kg)   Constitutional: overweight, in NAD Eyes: PERRLA, EOMI, no exophthalmos ENT: moist mucous membranes, no thyromegaly, no cervical lymphadenopathy Cardiovascular: RRR, No MRG Respiratory: CTA B Gastrointestinal: abdomen soft, NT, ND, BS+ Musculoskeletal: no deformities, strength intact in all 4 Skin: moist, warm, no rashes Neurological: no tremor with outstretched hands, DTR normal in all 4  ASSESSMENT: 1. 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 Hypothyroidism  Pt's care team: - cardiology: Dr Johnsie Cancel - psychiatry: Dr Toy Care - orthopedics: Dr Percell Miller - GI: Dr Sharlett Iles - Gyn: Dr Quincy Simmonds - Dermatology: Dr [place] - Podiatry: Dr Blenda Mounts  PLAN:  1. Patient with long-standing, uncontrolled diabetes, on metformin only now (was able to come insulin with a nutritarian diet!), with her control affected by her severe depression and poor diet.  She again did not check sugars consistently since last visit, only has 2 values which are close to goal. - I advised her to: Patient Instructions  Please continue Metformin ER 1000 mg 2x day.  Please check sugars once daily.  Please come back for a follow-up appointment in 3 months - with your log.  - start checking sugars at different times of the day - check 1-2 times a day, rotating checks - not up to date with eye exams >> scheduled - will give flu shot - per her request, will give the high dose one - HbA1c today: 7.0% - Return to clinic in 1.5 mo with sugar log  2. Hypothyroidism - reviewed latest  TFTs with pt (04/2015) >> normal - she still has poor compliance with the LT4 50 mcg >> again strongly advised her to take it every day! - she takes the thyroid hormone with water, >30 minutes before breakfast, separated by >4 hours from acid reflux medications, calcium, iron, multivitamins. - will recheck TFTs at next visit  Philemon Kingdom, MD PhD [facility] Endocrinology'

## 2016-10-12 NOTE — Progress Notes (Signed)
Patient ID: Meredith Hayden, female   DOB: Mar 11, 1950, 66 y.o.   MRN: [id number]  GYNECOLOGY  VISIT   HPI: 66 y.o.   Married  Caucasian  female   G2P2002 with Patient's last menstrual period was 12/31/1993.   here to evaluate lichen sclerosis.   Brings in a picture of her vulva she took with her cell phone.  Extensive ecchymoses and erythema noted. States symptoms for 2 - 3 months.  Itching and yellow drainage.   No odor. Scratching causes increased discomfort.    Hurts to sit and void when the urine hits her vulva. Wearing soft underwear.  Leaking urine but cannot use a pad due to irritation.  States she did get improvement in the past from the clobetasol.  Is using the clobetasol twice a day for a week but has very little left.  No urinary pain.  Has diabetes. A1C 7.1.  Wants testing for Hep C. Had HIV testing was negative.   Is a former Marine scientist.  Cannot take Diflucan due to cardiac arrhythmia.  GYNECOLOGIC HISTORY: Patient's last menstrual period was 12/31/1993. Contraception:  hysterectomy Menopausal hormone therapy:  none Last mammogram: 03/09/15, Density Category B, Bi-Rads 1: Negative Last pap smear: 2009, Negtive        OB History    Gravida Para Term Preterm AB Living   _0 0 0 2   SAB TAB Ectopic Multiple Live Births   0 0 0 0 2         Patient Active Problem List   Diagnosis Date Noted  . Cough 07/11/2016  . Medicare annual wellness visit, subsequent 03/06/2016  . Routine general medical examination at a health care facility 03/06/2016  . Type 2 diabetes mellitus with retinopathy, without long-term current use of insulin ([place]) 02/21/2016  . Fatty liver disease, nonalcoholic 90/30/0923  . Atypical chest pain 02/21/2015  . PSVT (paroxysmal supraventricular tachycardia) ([place])   . IRRITABLE BOWEL SYNDROME 03/05/2008  . DISORDER, BIPOLAR NEC 09/19/2007  . SLEEP APNEA 09/19/2007  . Hypothyroidism 08/26/2007  . OSTEOARTHRITIS 08/26/2007  .  Hyperlipidemia 10/31/2006  . OBESITY, MORBID 10/31/2006  . ANXIETY 10/31/2006  . Depression 10/31/2006  . Essential hypertension 10/31/2006  . ALLERGIC RHINITIS 10/31/2006  . ASTHMA 10/31/2006  . DIVERTICULOSIS, COLON 10/31/2006  . COLONIC POLYPS 11/29/2005    Past Medical History:  Diagnosis Date  . Allergy   . Anxiety   . Arthritis   . Asthma   . Blood transfusion without reported diagnosis 2002   with knee replacement  . Depression   . Diabetes mellitus   . Diabetes mellitus type 2 with retinopathy ([place])   . Diverticulitis   . Dyspareunia    vaginal dryness  . Fibroid    reason for hysterectomy  . GERD (gastroesophageal reflux disease)   . Hyperlipidemia   . Hypertension   . IBS (irritable bowel syndrome)   . OSA (obstructive sleep apnea)   . Osteopenia 2009  . STD (sexually transmitted disease)    Hx HPV  . Thyroid disease     Past Surgical History:  Procedure Laterality Date  . ABDOMINAL HYSTERECTOMY  1995   TAH/LSO--Dr. Ubaldo Glassing  . [place]  . FOOT SURGERY Bilateral   . NASAL SINUS SURGERY    . OOPHORECTOMY    . PARTIAL COLECTOMY     -RSO  . ROTATOR CUFF REPAIR Left   . TOTAL KNEE ARTHROPLASTY Bilateral 2002, 2004    Current Outpatient Prescriptions  Medication  Sig Dispense Refill  . ALPRAZolam (XANAX) 1 MG tablet Take 0.5-2 mg by mouth every 4 (four) hours as needed for anxiety or sleep. Take 1-2 tabs up to 6 times daily    . Ascorbic Acid (VITAMIN C) 1000 MG tablet Take 1,000 mg by mouth daily.    Marland Kitchen aspirin 81 MG tablet Take 81 mg by mouth daily.     Marland Kitchen atorvastatin (LIPITOR) 10 MG tablet Take 1 tablet (10 mg total) by mouth daily. 30 tablet 1  . ATROVENT HFA 17 MCG/ACT inhaler USE 2 PUFFS EVERY 4 HOURS  AS NEEDED FOR WHEEZING. 12.9 g 0  . Blood Glucose Monitoring Suppl (ONETOUCH VERIO) W/DEVICE KIT 1 each by Does not apply route daily. 1 kit 0  . Calcium Carbonate-Vit D-Min (CALCIUM 1200 PO) Take 1 tablet by mouth daily.    . cetirizine  (ZYRTEC) 10 MG tablet Take 10 mg by mouth daily.     . Cholecalciferol (VITAMIN D3) 1000 UNITS CAPS Take 1 capsule by mouth daily.      . clobetasol ointment (TEMOVATE) 2.42 % Apply 1 application topically 2 (two) times daily. Apply as a thin layer to the affected areas. 60 g 1  . co-enzyme Q-10 30 MG capsule Take 30 mg by mouth daily.      . cyanocobalamin 100 MCG tablet Take 100 mcg by mouth daily.      Marland Kitchen desvenlafaxine (PRISTIQ) 100 MG 24 hr tablet Take 100 mg by mouth daily.      . fluticasone furoate-vilanterol (BREO ELLIPTA) 100-25 MCG/INH AEPB Inhale 1 puff into the lungs daily. 28 each 0  . Garlic 353 MG TABS Take 1 tablet by mouth daily.      . Insulin Pen Needle (EASY COMFORT PEN NEEDLES) 31G X 5 MM MISC Use 1x a day 100 each 3  . lamoTRIgine (LAMICTAL) 200 MG tablet Take 400 mg by mouth daily.    Marland Kitchen levothyroxine (SYNTHROID, LEVOTHROID) 50 MCG tablet Take 1 tablet by mouth  daily 90 tablet 3  . lisinopril-hydrochlorothiazide (PRINZIDE,ZESTORETIC) 20-12.5 MG tablet Take 1 tablet by mouth  daily 90 tablet 0  . metFORMIN (GLUCOPHAGE-XR) 500 MG 24 hr tablet TAKE 2 TABLETS BY MOUTH 2  TIMES DAILY WITH A MEAL. 60 tablet 1  . metoprolol tartrate (LOPRESSOR) 25 MG tablet Take 1 tablet by mouth two  times daily 180 tablet 0  . Omega-3 350 MG CAPS Take 350 mg by mouth daily.      Marland Kitchen omeprazole (PRILOSEC) 20 MG capsule Take 20 mg by mouth daily.    [name] DELICA LANCETS 61W MISC USE AS DIRECTED TO CHECK BLOOD SUGAR TWICE DAILY. 100 each 11  . ONETOUCH VERIO test strip CHECK BLOOD SUGAR TWICE DAILY. 100 each 11  . zolpidem (AMBIEN) 10 MG tablet Take 10 mg by mouth at bedtime as needed for sleep.     No current facility-administered medications for this visit.      ALLERGIES: Oxycodone-acetaminophen; Codeine phosphate; Erythromycin; Morphine sulfate; Oxycodone hcl; Sulfamethoxazole; and Tylenol [acetaminophen]  Family History  Problem Relation Age of Onset  . COPD Mother   . Diabetes  Mother   . Heart attack Father 28  . Hypertension Father   . Heart attack Sister 39  . Diabetes Sister   . Hypertension Sister   . Diabetes Brother   . Hypertension Sister   . Diabetes Sister   . Hyperlipidemia      fhx  . Hypertension      fhx  .  Cancer      ovarian/grandmother  . Ovarian cancer Maternal Aunt   . Diabetes Maternal Aunt   . Stomach cancer Maternal Grandmother   . Liver cancer Maternal Grandmother     Social History   Social History  . Marital status: Married    Spouse name: N/A  . Number of children: 2  . Years of education: 38   Occupational History  . disabled     orthopedic / psych   Social History Main Topics  . Smoking status: Never Smoker  . Smokeless tobacco: Never Used  . Alcohol use 0.0 oz/week     Comment: 2-3 drinks a year  . Drug use: No  . Sexual activity: Yes    Partners: Male    Birth control/ protection: Surgical     Comment: TAH   Other Topics Concern  . Not on file   Social History Narrative   Grew up in [place]. Currently resides in resides in a house with her husband. 4 dogs. Fun: Play with your dogs and be with grandchildren, needle work.    Denies any religious beliefs effecting healthcare.        ROS:  Pertinent items are noted in HPI.  PHYSICAL EXAMINATION:    LMP 12/31/1993     General appearance: alert, cooperative and appears stated age    Pelvic: External genitalia: fusion of labia majora with labia minora bilaterally.   Extensive ecchymoses bilateral labia and erythema.  Hypopigmentation of the medial labia and perineum.               Urethra:  normal appearing urethra with no masses, tenderness or lesions              Bartholins and Skenes: normal                 Vagina: normal appearing vagina with normal color and discharge, no lesions              Cervix:  absent                Bimanual Exam:  Uterus:  absent              Adnexa: no mass, fullness, tenderness             Chaperone was present for  exam.  ASSESSMENT  Chronic vulvitis with lichen sclerosus.  Superimposed yeast vulvitis.  PLAN  Affirm taken.  Discussion of lichen sclerosus and yeast infection. Will treat with Nystatin ointment bid for one week.  Then start Clobetasol ointment 0.05% bid for one week.  Then return for annual exam and recheck appointment in 2 weeks. She will schedule her mammogram at [facility].  Phone number given.  Hep C aby drawn today.   An After Visit Summary was printed and given to the patient.  ___25___ minutes face to face time of which over 50% was spent in counseling.

## 2016-10-12 NOTE — Addendum Note (Signed)
Addended by: Nile Riggs on: 10/12/2016 09:50 AM   Modules accepted: Orders

## 2016-10-12 NOTE — Patient Instructions (Signed)
Please continue Metformin ER 1000 mg 2x day.  Please check sugars once daily.  Please come back for a follow-up appointment in 3 months - with your log.

## 2016-10-13 LAB — WET PREP BY MOLECULAR PROBE
CANDIDA SPECIES: NEGATIVE
Gardnerella vaginalis: NEGATIVE
TRICHOMONAS VAG: NEGATIVE

## 2016-10-13 LAB — HEPATITIS C ANTIBODY: HCV Ab: NEGATIVE

## 2016-10-22 ENCOUNTER — Other Ambulatory Visit: Payer: Self-pay | Admitting: Internal Medicine

## 2016-10-22 ENCOUNTER — Other Ambulatory Visit: Payer: Self-pay | Admitting: Obstetrics and Gynecology

## 2016-10-22 DIAGNOSIS — Z1231 Encounter for screening mammogram for malignant neoplasm of breast: Secondary | ICD-10-CM

## 2016-10-26 ENCOUNTER — Ambulatory Visit: Payer: Medicare Other | Admitting: Obstetrics and Gynecology

## 2016-10-29 ENCOUNTER — Encounter: Payer: Self-pay | Admitting: Obstetrics and Gynecology

## 2016-10-29 ENCOUNTER — Ambulatory Visit (INDEPENDENT_AMBULATORY_CARE_PROVIDER_SITE_OTHER): Payer: Medicare Other | Admitting: Obstetrics and Gynecology

## 2016-10-29 VITALS — BP 112/60 | HR 76 | Resp 15 | Wt 220.0 lb

## 2016-10-29 DIAGNOSIS — L900 Lichen sclerosus et atrophicus: Secondary | ICD-10-CM

## 2016-10-29 DIAGNOSIS — Z01411 Encounter for gynecological examination (general) (routine) with abnormal findings: Secondary | ICD-10-CM

## 2016-10-29 NOTE — Patient Instructions (Signed)

## 2016-10-29 NOTE — Progress Notes (Signed)
66 y.o. G12P2002 Married Caucasian female here for annual exam.    Patient seen 2 weeks ago for vulvar burning and known dx of lichen sclerosus by biopsy March 2016. First used the Nystatin for one week. Then started clobetasol ointment 0.05% bid for the last week. Feels better overall.  States her vulvar pain is about a 4 to 6 in a scale of 10.  Feels burning when the urine hits the skin.   HgbA1C 7 on 10/12/16.  PCP:  Mauricio Po, FNP  Patient's last menstrual period was 12/31/1993.       Sexually active: No.  The current method of family planning is status post hysterectomy.    Exercising: No.   Smoker:  no  Health Maintenance: Pap:  2009 normal History of abnormal Pap:  no MMG:  03-09-15 Density B/Neg/BiRads1:[facility].Patient has appointment 10-31-16 Colonoscopy: Scheduled 11-14-16 with Dr. Henrene Pastor.  Will also do an endoscopy due to reflux.  BMD:   03-09-15  Result: [place] TDaP:  09-19-07 Gardasil:   N/A Hep C:  neg Screening Labs:  Hb today: PCP, Urine today: PCP   reports that she has never smoked. She has never used smokeless tobacco. She reports that she does not drink alcohol or use drugs.  Past Medical History:  Diagnosis Date  . Allergy   . Anxiety   . Arthritis   . Asthma   . Blood transfusion without reported diagnosis 2002   with knee replacement  . Depression   . Diabetes mellitus   . Diabetes mellitus type 2 with retinopathy ([place])   . Diverticulitis   . Dyspareunia    vaginal dryness  . Fibroid    reason for hysterectomy  . GERD (gastroesophageal reflux disease)   . Hyperlipidemia   . Hypertension   . IBS (irritable bowel syndrome)   . OSA (obstructive sleep apnea)   . Osteopenia 2009  . STD (sexually transmitted disease)    Hx HPV  . Thyroid disease     Past Surgical History:  Procedure Laterality Date  . ABDOMINAL HYSTERECTOMY  1995   TAH/LSO--Dr. Ubaldo Glassing  . [place]  . FOOT SURGERY Bilateral   .  NASAL SINUS SURGERY    . OOPHORECTOMY    . PARTIAL COLECTOMY     -RSO  . ROTATOR CUFF REPAIR Left   . TOTAL KNEE ARTHROPLASTY Bilateral 2002, 2004    Current Outpatient Prescriptions  Medication Sig Dispense Refill  . ALPRAZolam (XANAX) 1 MG tablet Take 0.5-2 mg by mouth every 4 (four) hours as needed for anxiety or sleep. Take 1-2 tabs up to 6 times daily    . Ascorbic Acid (VITAMIN C) 1000 MG tablet Take 1,000 mg by mouth daily.    Marland Kitchen aspirin 81 MG tablet Take 81 mg by mouth daily.     Marland Kitchen atorvastatin (LIPITOR) 10 MG tablet Take 1 tablet (10 mg total) by mouth daily. 30 tablet 1  . ATROVENT HFA 17 MCG/ACT inhaler USE 2 PUFFS EVERY 4 HOURS  AS NEEDED FOR WHEEZING. 12.9 g 0  . Blood Glucose Monitoring Suppl (ONETOUCH VERIO) W/DEVICE KIT 1 each by Does not apply route daily. 1 kit 0  . Calcium Carbonate-Vit D-Min (CALCIUM 1200 PO) Take 1 tablet by mouth daily.    . cetirizine (ZYRTEC) 10 MG tablet Take 10 mg by mouth daily.     . Cholecalciferol (VITAMIN D3) 1000 UNITS CAPS Take 1 capsule by mouth daily.      Marland Kitchen  clobetasol ointment (TEMOVATE) 2.44 % Apply 1 application topically 2 (two) times daily. Apply as a thin layer to the affected areas. 60 g 1  . co-enzyme Q-10 30 MG capsule Take 30 mg by mouth daily.      . cyanocobalamin 100 MCG tablet Take 100 mcg by mouth daily.      Marland Kitchen desvenlafaxine (PRISTIQ) 100 MG 24 hr tablet Take 100 mg by mouth daily.      . fluticasone furoate-vilanterol (BREO ELLIPTA) 100-25 MCG/INH AEPB Inhale 1 puff into the lungs daily. 28 each 0  . Garlic 628 MG TABS Take 1 tablet by mouth daily.      . Insulin Pen Needle (EASY COMFORT PEN NEEDLES) 31G X 5 MM MISC Use 1x a day 100 each 3  . lamoTRIgine (LAMICTAL) 200 MG tablet Take 400 mg by mouth daily.    Marland Kitchen levothyroxine (SYNTHROID, LEVOTHROID) 50 MCG tablet Take 1 tablet by mouth  daily 90 tablet 3  . lisinopril-hydrochlorothiazide (PRINZIDE,ZESTORETIC) 20-12.5 MG tablet Take 1 tablet by mouth  daily 90 tablet 0  .  metFORMIN (GLUCOPHAGE-XR) 500 MG 24 hr tablet TAKE 2 TABLETS BY MOUTH 2  TIMES DAILY WITH A MEAL. 120 tablet 1  . metoprolol tartrate (LOPRESSOR) 25 MG tablet Take 1 tablet by mouth two  times daily 180 tablet 0  . nystatin ointment (MYCOSTATIN) Apply 1 application topically 2 (two) times daily. Apply to affected area for up to 7 days. 30 g 0  . Omega-3 350 MG CAPS Take 350 mg by mouth daily.      Marland Kitchen omeprazole (PRILOSEC) 20 MG capsule Take 20 mg by mouth daily.    [name] DELICA LANCETS 63O MISC USE AS DIRECTED TO CHECK BLOOD SUGAR TWICE DAILY. 100 each 11  . ONETOUCH VERIO test strip CHECK BLOOD SUGAR TWICE DAILY. 100 each 11  . zolpidem (AMBIEN) 10 MG tablet Take 10 mg by mouth at bedtime as needed for sleep.     No current facility-administered medications for this visit.     Family History  Problem Relation Age of Onset  . COPD Mother   . Diabetes Mother   . Heart attack Father 51  . Hypertension Father   . Heart attack Sister 18  . Diabetes Sister   . Hypertension Sister   . Diabetes Brother   . Hypertension Sister   . Diabetes Sister   . Hyperlipidemia      fhx  . Hypertension      fhx  . Cancer      ovarian/grandmother  . Ovarian cancer Maternal Aunt   . Diabetes Maternal Aunt   . Stomach cancer Maternal Grandmother   . Liver cancer Maternal Grandmother     ROS:  Pertinent items are noted in HPI.  Otherwise, a comprehensive ROS was negative.  Exam:   BP 112/60 (BP Location: Right Wrist, Patient Position: Sitting, Cuff Size: Normal)   Pulse 76   Resp 15   Wt 220 lb (99.8 kg)   LMP 12/31/1993   BMI 40.90 kg/m     General appearance: alert, cooperative and appears stated age Head: Normocephalic, without obvious abnormality, atraumatic Neck: no adenopathy, supple, symmetrical, trachea midline and thyroid normal to inspection and palpation Lungs: clear to auscultation bilaterally Breasts: normal appearance, no masses or tenderness, No nipple retraction or  dimpling, No nipple discharge or bleeding, No axillary or supraclavicular adenopathy Heart: regular rate and rhythm Abdomen: soft, non-tender; no masses, no organomegaly Extremities: extremities normal, atraumatic, no cyanosis  or edema Skin: Skin color, texture, turgor normal. No rashes or lesions Lymph nodes: Cervical, supraclavicular, and axillary nodes normal. No abnormal inguinal nodes palpated Neurologic: Grossly normal  Pelvic: External genitalia:  no lesions.  Left medial labia minora has skin break down.  Ecchymoses of bilateral labia minora.               Urethra:  normal appearing urethra with no masses, tenderness or lesions              Bartholins and Skenes: normal                 Vagina: normal appearing vagina with normal color and discharge, no lesions              Cervix:  Absent.              Pap taken: No. Bimanual Exam:  Uterus:   Absent.              Adnexa: no mass, fullness, tenderness              Rectal exam: Yes.  .  Confirms.              Anus:  normal sphincter tone, no lesions  Chaperone was present for exam.  Assessment:   Well woman visit with normal exam. Status post hysterectomy.  TAH/LSO Lichen sclerosus.  Osteopenia.   Plan: Yearly mammogram recommended after age 54.  Recommended self breast exam.  Pap and HR HPV as above. Discussed Calcium, Vitamin D, regular exercise program including cardiovascular and weight bearing exercise. Continue clobetasol ointment 0.05% bid for one month.  Apply a thin layer to the skin.  Areas of application reviewed.  Return in 4 weeks for a recheck.   BMD next year.  Follow up annually and prn.       After visit summary provided.

## 2016-10-30 ENCOUNTER — Encounter: Payer: Self-pay | Admitting: Obstetrics and Gynecology

## 2016-10-31 ENCOUNTER — Encounter: Payer: Self-pay | Admitting: Internal Medicine

## 2016-10-31 ENCOUNTER — Ambulatory Visit: Payer: Medicare Other

## 2016-11-12 ENCOUNTER — Other Ambulatory Visit: Payer: Self-pay | Admitting: Internal Medicine

## 2016-11-14 ENCOUNTER — Ambulatory Visit (AMBULATORY_SURGERY_CENTER): Payer: Medicare Other | Admitting: Internal Medicine

## 2016-11-14 ENCOUNTER — Encounter: Payer: Self-pay | Admitting: Internal Medicine

## 2016-11-14 VITALS — BP 112/51 | HR 60 | Temp 96.9°F | Resp 19 | Ht 65.0 in | Wt 225.0 lb

## 2016-11-14 DIAGNOSIS — K222 Esophageal obstruction: Secondary | ICD-10-CM

## 2016-11-14 DIAGNOSIS — R1310 Dysphagia, unspecified: Secondary | ICD-10-CM

## 2016-11-14 DIAGNOSIS — Z1212 Encounter for screening for malignant neoplasm of rectum: Secondary | ICD-10-CM

## 2016-11-14 DIAGNOSIS — K253 Acute gastric ulcer without hemorrhage or perforation: Secondary | ICD-10-CM

## 2016-11-14 DIAGNOSIS — Z1211 Encounter for screening for malignant neoplasm of colon: Secondary | ICD-10-CM | POA: Diagnosis present

## 2016-11-14 LAB — GLUCOSE, CAPILLARY
GLUCOSE-CAPILLARY: 126 mg/dL — AB (ref 65–99)
Glucose-Capillary: 189 mg/dL — ABNORMAL HIGH (ref 65–99)

## 2016-11-14 MED ORDER — SODIUM CHLORIDE 0.9 % IV SOLN: 500.0000 mL | INTRAVENOUS | Status: DC

## 2016-11-14 NOTE — Progress Notes (Signed)
Called to room to assist during endoscopic procedure.  Patient ID and intended procedure confirmed with present staff. Received instructions for my participation in the procedure from the performing physician.  

## 2016-11-14 NOTE — Op Note (Signed)
[name] Patient Name: Meredith Hayden Procedure Date: 11/14/2016 10:52 AM MRN: [id number] Endoscopist: Docia Chuck. Henrene Pastor , MD Age: 66 Referring MD:  Date of Birth: 20-Oct-1950 Gender: Female Account #: [id number] Procedure:                Colonoscopy Indications:              Screening for colorectal malignant neoplasm. Last                            exam 2006 was negative for neoplasia Medicines:                Monitored Anesthesia Care Procedure:                Pre-Anesthesia Assessment:                           - Prior to the procedure, a History and Physical                            was performed, and patient medications and                            allergies were reviewed. The patient's tolerance of                            previous anesthesia was also reviewed. The risks                            and benefits of the procedure and the sedation                            options and risks were discussed with the patient.                            All questions were answered, and informed consent                            was obtained. Prior Anticoagulants: The patient has                            taken no previous anticoagulant or antiplatelet                            agents. ASA Grade Assessment: II - A patient with                            mild systemic disease. After reviewing the risks                            and benefits, the patient was deemed in                            satisfactory condition to undergo the procedure.  After obtaining informed consent, the colonoscope                            was passed under direct vision. Throughout the                            procedure, the patient's blood pressure, pulse, and                            oxygen saturations were monitored continuously. The                            Model CF-HQ190L [id number]) scope was introduced                            through the anus and  advanced to the the cecum,                            identified by appendiceal orifice and ileocecal                            valve. The ileocecal valve, appendiceal orifice,                            and rectum were photographed. The quality of the                            bowel preparation was excellent. The colonoscopy                            was performed without difficulty. The patient                            tolerated the procedure well. The bowel preparation                            used was SUPREP. Scope In: 11:07:08 AM Scope Out: 11:15:22 AM Scope Withdrawal Time: 0 hours 6 minutes 20 seconds  Total Procedure Duration: 0 hours 8 minutes 14 seconds  Findings:                 The entire examined colon appeared normal on direct                            and retroflexion views. Evidence of prior sigmoid                            colon resection with an unremarkable anastomosis at                            20 cm. Internal hemorrhoids present. Complications:            No immediate complications. Estimated blood loss:  None. Estimated Blood Loss:     Estimated blood loss: none. Impression:               - The entire examined colon is normal on direct and                            retroflexion views. Evidence for prior sigmoid                            resection.                           - No specimens collected. Recommendation:           - Repeat colonoscopy in 10 years for screening                            purposes.                           - EGD today. Please see report. Docia Chuck. Henrene Pastor, MD 11/14/2016 11:25:31 AM This report has been signed electronically.

## 2016-11-14 NOTE — Progress Notes (Signed)
A and O x3. Report to RN. Tolerated MAC anesthesia well.Teeth unchanged after procedure. 

## 2016-11-14 NOTE — Patient Instructions (Signed)
Impression/Recommendations:  Repeat colonoscopy in 10 years for screening purposes.  GERD handout given to patient.  Post dilatation diet given to and reviewed with patient and care partner.  Continue current medications.  YOU HAD AN ENDOSCOPIC PROCEDURE TODAY AT [facility]:   Refer to the procedure report that was given to you for any specific questions about what was found during the examination.  If the procedure report does not answer your questions, please call your gastroenterologist to clarify.  If you requested that your care partner not be given the details of your procedure findings, then the procedure report has been included in a sealed envelope for you to review at your convenience later.  YOU SHOULD EXPECT: Some feelings of bloating in the abdomen. Passage of more gas than usual.  Walking can help get rid of the air that was put into your GI tract during the procedure and reduce the bloating. If you had a lower endoscopy (such as a colonoscopy or flexible sigmoidoscopy) you may notice spotting of blood in your stool or on the toilet paper. If you underwent a bowel prep for your procedure, you may not have a normal bowel movement for a few days.  Please Note:  You might notice some irritation and congestion in your nose or some drainage.  This is from the oxygen used during your procedure.  There is no need for concern and it should clear up in a day or so.  SYMPTOMS TO REPORT IMMEDIATELY:   Following lower endoscopy (colonoscopy or flexible sigmoidoscopy):  Excessive amounts of blood in the stool  Significant tenderness or worsening of abdominal pains  Swelling of the abdomen that is new, acute  Fever of 100F or higher   Following upper endoscopy (EGD)  Vomiting of blood or coffee ground material  New chest pain or pain under the shoulder blades  Painful or persistently difficult swallowing  New shortness of breath  Fever of 100F or higher  Black,  tarry-looking stools  For urgent or emergent issues, a gastroenterologist can be reached at any hour by calling [phone].   DIET:  We do recommend a small meal at first, but then you may proceed to your regular diet.  Drink plenty of fluids but you should avoid alcoholic beverages for 24 hours.  ACTIVITY:  You should plan to take it easy for the rest of today and you should NOT DRIVE or use heavy machinery until tomorrow (because of the sedation medicines used during the test).    FOLLOW UP: Our staff will call the number listed on your records the next business day following your procedure to check on you and address any questions or concerns that you may have regarding the information given to you following your procedure. If we do not reach you, we will leave a message.  However, if you are feeling well and you are not experiencing any problems, there is no need to return our call.  We will assume that you have returned to your regular daily activities without incident.  If any biopsies were taken you will be contacted by phone or by letter within the next 1-3 weeks.  Please call us at [phone] if you have not heard about the biopsies in 3 weeks.    SIGNATURES/CONFIDENTIALITY: You and/or your care partner have signed paperwork which will be entered into your electronic medical record.  These signatures attest to the fact that that the information above on your After Visit Summary  has been reviewed and is understood.  Full responsibility of the confidentiality of this discharge information lies with you and/or your care-partner. 

## 2016-11-14 NOTE — Op Note (Signed)
[facility] Patient Name: Meredith Hayden Procedure Date: 11/14/2016 10:52 AM MRN: [id number] Endoscopist: Docia Chuck. Henrene Pastor , MD Age: 66 Referring MD:  Date of Birth: 15-Sep-1950 Gender: Female Account #: [id number] Procedure:                Upper GI endoscopy, with biopsy and Maloney                            dilation 77F Indications:              Dysphagia, Esophageal reflux Medicines:                Monitored Anesthesia Care Procedure:                Pre-Anesthesia Assessment:                           - Prior to the procedure, a History and Physical                            was performed, and patient medications and                            allergies were reviewed. The patient's tolerance of                            previous anesthesia was also reviewed. The risks                            and benefits of the procedure and the sedation                            options and risks were discussed with the patient.                            All questions were answered, and informed consent                            was obtained. Prior Anticoagulants: The patient has                            taken no previous anticoagulant or antiplatelet                            agents. ASA Grade Assessment: II - A patient with                            mild systemic disease. After reviewing the risks                            and benefits, the patient was deemed in                            satisfactory condition to undergo the procedure.  After obtaining informed consent, the endoscope was                            passed under direct vision. Throughout the                            procedure, the patient's blood pressure, pulse, and                            oxygen saturations were monitored continuously. The                            Model GIF-HQ190 [id number]) scope was introduced                            through the mouth, and advanced to the  second part                            of duodenum. The upper GI endoscopy was                            accomplished without difficulty. The patient                            tolerated the procedure well. Scope In: Scope Out: Findings:                 One mild benign-appearing, intrinsic stenosis was                            found. This measured 1.5 cm (inner diameter). The                            scope was withdrawn. Dilation was performed with a                            Maloney dilator with no resistance at 42 Fr.                           The exam of the esophagus was otherwise normal.                           The proximal stomach was normal. Small hiatal                            hernia present.                           The examined duodenum was normal.                           The cardia and gastric fundus were normal on                            retroflexion.  Multiple erosions were found in the gastric antrum.                            Biopsies were taken with a cold forceps for                            Helicobacter pylori testing using CLOtest. Complications:            No immediate complications. Estimated Blood Loss:     Estimated blood loss: none. Impression:               1. GERD                           2. Benign esophageal stricture status post dilation                            59 F Maloney                           3. Antral erosions. Status post biopsy.                           4. Otherwise normal exam. Recommendation:           1. Post-dilation diet                           2. Reflux precautions                           3. Sustained weight loss                           4. Continue current medications                           5. GI follow-up as needed. Docia Chuck. Henrene Pastor, MD 11/14/2016 11:33:31 AM This report has been signed electronically.

## 2016-11-15 ENCOUNTER — Telehealth: Payer: Self-pay | Admitting: *Deleted

## 2016-11-15 ENCOUNTER — Other Ambulatory Visit: Payer: Self-pay

## 2016-11-15 LAB — HELICOBACTER PYLORI SCREEN-BIOPSY: UREASE: POSITIVE — AB

## 2016-11-15 MED ORDER — TETRACYCLINE HCL 500 MG PO CAPS: 500.0000 mg | ORAL_CAPSULE | Freq: Four times a day (QID) | ORAL | 0 refills | Status: DC

## 2016-11-15 MED ORDER — CIMETIDINE 400 MG PO TABS: 400.0000 mg | ORAL_TABLET | Freq: Two times a day (BID) | ORAL | 0 refills | Status: DC

## 2016-11-15 MED ORDER — METRONIDAZOLE 250 MG PO TABS: 250.0000 mg | ORAL_TABLET | Freq: Four times a day (QID) | ORAL | 0 refills | Status: DC

## 2016-11-15 MED ORDER — BISMUTH 262 MG PO CHEW: 524.0000 mg | CHEWABLE_TABLET | Freq: Three times a day (TID) | ORAL | 0 refills | Status: DC

## 2016-11-15 NOTE — Telephone Encounter (Signed)
  Follow up Call-  Call back number 11/14/2016  Post procedure Call Back phone  # [phone]  Permission to leave phone message Yes  Some recent data might be hidden     Patient questions:  Do you have a fever, pain , or abdominal swelling? No. Pain Score  0 *  Have you tolerated food without any problems? Yes.    Have you been able to return to your normal activities? Yes.    Do you have any questions about your discharge instructions: Diet   No. Medications  No. Follow up visit  No.  Do you have questions or concerns about your Care? No.  Actions: * If pain score is 4 or above: No action needed, pain <4.

## 2016-11-19 ENCOUNTER — Telehealth: Payer: Self-pay | Admitting: Internal Medicine

## 2016-11-19 NOTE — Telephone Encounter (Signed)
No. Findings another pharmacy that has the appropriate combination of medications. This is not an emergency, thus if she needs to a few weeks to get the proper medication, that is okay

## 2016-11-19 NOTE — Telephone Encounter (Signed)
Spoke with [facility] and they can get the tetracycline but the pt cannot afford the copay. Please advise.

## 2016-11-19 NOTE — Telephone Encounter (Signed)
Left message for pt to call back. Pylera samples can be provided for pt.

## 2016-11-19 NOTE — Telephone Encounter (Signed)
Dr. Henrene Pastor the pharmacy states they do not have tetracycline for pts Helidac and are requesting an alternative. Would Amoxicillin 500mg  QID for 14 days be acceptable? Please advise.

## 2016-11-19 NOTE — Telephone Encounter (Signed)
I did advise... Try Prevpac equivalent for 2 weeks. If there is some issue with this, please recommend feasible options

## 2016-11-20 MED ORDER — BIS SUBCIT-METRONID-TETRACYC 140-125-125 MG PO CAPS: 3.0000 | ORAL_CAPSULE | Freq: Three times a day (TID) | ORAL | 0 refills | Status: DC

## 2016-11-20 NOTE — Telephone Encounter (Signed)
Pylera samples left up front for pt to pick up.

## 2016-11-20 NOTE — Addendum Note (Signed)
Addended by: Rosanne Sack R on: 11/20/2016 12:05 PM   Modules accepted: Orders

## 2016-11-28 ENCOUNTER — Ambulatory Visit: Payer: Medicare Other | Admitting: Obstetrics and Gynecology

## 2016-11-28 ENCOUNTER — Telehealth: Payer: Self-pay | Admitting: Obstetrics and Gynecology

## 2016-11-28 NOTE — Telephone Encounter (Signed)
Thank you.  ?You may close the encounter.  ?

## 2016-11-28 NOTE — Telephone Encounter (Signed)
Patient cancelled appointment today because she is sick. Rescheduled to Friday 11/30/16.

## 2016-11-30 ENCOUNTER — Ambulatory Visit: Payer: Medicare Other | Admitting: Obstetrics and Gynecology

## 2016-11-30 ENCOUNTER — Telehealth: Payer: Self-pay | Admitting: Obstetrics and Gynecology

## 2016-11-30 NOTE — Telephone Encounter (Signed)
Patient canceled her appointment for 4 week recheck today due to illness. Patient said the medication she is taking for 1 more week is giving her diarrhea. Patient does not feel comfortable being examined until she is off this medication. Patient rescheduled to 12/10/16.

## 2016-11-30 NOTE — Telephone Encounter (Signed)
Thank you for the update!

## 2016-12-03 ENCOUNTER — Other Ambulatory Visit: Payer: Self-pay | Admitting: Family

## 2016-12-10 ENCOUNTER — Encounter: Payer: Self-pay | Admitting: Obstetrics and Gynecology

## 2016-12-10 ENCOUNTER — Ambulatory Visit (INDEPENDENT_AMBULATORY_CARE_PROVIDER_SITE_OTHER): Payer: Medicare Other | Admitting: Obstetrics and Gynecology

## 2016-12-10 VITALS — BP 112/72 | HR 88 | Resp 16 | Wt 219.0 lb

## 2016-12-10 DIAGNOSIS — B373 Candidiasis of vulva and vagina: Secondary | ICD-10-CM

## 2016-12-10 DIAGNOSIS — B3731 Acute candidiasis of vulva and vagina: Secondary | ICD-10-CM

## 2016-12-10 DIAGNOSIS — R32 Unspecified urinary incontinence: Secondary | ICD-10-CM

## 2016-12-10 DIAGNOSIS — N9089 Other specified noninflammatory disorders of vulva and perineum: Secondary | ICD-10-CM

## 2016-12-10 NOTE — Progress Notes (Signed)
GYNECOLOGY  VISIT   HPI: 66 y.o.   Married  Caucasian  female   G2P2002 with Patient's last menstrual period was 12/31/1993.   here for 6 week follow up.    Has lichen sclerosus.   Just treated for H Pylori.  Took abx for 14 days - tetracycline, flagyl combination.   Her vulva is more irritated now.   Has been using clobetasol twice a day other than when she had frequent bowel movements during her tx with abx for H Pylori.   Has esophageal stretching at time of treatment for H Pylori.   Used Nystatin ointment last one month ago.   Losing sensation with bladder is full.  Can stand and leak a little.  Drinks tea - 2 -3 cups per day.   Takes baby ASA daily.   GYNECOLOGIC HISTORY: Patient's last menstrual period was 12/31/1993. Contraception:  Hysterectomy Menopausal hormone therapy:  none Last mammogram:   03-09-15 Density B/Neg/BiRads1:[facility]. Last pap smear:  2009 normal          OB History    Gravida Para Term Preterm AB Living   '2 2 2 ' 0 0 2   SAB TAB Ectopic Multiple Live Births   0 0 0 0 2         Patient Active Problem List   Diagnosis Date Noted  . Cough 07/11/2016  . Medicare annual wellness visit, subsequent 03/06/2016  . Routine general medical examination at a health care facility 03/06/2016  . Type 2 diabetes mellitus with retinopathy, without long-term current use of insulin ([place]) 02/21/2016  . Fatty liver disease, nonalcoholic 53/66/4403  . Atypical chest pain 02/21/2015  . PSVT (paroxysmal supraventricular tachycardia) ([place])   . IRRITABLE BOWEL SYNDROME 03/05/2008  . DISORDER, BIPOLAR NEC 09/19/2007  . SLEEP APNEA 09/19/2007  . Hypothyroidism 08/26/2007  . OSTEOARTHRITIS 08/26/2007  . Hyperlipidemia 10/31/2006  . OBESITY, MORBID 10/31/2006  . ANXIETY 10/31/2006  . Depression 10/31/2006  . Essential hypertension 10/31/2006  . ALLERGIC RHINITIS 10/31/2006  . ASTHMA 10/31/2006  . DIVERTICULOSIS, COLON 10/31/2006  . COLONIC POLYPS  11/29/2005    Past Medical History:  Diagnosis Date  . Allergy   . Anxiety   . Arthritis   . Asthma   . Blood transfusion without reported diagnosis 2002   with knee replacement  . Depression   . Diabetes mellitus   . Diabetes mellitus type 2 with retinopathy ([place])   . Diverticulitis   . Dyspareunia    vaginal dryness  . Fibroid    reason for hysterectomy  . GERD (gastroesophageal reflux disease)   . Hyperlipidemia   . Hypertension   . IBS (irritable bowel syndrome)   . OSA (obstructive sleep apnea)   . Osteopenia 2009  . Sleep apnea   . STD (sexually transmitted disease)    Hx HPV  . Thyroid disease     Past Surgical History:  Procedure Laterality Date  . ABDOMINAL HYSTERECTOMY  1995   TAH/LSO--Dr. Ubaldo Glassing  . [place]  . FOOT SURGERY Bilateral   . NASAL SINUS SURGERY    . OOPHORECTOMY    . PARTIAL COLECTOMY     -RSO  . ROTATOR CUFF REPAIR Left   . TOTAL KNEE ARTHROPLASTY Bilateral 2002, 2004    Current Outpatient Prescriptions  Medication Sig Dispense Refill  . ALPRAZolam (XANAX) 1 MG tablet Take 0.5-2 mg by mouth every 4 (four) hours as needed for anxiety or sleep. Take 1-2 tabs up to 6  times daily    . amphetamine-dextroamphetamine (ADDERALL XR) 20 MG 24 hr capsule Take 20 mg by mouth daily.    . Ascorbic Acid (VITAMIN C) 1000 MG tablet Take 1,000 mg by mouth daily.    Marland Kitchen aspirin 81 MG tablet Take 81 mg by mouth daily.     Marland Kitchen atorvastatin (LIPITOR) 10 MG tablet TAKE 1 TABLET BY MOUTH  DAILY 60 tablet 2  . ATROVENT HFA 17 MCG/ACT inhaler USE 2 PUFFS EVERY 4 HOURS  AS NEEDED FOR WHEEZING. 12.9 g 0  . Blood Glucose Monitoring Suppl (ONETOUCH VERIO) W/DEVICE KIT 1 each by Does not apply route daily. 1 kit 0  . Calcium Carbonate-Vit D-Min (CALCIUM 1200 PO) Take 1 tablet by mouth daily.    . cetirizine (ZYRTEC) 10 MG tablet Take 10 mg by mouth daily.     . Cholecalciferol (VITAMIN D3) 1000 UNITS CAPS Take 1 capsule by mouth daily.      . clobetasol  ointment (TEMOVATE) 1.61 % Apply 1 application topically 2 (two) times daily. Apply as a thin layer to the affected areas. 60 g 1  . co-enzyme Q-10 30 MG capsule Take 30 mg by mouth daily.      . cyanocobalamin 100 MCG tablet Take 100 mcg by mouth daily.      Marland Kitchen desvenlafaxine (PRISTIQ) 100 MG 24 hr tablet Take 100 mg by mouth daily.      . fluticasone furoate-vilanterol (BREO ELLIPTA) 100-25 MCG/INH AEPB Inhale 1 puff into the lungs daily. 28 each 0  . Garlic 096 MG TABS Take 1 tablet by mouth daily.      Marland Kitchen lamoTRIgine (LAMICTAL) 200 MG tablet Take 400 mg by mouth daily.    Marland Kitchen levothyroxine (SYNTHROID, LEVOTHROID) 50 MCG tablet Take 1 tablet by mouth  daily 90 tablet 3  . lisinopril-hydrochlorothiazide (PRINZIDE,ZESTORETIC) 20-12.5 MG tablet TAKE 1 TABLET BY MOUTH  DAILY 90 tablet 0  . metFORMIN (GLUCOPHAGE-XR) 500 MG 24 hr tablet TAKE 2 TABLETS BY MOUTH 2  TIMES DAILY WITH A MEAL. 120 tablet 1  . metoprolol tartrate (LOPRESSOR) 25 MG tablet TAKE 1 TABLET BY MOUTH TWO  TIMES DAILY 180 tablet 0  . nystatin ointment (MYCOSTATIN) Apply 1 application topically 2 (two) times daily. Apply to affected area for up to 7 days. 30 g 0  . Omega-3 350 MG CAPS Take 350 mg by mouth daily.      Marland Kitchen omeprazole (PRILOSEC) 20 MG capsule Take 20 mg by mouth 2 (two) times daily before a meal.     . ONETOUCH DELICA LANCETS 04V MISC USE AS DIRECTED TO CHECK BLOOD SUGAR TWICE DAILY. 100 each 11  . ONETOUCH VERIO test strip CHECK BLOOD SUGAR TWICE DAILY. 100 each 11  . zolpidem (AMBIEN) 10 MG tablet Take 10 mg by mouth at bedtime as needed for sleep.    . Insulin Pen Needle (EASY COMFORT PEN NEEDLES) 31G X 5 MM MISC Use 1x a day (Patient not taking: Reported on 12/10/2016) 100 each 3   Current Facility-Administered Medications  Medication Dose Route Frequency Provider Last Rate Last Dose  . 0.9 %  sodium chloride infusion  500 mL Intravenous Continuous Irene Shipper, MD         ALLERGIES: Oxycodone-acetaminophen; Codeine  phosphate; Erythromycin; Morphine sulfate; Oxycodone hcl; Sulfamethoxazole; and Tylenol [acetaminophen]  Family History  Problem Relation Age of Onset  . COPD Mother   . Diabetes Mother   . Heart attack Father 60  . Hypertension Father   .  Heart attack Sister 53  . Diabetes Sister   . Hypertension Sister   . Diabetes Brother   . Hypertension Sister   . Diabetes Sister   . Hyperlipidemia      fhx  . Hypertension      fhx  . Cancer      ovarian/grandmother  . Ovarian cancer Maternal Aunt   . Diabetes Maternal Aunt   . Stomach cancer Maternal Grandmother   . Liver cancer Maternal Grandmother     Social History   Social History  . Marital status: Married    Spouse name: N/A  . Number of children: 2  . Years of education: 80   Occupational History  . disabled     orthopedic / psych   Social History Main Topics  . Smoking status: Never Smoker  . Smokeless tobacco: Never Used  . Alcohol use No  . Drug use: No  . Sexual activity: No     Comment: TAH   Other Topics Concern  . Not on file   Social History Narrative   Grew up in [place]. Currently resides in resides in a house with her husband. 4 dogs. Fun: Play with your dogs and be with grandchildren, needle work.    Denies any religious beliefs effecting healthcare.        ROS:  Pertinent items are noted in HPI.  PHYSICAL EXAMINATION:    BP 112/72 (BP Location: Right Arm, Patient Position: Sitting, Cuff Size: Normal)   Pulse 88   Resp 16   Wt 219 lb (99.3 kg)   LMP 12/31/1993   BMI 36.44 kg/m     General appearance: alert, cooperative and appears stated age   Pelvic: External genitalia:   Yellow thick discharge.  Perihymenal areas with ecchymoses and thin epithelium.              Urethra:  normal appearing urethra with no masses, tenderness or lesions              Bartholins and Skenes: normal                 Vagina: normal appearing vagina with normal color and discharge, no lesions              Cervix: no  lesions                Bimanual Exam:  Uterus:  normal size, contour, position, consistency, mobility, non-tender              Adnexa: no mass, fullness, tenderness           Chaperone was present for exam.  ASSESSMENT  Lichen sclerosus.  Hemorrhagia vulva. Yeast vulvitis.  Status post recent abx use. Urinary incontinence.  PLAN  Nystatin ointment bid for one week. Has Rx.  Return for vulvar biopsy due to hemorrhagic areas.  Will continue with clobetasol after biopsy is done.  Discussed bladder irritants. Encouraged good sugar control.   An After Visit Summary was printed and given to the patient.  __15____ minutes face to face time of which over 50% was spent in counseling.

## 2017-01-02 ENCOUNTER — Encounter: Payer: Self-pay | Admitting: Obstetrics and Gynecology

## 2017-01-02 ENCOUNTER — Telehealth: Payer: Self-pay | Admitting: Obstetrics and Gynecology

## 2017-01-02 ENCOUNTER — Ambulatory Visit: Payer: Medicare Other | Admitting: Obstetrics and Gynecology

## 2017-01-02 NOTE — Telephone Encounter (Signed)
Spoke with patient. Patient states that has episodes of heightened anxiety. Increased anxiety began last night and has continued this morning. Patient is under management for anxiety with another provider. Advised it is recommended that she contact the provider who is managing her anxiety as it is not safe to be taking Xanax so often with increasing her dosage. Denies any trouble breathing. Patient is coherent and understands Therapist, sports. States anxiety is not about her appointment with our office, but other life situations. Will contact managing physician of anxiety to discuss concerns. Appointment with Dr.Silva rescheduled for 01/09/2017 at 10 am. Patient is agreeable to date and time.  Dr.Silva, anything further for this patient?

## 2017-01-02 NOTE — Telephone Encounter (Signed)
Patient canceled her Vulvar Biopsy appointment today. Patient stated that she is having a severe anxiety and took 4mg  of Xanax and does not feel that's she can drive. Patient would like to reschedule. [facility] policy followed. To triage to reschedule.

## 2017-01-02 NOTE — Telephone Encounter (Signed)
I agree with your recommendations.  Thank you for rescheduling the appointment.  You may close the encounter.

## 2017-01-04 ENCOUNTER — Telehealth: Payer: Self-pay | Admitting: Obstetrics and Gynecology

## 2017-01-04 NOTE — Telephone Encounter (Signed)
Patient canceled her appointment for vulvar biopsy. She states she would like a call next week to reschedule this appointment.

## 2017-01-04 NOTE — Telephone Encounter (Signed)
Phone note sent through to nursing supervisor to contact patient in follow up. Patient has had multiple last minute cancellations for her vulvar biopsy.  She has some very significant vulvar changes from probable lichen sclerosus.  The purpose of the biopsy is also to rule out dysplasia.  Cc- Lamont Snowball

## 2017-01-09 ENCOUNTER — Ambulatory Visit: Payer: Medicare Other | Admitting: Obstetrics and Gynecology

## 2017-01-10 NOTE — Telephone Encounter (Signed)
Follow-up call to patient. Per ROI can leave detailed message on cell number. Left message to call back to clinical supervisor, Gay Filler.

## 2017-01-10 NOTE — Telephone Encounter (Signed)
Patient returned call

## 2017-01-11 NOTE — Telephone Encounter (Signed)
Routing to provider for final review. Patient agreeable to disposition. Will close encounter.     

## 2017-01-11 NOTE — Telephone Encounter (Signed)
Call from patient. Agreeable to reschedule vulvar biopsy. States she had an emergency last week with cold weather. Appointment scheduled for 01-14-17 but patient will need to call back to confirm if she can work this out due to holiday.

## 2017-01-11 NOTE — Telephone Encounter (Signed)
Patient confirmed appointment for Monday, 01/14/17.  Cc: Meredith Hayden

## 2017-01-14 ENCOUNTER — Ambulatory Visit (INDEPENDENT_AMBULATORY_CARE_PROVIDER_SITE_OTHER): Payer: Medicare Other | Admitting: Obstetrics and Gynecology

## 2017-01-14 ENCOUNTER — Encounter: Payer: Self-pay | Admitting: Obstetrics and Gynecology

## 2017-01-14 DIAGNOSIS — N9089 Other specified noninflammatory disorders of vulva and perineum: Secondary | ICD-10-CM

## 2017-01-14 MED ORDER — LIDOCAINE HCL 2 % EX GEL: 1.0000 "application " | CUTANEOUS | 0 refills | Status: DC | PRN

## 2017-01-14 NOTE — Patient Instructions (Signed)

## 2017-01-14 NOTE — Progress Notes (Signed)
Subjective:     Patient ID: Meredith Hayden, female   DOB: 1950-11-01, 67 y.o.   MRN: [id number]  HPI  Patient here today for vulvar biopsy due to hemorrhagic areas. She has history of Lichen Sclerosus. Has been using clobetasol bid most of the time. Wants to decrease clobetasol to once a day during the week and twice a day on weekends.  Has plenty of clobetasol ointment.  No Rx for lidocaine gel.  Notes her urine is dark.   No dysuria. Did not give a urine specimen today.  Takes a diuretic.   Review of Systems  LMP: Hysterectomy Contraception:Hysterectomy     Objective:   Physical Exam  Bilateral labia minora with ecchymotic areas right side greater than left side.  Superior labia minor fusion.  Perineum and perianal region without lesions.   Vulvar Bx.  Consent for procedure.  Betadine prep.  1% lidocaine - lot [id number], expiration 02/21. 4 mm punch biopsy. AgNO3 to area.  Minimal EBL.  No complications.  Tissue to GPA.      Assessment:     Hx lichen sclerosus.  Improved with clobetasol treatment.  Dark urine per patient.    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 Follow up bx result. Rx for Lidocaine 2% jelly to area prn.  Decrease clobetasol use as above.  Goal is to reduce to one application 2 - 3 times weekly. Does not need refill.  We discussed use of Vaseline for hydration to skin of the vulva. She will follow up for annual exam and prn.  Hydrate well and see PCP if urine does not lighten in color.  __15_____ minutes face to face time of which over 50% was spent in counseling.   After visit summary to patient.

## 2017-02-04 ENCOUNTER — Ambulatory Visit: Payer: Medicare Other | Admitting: Family

## 2017-02-05 ENCOUNTER — Other Ambulatory Visit: Payer: Self-pay | Admitting: Family

## 2017-02-05 ENCOUNTER — Encounter: Payer: Self-pay | Admitting: Family

## 2017-02-05 ENCOUNTER — Other Ambulatory Visit (INDEPENDENT_AMBULATORY_CARE_PROVIDER_SITE_OTHER): Payer: Medicare Other

## 2017-02-05 ENCOUNTER — Ambulatory Visit (INDEPENDENT_AMBULATORY_CARE_PROVIDER_SITE_OTHER): Payer: Medicare Other | Admitting: Family

## 2017-02-05 VITALS — BP 124/82 | HR 71 | Temp 99.1°F | Resp 16 | Ht 61.5 in | Wt 219.0 lb

## 2017-02-05 DIAGNOSIS — K580 Irritable bowel syndrome with diarrhea: Secondary | ICD-10-CM | POA: Diagnosis not present

## 2017-02-05 DIAGNOSIS — R3989 Other symptoms and signs involving the genitourinary system: Secondary | ICD-10-CM

## 2017-02-05 DIAGNOSIS — E039 Hypothyroidism, unspecified: Secondary | ICD-10-CM | POA: Diagnosis not present

## 2017-02-05 DIAGNOSIS — K760 Fatty (change of) liver, not elsewhere classified: Secondary | ICD-10-CM

## 2017-02-05 DIAGNOSIS — E11319 Type 2 diabetes mellitus with unspecified diabetic retinopathy without macular edema: Secondary | ICD-10-CM | POA: Diagnosis not present

## 2017-02-05 DIAGNOSIS — R5383 Other fatigue: Secondary | ICD-10-CM | POA: Insufficient documentation

## 2017-02-05 LAB — COMPREHENSIVE METABOLIC PANEL
ALK PHOS: 71 U/L (ref 39–117)
ALT: 18 U/L (ref 0–35)
AST: 23 U/L (ref 0–37)
Albumin: 4.4 g/dL (ref 3.5–5.2)
BILIRUBIN TOTAL: 0.4 mg/dL (ref 0.2–1.2)
BUN: 22 mg/dL (ref 6–23)
CALCIUM: 9.5 mg/dL (ref 8.4–10.5)
CO2: 31 mEq/L (ref 19–32)
CREATININE: 1.37 mg/dL — AB (ref 0.40–1.20)
Chloride: 100 mEq/L (ref 96–112)
GFR: 41.21 mL/min — AB (ref >60.00)
GLUCOSE: 165 mg/dL — AB (ref 70–99)
Potassium: 4.5 mEq/L (ref 3.5–5.1)
Sodium: 138 mEq/L (ref 135–145)
TOTAL PROTEIN: 6.9 g/dL (ref 6.0–8.3)

## 2017-02-05 LAB — TSH: TSH: 4.17 u[IU]/mL (ref 0.35–4.50)

## 2017-02-05 LAB — POCT URINALYSIS DIPSTICK
Bilirubin, UA: NEGATIVE
Blood, UA: NEGATIVE
Glucose, UA: NEGATIVE
Ketones, UA: NEGATIVE
NITRITE UA: POSITIVE
PROTEIN UA: NEGATIVE
Spec Grav, UA: >=1.03
UROBILINOGEN UA: NEGATIVE
pH, UA: 6

## 2017-02-05 LAB — CBC
HCT: 38.2 % (ref 36.0–46.0)
HEMOGLOBIN: 12.8 g/dL (ref 12.0–15.0)
MCHC: 33.4 g/dL (ref 30.0–36.0)
MCV: 92.5 fl (ref 78.0–100.0)
PLATELETS: 272 10*3/uL (ref 150.0–400.0)
RBC: 4.14 Mil/uL (ref 3.87–5.11)
RDW: 12.8 % (ref 11.5–15.5)
WBC: 10.5 10*3/uL (ref 4.0–10.5)

## 2017-02-05 LAB — HEMOGLOBIN A1C: Hgb A1c MFr Bld: 7.1 % — ABNORMAL HIGH (ref 4.6–6.5)

## 2017-02-05 NOTE — Assessment & Plan Note (Addendum)
Increased levels of fatigue in the setting of multiple co-morbidities including diabetes and hypothyroidism. Obtain TSH and A1c to check current status. Cannot rule out medication related or exacerbation of Bipolar. Consider evaluation for sleep apnea if necessary. Continue to monitor pending lab work results.

## 2017-02-05 NOTE — Assessment & Plan Note (Signed)
Generalized abdominal tenderness more than likely associated with irritable bowel syndrome as opposed to fatty liver disease. Obtain CMET and CBC. Recommend probiotic with follow up with gastroenterology if symptoms worsen or do not improve.

## 2017-02-05 NOTE — Progress Notes (Signed)
Subjective:    Patient ID: Meredith Hayden, female    DOB: [birth year], 67 y.o.   MRN: [id number]  Chief Complaint  Patient presents with  . Urine discoloration    has had some urine discoloration, noticed it is very dark in the morning, has fatty liver, has some discomfort in her right side, faigue     HPI:  Meredith Hayden is a 67 y.o. female who  has a past medical history of Allergy; Anxiety; Arthritis; Asthma; Blood transfusion without reported diagnosis (2002); Depression; Diabetes mellitus; Diabetes mellitus type 2 with retinopathy ([place]); Diverticulitis; Dyspareunia; Fibroid; GERD (gastroesophageal reflux disease); Hyperlipidemia; Hypertension; IBS (irritable bowel syndrome); OSA (obstructive sleep apnea); Osteopenia (2009); Sleep apnea; STD (sexually transmitted disease); and Thyroid disease. and presents today for a follow up office visit.   1.) Urinary changes - This is a new problem. Associated symptom of change in urine color and described as dark orange and brown and generally lightens up during the day. No dysuria, urinary frequency, urinary urgency, flank pain, hematuria, or fevers. Drinks about a quart of water per day and hot tea and milk.   2.) Concern for liver function - Previously diagnosed with fatty liver disease and she is currently taking several medications that are metabolized by the liver which she has concerns that there is medication levels building up in her system on occasion. She has also had a searing type pain/pressure located in her right upper quadrant on occasion.. She does have IBS as well. No nausea or vomiting, hematochezia or constipation. Does have diarrhea frequently secondary to her IBS-D.   Lab Results  Component Value Date   TSH 4.17 02/05/2017     Allergies  Allergen Reactions  . Oxycodone-Acetaminophen Anaphylaxis    Patient states that she can regular tylenol without any problems  . Codeine Phosphate     REACTION:  unspecified  . Erythromycin Other (See Comments)    Dizzy and sweaty   . Morphine Sulfate Itching    Tolerated hydromorphone   . Oxycodone Hcl     REACTION: unspecified  . Sulfamethoxazole     REACTION: rash  . Tylenol [Acetaminophen] Other (See Comments)    Fatty liver      Outpatient Medications Prior to Visit  Medication Sig Dispense Refill  . ALPRAZolam (XANAX) 1 MG tablet Take 0.5-2 mg by mouth every 4 (four) hours as needed for anxiety or sleep. Take 1-2 tabs up to 6 times daily    . amphetamine-dextroamphetamine (ADDERALL XR) 20 MG 24 hr capsule Take 20 mg by mouth daily.    . Ascorbic Acid (VITAMIN C) 1000 MG tablet Take 1,000 mg by mouth daily.    Marland Kitchen aspirin 81 MG tablet Take 81 mg by mouth daily.     Marland Kitchen atorvastatin (LIPITOR) 10 MG tablet TAKE 1 TABLET BY MOUTH  DAILY 60 tablet 2  . ATROVENT HFA 17 MCG/ACT inhaler USE 2 PUFFS EVERY 4 HOURS  AS NEEDED FOR WHEEZING. 12.9 g 0  . Blood Glucose Monitoring Suppl (ONETOUCH VERIO) W/DEVICE KIT 1 each by Does not apply route daily. 1 kit 0  . Calcium Carbonate-Vit D-Min (CALCIUM 1200 PO) Take 1 tablet by mouth daily.    . cetirizine (ZYRTEC) 10 MG tablet Take 10 mg by mouth daily.     . Cholecalciferol (VITAMIN D3) 1000 UNITS CAPS Take 1 capsule by mouth daily.      . clobetasol ointment (TEMOVATE) 3.73 % Apply 1 application topically 2 (two) times daily.  Apply as a thin layer to the affected areas. 60 g 1  . co-enzyme Q-10 30 MG capsule Take 30 mg by mouth daily.      . cyanocobalamin 100 MCG tablet Take 100 mcg by mouth daily.      Marland Kitchen desvenlafaxine (PRISTIQ) 100 MG 24 hr tablet Take 100 mg by mouth daily.      . fluticasone furoate-vilanterol (BREO ELLIPTA) 100-25 MCG/INH AEPB Inhale 1 puff into the lungs daily. 28 each 0  . Garlic 638 MG TABS Take 1 tablet by mouth daily.      . Insulin Pen Needle (EASY COMFORT PEN NEEDLES) 31G X 5 MM MISC Use 1x a day 100 each 3  . lamoTRIgine (LAMICTAL) 200 MG tablet Take 300 mg by mouth daily.      Marland Kitchen lidocaine (XYLOCAINE) 2 % jelly Apply 1 application topically as needed. 30 mL 0  . metFORMIN (GLUCOPHAGE-XR) 500 MG 24 hr tablet TAKE 2 TABLETS BY MOUTH 2  TIMES DAILY WITH A MEAL. 120 tablet 1  . nystatin ointment (MYCOSTATIN) Apply 1 application topically 2 (two) times daily. Apply to affected area for up to 7 days. 30 g 0  . Omega-3 350 MG CAPS Take 350 mg by mouth daily.      Marland Kitchen omeprazole (PRILOSEC) 20 MG capsule Take 20 mg by mouth 2 (two) times daily before a meal.     . ONETOUCH DELICA LANCETS 75I MISC USE AS DIRECTED TO CHECK BLOOD SUGAR TWICE DAILY. 100 each 11  . ONETOUCH VERIO test strip CHECK BLOOD SUGAR TWICE DAILY. 100 each 11  . zolpidem (AMBIEN) 10 MG tablet Take 10 mg by mouth at bedtime as needed for sleep.    Marland Kitchen levothyroxine (SYNTHROID, LEVOTHROID) 50 MCG tablet Take 1 tablet by mouth  daily 90 tablet 3  . lisinopril-hydrochlorothiazide (PRINZIDE,ZESTORETIC) 20-12.5 MG tablet TAKE 1 TABLET BY MOUTH  DAILY 90 tablet 0  . metoprolol tartrate (LOPRESSOR) 25 MG tablet TAKE 1 TABLET BY MOUTH TWO  TIMES DAILY 180 tablet 0   Facility-Administered Medications Prior to Visit  Medication Dose Route Frequency Provider Last Rate Last Dose  . 0.9 %  sodium chloride infusion  500 mL Intravenous Continuous Irene Shipper, MD          Past Surgical History:  Procedure Laterality Date  . ABDOMINAL HYSTERECTOMY  1995   TAH/LSO--Dr. Ubaldo Glassing  . [place]  . FOOT SURGERY Bilateral   . NASAL SINUS SURGERY    . OOPHORECTOMY    . PARTIAL COLECTOMY     -RSO  . ROTATOR CUFF REPAIR Left   . TOTAL KNEE ARTHROPLASTY Bilateral 2002, 2004      Past Medical History:  Diagnosis Date  . Allergy   . Anxiety   . Arthritis   . Asthma   . Blood transfusion without reported diagnosis 2002   with knee replacement  . Depression   . Diabetes mellitus   . Diabetes mellitus type 2 with retinopathy ([place])   . Diverticulitis   . Dyspareunia    vaginal dryness  . Fibroid     reason for hysterectomy  . GERD (gastroesophageal reflux disease)   . Hyperlipidemia   . Hypertension   . IBS (irritable bowel syndrome)   . OSA (obstructive sleep apnea)   . Osteopenia 2009  . Sleep apnea   . STD (sexually transmitted disease)    Hx HPV  . Thyroid disease       Review of Systems  Constitutional: Negative for chills and fever.  Gastrointestinal: Positive for abdominal pain and diarrhea. Negative for anal bleeding, blood in stool, constipation, nausea, rectal pain and vomiting.  Genitourinary: Negative for dysuria, flank pain, frequency, hematuria, pelvic pain and urgency.      Objective:    BP 124/82 (BP Location: Left Arm, Patient Position: Sitting, Cuff Size: Large)   Pulse 71   Temp 99.1 F (37.3 C) (Oral)   Resp 16   Ht 5' 1.5" (1.562 m)   Wt 219 lb (99.3 kg)   LMP 12/31/1993   SpO2 96%   BMI 40.71 kg/m  Nursing note and vital signs reviewed.  Physical Exam  Constitutional: She is oriented to person, place, and time. She appears well-developed and well-nourished. No distress.  Cardiovascular: Normal rate, regular rhythm, normal heart sounds and intact distal pulses.   Pulmonary/Chest: Effort normal and breath sounds normal.  Abdominal: Normal appearance and bowel sounds are normal. She exhibits no mass. There is no hepatosplenomegaly. There is generalized tenderness. There is no rigidity, no rebound, no guarding, no CVA tenderness, no tenderness at McBurney's point and negative Murphy's sign.  Neurological: She is alert and oriented to person, place, and time.  Skin: Skin is warm and dry.  Psychiatric: She has a normal mood and affect. Her behavior is normal. Judgment and thought content normal.       Assessment & Plan:   Problem List Items Addressed This Visit      Digestive   IRRITABLE BOWEL SYNDROME   Relevant Orders   Comprehensive metabolic panel (Completed)   Fatty liver disease, nonalcoholic    Generalized abdominal tenderness more  than likely associated with irritable bowel syndrome as opposed to fatty liver disease. Obtain CMET and CBC. Recommend probiotic with follow up with gastroenterology if symptoms worsen or do not improve.       Relevant Orders   CBC (Completed)   Comprehensive metabolic panel (Completed)     Endocrine   Hypothyroidism   Relevant Orders   TSH (Completed)   Type 2 diabetes mellitus with retinopathy, without long-term current use of insulin (HCC)   Relevant Orders   Hemoglobin A1c (Completed)     Other   Urine discoloration - Primary    This is a new problem. In office POCT UA positive for leukocytes and nitrites and negative for hematuria. No other symptoms of urinary tract infection. Urine will be sent for culture. Continue watchful waiting pending urine culture results.       Relevant Orders   Urine culture   POCT urinalysis dipstick (Completed)   Fatigue    Increased levels of fatigue in the setting of multiple co-morbidities including diabetes and hypothyroidism. Obtain TSH and A1c to check current status. Cannot rule out medication related or exacerbation of Bipolar. Consider evaluation for sleep apnea if necessary. Continue to monitor pending lab work results.       Relevant Orders   TSH (Completed)   Hemoglobin A1c (Completed)       I am having Ms. Flath maintain her ALPRAZolam, aspirin, Omega-3, desvenlafaxine, cetirizine, co-enzyme Q-10, cyanocobalamin, Vitamin D3, Garlic, vitamin C, Calcium Carbonate-Vit D-Min (CALCIUM 1200 PO), ONETOUCH VERIO, omeprazole, Insulin Pen Needle, lamoTRIgine, ONETOUCH DELICA LANCETS 34V, ONETOUCH VERIO, zolpidem, fluticasone furoate-vilanterol, ATROVENT HFA, nystatin ointment, clobetasol ointment, metFORMIN, atorvastatin, amphetamine-dextroamphetamine, and lidocaine. We will continue to administer sodium chloride.   ollow-up: Return in about 3 months (around 05/05/2017), or if symptoms worsen or fail to improve.  Mauricio Po, FNP

## 2017-02-05 NOTE — Assessment & Plan Note (Signed)
This is a new problem. In office POCT UA positive for leukocytes and nitrites and negative for hematuria. No other symptoms of urinary tract infection. Urine will be sent for culture. Continue watchful waiting pending urine culture results.

## 2017-02-05 NOTE — Patient Instructions (Signed)
Thank you for choosing [facility].  SUMMARY AND INSTRUCTIONS:  Medication:  Continue to take your medications as prescribed.   Labs:  Please stop by the lab on the lower level of the building for your blood work. Your results will be released to [place] (or called to you) after review, usually within 72 hours after test completion. If any changes need to be made, you will be notified at that same time.  1.) The lab is open from 7:30am to 5:30 pm Monday-Friday 2.) No appointment is necessary 3.) Fasting (if needed) is 6-8 hours after food and drink; black coffee and water are okay   Follow up:  If your symptoms worsen or fail to improve, please contact our office for further instruction, or in case of emergency go directly to the emergency room at the closest medical facility.

## 2017-02-07 ENCOUNTER — Encounter: Payer: Self-pay | Admitting: Family

## 2017-02-07 LAB — URINE CULTURE

## 2017-02-07 MED ORDER — CIPROFLOXACIN HCL 500 MG PO TABS: 500.0000 mg | ORAL_TABLET | Freq: Two times a day (BID) | ORAL | 0 refills | Status: DC

## 2017-02-07 NOTE — Progress Notes (Signed)
Urine culture positive for UTI with susceptibility to Ciprofloxacin. Medication sent to pharmacy.

## 2017-02-20 LAB — HM DIABETES EYE EXAM

## 2017-02-25 ENCOUNTER — Other Ambulatory Visit: Payer: Self-pay | Admitting: Internal Medicine

## 2017-03-06 ENCOUNTER — Encounter: Payer: Self-pay | Admitting: Family

## 2017-03-26 ENCOUNTER — Other Ambulatory Visit: Payer: Self-pay | Admitting: Internal Medicine

## 2017-04-15 ENCOUNTER — Other Ambulatory Visit: Payer: Self-pay | Admitting: Internal Medicine

## 2017-04-15 ENCOUNTER — Other Ambulatory Visit: Payer: Self-pay | Admitting: Family

## 2017-05-01 ENCOUNTER — Telehealth: Payer: Self-pay

## 2017-05-01 ENCOUNTER — Other Ambulatory Visit: Payer: Medicare Other

## 2017-05-01 ENCOUNTER — Ambulatory Visit (INDEPENDENT_AMBULATORY_CARE_PROVIDER_SITE_OTHER): Payer: Medicare Other | Admitting: Internal Medicine

## 2017-05-01 ENCOUNTER — Encounter: Payer: Self-pay | Admitting: Internal Medicine

## 2017-05-01 VITALS — BP 128/80 | HR 86 | Temp 98.2°F | Resp 16 | Ht 61.5 in | Wt 224.5 lb

## 2017-05-01 DIAGNOSIS — R300 Dysuria: Secondary | ICD-10-CM | POA: Insufficient documentation

## 2017-05-01 DIAGNOSIS — R319 Hematuria, unspecified: Secondary | ICD-10-CM | POA: Diagnosis not present

## 2017-05-01 DIAGNOSIS — N3001 Acute cystitis with hematuria: Secondary | ICD-10-CM | POA: Diagnosis not present

## 2017-05-01 LAB — POC URINALSYSI DIPSTICK (AUTOMATED)
BILIRUBIN UA: NEGATIVE
Blood, UA: POSITIVE
GLUCOSE UA: NEGATIVE
KETONES UA: NEGATIVE
Nitrite, UA: POSITIVE
PROTEIN UA: POSITIVE
Spec Grav, UA: 1.025 (ref 1.010–1.025)
Urobilinogen, UA: 0.2 E.U./dL
pH, UA: 6 (ref 5.0–8.0)

## 2017-05-01 MED ORDER — CIPROFLOXACIN HCL 250 MG PO TABS: 250.0000 mg | ORAL_TABLET | Freq: Two times a day (BID) | ORAL | 0 refills | Status: AC

## 2017-05-01 NOTE — Patient Instructions (Signed)

## 2017-05-01 NOTE — Progress Notes (Signed)
Pre visit review using our clinic review tool, if applicable. No additional management support is needed unless otherwise documented below in the visit note. 

## 2017-05-01 NOTE — Progress Notes (Signed)
Subjective:  Patient ID: Meredith Hayden, female    DOB: Feb 12, 1950  Age: 67 y.o. MRN: [id number]  CC: Urinary Tract Infection  New to me for a w/in appt  HPI [name] presents for a 5 day hx of dysuria, bladder pain, LGF, chills, and flank pain.  Outpatient Medications Prior to Visit  Medication Sig Dispense Refill  . ALPRAZolam (XANAX) 1 MG tablet Take 0.5-2 mg by mouth every 4 (four) hours as needed for anxiety or sleep. Take 1-2 tabs up to 6 times daily    . Ascorbic Acid (VITAMIN C) 1000 MG tablet Take 1,000 mg by mouth daily.    Marland Kitchen aspirin 81 MG tablet Take 81 mg by mouth daily.     Marland Kitchen atorvastatin (LIPITOR) 10 MG tablet TAKE 1 TABLET BY MOUTH  DAILY 90 tablet 3  . ATROVENT HFA 17 MCG/ACT inhaler USE 2 PUFFS EVERY 4 HOURS  AS NEEDED FOR WHEEZING. 12.9 g 0  . Blood Glucose Monitoring Suppl (ONETOUCH VERIO) W/DEVICE KIT 1 each by Does not apply route daily. 1 kit 0  . Calcium Carbonate-Vit D-Min (CALCIUM 1200 PO) Take 1 tablet by mouth daily.    . cetirizine (ZYRTEC) 10 MG tablet Take 10 mg by mouth daily.     . Cholecalciferol (VITAMIN D3) 1000 UNITS CAPS Take 1 capsule by mouth daily.      . clobetasol ointment (TEMOVATE) 5.80 % Apply 1 application topically 2 (two) times daily. Apply as a thin layer to the affected areas. 60 g 1  . co-enzyme Q-10 30 MG capsule Take 30 mg by mouth daily.      . cyanocobalamin 100 MCG tablet Take 100 mcg by mouth daily.      Marland Kitchen desvenlafaxine (PRISTIQ) 100 MG 24 hr tablet Take 100 mg by mouth daily.      . fluticasone furoate-vilanterol (BREO ELLIPTA) 100-25 MCG/INH AEPB Inhale 1 puff into the lungs daily. 28 each 0  . Garlic 998 MG TABS Take 1 tablet by mouth daily.      Marland Kitchen lamoTRIgine (LAMICTAL) 200 MG tablet Take 300 mg by mouth daily.     Marland Kitchen levothyroxine (SYNTHROID, LEVOTHROID) 50 MCG tablet TAKE 1 TABLET BY MOUTH  DAILY 90 tablet 0  . lidocaine (XYLOCAINE) 2 % jelly Apply 1 application topically as needed. 30 mL 0  .  lisinopril-hydrochlorothiazide (PRINZIDE,ZESTORETIC) 20-12.5 MG tablet TAKE 1 TABLET BY MOUTH  DAILY 90 tablet 0  . metFORMIN (GLUCOPHAGE-XR) 500 MG 24 hr tablet TAKE 2 TABLETS BY MOUTH 2  TIMES DAILY WITH A MEAL. 240 tablet 1  . metoprolol tartrate (LOPRESSOR) 25 MG tablet TAKE 1 TABLET BY MOUTH TWO  TIMES DAILY 180 tablet 0  . Omega-3 350 MG CAPS Take 350 mg by mouth daily.      Marland Kitchen omeprazole (PRILOSEC) 20 MG capsule Take 20 mg by mouth 2 (two) times daily before a meal.     . ONETOUCH DELICA LANCETS 33A MISC USE AS DIRECTED TO CHECK BLOOD SUGAR TWICE DAILY. 100 each 11  . ONETOUCH VERIO test strip CHECK BLOOD SUGAR TWICE DAILY. 100 each 11  . zolpidem (AMBIEN) 10 MG tablet Take 10 mg by mouth at bedtime as needed for sleep.    Marland Kitchen amphetamine-dextroamphetamine (ADDERALL XR) 20 MG 24 hr capsule Take 20 mg by mouth daily.    . ciprofloxacin (CIPRO) 500 MG tablet Take 1 tablet (500 mg total) by mouth 2 (two) times daily. 6 tablet 0  . Insulin Pen Needle (EASY  COMFORT PEN NEEDLES) 31G X 5 MM MISC Use 1x a day 100 each 3  . nystatin ointment (MYCOSTATIN) Apply 1 application topically 2 (two) times daily. Apply to affected area for up to 7 days. 30 g 0   Facility-Administered Medications Prior to Visit  Medication Dose Route Frequency Provider Last Rate Last Dose  . 0.9 %  sodium chloride infusion  500 mL Intravenous Continuous Irene Shipper, MD        ROS Review of Systems  Constitutional: Positive for chills and fever. Negative for diaphoresis and fatigue.  HENT: Negative.  Negative for sore throat and trouble swallowing.   Eyes: Negative.   Respiratory: Negative.  Negative for cough, chest tightness, shortness of breath and wheezing.   Cardiovascular: Negative for chest pain, palpitations and leg swelling.  Gastrointestinal: Negative for abdominal pain, constipation, diarrhea, nausea and vomiting.  Endocrine: Negative.   Genitourinary: Positive for dysuria, flank pain, frequency and pelvic  pain. Negative for decreased urine volume, difficulty urinating and urgency.  Skin: Negative.   Allergic/Immunologic: Negative.   Neurological: Negative.  Negative for dizziness and weakness.  Hematological: Negative.  Negative for adenopathy.  Psychiatric/Behavioral: Negative.     Objective:  BP 128/80 (BP Location: Left Arm, Patient Position: Sitting, Cuff Size: Normal)   Pulse 86   Temp 98.2 F (36.8 C) (Oral)   Resp 16   Ht 5' 1.5" (1.562 m)   Wt 224 lb 8 oz (101.8 kg)   LMP 12/31/1993   SpO2 98%   BMI 41.73 kg/m   BP Readings from Last 3 Encounters:  05/01/17 128/80  02/05/17 124/82  01/14/17 122/80    Wt Readings from Last 3 Encounters:  05/01/17 224 lb 8 oz (101.8 kg)  02/05/17 219 lb (99.3 kg)  01/14/17 217 lb (98.4 kg)    Physical Exam  Constitutional: She is oriented to person, place, and time.  Non-toxic appearance. She does not have a sickly appearance. She does not appear ill. No distress.  HENT:  Mouth/Throat: Oropharynx is clear and moist. No oropharyngeal exudate.  Eyes: Conjunctivae are normal. Right eye exhibits no discharge. Left eye exhibits no discharge. No scleral icterus.  Neck: Normal range of motion. Neck supple. No JVD present. No tracheal deviation present. No thyromegaly present.  Cardiovascular: Normal rate, regular rhythm, normal heart sounds and intact distal pulses.  Exam reveals no gallop and no friction rub.   No murmur heard. Pulmonary/Chest: Effort normal and breath sounds normal. No stridor. No respiratory distress. She has no wheezes. She has no rales. She exhibits no tenderness.  Abdominal: Soft. Normal appearance and bowel sounds are normal. She exhibits no distension and no mass. There is no hepatosplenomegaly. There is no tenderness. There is no rebound, no guarding and no CVA tenderness.  Musculoskeletal: Normal range of motion. She exhibits no edema or tenderness.  Lymphadenopathy:    She has no cervical adenopathy.    Neurological: She is oriented to person, place, and time.  Skin: Skin is warm and dry. No rash noted. She is not diaphoretic. No erythema. No pallor.  Vitals reviewed.   Lab Results  Component Value Date   WBC 10.5 02/05/2017   HGB 12.8 02/05/2017   HCT 38.2 02/05/2017   PLT 272.0 02/05/2017   GLUCOSE 165 (H) 02/05/2017   CHOL 162 03/06/2016   TRIG 179.0 (H) 03/06/2016   HDL 46.40 03/06/2016   LDLDIRECT 77.1 11/02/2013   LDLCALC 79 03/06/2016   ALT 18 02/05/2017   AST 23  02/05/2017   NA 138 02/05/2017   K 4.5 02/05/2017   CL 100 02/05/2017   CREATININE 1.37 (H) 02/05/2017   BUN 22 02/05/2017   CO2 31 02/05/2017   TSH 4.17 02/05/2017   INR 1.05 01/26/2015   HGBA1C 7.1 (H) 02/05/2017   MICROALBUR 0.2 11/02/2013    Dg Bone Density  Result Date: 03/09/2015 CLINICAL DATA:  67 year old postmenopausal Caucasian female. No personal fragility fracture history. EXAM: DUAL X-RAY ABSORPTIOMETRY (DXA) FOR BONE MINERAL DENSITY FINDINGS: AP LUMBAR SPINE L1, L2, L3 Bone Mineral Density (BMD):  0.848 g/cm2 Young Adult T-Score:  -1.5 Z-Score:  0.0 Left FEMUR neck Bone Mineral Density (BMD):  0.794 G/cm2 Young Adult T-Score: -0.5 Z-Score:  0.9 ASSESSMENT: Patient's diagnostic category is low bone mass by WHO Criteria. FRACTURE RISK: Increased FRAX: Based on the Luther model, the 10 year probability of a major osteoporotic fracture is 6.0%. The 10 year probability of a hip fracture is 0.2%. COMPARISON: None Effective therapies are available in the form of bisphosphonates, selective estrogen receptor modulators, biologic agents, and hormone replacement therapy (for women). All patients should ensure an adequate intake of dietary calcium (1200 mg daily) and vitamin D (800 IU daily) unless contraindicated. All treatment decisions require clinical judgment and consideration of individual patient factors, including patient preferences, co-morbidities, previous drug use, risk factors  not captured in the FRAX model (e.g., frailty, falls, vitamin D deficiency, increased bone turnover, interval significant decline in bone density) and possible under- or over-estimation of fracture risk by FRAX. The National Osteoporosis Foundation recommends that FDA-approved medical therapies be considered in postmenopausal women and men age 72 or older with a: 1. Hip or vertebral (clinical or morphometric) fracture. 2. T-score of -2.5 or lower at the spine or hip. 3. Ten-year fracture probability by FRAX of 3% or greater for hip fracture or 20% or greater for major osteoporotic fracture. People with diagnosed cases of osteoporosis or at high risk for fracture should have regular bone mineral density tests. For patients eligible for Medicare, routine testing is allowed once every 2 years. The testing frequency can be increased to one year for patients who have rapidly progressing disease, those who are receiving or discontinuing medical therapy to restore bone mass, or have additional risk factors. [facility]) Criteria: Normal: T-scores from +1.0 to -1.0 Low Bone Mass (Osteopenia): T-scores between -1.0 and -2.5 Osteoporosis: T-scores -2.5 and below Comparison to Reference Population: T-score is the key measure used in the diagnosis of osteoporosis and relative risk determination for fracture. It provides a value for bone mass relative to the mean bone mass of a young adult reference population expressed in terms of standard deviation (SD). Z-score is the age-matched score showing the patient's values compared to a population matched for age, sex, and race. This is also expressed in terms of standard deviation. The patient may have values that compare favorably to the age-matched values and still be at increased risk for fracture. Electronically Signed   By: Altamese Cabal M.D.   On: 03/09/2015 16:40   Mm Digital Screening Bilateral  Result Date: 03/09/2015 CLINICAL DATA:  Screening.  EXAM: DIGITAL SCREENING BILATERAL MAMMOGRAM WITH CAD COMPARISON:  Previous exam(s). ACR Breast Density Category b: There are scattered areas of fibroglandular density. FINDINGS: There are no findings suspicious for malignancy. Images were processed with CAD. IMPRESSION: No mammographic evidence of malignancy. A result letter of this screening mammogram will be mailed directly to the patient. RECOMMENDATION: Screening mammogram in  one year. (Code:SM-B-01Y) BI-RADS CATEGORY  1: Negative. Electronically Signed   By: Margarette Canada M.D.   On: 03/10/2015 09:33   Urine dipstick shows positive for WBC's, positive for RBC's, positive for nitrates and positive for leukocytes.  Micro exam: not done.   Assessment & Plan:   Doniqua was seen today for urinary tract infection.  Diagnoses and all orders for this visit:  Dysuria- dipstick UA is abnormal, will treat for UTI -     POCT Urinalysis Dipstick (Automated)  Hematuria, unspecified type -     CULTURE, URINE COMPREHENSIVE; Future  Acute cystitis with hematuria- will treat empirically with cipro, will change the antibiotic if indicated based on the urine clx results -     ciprofloxacin (CIPRO) 250 MG tablet; Take 1 tablet (250 mg total) by mouth 2 (two) times daily.   I have discontinued Ms. Guimont Insulin Pen Needle, nystatin ointment, amphetamine-dextroamphetamine, and ciprofloxacin. I am also having her start on ciprofloxacin. Additionally, I am having her maintain her ALPRAZolam, aspirin, Omega-3, desvenlafaxine, cetirizine, co-enzyme Q-10, cyanocobalamin, Vitamin D3, Garlic, vitamin C, Calcium Carbonate-Vit D-Min (CALCIUM 1200 PO), ONETOUCH VERIO, omeprazole, lamoTRIgine, ONETOUCH DELICA LANCETS 50C, ONETOUCH VERIO, zolpidem, fluticasone furoate-vilanterol, ATROVENT HFA, clobetasol ointment, lidocaine, lisinopril-hydrochlorothiazide, metFORMIN, metoprolol tartrate, levothyroxine, and atorvastatin. We will continue to administer sodium chloride.  Meds  ordered this encounter  Medications  . ciprofloxacin (CIPRO) 250 MG tablet    Sig: Take 1 tablet (250 mg total) by mouth 2 (two) times daily.    Dispense:  10 tablet    Refill:  0     Follow-up: No Follow-up on file.  Scarlette Calico, MD

## 2017-05-01 NOTE — Telephone Encounter (Signed)
Error

## 2017-05-03 ENCOUNTER — Encounter: Payer: Self-pay | Admitting: Internal Medicine

## 2017-05-03 LAB — CULTURE, URINE COMPREHENSIVE

## 2017-06-11 ENCOUNTER — Other Ambulatory Visit: Payer: Self-pay | Admitting: Family

## 2017-06-11 ENCOUNTER — Other Ambulatory Visit: Payer: Self-pay | Admitting: Internal Medicine

## 2017-06-21 ENCOUNTER — Ambulatory Visit (INDEPENDENT_AMBULATORY_CARE_PROVIDER_SITE_OTHER): Payer: Medicare Other | Admitting: Internal Medicine

## 2017-06-21 ENCOUNTER — Encounter: Payer: Self-pay | Admitting: Internal Medicine

## 2017-06-21 ENCOUNTER — Ambulatory Visit: Payer: Medicare Other | Admitting: Family

## 2017-06-21 VITALS — BP 138/84 | HR 66 | Temp 98.4°F | Resp 16 | Wt 227.0 lb

## 2017-06-21 DIAGNOSIS — E11319 Type 2 diabetes mellitus with unspecified diabetic retinopathy without macular edema: Secondary | ICD-10-CM | POA: Diagnosis not present

## 2017-06-21 DIAGNOSIS — S91112A Laceration without foreign body of left great toe without damage to nail, initial encounter: Secondary | ICD-10-CM

## 2017-06-21 MED ORDER — DULAGLUTIDE 0.75 MG/0.5ML ~~LOC~~ SOAJ: 0.7500 mg | SUBCUTANEOUS | 5 refills | Status: DC

## 2017-06-21 NOTE — Assessment & Plan Note (Addendum)
Did not tolerate metformin in discontinued it-diarrhea has resolved Likely still needs medication and we discussed options. She does have a follow-up with endocrine, but not until September Trial of Trulicity - prescription sent to pharmacy-may need to change brand is not covered. Endocrine can change medication to her appointment if needed She will start monitoring her sugars more regularly

## 2017-06-21 NOTE — Progress Notes (Signed)
Subjective:    Patient ID: Meredith Hayden, female    DOB: 1950-06-16, 67 y.o.   MRN: [id number]  HPI She is here for an acute visit.   She stumbled while wearing sandals and stubed the base of her left first toe and sliced the bottom of it. The laceration was deep and it did bleed a lot initially. It continued to bleed intermittently for 2-3 days. On occasion will still bleed slightly. She denies any pus discharge. It is still tender and hurts to walk. After doing this for the first time she did a second time when wearing sandals. She will no longer wear sandals and has been wearing sneakers. The pain is getting better. She denies any numbness and tingling in the toe or foot. She denies any fevers or chills. 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   She stopped taking her metformin.  She was having diarrhea and have been foul smelling and oily looking stools.  Her stools are back to normal since stopping the metformin. She has not been checking her sugars regularly, but did check it today and it was 158. She does follow with endocrine-Dr. Cruzita Lederer.  Medications and allergies reviewed with patient and updated if appropriate.  Patient Active Problem List   Diagnosis Date Noted  . Dysuria 05/01/2017  . Hematuria 05/01/2017  . Acute cystitis with hematuria 05/01/2017  . Urine discoloration 02/05/2017  . Fatigue 02/05/2017  . Cough 07/11/2016  . Medicare annual wellness visit, subsequent 03/06/2016  . Routine general medical examination at a health care facility 03/06/2016  . Type 2 diabetes mellitus with retinopathy, without long-term current use of insulin ([place]) 02/21/2016  . Fatty liver disease, nonalcoholic 08/65/7846  . Atypical chest pain 02/21/2015  . PSVT (paroxysmal supraventricular tachycardia) ([place])   . IRRITABLE BOWEL SYNDROME 03/05/2008  . DISORDER, BIPOLAR NEC 09/19/2007  . SLEEP APNEA 09/19/2007  . Hypothyroidism 08/26/2007  .  OSTEOARTHRITIS 08/26/2007  . Hyperlipidemia 10/31/2006  . OBESITY, MORBID 10/31/2006  . ANXIETY 10/31/2006  . Depression 10/31/2006  . Essential hypertension 10/31/2006  . ALLERGIC RHINITIS 10/31/2006  . ASTHMA 10/31/2006  . DIVERTICULOSIS, COLON 10/31/2006  . COLONIC POLYPS 11/29/2005    Current Outpatient Prescriptions on File Prior to Visit  Medication Sig Dispense Refill  . ALPRAZolam (XANAX) 1 MG tablet Take 0.5-2 mg by mouth every 4 (four) hours as needed for anxiety or sleep. Take 1-2 tabs up to 6 times daily    . Ascorbic Acid (VITAMIN C) 1000 MG tablet Take 1,000 mg by mouth daily.    Marland Kitchen aspirin 81 MG tablet Take 81 mg by mouth daily.     Marland Kitchen atorvastatin (LIPITOR) 10 MG tablet TAKE 1 TABLET BY MOUTH  DAILY 90 tablet 3  . ATROVENT HFA 17 MCG/ACT inhaler USE 2 PUFFS EVERY 4 HOURS  AS NEEDED FOR WHEEZING. 12.9 g 0  . Blood Glucose Monitoring Suppl (ONETOUCH VERIO) W/DEVICE KIT 1 each by Does not apply route daily. 1 kit 0  . Calcium Carbonate-Vit D-Min (CALCIUM 1200 PO) Take 1 tablet by mouth daily.    . cetirizine (ZYRTEC) 10 MG tablet Take 10 mg by mouth daily.     . Cholecalciferol (VITAMIN D3) 1000 UNITS CAPS Take 1 capsule by mouth daily.      . clobetasol ointment (TEMOVATE) 9.62 % Apply 1 application topically 2 (two) times daily. Apply as a thin layer to the affected areas. 60 g 1  .  co-enzyme Q-10 30 MG capsule Take 30 mg by mouth daily.      . cyanocobalamin 100 MCG tablet Take 100 mcg by mouth daily.      Marland Kitchen desvenlafaxine (PRISTIQ) 100 MG 24 hr tablet Take 100 mg by mouth daily.      . fluticasone furoate-vilanterol (BREO ELLIPTA) 100-25 MCG/INH AEPB Inhale 1 puff into the lungs daily. 28 each 0  . Garlic 671 MG TABS Take 1 tablet by mouth daily.      Marland Kitchen lamoTRIgine (LAMICTAL) 200 MG tablet Take 300 mg by mouth daily.     Marland Kitchen levothyroxine (SYNTHROID, LEVOTHROID) 50 MCG tablet TAKE 1 TABLET BY MOUTH  DAILY 90 tablet 0  . lidocaine (XYLOCAINE) 2 % jelly Apply 1 application  topically as needed. 30 mL 0  . lisinopril-hydrochlorothiazide (PRINZIDE,ZESTORETIC) 20-12.5 MG tablet TAKE 1 TABLET BY MOUTH  DAILY 90 tablet 1  . metoprolol tartrate (LOPRESSOR) 25 MG tablet TAKE 1 TABLET BY MOUTH TWO  TIMES DAILY 180 tablet 0  . Omega-3 350 MG CAPS Take 350 mg by mouth daily.      Marland Kitchen omeprazole (PRILOSEC) 20 MG capsule Take 20 mg by mouth 2 (two) times daily before a meal.     . ONETOUCH DELICA LANCETS 24P MISC USE AS DIRECTED TO CHECK BLOOD SUGAR TWICE DAILY. 100 each 11  . ONETOUCH VERIO test strip CHECK BLOOD SUGAR TWICE DAILY. 100 each 11  . zolpidem (AMBIEN) 10 MG tablet Take 10 mg by mouth at bedtime as needed for sleep.    . metFORMIN (GLUCOPHAGE-XR) 500 MG 24 hr tablet TAKE 2 TABLETS BY MOUTH 2  TIMES DAILY WITH A MEAL. (Patient not taking: Reported on 06/21/2017) 360 tablet 1  . [DISCONTINUED] azelastine (ASTELIN) 137 MCG/SPRAY nasal spray 1 spray by Nasal route 2 (two) times daily. Use in each nostril as directed     . [DISCONTINUED] budesonide-formoterol (SYMBICORT) 160-4.5 MCG/ACT inhaler Inhale 2 puffs into the lungs 2 (two) times daily.      . [DISCONTINUED] diphenhydrAMINE (SOMINEX) 25 MG tablet Take 25 mg by mouth at bedtime as needed.      . [DISCONTINUED] fluticasone (FLONASE) 50 MCG/ACT nasal spray 2 sprays by Nasal route daily.      . [DISCONTINUED] temazepam (RESTORIL) 30 MG capsule Take 30 mg by mouth at bedtime as needed.     Current Facility-Administered Medications on File Prior to Visit  Medication Dose Route Frequency Provider Last Rate Last Dose  . 0.9 %  sodium chloride infusion  500 mL Intravenous Continuous Irene Shipper, MD        Past Medical History:  Diagnosis Date  . Allergy   . Anxiety   . Arthritis   . Asthma   . Blood transfusion without reported diagnosis 2002   with knee replacement  . Depression   . Diabetes mellitus   . Diabetes mellitus type 2 with retinopathy ([place])   . Diverticulitis   . Dyspareunia    vaginal dryness  .  Fibroid    reason for hysterectomy  . GERD (gastroesophageal reflux disease)   . Hyperlipidemia   . Hypertension   . IBS (irritable bowel syndrome)   . OSA (obstructive sleep apnea)   . Osteopenia 2009  . Sleep apnea   . STD (sexually transmitted disease)    Hx HPV  . Thyroid disease     Past Surgical History:  Procedure Laterality Date  . ABDOMINAL HYSTERECTOMY  1995   TAH/LSO--Dr. Ubaldo Glassing  . CESAREAN SECTION  1978  . FOOT SURGERY Bilateral   . NASAL SINUS SURGERY    . OOPHORECTOMY    . PARTIAL COLECTOMY     -RSO  . ROTATOR CUFF REPAIR Left   . TOTAL KNEE ARTHROPLASTY Bilateral 2002, 2004    Social History   Social History  . Marital status: Married    Spouse name: N/A  . Number of children: 2  . Years of education: 60   Occupational History  . disabled     orthopedic / psych   Social History Main Topics  . Smoking status: Never Smoker  . Smokeless tobacco: Never Used  . Alcohol use No  . Drug use: No  . Sexual activity: No     Comment: TAH   Other Topics Concern  . Not on file   Social History Narrative   Grew up in [place]. Currently resides in resides in a house with her husband. 4 dogs. Fun: Play with your dogs and be with grandchildren, needle work.    Denies any religious beliefs effecting healthcare.        Family History  Problem Relation Age of Onset  . COPD Mother   . Diabetes Mother   . Heart attack Father 34  . Hypertension Father   . Heart attack Sister 92  . Diabetes Sister   . Hypertension Sister   . Diabetes Brother   . Hypertension Sister   . Diabetes Sister   . Hyperlipidemia Unknown        fhx  . Hypertension Unknown        fhx  . Cancer Unknown        ovarian/grandmother  . Ovarian cancer Maternal Aunt   . Diabetes Maternal Aunt   . Stomach cancer Maternal Grandmother   . Liver cancer Maternal Grandmother     Review of Systems  Constitutional: Negative for chills and fever.  Gastrointestinal: Negative for abdominal pain,  diarrhea and nausea.  Skin: Positive for wound (no discharge). Negative for color change.  Neurological: Negative for numbness.       Objective:   Vitals:   06/21/17 1557  BP: 138/84  Pulse: 66  Resp: 16  Temp: 98.4 F (36.9 C)   Filed Weights   06/21/17 1557  Weight: 227 lb (103 kg)   Body mass index is 42.2 kg/m.  Wt Readings from Last 3 Encounters:  06/21/17 227 lb (103 kg)  05/01/17 224 lb 8 oz (101.8 kg)  02/05/17 219 lb (99.3 kg)     Physical Exam  Constitutional: She appears well-developed and well-nourished. No distress.  Skin: She is not diaphoretic.  Left first toe at base on plantar surface healing laceration without discharge or bleeding. Tender to palpation. No surrounding erythema or swelling. Normal sensation throughout the foot and toe. Normal range of motion of toe.           Assessment & Plan:    See Problem List for Assessment and Plan of chronic medical problems.

## 2017-06-21 NOTE — Assessment & Plan Note (Signed)
Healing, no evidence of infection She will monitor closely No treatment needed at this time She will not wear sandals  Call if no improvement

## 2017-06-21 NOTE — Patient Instructions (Signed)
Start trulicity once weekly. This has been sent to your pharmacy.    Monitor your toe and call if there are signs of infection.

## 2017-09-05 ENCOUNTER — Encounter: Payer: Self-pay | Admitting: Family

## 2017-09-05 ENCOUNTER — Ambulatory Visit (INDEPENDENT_AMBULATORY_CARE_PROVIDER_SITE_OTHER): Payer: Medicare Other | Admitting: Family

## 2017-09-05 ENCOUNTER — Other Ambulatory Visit: Payer: Medicare Other

## 2017-09-05 VITALS — BP 116/70 | HR 73 | Temp 98.6°F | Resp 16 | Ht 61.5 in | Wt 224.0 lb

## 2017-09-05 DIAGNOSIS — R300 Dysuria: Secondary | ICD-10-CM

## 2017-09-05 DIAGNOSIS — Z23 Encounter for immunization: Secondary | ICD-10-CM | POA: Diagnosis not present

## 2017-09-05 LAB — POCT URINALYSIS DIPSTICK
BILIRUBIN UA: NEGATIVE
Blood, UA: 10
GLUCOSE UA: NEGATIVE
KETONES UA: NEGATIVE
Nitrite, UA: NEGATIVE
PROTEIN UA: 15
Spec Grav, UA: >=1.03 — AB (ref 1.010–1.025)
Urobilinogen, UA: NEGATIVE E.U./dL — AB
pH, UA: 6 (ref 5.0–8.0)

## 2017-09-05 MED ORDER — NITROFURANTOIN MONOHYD MACRO 100 MG PO CAPS: 100.0000 mg | ORAL_CAPSULE | Freq: Two times a day (BID) | ORAL | 0 refills | Status: DC

## 2017-09-05 NOTE — Patient Instructions (Signed)
Thank you for choosing [facility].  SUMMARY AND INSTRUCTIONS:  Please start taking Macrobid.  Your urine will be sent for culture.  Drink plenty of water.   Medication:  Your prescription(s) have been submitted to your pharmacy or been printed and provided for you. Please take as directed and contact our office if you believe you are having problem(s) with the medication(s) or have any questions.   Follow up:  If your symptoms worsen or fail to improve, please contact our office for further instruction, or in case of emergency go directly to the emergency room at the closest medical facility.    Urinary Tract Infection, Adult A urinary tract infection (UTI) is an infection of any part of the urinary tract. The urinary tract includes the:  Kidneys.  Ureters.  Bladder.  Urethra.  These organs make, store, and get rid of pee (urine) in the body. Follow these instructions at home:  Take over-the-counter and prescription medicines only as told by your doctor.  If you were prescribed an antibiotic medicine, take it as told by your doctor. Do not stop taking the antibiotic even if you start to feel better.  Avoid the following drinks: ? Alcohol. ? Caffeine. ? Tea. ? Carbonated drinks.  Drink enough fluid to keep your pee clear or pale yellow.  Keep all follow-up visits as told by your doctor. This is important.  Make sure to: ? Empty your bladder often and completely. Do not to hold pee for long periods of time. ? Empty your bladder before and after sex. ? Wipe from front to back after a bowel movement if you are female. Use each tissue one time when you wipe. Contact a doctor if:  You have back pain.  You have a fever.  You feel sick to your stomach (nauseous).  You throw up (vomit).  Your symptoms do not get better after 3 days.  Your symptoms go away and then come back. Get help right away if:  You have very bad back pain.  You have very bad  lower belly (abdominal) pain.  You are throwing up and cannot keep down any medicines or water. This information is not intended to replace advice given to you by your health care provider. Make sure you discuss any questions you have with your health care provider. Document Released: 06/04/2008 Document Revised: 05/24/2016 Document Reviewed: 11/07/2015 Elsevier Interactive Patient Education  [employer].

## 2017-09-05 NOTE — Progress Notes (Signed)
Subjective:    Patient ID: Meredith Hayden, female    DOB: November 16, 1950, 67 y.o.   MRN: [id number]  Chief Complaint  Patient presents with  . Dysuria    x5 days, urinary urgency, frequency, bladder spasms, dysuria    HPI:  Meredith Hayden is a 67 y.o. female who  has a past medical history of Allergy; Anxiety; Arthritis; Asthma; Blood transfusion without reported diagnosis (2002); Depression; Diabetes mellitus; Diabetes mellitus type 2 with retinopathy ([place]); Diverticulitis; Dyspareunia; Fibroid; GERD (gastroesophageal reflux disease); Hyperlipidemia; Hypertension; IBS (irritable bowel syndrome); OSA (obstructive sleep apnea); Osteopenia (2009); Sleep apnea; STD (sexually transmitted disease); and Thyroid disease. and presents today for an acute office visit.  This is a new problem. Associated symptom of urinary frequency, urgency, dysuria and bladder spasms have been going on for about 5 days. Denies fevers, chills or flank pain. Denies any modifying factors or attempted treatments. Symptoms have worsened since initial onset.    Allergies  Allergen Reactions  . Oxycodone-Acetaminophen Anaphylaxis    Patient states that she can regular tylenol without any problems  . Codeine Phosphate     REACTION: unspecified  . Erythromycin Other (See Comments)    Dizzy and sweaty   . Morphine Sulfate Itching    Tolerated hydromorphone   . Oxycodone Hcl     REACTION: unspecified  . Sulfamethoxazole     REACTION: rash  . Tylenol [Acetaminophen] Other (See Comments)    Fatty liver  . Metformin And Related Diarrhea      Outpatient Medications Prior to Visit  Medication Sig Dispense Refill  . ALPRAZolam (XANAX) 1 MG tablet Take 0.5-2 mg by mouth every 4 (four) hours as needed for anxiety or sleep. Take 1-2 tabs up to 6 times daily    . Ascorbic Acid (VITAMIN C) 1000 MG tablet Take 1,000 mg by mouth daily.    Marland Kitchen aspirin 81 MG tablet Take 81 mg by mouth daily.     Marland Kitchen atorvastatin  (LIPITOR) 10 MG tablet TAKE 1 TABLET BY MOUTH  DAILY 90 tablet 3  . ATROVENT HFA 17 MCG/ACT inhaler USE 2 PUFFS EVERY 4 HOURS  AS NEEDED FOR WHEEZING. 12.9 g 0  . Blood Glucose Monitoring Suppl (ONETOUCH VERIO) W/DEVICE KIT 1 each by Does not apply route daily. 1 kit 0  . Calcium Carbonate-Vit D-Min (CALCIUM 1200 PO) Take 1 tablet by mouth daily.    . cetirizine (ZYRTEC) 10 MG tablet Take 10 mg by mouth daily.     . Cholecalciferol (VITAMIN D3) 1000 UNITS CAPS Take 1 capsule by mouth daily.      . clobetasol ointment (TEMOVATE) 2.87 % Apply 1 application topically 2 (two) times daily. Apply as a thin layer to the affected areas. 60 g 1  . co-enzyme Q-10 30 MG capsule Take 30 mg by mouth daily.      . cyanocobalamin 100 MCG tablet Take 100 mcg by mouth daily.      Marland Kitchen desvenlafaxine (PRISTIQ) 100 MG 24 hr tablet Take 100 mg by mouth daily.      . fluticasone furoate-vilanterol (BREO ELLIPTA) 100-25 MCG/INH AEPB Inhale 1 puff into the lungs daily. 28 each 0  . Garlic 681 MG TABS Take 1 tablet by mouth daily.      Marland Kitchen lamoTRIgine (LAMICTAL) 200 MG tablet Take 200 mg by mouth daily.     Marland Kitchen levothyroxine (SYNTHROID, LEVOTHROID) 50 MCG tablet TAKE 1 TABLET BY MOUTH  DAILY 90 tablet 0  . lidocaine (XYLOCAINE) 2 %  jelly Apply 1 application topically as needed. 30 mL 0  . lisinopril-hydrochlorothiazide (PRINZIDE,ZESTORETIC) 20-12.5 MG tablet TAKE 1 TABLET BY MOUTH  DAILY 90 tablet 1  . metoprolol tartrate (LOPRESSOR) 25 MG tablet TAKE 1 TABLET BY MOUTH TWO  TIMES DAILY 180 tablet 0  . Omega-3 350 MG CAPS Take 350 mg by mouth daily.      Marland Kitchen omeprazole (PRILOSEC) 20 MG capsule Take 20 mg by mouth 2 (two) times daily before a meal.     . ONETOUCH DELICA LANCETS 11H MISC USE AS DIRECTED TO CHECK BLOOD SUGAR TWICE DAILY. 100 each 11  . ONETOUCH VERIO test strip CHECK BLOOD SUGAR TWICE DAILY. 100 each 11  . zolpidem (AMBIEN) 10 MG tablet Take 10 mg by mouth at bedtime as needed for sleep.    . Dulaglutide  (TRULICITY) 4.17 EY/8.1KG SOPN Inject 0.75 mg into the skin once a week. (Patient not taking: Reported on 09/05/2017) 4 pen 5   Facility-Administered Medications Prior to Visit  Medication Dose Route Frequency Provider Last Rate Last Dose  . 0.9 %  sodium chloride infusion  500 mL Intravenous Continuous Irene Shipper, MD         Past Medical History:  Diagnosis Date  . Allergy   . Anxiety   . Arthritis   . Asthma   . Blood transfusion without reported diagnosis 2002   with knee replacement  . Depression   . Diabetes mellitus   . Diabetes mellitus type 2 with retinopathy ([place])   . Diverticulitis   . Dyspareunia    vaginal dryness  . Fibroid    reason for hysterectomy  . GERD (gastroesophageal reflux disease)   . Hyperlipidemia   . Hypertension   . IBS (irritable bowel syndrome)   . OSA (obstructive sleep apnea)   . Osteopenia 2009  . Sleep apnea   . STD (sexually transmitted disease)    Hx HPV  . Thyroid disease       Review of Systems  Constitutional: Negative for chills and fever.  Respiratory: Negative for chest tightness and shortness of breath.   Genitourinary: Positive for dysuria, frequency, hematuria and urgency. Negative for flank pain.      Objective:    BP 116/70 (BP Location: Left Arm, Patient Position: Sitting, Cuff Size: Large)   Pulse 73   Temp 98.6 F (37 C) (Oral)   Resp 16   Ht 5' 1.5" (1.562 m)   Wt 224 lb (101.6 kg)   LMP 12/31/1993   SpO2 95%   BMI 41.64 kg/m  Nursing note and vital signs reviewed.  Physical Exam  Constitutional: She is oriented to person, place, and time. She appears well-developed and well-nourished. No distress.  Cardiovascular: Normal rate, regular rhythm, normal heart sounds and intact distal pulses.   Pulmonary/Chest: Effort normal and breath sounds normal.  Abdominal: There is no CVA tenderness.  Neurological: She is alert and oriented to person, place, and time.  Skin: Skin is warm and dry.  Psychiatric: She  has a normal mood and affect. Her behavior is normal. Judgment and thought content normal.       Assessment & Plan:   Problem List Items Addressed This Visit      Other   Dysuria - Primary    New-onset dysuria with in office urinalysis positive for leukocytes and hematuria with no nitrites. Consistent with urinary tract infection. Urine to be sent for culture for confirmation. Start Macrobid. Encourage drink plenty of fluids. Follow-up pending blood  work or if symptoms worsen or do not improve.      Relevant Orders   POCT urinalysis dipstick (Completed)   Urine Culture    Other Visit Diagnoses    Need for influenza vaccination       Relevant Orders   Flu vaccine HIGH DOSE PF (Fluzone High dose) (Completed)       I am having Ms. Meigs start on nitrofurantoin (macrocrystal-monohydrate). I am also having her maintain her ALPRAZolam, aspirin, Omega-3, desvenlafaxine, cetirizine, co-enzyme Q-10, cyanocobalamin, Vitamin D3, Garlic, vitamin C, Calcium Carbonate-Vit D-Min (CALCIUM 1200 PO), ONETOUCH VERIO, omeprazole, lamoTRIgine, ONETOUCH DELICA LANCETS 40J, ONETOUCH VERIO, zolpidem, fluticasone furoate-vilanterol, ATROVENT HFA, clobetasol ointment, lidocaine, metoprolol tartrate, levothyroxine, atorvastatin, lisinopril-hydrochlorothiazide, Dulaglutide, and carbamazepine. We will continue to administer sodium chloride.   Meds ordered this encounter  Medications  . carbamazepine (TEGRETOL XR) 100 MG 12 hr tablet    Sig: Take 100 mg by mouth 2 (two) times daily.  . nitrofurantoin, macrocrystal-monohydrate, (MACROBID) 100 MG capsule    Sig: Take 1 capsule (100 mg total) by mouth 2 (two) times daily.    Dispense:  14 capsule    Refill:  0    Order Specific Question:   Supervising Provider    Answer:   Pricilla Holm A [[id number]]     Follow-up: Return if symptoms worsen or fail to improve.  Mauricio Po, FNP

## 2017-09-05 NOTE — Assessment & Plan Note (Signed)
New-onset dysuria with in office urinalysis positive for leukocytes and hematuria with no nitrites. Consistent with urinary tract infection. Urine to be sent for culture for confirmation. Start Macrobid. Encourage drink plenty of fluids. Follow-up pending blood work or if symptoms worsen or do not improve.

## 2017-09-06 ENCOUNTER — Other Ambulatory Visit: Payer: Self-pay | Admitting: Family

## 2017-09-08 ENCOUNTER — Encounter: Payer: Self-pay | Admitting: Family

## 2017-09-08 LAB — URINE CULTURE
MICRO NUMBER: 80979434
SPECIMEN QUALITY:: ADEQUATE

## 2017-09-08 MED ORDER — CIPROFLOXACIN HCL 500 MG PO TABS: 500.0000 mg | ORAL_TABLET | Freq: Two times a day (BID) | ORAL | 0 refills | Status: DC

## 2017-09-09 MED ORDER — PHENAZOPYRIDINE HCL 100 MG PO TABS: 100.0000 mg | ORAL_TABLET | Freq: Three times a day (TID) | ORAL | 0 refills | Status: DC | PRN

## 2017-09-10 ENCOUNTER — Other Ambulatory Visit: Payer: Self-pay | Admitting: Family

## 2017-09-19 ENCOUNTER — Telehealth: Payer: Self-pay | Admitting: Internal Medicine

## 2017-09-19 ENCOUNTER — Ambulatory Visit: Payer: Medicare Other | Admitting: Internal Medicine

## 2017-09-19 NOTE — Telephone Encounter (Signed)
Patient states that she is unable to make it to her 9:15 appointment. She states that she was up all night with a panic attack. She would like to reschedule. Please call.

## 2017-10-29 DIAGNOSIS — M25571 Pain in right ankle and joints of right foot: Secondary | ICD-10-CM | POA: Diagnosis not present

## 2017-11-06 ENCOUNTER — Ambulatory Visit: Payer: Medicare Other | Admitting: Obstetrics and Gynecology

## 2017-11-08 ENCOUNTER — Ambulatory Visit: Payer: Medicare Other | Admitting: Podiatry

## 2017-11-13 ENCOUNTER — Other Ambulatory Visit: Payer: Self-pay | Admitting: *Deleted

## 2017-12-12 ENCOUNTER — Encounter: Payer: Self-pay | Admitting: Internal Medicine

## 2017-12-12 ENCOUNTER — Ambulatory Visit: Payer: Medicare Other | Admitting: Internal Medicine

## 2017-12-16 ENCOUNTER — Ambulatory Visit: Payer: Medicare Other | Admitting: Internal Medicine

## 2017-12-17 ENCOUNTER — Ambulatory Visit (INDEPENDENT_AMBULATORY_CARE_PROVIDER_SITE_OTHER): Payer: Medicare Other | Admitting: Internal Medicine

## 2017-12-17 ENCOUNTER — Encounter: Payer: Self-pay | Admitting: Internal Medicine

## 2017-12-17 ENCOUNTER — Telehealth: Payer: Self-pay | Admitting: Internal Medicine

## 2017-12-17 ENCOUNTER — Other Ambulatory Visit (INDEPENDENT_AMBULATORY_CARE_PROVIDER_SITE_OTHER): Payer: Medicare Other

## 2017-12-17 VITALS — BP 138/90 | HR 73 | Temp 99.4°F | Ht 61.5 in | Wt 227.0 lb

## 2017-12-17 DIAGNOSIS — E785 Hyperlipidemia, unspecified: Secondary | ICD-10-CM

## 2017-12-17 DIAGNOSIS — E11319 Type 2 diabetes mellitus with unspecified diabetic retinopathy without macular edema: Secondary | ICD-10-CM

## 2017-12-17 DIAGNOSIS — N183 Chronic kidney disease, stage 3 unspecified: Secondary | ICD-10-CM

## 2017-12-17 DIAGNOSIS — I10 Essential (primary) hypertension: Secondary | ICD-10-CM | POA: Diagnosis not present

## 2017-12-17 DIAGNOSIS — E039 Hypothyroidism, unspecified: Secondary | ICD-10-CM

## 2017-12-17 LAB — CBC WITH DIFFERENTIAL/PLATELET
BASOS ABS: 0 10*3/uL (ref 0.0–0.1)
Basophils Relative: 0.3 % (ref 0.0–3.0)
EOS PCT: 3.6 % (ref 0.0–5.0)
Eosinophils Absolute: 0.3 10*3/uL (ref 0.0–0.7)
HCT: 41 % (ref 36.0–46.0)
Hemoglobin: 13.7 g/dL (ref 12.0–15.0)
LYMPHS ABS: 3 10*3/uL (ref 0.7–4.0)
Lymphocytes Relative: 34.8 % (ref 12.0–46.0)
MCHC: 33.3 g/dL (ref 30.0–36.0)
MCV: 91.8 fl (ref 78.0–100.0)
MONOS PCT: 6.8 % (ref 3.0–12.0)
Monocytes Absolute: 0.6 10*3/uL (ref 0.1–1.0)
NEUTROS ABS: 4.7 10*3/uL (ref 1.4–7.7)
NEUTROS PCT: 54.5 % (ref 43.0–77.0)
PLATELETS: 261 10*3/uL (ref 150.0–400.0)
RBC: 4.46 Mil/uL (ref 3.87–5.11)
RDW: 12.9 % (ref 11.5–15.5)
WBC: 8.5 10*3/uL (ref 4.0–10.5)

## 2017-12-17 LAB — MICROALBUMIN / CREATININE URINE RATIO
CREATININE, U: 127.6 mg/dL
MICROALB UR: 1.3 mg/dL (ref 0.0–1.9)
Microalb Creat Ratio: 1 mg/g (ref 0.0–30.0)

## 2017-12-17 LAB — URINALYSIS, ROUTINE W REFLEX MICROSCOPIC
BILIRUBIN URINE: NEGATIVE
KETONES UR: NEGATIVE
Nitrite: NEGATIVE
Specific Gravity, Urine: 1.02 (ref 1.000–1.030)
Total Protein, Urine: NEGATIVE
URINE GLUCOSE: 100 — AB
UROBILINOGEN UA: 0.2 (ref 0.0–1.0)
pH: 5 (ref 5.0–8.0)

## 2017-12-17 LAB — COMPREHENSIVE METABOLIC PANEL
ALT: 37 U/L — ABNORMAL HIGH (ref 0–35)
AST: 45 U/L — ABNORMAL HIGH (ref 0–37)
Albumin: 4.3 g/dL (ref 3.5–5.2)
Alkaline Phosphatase: 111 U/L (ref 39–117)
BUN: 23 mg/dL (ref 6–23)
CHLORIDE: 96 meq/L (ref 96–112)
CO2: 29 meq/L (ref 19–32)
Calcium: 9.3 mg/dL (ref 8.4–10.5)
Creatinine, Ser: 1.45 mg/dL — ABNORMAL HIGH (ref 0.40–1.20)
GFR: 38.49 mL/min — AB (ref >60.00)
GLUCOSE: 277 mg/dL — AB (ref 70–99)
POTASSIUM: 4.6 meq/L (ref 3.5–5.1)
Sodium: 133 mEq/L — ABNORMAL LOW (ref 135–145)
Total Bilirubin: 0.6 mg/dL (ref 0.2–1.2)
Total Protein: 7.4 g/dL (ref 6.0–8.3)

## 2017-12-17 LAB — LIPID PANEL
CHOL/HDL RATIO: 4
CHOLESTEROL: 196 mg/dL (ref 0–200)
HDL: 47.6 mg/dL (ref >39.00)
NonHDL: 148.3
TRIGLYCERIDES: 228 mg/dL — AB (ref 0.0–149.0)
VLDL: 45.6 mg/dL — ABNORMAL HIGH (ref 0.0–40.0)

## 2017-12-17 LAB — TSH: TSH: 2.43 u[IU]/mL (ref 0.35–4.50)

## 2017-12-17 LAB — LDL CHOLESTEROL, DIRECT: Direct LDL: 134 mg/dL

## 2017-12-17 MED ORDER — GLUCOSE BLOOD VI STRP: ORAL_STRIP | 11 refills | Status: DC

## 2017-12-17 MED ORDER — INSULIN LISPRO 100 UNIT/ML (KWIKPEN): 10.0000 [IU] | PEN_INJECTOR | Freq: Three times a day (TID) | SUBCUTANEOUS | 11 refills | Status: DC

## 2017-12-17 MED ORDER — ONETOUCH DELICA LANCETS 33G MISC: 1.0000 | Freq: Three times a day (TID) | 11 refills | Status: DC

## 2017-12-17 MED ORDER — INSULIN GLARGINE 300 UNIT/ML ~~LOC~~ SOPN
60.0000 [IU] | PEN_INJECTOR | Freq: Every day | SUBCUTANEOUS | 5 refills | Status: DC
Start: 2017-12-17 — End: 2018-06-05

## 2017-12-17 NOTE — Progress Notes (Signed)
Subjective:  Patient ID: Meredith Hayden, female    DOB: 1950-01-22  Age: 67 y.o. MRN: [id number]  CC: Diabetes   HPI Meredith Hayden presents for f/up - She is concerned about hyperglycemia.  For the last few weeks her blood sugar has consistently been above 250 and usually above 300.  She complains of increased thirst and decreased appetite.  She tried Trulicity but it caused abdominal pain.  She tried metformin but it caused diarrhea.  She previously used Lantus but recently has not been taking anything to control her blood sugars.  Outpatient Medications Prior to Visit  Medication Sig Dispense Refill  . Ascorbic Acid (VITAMIN C) 1000 MG tablet Take 1,000 mg by mouth daily.    Marland Kitchen aspirin 81 MG tablet Take 81 mg by mouth daily.     . ATROVENT HFA 17 MCG/ACT inhaler USE 2 PUFFS EVERY 4 HOURS  AS NEEDED FOR WHEEZING. 12.9 g 0  . Blood Glucose Monitoring Suppl (ONETOUCH VERIO) W/DEVICE KIT 1 each by Does not apply route daily. 1 kit 0  . Calcium Carbonate-Vit D-Min (CALCIUM 1200 PO) Take 1 tablet by mouth daily.    . cetirizine (ZYRTEC) 10 MG tablet Take 10 mg by mouth daily.     . Cholecalciferol (VITAMIN D3) 1000 UNITS CAPS Take 1 capsule by mouth daily.      . cyanocobalamin 100 MCG tablet Take 100 mcg by mouth daily.      Marland Kitchen desvenlafaxine (PRISTIQ) 100 MG 24 hr tablet Take 100 mg by mouth daily.      Marland Kitchen lamoTRIgine (LAMICTAL) 200 MG tablet Take 200 mg by mouth daily.     Marland Kitchen levothyroxine (SYNTHROID, LEVOTHROID) 50 MCG tablet TAKE 1 TABLET BY MOUTH  DAILY 90 tablet 0  . lisinopril-hydrochlorothiazide (PRINZIDE,ZESTORETIC) 20-12.5 MG tablet TAKE 1 TABLET BY MOUTH  DAILY 90 tablet 1  . metoprolol tartrate (LOPRESSOR) 25 MG tablet TAKE 1 TABLET BY MOUTH TWO  TIMES DAILY 180 tablet 0  . Omega-3 350 MG CAPS Take 350 mg by mouth daily.      Marland Kitchen omeprazole (PRILOSEC) 20 MG capsule Take 20 mg by mouth 2 (two) times daily before a meal.     . zolpidem (AMBIEN) 10 MG tablet Take 10 mg by  mouth at bedtime as needed for sleep.    Marland Kitchen ALPRAZolam (XANAX) 1 MG tablet Take 0.5-2 mg by mouth every 4 (four) hours as needed for anxiety or sleep. Take 1-2 tabs up to 6 times daily    . atorvastatin (LIPITOR) 10 MG tablet TAKE 1 TABLET BY MOUTH  DAILY 90 tablet 3  . carbamazepine (TEGRETOL XR) 100 MG 12 hr tablet Take 100 mg by mouth 2 (two) times daily.    . ciprofloxacin (CIPRO) 500 MG tablet Take 1 tablet (500 mg total) by mouth 2 (two) times daily. 6 tablet 0  . clobetasol ointment (TEMOVATE) 2.54 % Apply 1 application topically 2 (two) times daily. Apply as a thin layer to the affected areas. 60 g 1  . co-enzyme Q-10 30 MG capsule Take 30 mg by mouth daily.      . Dulaglutide (TRULICITY) 2.70 WC/3.7SE SOPN Inject 0.75 mg into the skin once a week. 4 pen 5  . fluticasone furoate-vilanterol (BREO ELLIPTA) 100-25 MCG/INH AEPB Inhale 1 puff into the lungs daily. 28 each 0  . Garlic 831 MG TABS Take 1 tablet by mouth daily.      Marland Kitchen lidocaine (XYLOCAINE) 2 % jelly Apply 1 application topically  as needed. 30 mL 0  . nitrofurantoin, macrocrystal-monohydrate, (MACROBID) 100 MG capsule Take 1 capsule (100 mg total) by mouth 2 (two) times daily. 14 capsule 0  . ONETOUCH DELICA LANCETS 14G MISC USE AS DIRECTED TO CHECK BLOOD SUGAR TWICE DAILY. 100 each 11  . ONETOUCH VERIO test strip CHECK BLOOD SUGAR TWICE DAILY. 100 each 11  . phenazopyridine (PYRIDIUM) 100 MG tablet Take 1 tablet (100 mg total) by mouth 3 (three) times daily as needed for pain. 20 tablet 0  . 0.9 %  sodium chloride infusion      No facility-administered medications prior to visit.     ROS Review of Systems  Constitutional: Negative for appetite change, chills, diaphoresis, fatigue and fever.  HENT: Negative.  Negative for facial swelling, sore throat and trouble swallowing.   Eyes: Negative for visual disturbance.  Respiratory: Negative for cough, chest tightness, shortness of breath and wheezing.   Cardiovascular: Negative  for chest pain, palpitations and leg swelling.  Gastrointestinal: Negative for abdominal pain, diarrhea, nausea and vomiting.  Endocrine: Positive for polydipsia and polyuria. Negative for polyphagia.  Genitourinary: Negative.  Negative for decreased urine volume, difficulty urinating, dysuria, flank pain, frequency, hematuria and urgency.  Musculoskeletal: Negative.  Negative for back pain and myalgias.  Skin: Negative.  Negative for color change and rash.  Neurological: Negative.  Negative for dizziness, weakness, light-headedness and numbness.  Hematological: Negative for adenopathy. Does not bruise/bleed easily.  Psychiatric/Behavioral: Negative.     Objective:  BP 138/90 (BP Location: Left Arm, Patient Position: Sitting, Cuff Size: Large)   Pulse 73   Temp 99.4 F (37.4 C) (Oral)   Ht 5' 1.5" (1.562 m)   Wt 227 lb (103 kg)   LMP 12/31/1993   SpO2 97%   BMI 42.20 kg/m   BP Readings from Last 3 Encounters:  12/17/17 138/90  09/05/17 116/70  06/21/17 138/84    Wt Readings from Last 3 Encounters:  12/17/17 227 lb (103 kg)  09/05/17 224 lb (101.6 kg)  06/21/17 227 lb (103 kg)    Physical Exam  Constitutional: She is oriented to person, place, and time. No distress.  HENT:  Mouth/Throat: Oropharynx is clear and moist. No oropharyngeal exudate.  Eyes: Conjunctivae are normal. Left eye exhibits no discharge.  Neck: Normal range of motion. Neck supple. No JVD present. No thyromegaly present.  Cardiovascular: Normal rate, regular rhythm and normal heart sounds.  No murmur heard. Pulmonary/Chest: Effort normal and breath sounds normal. She has no wheezes. She has no rales.  Abdominal: Soft. Bowel sounds are normal. She exhibits no distension and no mass. There is no rebound and no guarding.  Musculoskeletal: Normal range of motion. She exhibits no edema or tenderness.  Lymphadenopathy:    She has no cervical adenopathy.  Neurological: She is alert and oriented to person,  place, and time.  Skin: Skin is warm and dry. No rash noted. She is not diaphoretic. No erythema. No pallor.  Vitals reviewed.   Lab Results  Component Value Date   WBC 8.5 12/17/2017   HGB 13.7 12/17/2017   HCT 41.0 12/17/2017   PLT 261.0 12/17/2017   GLUCOSE 277 (H) 12/17/2017   CHOL 196 12/17/2017   TRIG 228.0 (H) 12/17/2017   HDL 47.60 12/17/2017   LDLDIRECT 134.0 12/17/2017   LDLCALC 79 03/06/2016   ALT 37 (H) 12/17/2017   AST 45 (H) 12/17/2017   NA 133 (L) 12/17/2017   K 4.6 12/17/2017   CL 96 12/17/2017  CREATININE 1.45 (H) 12/17/2017   BUN 23 12/17/2017   CO2 29 12/17/2017   TSH 2.43 12/17/2017   INR 1.05 01/26/2015   HGBA1C 12.0 12/17/2017   MICROALBUR 1.3 12/17/2017    Dg Bone Density  Result Date: 03/09/2015 CLINICAL DATA:  68 year old postmenopausal Caucasian female. No personal fragility fracture history. EXAM: DUAL X-RAY ABSORPTIOMETRY (DXA) FOR BONE MINERAL DENSITY FINDINGS: AP LUMBAR SPINE L1, L2, L3 Bone Mineral Density (BMD):  0.848 g/cm2 Young Adult T-Score:  -1.5 Z-Score:  0.0 Left FEMUR neck Bone Mineral Density (BMD):  0.794 G/cm2 Young Adult T-Score: -0.5 Z-Score:  0.9 ASSESSMENT: Patient's diagnostic category is low bone mass by WHO Criteria. FRACTURE RISK: Increased FRAX: Based on the Kirkman model, the 10 year probability of a major osteoporotic fracture is 6.0%. The 10 year probability of a hip fracture is 0.2%. COMPARISON: None Effective therapies are available in the form of bisphosphonates, selective estrogen receptor modulators, biologic agents, and hormone replacement therapy (for women). All patients should ensure an adequate intake of dietary calcium (1200 mg daily) and vitamin D (800 IU daily) unless contraindicated. All treatment decisions require clinical judgment and consideration of individual patient factors, including patient preferences, co-morbidities, previous drug use, risk factors not captured in the FRAX model  (e.g., frailty, falls, vitamin D deficiency, increased bone turnover, interval significant decline in bone density) and possible under- or over-estimation of fracture risk by FRAX. The National Osteoporosis Foundation recommends that FDA-approved medical therapies be considered in postmenopausal women and men age 94 or older with a: 1. Hip or vertebral (clinical or morphometric) fracture. 2. T-score of -2.5 or lower at the spine or hip. 3. Ten-year fracture probability by FRAX of 3% or greater for hip fracture or 20% or greater for major osteoporotic fracture. People with diagnosed cases of osteoporosis or at high risk for fracture should have regular bone mineral density tests. For patients eligible for Medicare, routine testing is allowed once every 2 years. The testing frequency can be increased to one year for patients who have rapidly progressing disease, those who are receiving or discontinuing medical therapy to restore bone mass, or have additional risk factors. [facility] [employer] [facility]) Criteria: Normal: T-scores from +1.0 to -1.0 Low Bone Mass (Osteopenia): T-scores between -1.0 and -2.5 Osteoporosis: T-scores -2.5 and below Comparison to Reference Population: T-score is the key measure used in the diagnosis of osteoporosis and relative risk determination for fracture. It provides a value for bone mass relative to the mean bone mass of a young adult reference population expressed in terms of standard deviation (SD). Z-score is the age-matched score showing the patient's values compared to a population matched for age, sex, and race. This is also expressed in terms of standard deviation. The patient may have values that compare favorably to the age-matched values and still be at increased risk for fracture. Electronically Signed   By: Altamese Cabal M.D.   On: 03/09/2015 16:40   Mm Digital Screening Bilateral  Result Date: 03/09/2015 CLINICAL DATA:  Screening. EXAM: DIGITAL SCREENING BILATERAL  MAMMOGRAM WITH CAD COMPARISON:  Previous exam(s). ACR Breast Density Category b: There are scattered areas of fibroglandular density. FINDINGS: There are no findings suspicious for malignancy. Images were processed with CAD. IMPRESSION: No mammographic evidence of malignancy. A result letter of this screening mammogram will be mailed directly to the patient. RECOMMENDATION: Screening mammogram in one year. (Code:SM-B-01Y) BI-RADS CATEGORY  1: Negative. Electronically Signed   By: Cleatis Polka.D.  On: 03/10/2015 09:33    Assessment & Plan:   Romey was seen today for diabetes.  Diagnoses and all orders for this visit:  Type 2 diabetes mellitus with retinopathy of both eyes, without long-term current use of insulin, macular edema presence unspecified, unspecified retinopathy severity ([place])- Her A1c is up to 12%.  She is glucose toxic but there is no evidence of DKA or hyperosmolar coma.  She is intolerant to metformin and GLP-1 agonists.  Will treat the hyperglycemia with aggressive insulin therapy.  I have asked her to follow-up with endocrinology and to undergo diabetic education. -     POCT glycosylated hemoglobin (Hb A1C) -     POCT Glucose (Device for Home Use) -     Comprehensive metabolic panel; Future -     Microalbumin / creatinine urine ratio; Future -     Insulin Glargine (TOUJEO SOLOSTAR) 300 UNIT/ML SOPN; Inject 60 Units into the skin daily. -     insulin lispro (HUMALOG KWIKPEN) 100 UNIT/ML KiwkPen; Inject 0.1 mLs (10 Units total) into the skin 3 (three) times daily. -     Amb Referral to Nutrition and Diabetic E -     Ambulatory referral to Endocrinology -     glucose blood (ONETOUCH VERIO) test strip; CHECK BLOOD SUGAR QID. -     ONETOUCH DELICA LANCETS 96E MISC; Inject 1 Act into the skin 4 (four) times daily - after meals and at bedtime. -     rosuvastatin (CRESTOR) 20 MG tablet; Take 1 tablet (20 mg total) by mouth daily.  Essential hypertension- Her blood pressure is well  controlled.  Electrolytes are normal.  She has mild renal insufficiency.  Will continue the combination of an ACE inhibitor and thiazide diuretic. -     Comprehensive metabolic panel; Future -     CBC with Differential/Platelet; Future -     Urinalysis, Routine w reflex microscopic; Future  Acquired hypothyroidism- Her TSH is in the normal range.  I have asked to remain on the current dose of levothyroxine. -     TSH; Future  Hyperlipidemia LDL goal <130-she has not achieved her LDL goal.  I have asked her to upgrade from low-dose atorvastatin to high-dose rosuvastatin for CV risk reduction. -     Lipid panel; Future -     TSH; Future -     rosuvastatin (CRESTOR) 20 MG tablet; Take 1 tablet (20 mg total) by mouth daily.  Chronic renal disease, stage 3, moderately decreased glomerular filtration rate (GFR) between 30-59 mL/min/1.73 square meter ([place])- she will avoid nephrotoxic agents.  I will attempt to get better blood sugar control.  Will continue blood pressure control as well.   I have discontinued Meredith Hayden "Chris"'s ALPRAZolam, co-enzyme X-52, Garlic, fluticasone furoate-vilanterol, clobetasol ointment, lidocaine, atorvastatin, Dulaglutide, carbamazepine, nitrofurantoin (macrocrystal-monohydrate), ciprofloxacin, and phenazopyridine. I have changed her ONETOUCH VERIO to glucose blood. I have also changed her ONETOUCH DELICA LANCETS 84X. Additionally, I am having her start on Insulin Glargine, insulin lispro, and rosuvastatin. Lastly, I am having her maintain her aspirin, Omega-3, desvenlafaxine, cetirizine, cyanocobalamin, Vitamin D3, vitamin C, Calcium Carbonate-Vit D-Min (CALCIUM 1200 PO), ONETOUCH VERIO, omeprazole, lamoTRIgine, zolpidem, lisinopril-hydrochlorothiazide, metoprolol tartrate, ATROVENT HFA, and levothyroxine. We will stop administering sodium chloride.  Meds ordered this encounter  Medications  . Insulin Glargine (TOUJEO SOLOSTAR) 300 UNIT/ML SOPN    Sig: Inject 60  Units into the skin daily.    Dispense:  3 mL    Refill:  5  .  insulin lispro (HUMALOG KWIKPEN) 100 UNIT/ML KiwkPen    Sig: Inject 0.1 mLs (10 Units total) into the skin 3 (three) times daily.    Dispense:  15 mL    Refill:  11  . glucose blood (ONETOUCH VERIO) test strip    Sig: CHECK BLOOD SUGAR QID.    Dispense:  200 each    Refill:  11  . ONETOUCH DELICA LANCETS 03X MISC    Sig: Inject 1 Act into the skin 4 (four) times daily - after meals and at bedtime.    Dispense:  200 each    Refill:  11  . rosuvastatin (CRESTOR) 20 MG tablet    Sig: Take 1 tablet (20 mg total) by mouth daily.    Dispense:  90 tablet    Refill:  1     Follow-up: Return in about 3 months (around 03/17/2018).  Scarlette Calico, MD

## 2017-12-17 NOTE — Telephone Encounter (Signed)
Patient dismissed from [facility] by Philemon Kingdom MD , effective December 12, 2017. Dismissal letter sent out by certified / registered mail.  [email or web address]

## 2017-12-17 NOTE — Patient Instructions (Signed)

## 2017-12-18 ENCOUNTER — Encounter: Payer: Self-pay | Admitting: Internal Medicine

## 2017-12-18 DIAGNOSIS — N183 Chronic kidney disease, stage 3 unspecified: Secondary | ICD-10-CM | POA: Insufficient documentation

## 2017-12-18 LAB — POCT GLUCOSE (DEVICE FOR HOME USE): Glucose Fasting, POC: 326 mg/dL — AB (ref 70–99)

## 2017-12-18 LAB — POCT GLYCOSYLATED HEMOGLOBIN (HGB A1C): Hemoglobin A1C: 12

## 2017-12-18 MED ORDER — ROSUVASTATIN CALCIUM 20 MG PO TABS: 20.0000 mg | ORAL_TABLET | Freq: Every day | ORAL | 1 refills | Status: DC

## 2017-12-25 ENCOUNTER — Other Ambulatory Visit: Payer: Self-pay | Admitting: Internal Medicine

## 2017-12-25 DIAGNOSIS — E1129 Type 2 diabetes mellitus with other diabetic kidney complication: Secondary | ICD-10-CM

## 2017-12-25 DIAGNOSIS — IMO0002 Reserved for concepts with insufficient information to code with codable children: Secondary | ICD-10-CM

## 2017-12-25 DIAGNOSIS — E1165 Type 2 diabetes mellitus with hyperglycemia: Principal | ICD-10-CM

## 2017-12-25 MED ORDER — INSULIN PEN NEEDLE 33G X 4 MM MISC: 1.0000 | Freq: Three times a day (TID) | 11 refills | Status: DC

## 2017-12-25 NOTE — Telephone Encounter (Signed)
Received signed domestic return receipt verifying delivery of certified letter on December 19, 2017. Article number [id number]

## 2017-12-27 ENCOUNTER — Ambulatory Visit: Payer: Medicare Other | Admitting: Obstetrics and Gynecology

## 2017-12-30 NOTE — Progress Notes (Addendum)
Subjective:   Meredith Hayden is a 67 y.o. female who presents for Medicare Annual (Subsequent) preventive examination.  Review of Systems:  No ROS.  Medicare Wellness Visit. Additional risk factors are reflected in the social history.  Cardiac Risk Factors include: advanced age (>41mn, >>61women);hypertension;diabetes mellitus;dyslipidemia;obesity (BMI >30kg/m2);sedentary lifestyle Sleep patterns: gets up 1-2 times nightly to void and sleeps 10-12 hours nightly.    Home Safety/Smoke Alarms: Feels safe in home. Smoke alarms in place.  Living environment; residence and Firearm Safety: 1-story house/ trailer, no firearms. Lives with husband, no needs for DME, good support system Seat Belt Safety/Bike Helmet: Wears seat belt.     Objective:     Vitals: BP 133/78   Pulse 61   Resp 20   Ht '5\' 2"'  (1.575 m)   Wt 234 lb (106.1 kg)   LMP 12/31/1993   SpO2 98%   BMI 42.80 kg/m   Body mass index is 42.8 kg/m.  Advanced Directives 01/01/2018 11/14/2016 01/25/2015  Does Patient Have a Medical Advance Directive? No No No  Does patient want to make changes to medical advance directive? Yes (ED - Information included in AVS) - -  Would patient like information on creating a medical advance directive? - - No - patient declined information    Tobacco Social History   Tobacco Use  Smoking Status Never Smoker  Smokeless Tobacco Never Used     Counseling given: Not Answered  Past Medical History:  Diagnosis Date  . Allergy   . Anxiety   . Arthritis   . Asthma   . Blood transfusion without reported diagnosis 2002   with knee replacement  . Depression   . Diabetes mellitus   . 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 Diverticulitis   . Dyspareunia    vaginal dryness  . Fibroid    reason for hysterectomy  . GERD (gastroesophageal reflux disease)   . 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 Hyperlipidemia   .  Hypertension   . IBS (irritable bowel syndrome)   . OSA (obstructive sleep apnea)   . Osteopenia 2009  . Sleep apnea   . STD (sexually transmitted disease)    Hx HPV  . Thyroid disease    Past Surgical History:  Procedure Laterality Date  . ABDOMINAL HYSTERECTOMY  1995   TAH/LSO--Dr. LUbaldo Glassing . [facility] . FOOT SURGERY Bilateral   . NASAL SINUS SURGERY    . OOPHORECTOMY    . PARTIAL COLECTOMY     -RSO  . ROTATOR CUFF REPAIR Left   . TOTAL KNEE ARTHROPLASTY Bilateral 2002, 2004   Family History  Problem Relation Age of Onset  . COPD Mother   . Diabetes Mother   . Heart attack Father 53 . Hypertension Father   . Heart attack Sister 44 . Diabetes Sister   . Hypertension Sister   . Diabetes Brother   . Hypertension Sister   . Diabetes Sister   . Hyperlipidemia Unknown        fhx  . Hypertension Unknown        fhx  . Cancer Unknown        ovarian/grandmother  . Ovarian cancer Maternal Aunt   . Diabetes Maternal Aunt   . Stomach cancer Maternal Grandmother   . Liver cancer Maternal Grandmother    Social History   Socioeconomic History  . Marital  status: Married    Spouse name: None  . Number of children: 2  . Years of education: 22  . Highest education level: None  Social Needs  . Financial resource strain: Not hard at all  . Food insecurity - worry: Never true  . Food insecurity - inability: Never true  . Transportation needs - medical: None  . Transportation needs - non-medical: None  Occupational History  . Occupation: disabled    Comment: orthopedic / psych  Tobacco Use  . Smoking status: Never Smoker  . Smokeless tobacco: Never Used  Substance and Sexual Activity  . Alcohol use: No    Alcohol/week: 0.0 oz  . Drug use: No  . Sexual activity: No    Partners: Male    Birth control/protection: Surgical    Comment: TAH  Other Topics Concern  . None  Social History Narrative   Grew up in [place]. Currently resides in resides in a house  with her husband. 4 dogs. Fun: Play with your dogs and be with grandchildren, needle work.    Denies any religious beliefs effecting healthcare.     Outpatient Encounter Medications as of 01/01/2018  Medication Sig  . ALPRAZolam (XANAX) 1 MG tablet Take 1 mg by mouth 3 (three) times daily as needed for anxiety.  . Ascorbic Acid (VITAMIN C) 1000 MG tablet Take 1,000 mg by mouth daily.  Marland Kitchen aspirin 81 MG tablet Take 81 mg by mouth daily.   . ATROVENT HFA 17 MCG/ACT inhaler USE 2 PUFFS EVERY 4 HOURS  AS NEEDED FOR WHEEZING.  . Blood Glucose Monitoring Suppl (ONETOUCH VERIO) W/DEVICE KIT 1 each by Does not apply route daily.  . cetirizine (ZYRTEC) 10 MG tablet Take 10 mg by mouth daily.   . Cholecalciferol (VITAMIN D3) 1000 UNITS CAPS Take 1 capsule by mouth daily.    . cyanocobalamin 100 MCG tablet Take 100 mcg by mouth daily.    Marland Kitchen desvenlafaxine (PRISTIQ) 100 MG 24 hr tablet Take 100 mg by mouth daily.    Marland Kitchen glucose blood (ONETOUCH VERIO) test strip CHECK BLOOD SUGAR QID.  Marland Kitchen Insulin Glargine (TOUJEO SOLOSTAR) 300 UNIT/ML SOPN Inject 60 Units into the skin daily.  . insulin lispro (HUMALOG KWIKPEN) 100 UNIT/ML KiwkPen Inject 0.1 mLs (10 Units total) into the skin 3 (three) times daily.  . Insulin Pen Needle (INSUPEN PEN NEEDLES) 33G X 4 MM MISC Inject 1 Act into the skin 4 (four) times daily - after meals and at bedtime.  . lamoTRIgine (LAMICTAL) 200 MG tablet Take 200 mg by mouth daily.   Marland Kitchen levothyroxine (SYNTHROID, LEVOTHROID) 50 MCG tablet TAKE 1 TABLET BY MOUTH  DAILY  . lisinopril-hydrochlorothiazide (PRINZIDE,ZESTORETIC) 20-12.5 MG tablet TAKE 1 TABLET BY MOUTH  DAILY  . metoprolol tartrate (LOPRESSOR) 25 MG tablet TAKE 1 TABLET BY MOUTH TWO  TIMES DAILY  . Omega-3 350 MG CAPS Take 350 mg by mouth daily.    Marland Kitchen omeprazole (PRILOSEC) 20 MG capsule Take 20 mg by mouth daily.   [name] DELICA LANCETS 95A MISC Inject 1 Act into the skin 4 (four) times daily - after meals and at bedtime.  .  rosuvastatin (CRESTOR) 20 MG tablet Take 1 tablet (20 mg total) by mouth daily.  Marland Kitchen zolpidem (AMBIEN) 10 MG tablet Take 10 mg by mouth at bedtime as needed for sleep.  . [DISCONTINUED] Calcium Carbonate-Vit D-Min (CALCIUM 1200 PO) Take 1 tablet by mouth daily.   No facility-administered encounter medications on file as of 01/01/2018.  Activities of Daily Living In your present state of health, do you have any difficulty performing the following activities: 01/01/2018  Hearing? N  Vision? N  Difficulty concentrating or making decisions? N  Walking or climbing stairs? N  Dressing or bathing? N  Doing errands, shopping? N  Preparing Food and eating ? N  Using the Toilet? N  In the past six months, have you accidently leaked urine? N  Do you have problems with loss of bowel control? N  Managing your Medications? N  Managing your Finances? N  Housekeeping or managing your Housekeeping? N  Some recent data might be hidden    Patient Care Team: Janith Lima, MD as PCP - General (Internal Medicine) Nunzio Cobbs, MD as Consulting Physician (Obstetrics and Gynecology) Irene Shipper, MD as Consulting Physician (Gastroenterology) Garald Balding, MD as Referring Physician (Internal Medicine) Chucky May, MD as Consulting Physician (Psychiatry)    Assessment:   This is a routine wellness examination for Erline. Physical assessment deferred to PCP.   Exercise Activities and Dietary recommendations Current Exercise Habits: The patient does not participate in regular exercise at present, Exercise limited by: orthopedic condition(s) Diet (meal preparation, eat out, water intake, caffeinated beverages, dairy products, fruits and vegetables): in general, a "healthy" diet    Reviewed heart healthy and diabetic diet, encouraged patient to increase daily water intake.  Goals    . Patient Stated     Take my dogs walking and create a nutrition plan that works for me and my husband.  I want to paint and remodel my house. Spending time with family and grandchildren. Make a new year for [place].       Depression Screen PHQ 2/9 Scores 01/01/2018  PHQ - 2 Score 4  PHQ- 9 Score 12     Cognitive Function MMSE - Mini Mental State Exam 01/01/2018  Not completed: Refused       Ad8 score reviewed for issues:  Issues making decisions: no  Less interest in hobbies / activities: no  Repeats questions, stories (family complaining): no  Trouble using ordinary gadgets (microwave, computer, phone):no  Forgets the month or year: no  Mismanaging finances: no  Remembering appts: no  Daily problems with thinking and/or memory: yes Ad8 score is= yes  Immunization History  Administered Date(s) Administered  . Hepatitis A, Adult 03/01/2015  . Influenza Split 09/19/2011  . Influenza Whole 12/08/2007, 10/07/2009, 10/13/2010  . Influenza, High Dose Seasonal PF 10/12/2016, 09/05/2017  . Influenza,inj,Quad PF,6+ Mos 12/09/2013, 09/30/2015  . Influenza-Unspecified 11/26/2014  . Pneumococcal Conjugate-13 01/01/2018  . Pneumococcal Polysaccharide-23 09/19/2011  . Td 09/19/2007  . Zoster 07/07/2013   Screening Tests Health Maintenance  Topic Date Due  . HIV Screening  09/26/1965  . MAMMOGRAM  03/08/2017  . TETANUS/TDAP  09/18/2017  . OPHTHALMOLOGY EXAM  02/20/2018  . HEMOGLOBIN A1C  06/17/2018  . FOOT EXAM  12/17/2018  . PNA vac Low Risk Adult (2 of 2 - PPSV23) 01/01/2019  . COLONOSCOPY  11/14/2026  . INFLUENZA VACCINE  Completed  . DEXA SCAN  Completed  . Hepatitis C Screening  Completed      Plan:    Patient declined mammogram referral stating she will call [facility] to schedule the screening appointment.   Continue doing brain stimulating activities (puzzles, reading, adult coloring books, staying active) to keep memory sharp.   Continue to eat heart healthy diet (full of fruits, vegetables, whole grains, lean protein, water--limit salt, fat, and sugar  intake) and increase physical activity as tolerated.  I have personally reviewed and noted the following in the patient's chart:   . Medical and social history . Use of alcohol, tobacco or illicit drugs  . Current medications and supplements . Functional ability and status . Nutritional status . Physical activity . Advanced directives . List of other physicians . Vitals . Screenings to include cognitive, depression, and falls . Referrals and appointments  In addition, I have reviewed and discussed with patient certain preventive protocols, quality metrics, and best practice recommendations. A written personalized care plan for preventive services as well as general preventive health recommendations were provided to patient.     Michiel Cowboy, RN  01/01/2018  Medical screening examination/treatment/procedure(s) were performed by non-physician practitioner and as supervising physician I was immediately available for consultation/collaboration. I agree with above. Scarlette Calico, MD

## 2018-01-01 ENCOUNTER — Ambulatory Visit (INDEPENDENT_AMBULATORY_CARE_PROVIDER_SITE_OTHER): Payer: Medicare Other | Admitting: *Deleted

## 2018-01-01 VITALS — BP 133/78 | HR 61 | Resp 20 | Ht 62.0 in | Wt 234.0 lb

## 2018-01-01 DIAGNOSIS — Z23 Encounter for immunization: Secondary | ICD-10-CM | POA: Diagnosis not present

## 2018-01-01 DIAGNOSIS — Z0000 Encounter for general adult medical examination without abnormal findings: Secondary | ICD-10-CM | POA: Diagnosis not present

## 2018-01-01 NOTE — Patient Instructions (Addendum)
Continue doing brain stimulating activities (puzzles, reading, adult coloring books, staying active) to keep memory sharp.   Continue to eat heart healthy diet (full of fruits, vegetables, whole grains, lean protein, water--limit salt, fat, and sugar intake) and increase physical activity as tolerated.   Meredith Hayden , Thank you for taking time to come for your Medicare Wellness Visit. I appreciate your ongoing commitment to your health goals. Please review the following plan we discussed and let me know if I can assist you in the future.   These are the goals we discussed: Goals    . Patient Stated     Take my dogs walking and create a nutrition plan that works for me and my husband. I want to paint and remodel my house. Spending time with family and grandchildren. Make a new year for [place].        This is a list of the screening recommended for you and due dates:  Health Maintenance  Topic Date Due  . HIV Screening  09/26/1965  . Mammogram  03/08/2017  . Tetanus Vaccine  09/18/2017  . Eye exam for diabetics  02/20/2018  . Hemoglobin A1C  06/17/2018  . Complete foot exam   12/17/2018  . Pneumonia vaccines (2 of 2 - PPSV23) 01/01/2019  . Colon Cancer Screening  11/14/2026  . Flu Shot  Completed  . DEXA scan (bone density measurement)  Completed  .  Hepatitis C: One time screening is recommended by Center for Disease Control  (CDC) for  adults born from [birth year] through 1963.   Completed

## 2018-02-07 ENCOUNTER — Ambulatory Visit: Payer: Medicare Other | Admitting: Family

## 2018-02-07 DIAGNOSIS — Z0289 Encounter for other administrative examinations: Secondary | ICD-10-CM

## 2018-02-10 ENCOUNTER — Encounter: Payer: Self-pay | Admitting: Family

## 2018-02-10 ENCOUNTER — Other Ambulatory Visit (INDEPENDENT_AMBULATORY_CARE_PROVIDER_SITE_OTHER): Payer: Medicare Other

## 2018-02-10 ENCOUNTER — Ambulatory Visit (INDEPENDENT_AMBULATORY_CARE_PROVIDER_SITE_OTHER): Payer: Medicare Other | Admitting: Family

## 2018-02-10 VITALS — BP 136/84 | HR 58 | Temp 98.4°F | Ht 62.0 in | Wt 233.1 lb

## 2018-02-10 DIAGNOSIS — R2689 Other abnormalities of gait and mobility: Secondary | ICD-10-CM

## 2018-02-10 DIAGNOSIS — R29818 Other symptoms and signs involving the nervous system: Secondary | ICD-10-CM

## 2018-02-10 DIAGNOSIS — I10 Essential (primary) hypertension: Secondary | ICD-10-CM

## 2018-02-10 DIAGNOSIS — R300 Dysuria: Secondary | ICD-10-CM | POA: Diagnosis not present

## 2018-02-10 LAB — COMPREHENSIVE METABOLIC PANEL
ALK PHOS: 80 U/L (ref 39–117)
ALT: 17 U/L (ref 0–35)
AST: 22 U/L (ref 0–37)
Albumin: 3.9 g/dL (ref 3.5–5.2)
BILIRUBIN TOTAL: 0.4 mg/dL (ref 0.2–1.2)
BUN: 16 mg/dL (ref 6–23)
CALCIUM: 8.8 mg/dL (ref 8.4–10.5)
CO2: 29 mEq/L (ref 19–32)
Chloride: 102 mEq/L (ref 96–112)
Creatinine, Ser: 1.5 mg/dL — ABNORMAL HIGH (ref 0.40–1.20)
GFR: 37 mL/min — AB (ref >60.00)
GLUCOSE: 105 mg/dL — AB (ref 70–99)
POTASSIUM: 3.8 meq/L (ref 3.5–5.1)
Sodium: 140 mEq/L (ref 135–145)
TOTAL PROTEIN: 7.1 g/dL (ref 6.0–8.3)

## 2018-02-10 LAB — CBC WITH DIFFERENTIAL/PLATELET
BASOS PCT: 0.8 %
Basophils Absolute: 59 cells/uL (ref 0–200)
EOS ABS: 266 {cells}/uL (ref 15–500)
Eosinophils Relative: 3.6 %
HEMATOCRIT: 35.5 % (ref 35.0–45.0)
Hemoglobin: 11.9 g/dL (ref 11.7–15.5)
LYMPHS ABS: 3337 {cells}/uL (ref 850–3900)
MCH: 29.5 pg (ref 27.0–33.0)
MCHC: 33.5 g/dL (ref 32.0–36.0)
MCV: 88.1 fL (ref 80.0–100.0)
MPV: 11.2 fL (ref 7.5–12.5)
Monocytes Relative: 6.9 %
Neutro Abs: 3226 cells/uL (ref 1500–7800)
Neutrophils Relative %: 43.6 %
PLATELETS: 243 10*3/uL (ref 140–400)
RBC: 4.03 10*6/uL (ref 3.80–5.10)
RDW: 12.3 % (ref 11.0–15.0)
TOTAL LYMPHOCYTE: 45.1 %
WBC: 7.4 10*3/uL (ref 3.8–10.8)
WBCMIX: 511 {cells}/uL (ref 200–950)

## 2018-02-10 NOTE — Addendum Note (Signed)
Addended by: Sallye Ober on: 02/10/2018 10:37 AM   Modules accepted: Orders

## 2018-02-10 NOTE — Progress Notes (Signed)
Meredith Hayden is a 68 y.o. female with the following history as recorded in EpicCare:  Patient Active Problem List   Diagnosis Date Noted  . Chronic renal disease, stage 3, moderately decreased glomerular filtration rate (GFR) between 30-59 mL/min/1.73 square meter (HCC) 12/18/2017  . Urine discoloration 02/05/2017  . Medicare annual wellness visit, subsequent 03/06/2016  . Routine general medical examination at a health care facility 03/06/2016  . Diabetes mellitus with renal manifestations, uncontrolled ([place]) 02/21/2016  . Fatty liver disease, nonalcoholic 16/55/3748  . PSVT (paroxysmal supraventricular tachycardia) ([place])   . IRRITABLE BOWEL SYNDROME 03/05/2008  . DISORDER, BIPOLAR NEC 09/19/2007  . SLEEP APNEA 09/19/2007  . Hypothyroidism 08/26/2007  . OSTEOARTHRITIS 08/26/2007  . Hyperlipidemia LDL goal <130 10/31/2006  . OBESITY, MORBID 10/31/2006  . ANXIETY 10/31/2006  . Depression 10/31/2006  . Essential hypertension 10/31/2006  . ALLERGIC RHINITIS 10/31/2006  . ASTHMA 10/31/2006  . DIVERTICULOSIS, COLON 10/31/2006  . COLONIC POLYPS 11/29/2005    Current Outpatient Medications  Medication Sig Dispense Refill  . ALPRAZolam (XANAX) 1 MG tablet Take 1 mg by mouth 3 (three) times daily as needed for anxiety.    . Ascorbic Acid (VITAMIN C) 1000 MG tablet Take 1,000 mg by mouth daily.    Marland Kitchen aspirin 81 MG tablet Take 81 mg by mouth daily.     . ATROVENT HFA 17 MCG/ACT inhaler USE 2 PUFFS EVERY 4 HOURS  AS NEEDED FOR WHEEZING. 12.9 g 0  . Blood Glucose Monitoring Suppl (ONETOUCH VERIO) W/DEVICE KIT 1 each by Does not apply route daily. 1 kit 0  . cetirizine (ZYRTEC) 10 MG tablet Take 10 mg by mouth daily.     . Cholecalciferol (VITAMIN D3) 1000 UNITS CAPS Take 1 capsule by mouth daily.      . cyanocobalamin 100 MCG tablet Take 100 mcg by mouth daily.      Marland Kitchen desvenlafaxine (PRISTIQ) 100 MG 24 hr tablet Take 100 mg by mouth daily.      Marland Kitchen glucose blood (ONETOUCH VERIO) test  strip CHECK BLOOD SUGAR QID. 200 each 11  . Insulin Glargine (TOUJEO SOLOSTAR) 300 UNIT/ML SOPN Inject 60 Units into the skin daily. 3 mL 5  . insulin lispro (HUMALOG KWIKPEN) 100 UNIT/ML KiwkPen Inject 0.1 mLs (10 Units total) into the skin 3 (three) times daily. 15 mL 11  . Insulin Pen Needle (INSUPEN PEN NEEDLES) 33G X 4 MM MISC Inject 1 Act into the skin 4 (four) times daily - after meals and at bedtime. 200 each 11  . lamoTRIgine (LAMICTAL) 200 MG tablet Take 200 mg by mouth daily.     Marland Kitchen levothyroxine (SYNTHROID, LEVOTHROID) 50 MCG tablet TAKE 1 TABLET BY MOUTH  DAILY 90 tablet 0  . lisinopril-hydrochlorothiazide (PRINZIDE,ZESTORETIC) 20-12.5 MG tablet TAKE 1 TABLET BY MOUTH  DAILY 90 tablet 1  . metoprolol tartrate (LOPRESSOR) 25 MG tablet TAKE 1 TABLET BY MOUTH TWO  TIMES DAILY 180 tablet 0  . Omega-3 350 MG CAPS Take 350 mg by mouth daily.      Marland Kitchen omeprazole (PRILOSEC) 20 MG capsule Take 20 mg by mouth daily.     [name] DELICA LANCETS 27M MISC Inject 1 Act into the skin 4 (four) times daily - after meals and at bedtime. 200 each 11  . rosuvastatin (CRESTOR) 20 MG tablet Take 1 tablet (20 mg total) by mouth daily. 90 tablet 1  . zolpidem (AMBIEN) 10 MG tablet Take 10 mg by mouth at bedtime as needed for sleep.  No current facility-administered medications for this visit.     Allergies: Oxycodone-acetaminophen; Codeine phosphate; Erythromycin; Morphine sulfate; Oxycodone hcl; Sulfamethoxazole; Tylenol [acetaminophen]; and Metformin and related  Past Medical History:  Diagnosis Date  . Allergy   . Anxiety   . Arthritis   . Asthma   . Blood transfusion without reported diagnosis 2002   with knee replacement  . Depression   . Diabetes mellitus   . Diabetes mellitus type 2 with retinopathy ([place])   . Diverticulitis   . Dyspareunia    vaginal dryness  . Fibroid    reason for hysterectomy  . GERD (gastroesophageal reflux disease)   . Heart abnormality    prolonged P to T  conductivity, partial right branch bundle block  . Hyperlipidemia   . Hypertension   . IBS (irritable bowel syndrome)   . Lichen sclerosus   . OSA (obstructive sleep apnea)   . Osteopenia 2009  . Sleep apnea   . STD (sexually transmitted disease)    Hx HPV  . Thyroid disease     Past Surgical History:  Procedure Laterality Date  . ABDOMINAL HYSTERECTOMY  1995   TAH/LSO--Dr. Ubaldo Glassing  . [place]  . FOOT SURGERY Bilateral   . NASAL SINUS SURGERY    . OOPHORECTOMY    . PARTIAL COLECTOMY     -RSO  . ROTATOR CUFF REPAIR Left   . TOTAL KNEE ARTHROPLASTY Bilateral 2002, 2004    Family History  Problem Relation Age of Onset  . COPD Mother   . Diabetes Mother   . Heart attack Father 57  . Hypertension Father   . Heart attack Sister 44  . Diabetes Sister   . Hypertension Sister   . Diabetes Brother   . Hypertension Sister   . Diabetes Sister   . Hyperlipidemia Unknown        fhx  . Hypertension Unknown        fhx  . Cancer Unknown        ovarian/grandmother  . Ovarian cancer Maternal Aunt   . Diabetes Maternal Aunt   . Stomach cancer Maternal Grandmother   . Liver cancer Maternal Grandmother     Social History   Tobacco Use  . Smoking status: Never Smoker  . Smokeless tobacco: Never Used  Substance Use Topics  . Alcohol use: No    Alcohol/week: 0.0 oz    Subjective:  Patient's appointment was originally scheduled for flu-like symptoms; notes that she felt like she "has turned the corner" in the past 24 hours; still having some lingering cough but denies any chest pain or shortness of breath;  Would like to discuss concerns about increased fall/ aphasia; feels that in general she falls 2-3 x per week for the past 2 years; in the past 2 days, frequency of falls has increased; feels that her balance is the problems; concerned that she can't spell words that she has used her entire life; notes that her mother had 6 inoperable brain aneurysms; has history of  Type 2 Diabetes- responding well to medication change recently; BS averaging 130-200- denies any concerns for low blood sugar; has known SVT/ PAF but does not take any medication other than Metoprolol; has not seen cardiology in almost 3 years; feels occasional symptoms but not very frequent; has known chronic sinus issues but denies any sinus pain/ pressure;   Objective:  Vitals:   02/10/18 0923  BP: 136/84  Pulse: (!) 58  Temp: 98.4 F (36.9 C)  TempSrc: Oral  SpO2: 98%  Weight: 233 lb 1.3 oz (105.7 kg)  Height: _0  (1.575 m)    General: Well developed, well nourished, in no acute distress  Skin : Warm and dry.  Head: Normocephalic and atraumatic  Eyes: Sclera and conjunctiva clear; pupils round and reactive to light; extraocular movements intact  Ears: External normal; canals clear; tympanic membranes normal  Oropharynx: Pink, supple. No suspicious lesions  Neck: Supple without thyromegaly, adenopathy  Lungs: Respirations unlabored; clear to auscultation bilaterally without wheeze, rales, rhonchi  CVS exam: normal rate and regular rhythm.  Vessels: Symmetric bilaterally  Neurologic: Alert and oriented; speech intact; face symmetrical; moves all extremities well; CNII-XII intact without focal deficit  Assessment:  1. Balance problem   2. Other symptoms and signs involving the nervous system   3. Essential hypertension     Plan:  Attempted to get EKG in office today but machine is not working- reading unavailable; will update labs and Brain MRI; refer to cardiology and neurology; follow-up to be determined.   No Follow-up on file.  Orders Placed This Encounter  Procedures  . Urine Culture    Standing Status:   Future    Standing Expiration Date:   02/10/2019  . MR Brain W Wo Contrast    Standing Status:   Future    Standing Expiration Date:   04/11/2019    Order Specific Question:   If indicated for the ordered procedure, I authorize the administration of contrast media per  Radiology protocol    Answer:   Yes    Order Specific Question:   What is the patient's sedation requirement?    Answer:   No Sedation    Order Specific Question:   Does the patient have a pacemaker or implanted devices?    Answer:   No    Order Specific Question:   Radiology Contrast Protocol - do NOT remove file path    Answer:   [email or web address]    Order Specific Question:   Preferred imaging location?    Answer:   [facility] (table limit-550lbs)  . CBC w/Diff    Standing Status:   Future    Standing Expiration Date:   02/10/2019  . Comprehensive metabolic panel    Standing Status:   Future    Standing Expiration Date:   02/10/2019  . Ambulatory referral to Neurology    Referral Priority:   Urgent    Referral Type:   Consultation    Referral Reason:   Specialty Services Required    Requested Specialty:   Neurology    Number of Visits Requested:   1  . Ambulatory referral to Cardiology    Referral Priority:   Routine    Referral Type:   Consultation    Referral Reason:   Specialty Services Required    Requested Specialty:   Cardiology    Number of Visits Requested:   1  . EKG 12-Lead    Requested Prescriptions    No prescriptions requested or ordered in this encounter

## 2018-02-11 LAB — URINE CULTURE
MICRO NUMBER:: 90179819
SPECIMEN QUALITY: ADEQUATE

## 2018-02-12 ENCOUNTER — Other Ambulatory Visit: Payer: Self-pay | Admitting: Family

## 2018-02-12 MED ORDER — PENICILLIN V POTASSIUM 500 MG PO TABS: 500.0000 mg | ORAL_TABLET | Freq: Three times a day (TID) | ORAL | 0 refills | Status: DC

## 2018-02-20 ENCOUNTER — Encounter: Payer: Self-pay | Admitting: Cardiology

## 2018-02-23 DIAGNOSIS — S63502A Unspecified sprain of left wrist, initial encounter: Secondary | ICD-10-CM | POA: Diagnosis not present

## 2018-02-23 DIAGNOSIS — S53402A Unspecified sprain of left elbow, initial encounter: Secondary | ICD-10-CM | POA: Diagnosis not present

## 2018-02-24 ENCOUNTER — Encounter: Payer: Self-pay | Admitting: Cardiology

## 2018-02-24 ENCOUNTER — Ambulatory Visit: Payer: Medicare Other | Admitting: Cardiology

## 2018-02-24 VITALS — BP 112/80 | HR 62 | Ht 61.5 in | Wt 240.0 lb

## 2018-02-24 DIAGNOSIS — I10 Essential (primary) hypertension: Secondary | ICD-10-CM | POA: Diagnosis not present

## 2018-02-24 NOTE — Progress Notes (Signed)
02/24/2018 Meredith Hayden   05/24/1950  [id number]  Primary Physician Janith Lima, MD Primary Cardiologist: Dr. Radford Pax   Reason for Visit/CC: Balance Issues/ Frequent Falls  HPI:  Meredith Hayden is a 68 y.o. female who was evaluated by Dr. Radford Pax in 2016 for atypical CP. She had a negative NST and 2D echo showed normal LVEF and wall motion. G1DD with mild MR noted. Pt was also noted to have PVCs and was treated with metoprolol. She has done well since that time and has only followed up as needed. Her PCP refills her metoprolol.    She presents to clinic today with new complaint of frequent falls due to balance issues. She reports that she saw her PCP and it was recommended that she be re evaluated by cardiology as well as neuro w/u to assess for causes. Based on her symptom pattern, her falls/ loss of balance are not c/w any cardiac etiology. 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 She denies orthostatic symptoms. No association with positional changes. She does not feel orthostatic. Also no associated cardiac symptoms in correlation with her falls. She denies CP, dizziness, palpitations, dyspnea, syncope/ near syncope. No LOC with her falls. With every fall, she has felt herself starting to lose balance and her legs get weak and knees buckle. She is scheduled to get a MRI next week and will be seen by neurology. She is also planning on seeing an orthopedists. She notes a lot of knee and hip pain.   In clinic today, she is asymptomatic. BP is well controlled 112/80. Initial pulse rate 62. EKG obtained and shows mild sinus bradycardia, 57 bpm. She is on metoprolol 25 mg BID for h/o SVT. Low voltage QRS noted on EKG but also noted on previous EKGs.   Cardiac Studies/ Procedures  NST 01/26/15 IMPRESSION: 1. No reversible ischemia or infarction.  2. Normal left ventricular wall motion.  3. Left ventricular ejection fraction  61%  4. Low-risk stress test findings*.  2D Echo 01/26/15 Study Conclusions  - Left ventricle: The cavity size was normal. Systolic function was normal. The estimated ejection fraction was in the range of 55% to 60%. Wall motion was normal; there were no regional wall motion abnormalities. There was an increased relative contribution of atrial contraction to ventricular filling. Doppler parameters are consistent with abnormal left ventricular relaxation (grade 1 diastolic dysfunction). - Mitral valve: There was mild regurgitation. - Atrial septum: No defect or patent foramen ovale was identified. - Tricuspid valve: There was trivial regurgitation.    Current Meds  Medication Sig  . ALPRAZolam (XANAX) 1 MG tablet Take 1 mg by mouth 3 (three) times daily as needed for anxiety.  . Ascorbic Acid (VITAMIN C) 1000 MG tablet Take 1,000 mg by mouth daily.  Marland Kitchen aspirin 81 MG tablet Take 81 mg by mouth daily.   . ATROVENT HFA 17 MCG/ACT inhaler USE 2 PUFFS EVERY 4 HOURS  AS NEEDED FOR WHEEZING.  . Blood Glucose Monitoring Suppl (ONETOUCH VERIO) W/DEVICE KIT 1 each by Does not apply route daily.  . cetirizine (ZYRTEC) 10 MG tablet Take 10 mg by mouth daily.   . Cholecalciferol (VITAMIN D3) 1000 UNITS CAPS Take 1 capsule by mouth daily.    . cyanocobalamin 100 MCG tablet Take 100 mcg by mouth daily.    Marland Kitchen desvenlafaxine (PRISTIQ) 100 MG 24 hr tablet Take 100 mg by mouth daily.    Marland Kitchen  glucose blood (ONETOUCH VERIO) test strip CHECK BLOOD SUGAR QID.  Marland Kitchen Insulin Glargine (TOUJEO SOLOSTAR) 300 UNIT/ML SOPN Inject 60 Units into the skin daily.  . insulin lispro (HUMALOG KWIKPEN) 100 UNIT/ML KiwkPen Inject 0.1 mLs (10 Units total) into the skin 3 (three) times daily.  . Insulin Pen Needle (INSUPEN PEN NEEDLES) 33G X 4 MM MISC Inject 1 Act into the skin 4 (four) times daily - after meals and at bedtime.  . lamoTRIgine (LAMICTAL) 200 MG tablet Take 200 mg by mouth daily.   Marland Kitchen levothyroxine  (SYNTHROID, LEVOTHROID) 50 MCG tablet TAKE 1 TABLET BY MOUTH  DAILY  . lisinopril-hydrochlorothiazide (PRINZIDE,ZESTORETIC) 20-12.5 MG tablet TAKE 1 TABLET BY MOUTH  DAILY  . metoprolol tartrate (LOPRESSOR) 25 MG tablet TAKE 1 TABLET BY MOUTH TWO  TIMES DAILY  . Omega-3 350 MG CAPS Take 350 mg by mouth daily.    Marland Kitchen omeprazole (PRILOSEC) 20 MG capsule Take 20 mg by mouth daily.   [name] DELICA LANCETS 94H MISC Inject 1 Act into the skin 4 (four) times daily - after meals and at bedtime.  . penicillin v potassium (VEETID) 500 MG tablet Take 1 tablet (500 mg total) by mouth 3 (three) times daily.  . rosuvastatin (CRESTOR) 20 MG tablet Take 1 tablet (20 mg total) by mouth daily.  Marland Kitchen zolpidem (AMBIEN) 10 MG tablet Take 10 mg by mouth at bedtime as needed for sleep.   Allergies  Allergen Reactions  . Oxycodone-Acetaminophen Anaphylaxis    Patient states that she can regular tylenol without any problems  . Codeine Phosphate     REACTION: unspecified  . Erythromycin Other (See Comments)    Dizzy and sweaty   . Metformin And Related Diarrhea  . Morphine Sulfate Itching    Tolerated hydromorphone   . Oxycodone Hcl     REACTION: unspecified  . Sulfamethoxazole     REACTION: rash  . Tylenol [Acetaminophen] Other (See Comments)    Fatty liver   Past Medical History:  Diagnosis Date  . Allergy   . Anxiety   . Arthritis   . Asthma   . Blood transfusion without reported diagnosis 2002   with knee replacement  . Depression   . Diabetes mellitus   . Diabetes mellitus type 2 with retinopathy ([place])   . Diverticulitis   . Dyspareunia    vaginal dryness  . Fibroid    reason for hysterectomy  . GERD (gastroesophageal reflux disease)   . 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 Hyperlipidemia   . Hypertension   . IBS (irritable bowel syndrome)   . Lichen sclerosus   . OSA (obstructive sleep apnea)   . Osteopenia 2009  . Sleep apnea   .  STD (sexually transmitted disease)    Hx HPV  . Thyroid disease    Family History  Problem Relation Age of Onset  . COPD Mother   . Diabetes Mother   . Heart attack Father 43  . Hypertension Father   . Heart attack Sister 38  . Diabetes Sister   . Hypertension Sister   . Diabetes Brother   . Hypertension Sister   . Diabetes Sister   . Hyperlipidemia Unknown        fhx  . Hypertension Unknown        fhx  . Cancer Unknown        ovarian/grandmother  . Ovarian cancer Maternal Aunt   . Diabetes  Maternal Aunt   . Stomach cancer Maternal Grandmother   . Liver cancer Maternal Grandmother    Past Surgical History:  Procedure Laterality Date  . ABDOMINAL HYSTERECTOMY  1995   TAH/LSO--Dr. Ubaldo Glassing  . [place]  . FOOT SURGERY Bilateral   . NASAL SINUS SURGERY    . OOPHORECTOMY    . PARTIAL COLECTOMY     -RSO  . ROTATOR CUFF REPAIR Left   . TOTAL KNEE ARTHROPLASTY Bilateral 2002, 2004   Social History   Socioeconomic History  . Marital status: Married    Spouse name: Not on file  . Number of children: 2  . Years of education: 56  . Highest education level: Not on file  Social Needs  . Financial resource strain: Not hard at all  . Food insecurity - worry: Never true  . Food insecurity - inability: Never true  . Transportation needs - medical: Not on file  . Transportation needs - non-medical: Not on file  Occupational History  . Occupation: disabled    Comment: orthopedic / psych  Tobacco Use  . Smoking status: Never Smoker  . Smokeless tobacco: Never Used  Substance and Sexual Activity  . Alcohol use: No    Alcohol/week: 0.0 oz  . Drug use: No  . Sexual activity: No    Partners: Male    Birth control/protection: Surgical    Comment: TAH  Other Topics Concern  . Not on file  Social History Narrative   Grew up in [place]. Currently resides in resides in a house with her husband. 4 dogs. Fun: Play with your dogs and be with grandchildren, needle work.     Denies any religious beliefs effecting healthcare.      Review of Systems: General: negative for chills, fever, night sweats or weight changes.  Cardiovascular: negative for chest pain, dyspnea on exertion, edema, orthopnea, palpitations, paroxysmal nocturnal dyspnea or shortness of breath Dermatological: negative for rash Respiratory: negative for cough or wheezing Urologic: negative for hematuria Abdominal: negative for nausea, vomiting, diarrhea, bright red blood per rectum, melena, or hematemesis Neurologic: negative for visual changes, syncope, or dizziness All other systems reviewed and are otherwise negative except as noted above.   Physical Exam:  Blood pressure 112/80, pulse 62, height 5' 1.5" (1.562 m), weight 240 lb (108.9 kg), last menstrual period 12/31/1993, SpO2 93 %.  General appearance: alert, cooperative, no distress and moderately obese Neck: no carotid bruit and no JVD Lungs: clear to auscultation bilaterally Heart: regular rate and rhythm, S1, S2 normal, no murmur, click, rub or gallop Extremities: extremities normal, atraumatic, no cyanosis or edema Pulses: 2+ and symmetric Skin: Skin color, texture, turgor normal. No rashes or lesions Neurologic: Grossly normal  EKG sinus bradycardia 57 bpm -- personally reviewed   ASSESSMENT AND PLAN:   1. Frequent Falls/ Loss of Balance: Based on her symptom pattern, her falls/ loss of balance are not c/w any cardiac etiology. 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 She denies orthostatic symptoms. No association with positional changes. She does not feel orthostatic. Also no associated cardiac symptoms in correlation with her falls. She denies CP, dizziness, palpitations, dyspnea, syncope/ near syncope. No LOC with her falls. With every fall, she has felt herself starting to lose balance and her legs get weak and knees buckle. Based on pt history, this seems more related to  neurological etiology vs mechanical/ orthopedic etiology with unstable joint stability involving  the knees +/- hip disease. I agree with her undergoing neurological evaluation as well as evaluation by an orthopedist. She is scheduled to get a MRI next week and will be seen by neurology. She is also planning on seeing an orthopedists. If w/u is unrevealing, then she can f/u with [place] and we can possibly consider an outpatient cardiac monitor.   2. H/oPVCs: she denies any palpitations, dizziness, dyspnea, CP, syncope/ near syncope. EKG shows sinus brady, 57 bpm. BP is stable. Continue metoprolol. Her PCP has been refilling her metoprolol Rx.    Follow-Up PRN.   Euriah Matlack Ladoris Gene, MHS [facility] 02/24/2018 12:01 PM

## 2018-02-24 NOTE — Patient Instructions (Signed)
Medication Instructions:  1. Your physician recommends that you continue on your current medications as directed. Please refer to the Current Medication list given to you today.   Labwork: NONE ORDERED TODAY  Testing/Procedures: NONE ORDERED TODAY  Follow-Up: FOLLOW UP AS NEEDED  Any Other Special Instructions Will Be Listed Below (If Applicable).     If you need a refill on your cardiac medications before your next appointment, please call your pharmacy.   

## 2018-02-25 ENCOUNTER — Telehealth: Payer: Self-pay

## 2018-02-25 NOTE — Telephone Encounter (Signed)
Left a detail message on mobile number for pt. Stating that the levothyroxine is the thyroid medication and that we received the request from her mail order pharmacy [facility]. Pt is due for an appt (around March 18th) so please have her schedule it when she calls back.

## 2018-02-25 NOTE — Telephone Encounter (Signed)
I have called and LVM to inform patient.   If she calls back, please have her set up an appointment for a CPE with Dr.Jones.

## 2018-02-25 NOTE — Telephone Encounter (Signed)
rx rq forl evothyroxine from [facility].   Pt is due for an appt. 90 day supply sent but I will not be able to send any more until pt is seen and has lab work.   Can you contact pt to schedule an appt?

## 2018-02-25 NOTE — Telephone Encounter (Signed)
Patient called back and said she didn't request for any medication refill and does not take that medication anymore.

## 2018-03-01 ENCOUNTER — Ambulatory Visit
Admission: RE | Admit: 2018-03-01 | Discharge: 2018-03-01 | Disposition: A | Discharge status: Home / Self Care | Payer: Medicare Other | Source: Ambulatory Visit | Attending: Family | Admitting: Family

## 2018-03-01 DIAGNOSIS — R296 Repeated falls: Secondary | ICD-10-CM | POA: Diagnosis not present

## 2018-03-01 DIAGNOSIS — R29818 Other symptoms and signs involving the nervous system: Secondary | ICD-10-CM

## 2018-03-01 MED ORDER — GADOBENATE DIMEGLUMINE 529 MG/ML IV SOLN
20.0000 mL | Freq: Once | INTRAVENOUS | Status: AC | PRN
  Administered 2018-03-01: 20 mL via INTRAVENOUS

## 2018-03-20 ENCOUNTER — Ambulatory Visit: Payer: Medicare Other | Admitting: Neurology

## 2018-03-24 ENCOUNTER — Other Ambulatory Visit: Payer: Self-pay | Admitting: Internal Medicine

## 2018-03-24 DIAGNOSIS — I10 Essential (primary) hypertension: Secondary | ICD-10-CM

## 2018-03-24 MED ORDER — METOPROLOL TARTRATE 25 MG PO TABS: 25.0000 mg | ORAL_TABLET | Freq: Two times a day (BID) | ORAL | 0 refills | Status: DC

## 2018-03-25 ENCOUNTER — Encounter: Payer: Self-pay | Admitting: Neurology

## 2018-03-26 ENCOUNTER — Encounter: Payer: Self-pay | Admitting: Endocrinology

## 2018-03-26 NOTE — Progress Notes (Deleted)
No show

## 2018-03-27 ENCOUNTER — Ambulatory Visit: Payer: Medicare Other | Admitting: Endocrinology

## 2018-03-27 DIAGNOSIS — Z0289 Encounter for other administrative examinations: Secondary | ICD-10-CM

## 2018-04-04 ENCOUNTER — Other Ambulatory Visit: Payer: Self-pay | Admitting: Orthopedic Surgery

## 2018-04-04 DIAGNOSIS — M25532 Pain in left wrist: Secondary | ICD-10-CM

## 2018-04-04 DIAGNOSIS — S53402D Unspecified sprain of left elbow, subsequent encounter: Secondary | ICD-10-CM | POA: Diagnosis not present

## 2018-04-09 ENCOUNTER — Encounter: Payer: Self-pay | Admitting: Diagnostic Neuroimaging

## 2018-04-09 ENCOUNTER — Ambulatory Visit: Payer: Medicare Other | Admitting: Diagnostic Neuroimaging

## 2018-04-09 VITALS — BP 127/71 | HR 63 | Ht 61.5 in | Wt 240.0 lb

## 2018-04-09 DIAGNOSIS — R269 Unspecified abnormalities of gait and mobility: Secondary | ICD-10-CM

## 2018-04-09 NOTE — Progress Notes (Signed)
[facility]  PATIENT: Meredith Hayden DOB: [birth year]  REFERRING CLINICIAN: Valere Dross HISTORY FROM: patient  REASON FOR VISIT: new consult    HISTORICAL  CHIEF COMPLAINT:  Chief Complaint  Patient presents with  . Balance problem    rm 6, New Pt, husband- Juanda Crumble, "in past year having frequent falls 3-4 x month; it's like my legs don't work right; I collapse"    HISTORY OF PRESENT ILLNESS:   68 year old female here for evaluation of balance difficulty.  For past 1 year patient is having difficulty with balance and walking, having more frequent falls.  She may fall up the steps or down the steps.  Patient has diabetes which has been worsening lately.  Recent hemoglobin A1c is 12.  Patient has numbness and tingling in her feet.  She has leg pain and low back pain.  She also has had bilateral knee surgeries in 2000 and 2002.  She has some neck pain and numbness in fingers.    REVIEW OF SYSTEMS: Full 14 system review of systems performed and negative with exception of: Weight gain blurred vision memory loss numbness insomnia snoring increased thirst joint pain depression anxiety racing thoughts disinterest activities rash allergies.  ALLERGIES: Allergies  Allergen Reactions  . Oxycodone-Acetaminophen Anaphylaxis    Patient states that she can regular tylenol without any problems  . Codeine Phosphate     REACTION: unspecified  . Erythromycin Other (See Comments)    Dizzy and sweaty   . Metformin And Related Diarrhea  . Morphine Sulfate Itching    Tolerated hydromorphone   . Oxycodone Hcl     REACTION: unspecified  . Sulfamethoxazole     REACTION: rash  . Tylenol [Acetaminophen] Other (See Comments)    Fatty liver    HOME MEDICATIONS: Outpatient Medications Prior to Visit  Medication Sig Dispense Refill  . ALPRAZolam (XANAX) 1 MG tablet Take 1 mg by mouth 3 (three) times daily as needed for anxiety.    . Ascorbic Acid (VITAMIN C) 1000 MG  tablet Take 1,000 mg by mouth daily.    Marland Kitchen aspirin 81 MG tablet Take 81 mg by mouth daily.     . ATROVENT HFA 17 MCG/ACT inhaler USE 2 PUFFS EVERY 4 HOURS  AS NEEDED FOR WHEEZING. 12.9 g 0  . Blood Glucose Monitoring Suppl (ONETOUCH VERIO) W/DEVICE KIT 1 each by Does not apply route daily. 1 kit 0  . cetirizine (ZYRTEC) 10 MG tablet Take 10 mg by mouth daily.     . Cholecalciferol (VITAMIN D3) 1000 UNITS CAPS Take 1 capsule by mouth daily.      . cyanocobalamin 100 MCG tablet Take 100 mcg by mouth daily.      Marland Kitchen desvenlafaxine (PRISTIQ) 100 MG 24 hr tablet Take 100 mg by mouth daily.      Marland Kitchen gabapentin (NEURONTIN) 100 MG capsule Take 200 mg by mouth 2 (two) times daily.    Marland Kitchen glucose blood (ONETOUCH VERIO) test strip CHECK BLOOD SUGAR QID. 200 each 11  . Insulin Glargine (TOUJEO SOLOSTAR) 300 UNIT/ML SOPN Inject 60 Units into the skin daily. 3 mL 5  . insulin lispro (HUMALOG KWIKPEN) 100 UNIT/ML KiwkPen Inject 0.1 mLs (10 Units total) into the skin 3 (three) times daily. 15 mL 11  . Insulin Pen Needle (INSUPEN PEN NEEDLES) 33G X 4 MM MISC Inject 1 Act into the skin 4 (four) times daily - after meals and at bedtime. 200 each 11  . lamoTRIgine (LAMICTAL) 200 MG tablet Take  200 mg by mouth daily.     Marland Kitchen levothyroxine (SYNTHROID, LEVOTHROID) 50 MCG tablet TAKE 1 TABLET BY MOUTH  DAILY 90 tablet 0  . lisinopril-hydrochlorothiazide (PRINZIDE,ZESTORETIC) 20-12.5 MG tablet TAKE 1 TABLET BY MOUTH  DAILY 90 tablet 1  . metoprolol tartrate (LOPRESSOR) 25 MG tablet Take 1 tablet (25 mg total) by mouth 2 (two) times daily. 180 tablet 0  . Omega-3 350 MG CAPS Take 350 mg by mouth daily.      Marland Kitchen omeprazole (PRILOSEC) 20 MG capsule Take 20 mg by mouth daily.     [name] DELICA LANCETS 96G MISC Inject 1 Act into the skin 4 (four) times daily - after meals and at bedtime. 200 each 11  . penicillin v potassium (VEETID) 500 MG tablet Take 1 tablet (500 mg total) by mouth 3 (three) times daily. 15 tablet 0  .  rosuvastatin (CRESTOR) 20 MG tablet Take 1 tablet (20 mg total) by mouth daily. 90 tablet 1  . zolpidem (AMBIEN) 10 MG tablet Take 10 mg by mouth at bedtime as needed for sleep.     No facility-administered medications prior to visit.     PAST MEDICAL HISTORY: Past Medical History:  Diagnosis Date  . Allergy   . Anxiety   . Arthritis   . Asthma   . Blood transfusion without reported diagnosis 2002   with knee replacement  . Depression   . Diabetes mellitus   . Diabetes mellitus type 2 with retinopathy ([place])   . Diverticulitis   . Dyspareunia    vaginal dryness  . Fibroid    reason for hysterectomy  . GERD (gastroesophageal reflux disease)   . 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 Heart disease   . Hyperlipidemia   . Hypertension   . IBS (irritable bowel syndrome)   . Lichen sclerosus   . OSA (obstructive sleep apnea)   . Osteopenia 2009  . Psoriasis   . Sleep apnea   . STD (sexually transmitted disease)    Hx HPV  . Thyroid disease     PAST SURGICAL HISTORY: Past Surgical History:  Procedure Laterality Date  . ABDOMINAL HYSTERECTOMY  1995   TAH/LSO--Dr. Ubaldo Glassing  . [place]  . FOOT SURGERY Bilateral   . NASAL SINUS SURGERY    . OOPHORECTOMY    . PARTIAL COLECTOMY     -RSO  . ROTATOR CUFF REPAIR Left   . TOTAL KNEE ARTHROPLASTY Bilateral 2002, 2004    FAMILY HISTORY: Family History  Problem Relation Age of Onset  . COPD Mother   . Diabetes Mother   . Heart attack Father 89  . Hypertension Father   . Heart attack Sister 69  . Diabetes Sister   . Hypertension Sister   . Diabetes Brother   . Hypertension Sister   . Diabetes Sister   . Hyperlipidemia Unknown        fhx  . Hypertension Unknown        fhx  . Cancer Unknown        ovarian/grandmother  . Ovarian cancer Maternal Aunt   . Diabetes Maternal Aunt   . Stomach cancer Maternal Grandmother   . Liver cancer Maternal Grandmother       SOCIAL HISTORY:  Social History   Socioeconomic History  . Marital status: Married    Spouse name: Juanda Crumble  . Number of children: 2  . Years of education: 61  .  Highest education level: Not on file  Occupational History  . Occupation: disabled    Comment: orthopedic / psych  Social Needs  . Financial resource strain: Not hard at all  . Food insecurity:    Worry: Never true    Inability: Never true  . Transportation needs:    Medical: Not on file    Non-medical: Not on file  Tobacco Use  . Smoking status: Never Smoker  . Smokeless tobacco: Never Used  Substance and Sexual Activity  . Alcohol use: No    Alcohol/week: 0.0 oz    Comment: stopped in 2018, was social   . Drug use: No  . Sexual activity: Never    Partners: Male    Birth control/protection: Surgical    Comment: TAH  Lifestyle  . Physical activity:    Days per week: Not on file    Minutes per session: Not on file  . Stress: Not on file  Relationships  . Social connections:    Talks on phone: Not on file    Gets together: Not on file    Attends religious service: Not on file    Active member of club or organization: Not on file    Attends meetings of clubs or organizations: Not on file    Relationship status: Not on file  . Intimate partner violence:    Fear of current or ex partner: Not on file    Emotionally abused: Not on file    Physically abused: Not on file    Forced sexual activity: Not on file  Other Topics Concern  . Not on file  Social History Narrative   Grew up in [place]. Currently resides in resides in a house with her husband. 4 dogs. Fun: Play with your dogs and be with grandchildren, needle work.    Denies any religious beliefs effecting healthcare.      PHYSICAL EXAM  GENERAL EXAM/CONSTITUTIONAL: Vitals:  Vitals:   04/09/18 1021  BP: 127/71  Pulse: 63  Weight: 240 lb (108.9 kg)  Height: 5' 1.5" (1.562 m)     Body mass index is 44.61 kg/m.  Visual Acuity Screening    Right eye Left eye Both eyes  Without correction:     With correction: 20/30 20/30      Patient is in no distress; well developed, nourished and groomed; neck is supple  CARDIOVASCULAR:  Examination of carotid arteries is normal; no carotid bruits  Regular rate and rhythm, no murmurs  Examination of peripheral vascular system by observation and palpation is normal  EYES:  Ophthalmoscopic exam of optic discs and posterior segments is normal; no papilledema or hemorrhages  MUSCULOSKELETAL:  Gait, strength, tone, movements noted in Neurologic exam below  NEUROLOGIC: MENTAL STATUS:  MMSE - Mini Mental State Exam 01/01/2018  Not completed: Refused    awake, alert, oriented to person, place and time  recent and remote memory intact  normal attention and concentration  language fluent, comprehension intact, naming intact,   fund of knowledge appropriate  CRANIAL NERVE:   2nd - no papilledema on fundoscopic exam  2nd, 3rd, 4th, 6th - pupils equal and reactive to light, visual fields full to confrontation, extraocular muscles intact, no nystagmus  5th - facial sensation symmetric  7th - facial strength symmetric  8th - hearing intact  9th - palate elevates symmetrically, uvula midline  11th - shoulder shrug symmetric  12th - tongue protrusion midline  MOTOR:   normal bulk and tone, full strength in  the BUE, BLE; LIMITED IN LUE DUE TO PAIN  SENSORY:   normal and symmetric to light touch, pinprick, temperature, vibration  DECR PP, TEMP, VIB IN FEET; RIGHT FOOT WORSE THAN LEFT  COORDINATION:   finger-nose-finger, fine finger movements normal  REFLEXES:   deep tendon reflexes TRACE and symmetric; ABSENT AT KNEES AND ANKLES  GAIT/STATION:   narrow based gait; ANTALGIC, SLOW CAUTIOUS; romberg is negative; DIFF WITH TANDEM    DIAGNOSTIC DATA (LABS, IMAGING, TESTING) - I reviewed patient records, labs, notes, testing and imaging myself where  available.  Lab Results  Component Value Date   WBC 7.4 02/10/2018   HGB 11.9 02/10/2018   HCT 35.5 02/10/2018   MCV 88.1 02/10/2018   PLT 243 02/10/2018      Component Value Date/Time   NA 140 02/10/2018 1037   NA 138 12/20/2014 1542   K 3.8 02/10/2018 1037   K 4.2 12/20/2014 1542   CL 102 02/10/2018 1037   CL 101 12/20/2014 1542   CO2 29 02/10/2018 1037   CO2 33 (H) 12/20/2014 1542   GLUCOSE 105 (H) 02/10/2018 1037   GLUCOSE 193 (H) 12/20/2014 1542   BUN 16 02/10/2018 1037   BUN 15 12/20/2014 1542   CREATININE 1.50 (H) 02/10/2018 1037   CREATININE 0.91 12/20/2014 1542   CREATININE 0.95 12/09/2014 1645   CALCIUM 8.8 02/10/2018 1037   CALCIUM 9.2 12/20/2014 1542   PROT 7.1 02/10/2018 1037   ALBUMIN 3.9 02/10/2018 1037   AST 22 02/10/2018 1037   ALT 17 02/10/2018 1037   ALKPHOS 80 02/10/2018 1037   BILITOT 0.4 02/10/2018 1037   GFRNONAA 67 (L) 01/27/2015 0555   GFRNONAA >60 12/20/2014 1542   GFRAA 78 (L) 01/27/2015 0555   GFRAA >60 12/20/2014 1542   Lab Results  Component Value Date   CHOL 196 12/17/2017   HDL 47.60 12/17/2017   LDLCALC 79 03/06/2016   LDLDIRECT 134.0 12/17/2017   TRIG 228.0 (H) 12/17/2017   CHOLHDL 4 12/17/2017   Lab Results  Component Value Date   HGBA1C 12.0 12/17/2017   No results found for: [id number] Lab Results  Component Value Date   TSH 2.43 12/17/2017    03/01/18 MRI brain  1. No acute process or abnormal enhancement of the brain. 2. Mild chronic microvascular ischemic changes and mild parenchymal volume loss of the brain.      ASSESSMENT AND PLAN  68 y.o. year old female here with increasing gait and balance difficulty over the past 1 year, likely multifactorial related to diabetic neuropathy, low back pain, obesity and deconditioning.   Dx: gait difficulty due to: diabetic neuropathy, low back pain, lumbar radiculopathy, knee pain, hip pain, obesity, deconditioning   1. Gait difficulty      PLAN:  GAIT  DIFFICULTY - improve nutrition, physical activity - refer to physical therapy  WORD FINDING DIFFICULTY - try to improve sleep, mood disorder and pain issues  Orders Placed This Encounter  Procedures  . Ambulatory referral to Physical Therapy   Return if symptoms worsen or fail to improve, for return to PCP.    Penni Bombard, MD 2/84/1324, 40:10 AM Certified in Neurology, Neurophysiology and Neuroimaging  [facility] [address] [place], [place] [address] [phone]

## 2018-04-12 ENCOUNTER — Inpatient Hospital Stay
Admission: RE | Admit: 2018-04-12 | Discharge: 2018-04-12 | Disposition: A | Discharge status: Home / Self Care | Payer: Medicare Other | Source: Ambulatory Visit | Attending: Orthopedic Surgery | Admitting: Orthopedic Surgery

## 2018-04-14 ENCOUNTER — Other Ambulatory Visit: Payer: Self-pay | Admitting: Family

## 2018-04-28 NOTE — Progress Notes (Deleted)
68 y.o. G76P2002 Married Caucasian female here for annual exam.    PCP:      Patient's last menstrual period was 12/31/1993.           Sexually active: {yes no:314532}  The current method of family planning is status post hysterectomy.    Exercising: {yes no:314532}  {types:19826} Smoker:  no  Health Maintenance: Pap: 2009 normal History of abnormal Pap:  no MMG: 03-09-15 Density B/Neg/BiRads1 Colonoscopy: 11-14-16 normal with Dr.Perry;next 10 years BMD: ***?03-09-15  Result  ***?Osteopenia TDaP: ***?Td 09-19-07 Gardasil:   no HIV: Hep C: Screening Labs:  Hb today: ***, Urine today: ***   reports that she has never smoked. She has never used smokeless tobacco. She reports that she does not drink alcohol or use drugs.  Past Medical History:  Diagnosis Date  . Allergy   . Anxiety   . Arthritis   . Asthma   . Blood transfusion without reported diagnosis 2002   with knee replacement  . Depression   . Diabetes mellitus   . Diabetes mellitus type 2 with retinopathy ([place])   . Diverticulitis   . Dyspareunia    vaginal dryness  . Fibroid    reason for hysterectomy  . GERD (gastroesophageal reflux disease)   . 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 Heart disease   . Hyperlipidemia   . Hypertension   . IBS (irritable bowel syndrome)   . Lichen sclerosus   . OSA (obstructive sleep apnea)   . Osteopenia 2009  . Psoriasis   . Sleep apnea   . STD (sexually transmitted disease)    Hx HPV  . Thyroid disease     Past Surgical History:  Procedure Laterality Date  . ABDOMINAL HYSTERECTOMY  1995   TAH/LSO--Dr. Ubaldo Glassing  . [place]  . FOOT SURGERY Bilateral   . NASAL SINUS SURGERY    . OOPHORECTOMY    . PARTIAL COLECTOMY     -RSO  . ROTATOR CUFF REPAIR Left   . TOTAL KNEE ARTHROPLASTY Bilateral 2002, 2004    Current Outpatient Medications  Medication Sig Dispense Refill  . ALPRAZolam (XANAX) 1 MG tablet Take 1  mg by mouth 3 (three) times daily as needed for anxiety.    . Ascorbic Acid (VITAMIN C) 1000 MG tablet Take 1,000 mg by mouth daily.    Marland Kitchen aspirin 81 MG tablet Take 81 mg by mouth daily.     . ATROVENT HFA 17 MCG/ACT inhaler USE 2 PUFFS EVERY 4 HOURS  AS NEEDED FOR WHEEZING. 12.9 g 0  . Blood Glucose Monitoring Suppl (ONETOUCH VERIO) W/DEVICE KIT 1 each by Does not apply route daily. 1 kit 0  . cetirizine (ZYRTEC) 10 MG tablet Take 10 mg by mouth daily.     . Cholecalciferol (VITAMIN D3) 1000 UNITS CAPS Take 1 capsule by mouth daily.      . cyanocobalamin 100 MCG tablet Take 100 mcg by mouth daily.      Marland Kitchen desvenlafaxine (PRISTIQ) 100 MG 24 hr tablet Take 100 mg by mouth daily.      Marland Kitchen gabapentin (NEURONTIN) 100 MG capsule Take 200 mg by mouth 2 (two) times daily.    Marland Kitchen glucose blood (ONETOUCH VERIO) test strip CHECK BLOOD SUGAR QID. 200 each 11  . Insulin Glargine (TOUJEO SOLOSTAR) 300 UNIT/ML SOPN Inject 60 Units into the skin daily. 3 mL 5  . insulin lispro (HUMALOG KWIKPEN) 100 UNIT/ML  KiwkPen Inject 0.1 mLs (10 Units total) into the skin 3 (three) times daily. 15 mL 11  . Insulin Pen Needle (INSUPEN PEN NEEDLES) 33G X 4 MM MISC Inject 1 Act into the skin 4 (four) times daily - after meals and at bedtime. 200 each 11  . lamoTRIgine (LAMICTAL) 200 MG tablet Take 200 mg by mouth daily.     Marland Kitchen levothyroxine (SYNTHROID, LEVOTHROID) 50 MCG tablet TAKE 1 TABLET BY MOUTH  DAILY 90 tablet 0  . lisinopril-hydrochlorothiazide (PRINZIDE,ZESTORETIC) 20-12.5 MG tablet TAKE 1 TABLET BY MOUTH  DAILY 90 tablet 1  . metoprolol tartrate (LOPRESSOR) 25 MG tablet Take 1 tablet (25 mg total) by mouth 2 (two) times daily. 180 tablet 0  . Omega-3 350 MG CAPS Take 350 mg by mouth daily.      Marland Kitchen omeprazole (PRILOSEC) 20 MG capsule Take 20 mg by mouth daily.     [name] DELICA LANCETS 50K MISC Inject 1 Act into the skin 4 (four) times daily - after meals and at bedtime. 200 each 11  . penicillin v potassium (VEETID) 500  MG tablet Take 1 tablet (500 mg total) by mouth 3 (three) times daily. 15 tablet 0  . rosuvastatin (CRESTOR) 20 MG tablet Take 1 tablet (20 mg total) by mouth daily. 90 tablet 1  . zolpidem (AMBIEN) 10 MG tablet Take 10 mg by mouth at bedtime as needed for sleep.     No current facility-administered medications for this visit.     Family History  Problem Relation Age of Onset  . COPD Mother   . Diabetes Mother   . Heart attack Father 28  . Hypertension Father   . Heart attack Sister 26  . Diabetes Sister   . Hypertension Sister   . Diabetes Brother   . Hypertension Sister   . Diabetes Sister   . Hyperlipidemia Unknown        fhx  . Hypertension Unknown        fhx  . Cancer Unknown        ovarian/grandmother  . Ovarian cancer Maternal Aunt   . Diabetes Maternal Aunt   . Stomach cancer Maternal Grandmother   . Liver cancer Maternal Grandmother     Review of Systems  Exam:   LMP 12/31/1993     General appearance: alert, cooperative and appears stated age Head: Normocephalic, without obvious abnormality, atraumatic Neck: no adenopathy, supple, symmetrical, trachea midline and thyroid normal to inspection and palpation Lungs: clear to auscultation bilaterally Breasts: normal appearance, no masses or tenderness, No nipple retraction or dimpling, No nipple discharge or bleeding, No axillary or supraclavicular adenopathy Heart: regular rate and rhythm Abdomen: soft, non-tender; no masses, no organomegaly Extremities: extremities normal, atraumatic, no cyanosis or edema Skin: Skin color, texture, turgor normal. No rashes or lesions Lymph nodes: Cervical, supraclavicular, and axillary nodes normal. No abnormal inguinal nodes palpated Neurologic: Grossly normal  Pelvic: External genitalia:  no lesions              Urethra:  normal appearing urethra with no masses, tenderness or lesions              Bartholins and Skenes: normal                 Vagina: normal appearing vagina  with normal color and discharge, no lesions              Cervix: no lesions  Pap taken: {yes no:314532} Bimanual Exam:  Uterus:  normal size, contour, position, consistency, mobility, non-tender              Adnexa: no mass, fullness, tenderness              Rectal exam: {yes no:314532}.  Confirms.              Anus:  normal sphincter tone, no lesions  Chaperone was present for exam.  Assessment:   Well woman visit with normal exam.   Plan: Mammogram screening. Recommended self breast awareness. Pap and HR HPV as above. Guidelines for Calcium, Vitamin D, regular exercise program including cardiovascular and weight bearing exercise.   Follow up annually and prn.   Additional counseling given.  {yes [id number]. _______ minutes face to face time of which over 50% was spent in counseling.    After visit summary provided.

## 2018-04-29 ENCOUNTER — Ambulatory Visit: Payer: Medicare Other | Admitting: Obstetrics and Gynecology

## 2018-05-07 ENCOUNTER — Ambulatory Visit: Payer: Medicare Other | Admitting: Endocrinology

## 2018-05-09 DIAGNOSIS — E11319 Type 2 diabetes mellitus with unspecified diabetic retinopathy without macular edema: Secondary | ICD-10-CM | POA: Diagnosis not present

## 2018-05-09 DIAGNOSIS — H524 Presbyopia: Secondary | ICD-10-CM | POA: Diagnosis not present

## 2018-05-09 LAB — HM DIABETES EYE EXAM

## 2018-05-12 ENCOUNTER — Other Ambulatory Visit: Payer: Self-pay | Admitting: Internal Medicine

## 2018-05-12 DIAGNOSIS — I10 Essential (primary) hypertension: Secondary | ICD-10-CM

## 2018-05-13 ENCOUNTER — Other Ambulatory Visit: Payer: Self-pay | Admitting: Family

## 2018-05-16 ENCOUNTER — Other Ambulatory Visit: Payer: Self-pay | Admitting: Obstetrics and Gynecology

## 2018-05-16 ENCOUNTER — Ambulatory Visit: Payer: Medicare Other | Admitting: Obstetrics and Gynecology

## 2018-05-16 DIAGNOSIS — Z1231 Encounter for screening mammogram for malignant neoplasm of breast: Secondary | ICD-10-CM

## 2018-05-19 ENCOUNTER — Encounter: Payer: Self-pay | Admitting: Internal Medicine

## 2018-05-19 ENCOUNTER — Other Ambulatory Visit (INDEPENDENT_AMBULATORY_CARE_PROVIDER_SITE_OTHER): Payer: Medicare Other

## 2018-05-19 ENCOUNTER — Ambulatory Visit (INDEPENDENT_AMBULATORY_CARE_PROVIDER_SITE_OTHER): Payer: Medicare Other | Admitting: Internal Medicine

## 2018-05-19 VITALS — BP 138/90 | HR 90 | Temp 98.4°F | Resp 16 | Ht 61.5 in | Wt 243.8 lb

## 2018-05-19 DIAGNOSIS — E1165 Type 2 diabetes mellitus with hyperglycemia: Secondary | ICD-10-CM

## 2018-05-19 DIAGNOSIS — E1129 Type 2 diabetes mellitus with other diabetic kidney complication: Secondary | ICD-10-CM

## 2018-05-19 DIAGNOSIS — IMO0002 Reserved for concepts with insufficient information to code with codable children: Secondary | ICD-10-CM

## 2018-05-19 DIAGNOSIS — E039 Hypothyroidism, unspecified: Secondary | ICD-10-CM

## 2018-05-19 DIAGNOSIS — N183 Chronic kidney disease, stage 3 unspecified: Secondary | ICD-10-CM

## 2018-05-19 DIAGNOSIS — I10 Essential (primary) hypertension: Secondary | ICD-10-CM | POA: Diagnosis not present

## 2018-05-19 DIAGNOSIS — Z23 Encounter for immunization: Secondary | ICD-10-CM | POA: Diagnosis not present

## 2018-05-19 DIAGNOSIS — Z1231 Encounter for screening mammogram for malignant neoplasm of breast: Secondary | ICD-10-CM

## 2018-05-19 LAB — POCT GLYCOSYLATED HEMOGLOBIN (HGB A1C): Hemoglobin A1C: 10.1 % — AB (ref 4.0–5.6)

## 2018-05-19 LAB — BASIC METABOLIC PANEL
BUN: 22 mg/dL (ref 6–23)
CALCIUM: 9.8 mg/dL (ref 8.4–10.5)
CO2: 31 meq/L (ref 19–32)
Chloride: 96 mEq/L (ref 96–112)
Creatinine, Ser: 1.45 mg/dL — ABNORMAL HIGH (ref 0.40–1.20)
GFR: 38.44 mL/min — ABNORMAL LOW (ref >60.00)
Glucose, Bld: 264 mg/dL — ABNORMAL HIGH (ref 70–99)
POTASSIUM: 3.6 meq/L (ref 3.5–5.1)
SODIUM: 137 meq/L (ref 135–145)

## 2018-05-19 LAB — POCT GLUCOSE (DEVICE FOR HOME USE): GLUCOSE FASTING, POC: 268 mg/dL — AB (ref 70–99)

## 2018-05-19 MED ORDER — DAPAGLIFLOZIN-SAXAGLIPTIN 10-5 MG PO TABS: 1.0000 | ORAL_TABLET | Freq: Every day | ORAL | 3 refills | Status: DC

## 2018-05-19 NOTE — Patient Instructions (Signed)

## 2018-05-19 NOTE — Progress Notes (Signed)
Subjective:  Patient ID: Meredith Hayden, female    DOB: February 29, 1950  Age: 68 y.o. MRN: [id number]  CC: Diabetes and Hypothyroidism   HPI Seth Friedlander presents for f/up - She complains that her blood sugars have not been very well controlled.  She also complains of weight gain.  She tells me she has been stress eating.  Her blood sugars are consistently above 200.  She continues to be compliant with her complicated insulin regimen.  She was to see a new endocrinologist.  She complains of polyphagia but denies polyuria or polydipsia.  She denies any recent episodes of headache, blurred vision, chest pain, shortness of breath, or edema.  Outpatient Medications Prior to Visit  Medication Sig Dispense Refill  . ALPRAZolam (XANAX) 1 MG tablet Take 1 mg by mouth 3 (three) times daily as needed for anxiety.    . Ascorbic Acid (VITAMIN C) 1000 MG tablet Take 1,000 mg by mouth daily.    Marland Kitchen aspirin 81 MG tablet Take 81 mg by mouth daily.     . ATROVENT HFA 17 MCG/ACT inhaler USE 2 PUFFS EVERY 4 HOURS  AS NEEDED FOR WHEEZING. 12.9 g 0  . Blood Glucose Monitoring Suppl (ONETOUCH VERIO) W/DEVICE KIT 1 each by Does not apply route daily. 1 kit 0  . cetirizine (ZYRTEC) 10 MG tablet Take 10 mg by mouth daily.     . Cholecalciferol (VITAMIN D3) 1000 UNITS CAPS Take 1 capsule by mouth daily.      . cyanocobalamin 100 MCG tablet Take 100 mcg by mouth daily.      Marland Kitchen desvenlafaxine (PRISTIQ) 100 MG 24 hr tablet Take 100 mg by mouth daily.      Marland Kitchen gabapentin (NEURONTIN) 100 MG capsule Take 200 mg by mouth 2 (two) times daily.    Marland Kitchen glucose blood (ONETOUCH VERIO) test strip CHECK BLOOD SUGAR QID. 200 each 11  . Insulin Glargine (TOUJEO SOLOSTAR) 300 UNIT/ML SOPN Inject 60 Units into the skin daily. 3 mL 5  . insulin lispro (HUMALOG KWIKPEN) 100 UNIT/ML KiwkPen Inject 0.1 mLs (10 Units total) into the skin 3 (three) times daily. 15 mL 11  . Insulin Pen Needle (INSUPEN PEN NEEDLES) 33G X 4 MM MISC  Inject 1 Act into the skin 4 (four) times daily - after meals and at bedtime. 200 each 11  . lamoTRIgine (LAMICTAL) 200 MG tablet Take 200 mg by mouth daily.     Marland Kitchen levothyroxine (SYNTHROID, LEVOTHROID) 50 MCG tablet TAKE 1 TABLET BY MOUTH  DAILY 90 tablet 0  . lisinopril-hydrochlorothiazide (PRINZIDE,ZESTORETIC) 20-12.5 MG tablet TAKE 1 TABLET BY MOUTH  DAILY 90 tablet 1  . metoprolol tartrate (LOPRESSOR) 25 MG tablet Take 1 tablet (25 mg total) by mouth 2 (two) times daily. 180 tablet 0  . Omega-3 350 MG CAPS Take 350 mg by mouth daily.      Marland Kitchen omeprazole (PRILOSEC) 20 MG capsule Take 20 mg by mouth daily.     [name] DELICA LANCETS 65B MISC Inject 1 Act into the skin 4 (four) times daily - after meals and at bedtime. 200 each 11  . penicillin v potassium (VEETID) 500 MG tablet Take 1 tablet (500 mg total) by mouth 3 (three) times daily. 15 tablet 0  . rosuvastatin (CRESTOR) 20 MG tablet Take 1 tablet (20 mg total) by mouth daily. 90 tablet 1  . zolpidem (AMBIEN) 10 MG tablet Take 10 mg by mouth at bedtime as needed for sleep.  No facility-administered medications prior to visit.     ROS Review of Systems  Constitutional: Positive for unexpected weight change. Negative for appetite change, diaphoresis and fatigue.  HENT: Negative.   Eyes: Negative for visual disturbance.  Respiratory: Negative for chest tightness, shortness of breath and wheezing.   Cardiovascular: Negative for chest pain, palpitations and leg swelling.  Gastrointestinal: Negative.  Negative for abdominal pain, constipation, diarrhea, nausea and vomiting.  Endocrine: Positive for polyphagia. Negative for polydipsia and polyuria.  Genitourinary: Negative.  Negative for decreased urine volume, difficulty urinating, dysuria, frequency and hematuria.  Musculoskeletal: Negative.  Negative for arthralgias and myalgias.  Skin: Negative.  Negative for color change and pallor.  Neurological: Negative.  Negative for dizziness,  weakness and light-headedness.  Hematological: Negative for adenopathy. Does not bruise/bleed easily.  Psychiatric/Behavioral: Negative.     Objective:  BP 138/90 (BP Location: Left Arm, Patient Position: Sitting, Cuff Size: Large)   Pulse 90   Temp 98.4 F (36.9 C) (Oral)   Resp 16   Ht 5' 1.5" (1.562 m)   Wt 243 lb 12 oz (110.6 kg)   LMP 12/31/1993   SpO2 97%   BMI 45.31 kg/m   BP Readings from Last 3 Encounters:  05/19/18 138/90  04/09/18 127/71  02/24/18 112/80    Wt Readings from Last 3 Encounters:  05/19/18 243 lb 12 oz (110.6 kg)  04/09/18 240 lb (108.9 kg)  02/24/18 240 lb (108.9 kg)    Physical Exam  Constitutional: She is oriented to person, place, and time. No distress.  HENT:  Mouth/Throat: Oropharynx is clear and moist. No oropharyngeal exudate.  Eyes: Conjunctivae are normal. No scleral icterus.  Neck: Normal range of motion. Neck supple. No JVD present. No thyromegaly present.  Cardiovascular: Normal rate, regular rhythm and normal heart sounds. Exam reveals no gallop and no friction rub.  No murmur heard. Pulmonary/Chest: Effort normal and breath sounds normal. No stridor. No respiratory distress. She has no wheezes. She has no rales.  Abdominal: Soft. Bowel sounds are normal. She exhibits no distension and no mass. There is no tenderness.  Musculoskeletal: Normal range of motion. She exhibits no edema, tenderness or deformity.  Lymphadenopathy:    She has no cervical adenopathy.  Neurological: She is alert and oriented to person, place, and time.  Skin: Skin is warm and dry. She is not diaphoretic.  Vitals reviewed.   Lab Results  Component Value Date   WBC 7.4 02/10/2018   HGB 11.9 02/10/2018   HCT 35.5 02/10/2018   PLT 243 02/10/2018   GLUCOSE 264 (H) 05/19/2018   CHOL 196 12/17/2017   TRIG 228.0 (H) 12/17/2017   HDL 47.60 12/17/2017   LDLDIRECT 134.0 12/17/2017   LDLCALC 79 03/06/2016   ALT 17 02/10/2018   AST 22 02/10/2018   NA 137  05/19/2018   K 3.6 05/19/2018   CL 96 05/19/2018   CREATININE 1.45 (H) 05/19/2018   BUN 22 05/19/2018   CO2 31 05/19/2018   TSH 2.35 05/19/2018   INR 1.05 01/26/2015   HGBA1C 10.1 (A) 05/19/2018   MICROALBUR 1.3 12/17/2017    No results found.  Assessment & Plan:   Dorette was seen today for diabetes and hypothyroidism.  Diagnoses and all orders for this visit:  Acquired hypothyroidism -her TSH is in the normal range.  She will remain on the current dose of levothyroxine. -     TSH; Future  Diabetes mellitus with renal manifestations, uncontrolled ([place])- Her A1c is better at  10.1% but is still too high.  She does not tolerate metformin.  Will continue the current insulin regimen and will add on a DPP4 inhibitor and an SGLT2 inhibitor.  She will also see a new endocrinologist. -     Basic metabolic panel; Future -     POCT glycosylated hemoglobin (Hb A1C) -     POCT Glucose (Device for Home Use) -     Dapagliflozin-sAXagliptin (QTERN) 10-5 MG TABS; Take 1 tablet by mouth daily. -     Ambulatory referral to Endocrinology  Essential hypertension- Her blood pressure is not adequately well controlled but I anticipate with the addition of an SGLT2 inhibitor that her blood pressure will come down to the goal of less than 130/80. -     Basic metabolic panel; Future  Visit for screening mammogram -     MM DIGITAL SCREENING BILATERAL; Future  Need for Tdap vaccination -     Tdap vaccine greater than or equal to 7yo IM  Chronic renal disease, stage 3, moderately decreased glomerular filtration rate (GFR) between 30-59 mL/min/1.73 square meter ([place])- Her renal function is stable.  She will avoid nephrotoxic agents.  I will continue to try to get better control of her blood sugars and her blood pressure.   I am having Aldine Contes "Gerald Stabs" start on Dapagliflozin-sAXagliptin. I am also having her maintain her aspirin, Omega-3, desvenlafaxine, cetirizine, cyanocobalamin, Vitamin D3, vitamin  C, ONETOUCH VERIO, omeprazole, lamoTRIgine, zolpidem, lisinopril-hydrochlorothiazide, ATROVENT HFA, levothyroxine, Insulin Glargine, insulin lispro, glucose blood, ONETOUCH DELICA LANCETS 23F, rosuvastatin, Insulin Pen Needle, ALPRAZolam, penicillin v potassium, metoprolol tartrate, and gabapentin.  Meds ordered this encounter  Medications  . Dapagliflozin-sAXagliptin (QTERN) 10-5 MG TABS    Sig: Take 1 tablet by mouth daily.    Dispense:  30 tablet    Refill:  3     Follow-up: Return in about 4 months (around 09/19/2018).  Scarlette Calico, MD

## 2018-05-20 ENCOUNTER — Encounter: Payer: Self-pay | Admitting: Internal Medicine

## 2018-05-20 LAB — TSH: TSH: 2.35 u[IU]/mL (ref 0.35–4.50)

## 2018-06-02 ENCOUNTER — Other Ambulatory Visit: Payer: Self-pay | Admitting: Internal Medicine

## 2018-06-02 DIAGNOSIS — E039 Hypothyroidism, unspecified: Secondary | ICD-10-CM

## 2018-06-02 MED ORDER — LEVOTHYROXINE SODIUM 50 MCG PO TABS: 50.0000 ug | ORAL_TABLET | Freq: Every day | ORAL | 1 refills | Status: DC

## 2018-06-04 LAB — HM MAMMOGRAPHY

## 2018-06-05 ENCOUNTER — Other Ambulatory Visit: Payer: Self-pay | Admitting: Internal Medicine

## 2018-06-05 DIAGNOSIS — E11319 Type 2 diabetes mellitus with unspecified diabetic retinopathy without macular edema: Secondary | ICD-10-CM

## 2018-06-06 ENCOUNTER — Ambulatory Visit: Payer: Medicare Other

## 2018-06-16 ENCOUNTER — Encounter: Payer: Self-pay | Admitting: Obstetrics and Gynecology

## 2018-06-16 ENCOUNTER — Ambulatory Visit: Payer: Medicare Other | Admitting: Obstetrics and Gynecology

## 2018-06-18 ENCOUNTER — Ambulatory Visit: Payer: Medicare Other | Admitting: Obstetrics and Gynecology

## 2018-06-26 ENCOUNTER — Other Ambulatory Visit: Payer: Self-pay | Admitting: Internal Medicine

## 2018-06-26 DIAGNOSIS — I10 Essential (primary) hypertension: Secondary | ICD-10-CM

## 2018-06-26 DIAGNOSIS — E785 Hyperlipidemia, unspecified: Secondary | ICD-10-CM

## 2018-06-26 DIAGNOSIS — E11319 Type 2 diabetes mellitus with unspecified diabetic retinopathy without macular edema: Secondary | ICD-10-CM

## 2018-06-26 MED ORDER — ROSUVASTATIN CALCIUM 20 MG PO TABS: 20.0000 mg | ORAL_TABLET | Freq: Every day | ORAL | 1 refills | Status: DC

## 2018-07-11 ENCOUNTER — Other Ambulatory Visit: Payer: Self-pay | Admitting: Internal Medicine

## 2018-07-11 DIAGNOSIS — E785 Hyperlipidemia, unspecified: Secondary | ICD-10-CM

## 2018-07-11 DIAGNOSIS — E11319 Type 2 diabetes mellitus with unspecified diabetic retinopathy without macular edema: Secondary | ICD-10-CM

## 2018-07-25 ENCOUNTER — Encounter: Payer: Self-pay | Admitting: Internal Medicine

## 2018-07-25 DIAGNOSIS — E1129 Type 2 diabetes mellitus with other diabetic kidney complication: Secondary | ICD-10-CM

## 2018-07-25 DIAGNOSIS — E1165 Type 2 diabetes mellitus with hyperglycemia: Principal | ICD-10-CM

## 2018-07-25 DIAGNOSIS — IMO0002 Reserved for concepts with insufficient information to code with codable children: Secondary | ICD-10-CM

## 2018-07-25 MED ORDER — QTERN 10-5 MG PO TABS: 1.0000 | ORAL_TABLET | Freq: Every day | ORAL | 0 refills | Status: DC

## 2018-07-28 ENCOUNTER — Other Ambulatory Visit: Payer: Self-pay | Admitting: Internal Medicine

## 2018-07-28 ENCOUNTER — Telehealth: Payer: Self-pay | Admitting: *Deleted

## 2018-07-28 DIAGNOSIS — E1165 Type 2 diabetes mellitus with hyperglycemia: Principal | ICD-10-CM

## 2018-07-28 DIAGNOSIS — E1129 Type 2 diabetes mellitus with other diabetic kidney complication: Secondary | ICD-10-CM

## 2018-07-28 DIAGNOSIS — IMO0002 Reserved for concepts with insufficient information to code with codable children: Secondary | ICD-10-CM

## 2018-07-28 MED ORDER — EMPAGLIFLOZIN-LINAGLIPTIN 25-5 MG PO TABS: 1.0000 | ORAL_TABLET | Freq: Every day | ORAL | 1 refills | Status: DC

## 2018-07-28 NOTE — Telephone Encounter (Signed)
Left detailed mess informing pt of medication change.

## 2018-07-28 NOTE — Telephone Encounter (Signed)
changed

## 2018-07-28 NOTE — Telephone Encounter (Signed)
Meredith Hayden requires PA. Plan prefers [facility]. Please advise.  ID: [id number] GRP: [name]: [id number] PCN: [id number]

## 2018-08-04 ENCOUNTER — Telehealth: Payer: Self-pay | Admitting: Obstetrics and Gynecology

## 2018-08-04 NOTE — Telephone Encounter (Signed)
Spoke with patient. Patient reports she has been experience a "flare" of LS since visiting the beach 1 mo ago. Reports external vaginal itching and "exudate". Reports skin is closed. No vaginal d/c, odor, pain, bleeding, fever/chills. Has tried Vaseline and clobetasol bid, nothing helps. Requesting OV.   Patient declined OV for 8/7, scheduled for 8/8 at 4pm with Dr. Quincy Simmonds.   Last AEX 10/29/16, Next AEX 08/15/18.  Routing to provider for final review. Patient is agreeable to disposition. Will close encounter.

## 2018-08-04 NOTE — Telephone Encounter (Signed)
Patient has a problem she would like to see Dr Quincy Simmonds about.

## 2018-08-07 ENCOUNTER — Telehealth: Payer: Self-pay | Admitting: Obstetrics and Gynecology

## 2018-08-07 ENCOUNTER — Ambulatory Visit: Payer: Self-pay | Admitting: Obstetrics and Gynecology

## 2018-08-07 ENCOUNTER — Encounter: Payer: Self-pay | Admitting: Obstetrics and Gynecology

## 2018-08-07 NOTE — Telephone Encounter (Signed)
Patient did not keep her appointment with Dr. Quincy Simmonds today for: Lichen sclerosus "flare." I called her and left a message to call back to reschedule this missed appointment.

## 2018-08-07 NOTE — Progress Notes (Deleted)
GYNECOLOGY  VISIT   HPI: 68 y.o.   Married  Caucasian  female   G2P2002 with Patient's last menstrual period was 70/17/7939.   here for lichen sclerosus.  GYNECOLOGIC HISTORY: Patient's last menstrual period was 12/31/1993. Contraception:  Post menopausal/hysterectomy Menopausal hormone therapy:  n/a Last mammogram:  03/08/2018 BIRAD 1, Negative Last pap smear:   2009 normal         OB History    Gravida  2   Para  2   Term  2   Preterm  0   AB  0   Living  2     SAB  0   TAB  0   Ectopic  0   Multiple  0   Live Births  2              Patient Active Problem List   Diagnosis Date Noted  . Chronic renal disease, stage 3, moderately decreased glomerular filtration rate (GFR) between 30-59 mL/min/1.73 square meter (HCC) 12/18/2017  . Medicare annual wellness visit, subsequent 03/06/2016  . Visit for screening mammogram 03/06/2016  . Diabetes mellitus with renal manifestations, uncontrolled ([place]) 02/21/2016  . Fatty liver disease, nonalcoholic 03/00/9233  . PSVT (paroxysmal supraventricular tachycardia) ([place])   . IRRITABLE BOWEL SYNDROME 03/05/2008  . DISORDER, BIPOLAR NEC 09/19/2007  . SLEEP APNEA 09/19/2007  . Hypothyroidism 08/26/2007  . OSTEOARTHRITIS 08/26/2007  . Hyperlipidemia LDL goal <130 10/31/2006  . OBESITY, MORBID 10/31/2006  . ANXIETY 10/31/2006  . Depression 10/31/2006  . Essential hypertension 10/31/2006  . ALLERGIC RHINITIS 10/31/2006  . ASTHMA 10/31/2006    Past Medical History:  Diagnosis Date  . Allergy   . Anxiety   . Arthritis   . Asthma   . Blood transfusion without reported diagnosis 2002   with knee replacement  . Depression   . Diabetes mellitus   . Diabetes mellitus type 2 with retinopathy ([place])   . Diverticulitis   . Dyspareunia    vaginal dryness  . Fibroid    reason for hysterectomy  . GERD (gastroesophageal reflux disease)   . Heart abnormality    prolonged P to T conductivity, partial right branch bundle  block  . Heart disease   . Hyperlipidemia   . Hypertension   . IBS (irritable bowel syndrome)   . Lichen sclerosus   . OSA (obstructive sleep apnea)   . Osteopenia 2009  . Psoriasis   . Sleep apnea   . STD (sexually transmitted disease)    Hx HPV  . Thyroid disease     Past Surgical History:  Procedure Laterality Date  . ABDOMINAL HYSTERECTOMY  1995   TAH/LSO--Dr. Ubaldo Glassing  . [place]  . FOOT SURGERY Bilateral   . NASAL SINUS SURGERY    . OOPHORECTOMY    . PARTIAL COLECTOMY     -RSO  . ROTATOR CUFF REPAIR Left   . TOTAL KNEE ARTHROPLASTY Bilateral 2002, 2004    Current Outpatient Medications  Medication Sig Dispense Refill  . ALPRAZolam (XANAX) 1 MG tablet Take 1 mg by mouth 3 (three) times daily as needed for anxiety.    . Ascorbic Acid (VITAMIN C) 1000 MG tablet Take 1,000 mg by mouth daily.    Marland Kitchen aspirin 81 MG tablet Take 81 mg by mouth daily.     . ATROVENT HFA 17 MCG/ACT inhaler USE 2 PUFFS EVERY 4 HOURS  AS NEEDED FOR WHEEZING. 12.9 g 0  . Blood Glucose Monitoring Suppl (ONETOUCH VERIO) W/DEVICE  KIT 1 each by Does not apply route daily. 1 kit 0  . cetirizine (ZYRTEC) 10 MG tablet Take 10 mg by mouth daily.     . Cholecalciferol (VITAMIN D3) 1000 UNITS CAPS Take 1 capsule by mouth daily.      . cyanocobalamin 100 MCG tablet Take 100 mcg by mouth daily.      Marland Kitchen desvenlafaxine (PRISTIQ) 100 MG 24 hr tablet Take 100 mg by mouth daily.      . Empagliflozin-linaGLIPtin (GLYXAMBI) 25-5 MG TABS Take 1 tablet by mouth daily. 90 tablet 1  . gabapentin (NEURONTIN) 100 MG capsule Take 200 mg by mouth 2 (two) times daily.    Marland Kitchen glucose blood (ONETOUCH VERIO) test strip CHECK BLOOD SUGAR QID. 200 each 11  . Insulin Glargine (TOUJEO SOLOSTAR) 300 UNIT/ML SOPN Inject 60 Units as directed daily. 15 pen 0  . insulin lispro (HUMALOG KWIKPEN) 100 UNIT/ML KiwkPen Inject 0.1 mLs (10 Units total) into the skin 3 (three) times daily. 15 mL 11  . Insulin Pen Needle (INSUPEN PEN  NEEDLES) 33G X 4 MM MISC Inject 1 Act into the skin 4 (four) times daily - after meals and at bedtime. 200 each 11  . lamoTRIgine (LAMICTAL) 200 MG tablet Take 200 mg by mouth daily.     Marland Kitchen levothyroxine (SYNTHROID, LEVOTHROID) 50 MCG tablet Take 1 tablet (50 mcg total) by mouth daily. 90 tablet 1  . lisinopril-hydrochlorothiazide (PRINZIDE,ZESTORETIC) 20-12.5 MG tablet TAKE 1 TABLET BY MOUTH  DAILY 90 tablet 1  . metoprolol tartrate (LOPRESSOR) 25 MG tablet TAKE 1 TABLET BY MOUTH TWO  TIMES DAILY 180 tablet 0  . Omega-3 350 MG CAPS Take 350 mg by mouth daily.      Marland Kitchen omeprazole (PRILOSEC) 20 MG capsule Take 20 mg by mouth daily.     [name] DELICA LANCETS 62I MISC Inject 1 Act into the skin 4 (four) times daily - after meals and at bedtime. 200 each 11  . rosuvastatin (CRESTOR) 20 MG tablet TAKE 1 TABLET EACH DAY. 90 tablet 1  . zolpidem (AMBIEN) 10 MG tablet Take 10 mg by mouth at bedtime as needed for sleep.     No current facility-administered medications for this visit.      ALLERGIES: Oxycodone-acetaminophen; Codeine phosphate; Erythromycin; Metformin and related; Morphine sulfate; Oxycodone hcl; Sulfamethoxazole; and Tylenol [acetaminophen]  Family History  Problem Relation Age of Onset  . COPD Mother   . Diabetes Mother   . Heart attack Father 60  . Hypertension Father   . Heart attack Sister 74  . Diabetes Sister   . Hypertension Sister   . Diabetes Brother   . Hypertension Sister   . Diabetes Sister   . Hyperlipidemia Unknown        fhx  . Hypertension Unknown        fhx  . Cancer Unknown        ovarian/grandmother  . Ovarian cancer Maternal Aunt   . Diabetes Maternal Aunt   . Stomach cancer Maternal Grandmother   . Liver cancer Maternal Grandmother     Social History   Socioeconomic History  . Marital status: Married    Spouse name: Juanda Crumble  . Number of children: 2  . Years of education: 38  . Highest education level: Not on file  Occupational History  .  Occupation: disabled    Comment: orthopedic / psych  Social Needs  . Financial resource strain: Not hard at all  . Food insecurity:  Worry: Never true    Inability: Never true  . Transportation needs:    Medical: Not on file    Non-medical: Not on file  Tobacco Use  . Smoking status: Never Smoker  . Smokeless tobacco: Never Used  Substance and Sexual Activity  . Alcohol use: No    Alcohol/week: 0.0 standard drinks    Comment: stopped in 2018, was social   . Drug use: No  . Sexual activity: Never    Partners: Male    Birth control/protection: Surgical    Comment: TAH  Lifestyle  . Physical activity:    Days per week: Not on file    Minutes per session: Not on file  . Stress: Not on file  Relationships  . Social connections:    Talks on phone: Not on file    Gets together: Not on file    Attends religious service: Not on file    Active member of club or organization: Not on file    Attends meetings of clubs or organizations: Not on file    Relationship status: Not on file  . Intimate partner violence:    Fear of current or ex partner: Not on file    Emotionally abused: Not on file    Physically abused: Not on file    Forced sexual activity: Not on file  Other Topics Concern  . Not on file  Social History Narrative   Grew up in [place]. Currently resides in resides in a house with her husband. 4 dogs. Fun: Play with your dogs and be with grandchildren, needle work.    Denies any religious beliefs effecting healthcare.     Review of Systems  PHYSICAL EXAMINATION:    LMP 12/31/1993     General appearance: alert, cooperative and appears stated age Head: Normocephalic, without obvious abnormality, atraumatic Neck: no adenopathy, supple, symmetrical, trachea midline and thyroid normal to inspection and palpation Lungs: clear to auscultation bilaterally Breasts: normal appearance, no masses or tenderness, No nipple retraction or dimpling, No nipple discharge or bleeding,  No axillary or supraclavicular adenopathy Heart: regular rate and rhythm Abdomen: soft, non-tender, no masses,  no organomegaly Extremities: extremities normal, atraumatic, no cyanosis or edema Skin: Skin color, texture, turgor normal. No rashes or lesions Lymph nodes: Cervical, supraclavicular, and axillary nodes normal. No abnormal inguinal nodes palpated Neurologic: Grossly normal  Pelvic: External genitalia:  no lesions              Urethra:  normal appearing urethra with no masses, tenderness or lesions              Bartholins and Skenes: normal                 Vagina: normal appearing vagina with normal color and discharge, no lesions              Cervix: no lesions                Bimanual Exam:  Uterus:  normal size, contour, position, consistency, mobility, non-tender              Adnexa: no mass, fullness, tenderness              Rectal exam: {yes no:[id number]}.  Confirms.              Anus:  normal sphincter tone, no lesions  Chaperone was present for exam.  ASSESSMENT     PLAN     An After Visit Summary  was printed and given to the patient.  ______ minutes face to face time of which over 50% was spent in counseling.

## 2018-08-07 NOTE — Telephone Encounter (Signed)
Thank you for the update!

## 2018-08-11 ENCOUNTER — Encounter: Payer: Self-pay | Admitting: Internal Medicine

## 2018-08-13 ENCOUNTER — Other Ambulatory Visit: Payer: Self-pay

## 2018-08-13 MED ORDER — FREESTYLE LIBRE 14 DAY READER DEVI: 1.0000 | 3 refills | Status: DC

## 2018-08-13 MED ORDER — FREESTYLE LIBRE 14 DAY SENSOR MISC: 1.0000 | 3 refills | Status: DC

## 2018-08-13 NOTE — Patient Outreach (Signed)
[facility]) Care Management  08/13/2018  Meredith Hayden [birth year] [id number]   Medication Adherence call to Mrs. Meredith Hayden patient is due on Lisinopril/ Hctz 20/12.5 mg  spoke with patient she still has medication until next month patient said she will order it from [facility] when she is almost out. Mrs. Meredith Hayden is showing past due under [place].   [place] Management Direct Dial [phone]  Fax [phone] [email or web address]

## 2018-08-14 ENCOUNTER — Other Ambulatory Visit: Payer: Self-pay | Admitting: Internal Medicine

## 2018-08-14 DIAGNOSIS — I10 Essential (primary) hypertension: Secondary | ICD-10-CM

## 2018-08-15 ENCOUNTER — Other Ambulatory Visit: Payer: Self-pay

## 2018-08-15 ENCOUNTER — Ambulatory Visit (INDEPENDENT_AMBULATORY_CARE_PROVIDER_SITE_OTHER): Payer: Medicare Other | Admitting: Obstetrics and Gynecology

## 2018-08-15 ENCOUNTER — Encounter: Payer: Self-pay | Admitting: Obstetrics and Gynecology

## 2018-08-15 VITALS — BP 138/80 | HR 88 | Resp 18 | Ht 60.5 in | Wt 251.0 lb

## 2018-08-15 DIAGNOSIS — Z01419 Encounter for gynecological examination (general) (routine) without abnormal findings: Secondary | ICD-10-CM

## 2018-08-15 DIAGNOSIS — N760 Acute vaginitis: Secondary | ICD-10-CM

## 2018-08-15 DIAGNOSIS — M8580 Other specified disorders of bone density and structure, unspecified site: Secondary | ICD-10-CM

## 2018-08-15 MED ORDER — FLUCONAZOLE 150 MG PO TABS: 150.0000 mg | ORAL_TABLET | Freq: Once | ORAL | 0 refills | Status: AC

## 2018-08-15 MED ORDER — NYSTATIN 100000 UNIT/GM EX OINT: 1.0000 "application " | TOPICAL_OINTMENT | Freq: Two times a day (BID) | CUTANEOUS | 0 refills | Status: DC

## 2018-08-15 NOTE — Progress Notes (Signed)
Patient scheduled for Bone Density test and 3D Screening mammogram at [facility] on 10/07/18 at 1400. This is the first available day to have both testing done together. Agreeable to time/date/location.

## 2018-08-15 NOTE — Patient Instructions (Signed)

## 2018-08-15 NOTE — Progress Notes (Signed)
68 y.o. G93P2002 Married Caucasian female here for annual exam. Patient complains of having issues with Lichen sclerosis.  Daughter is here and asking if the patient needs to see a specialist regarding her lichen sclerosus. Patient states she has a hard time coming for visits because she suffers from depression and anxiety.   Having vulvar bleeding and discharge.  Almost out of the Clobetasol.  Using it twice daily for a week.  Also using on the back of her legs as well. Needs refill of xylocaine jelly.  Aspercream made the pain worse.   Dealing with psoriasis on her legs and in the axillary region.   HgbA1C down from 12 - 10.   PCP: Dr. Scarlette Calico Endocrinology:  Will be seen at [facility] next year.    Patient's last menstrual period was 12/31/1993.           Sexually active: No.  The current method of family planning is status post hysterectomy.    Exercising: No.  The patient does not participate in regular exercise at present. Smoker:  no  Health Maintenance: Pap:  2009 normal History of abnormal Pap:  Yes, years ago  MMG:  03-09-15 category b density birads 1:neg Colonoscopy: 2017 BMD:   2016   Result  osteopenia TDaP:  2019 HIV: --- Hep C: neg 2017 Screening Labs:  None.   reports that she has never smoked. She has never used smokeless tobacco. She reports that she does not drink alcohol or use drugs.  Past Medical History:  Diagnosis Date  . Allergy   . Anxiety   . Arthritis   . Asthma   . Blood transfusion without reported diagnosis 2002   with knee replacement  . Depression   . Diabetes mellitus   . Diabetes mellitus type 2 with retinopathy ([place])   . Diverticulitis   . Dyspareunia    vaginal dryness  . Fibroid    reason for hysterectomy  . GERD (gastroesophageal reflux disease)   . 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 Heart disease   . Hyperlipidemia   . Hypertension   . IBS (irritable bowel syndrome)    . Lichen sclerosus   . OSA (obstructive sleep apnea)   . Osteopenia 2009  . Psoriasis   . Sleep apnea   . STD (sexually transmitted disease)    Hx HPV  . Thyroid disease     Past Surgical History:  Procedure Laterality Date  . ABDOMINAL HYSTERECTOMY  1995   TAH/LSO--Dr. Ubaldo Glassing  . [place]  . FOOT SURGERY Bilateral   . NASAL SINUS SURGERY    . OOPHORECTOMY    . PARTIAL COLECTOMY     -RSO  . ROTATOR CUFF REPAIR Left   . TOTAL KNEE ARTHROPLASTY Bilateral 2002, 2004    Current Outpatient Medications  Medication Sig Dispense Refill  . ALPRAZolam (XANAX) 1 MG tablet Take 1 mg by mouth 3 (three) times daily as needed for anxiety.    . Ascorbic Acid (VITAMIN C) 1000 MG tablet Take 1,000 mg by mouth daily.    Marland Kitchen aspirin 81 MG tablet Take 81 mg by mouth daily.     . ATROVENT HFA 17 MCG/ACT inhaler USE 2 PUFFS EVERY 4 HOURS  AS NEEDED FOR WHEEZING. 12.9 g 0  . Blood Glucose Monitoring Suppl (ONETOUCH VERIO) W/DEVICE KIT 1 each by Does not apply route daily. 1 kit 0  . cetirizine (ZYRTEC) 10 MG  tablet Take 10 mg by mouth daily.     . Cholecalciferol (VITAMIN D3) 1000 UNITS CAPS Take 1 capsule by mouth daily.      . Continuous Blood Gluc Receiver (FREESTYLE LIBRE 14 DAY READER) DEVI 1 Device by Does not apply route every 14 (fourteen) days. 6 Device 3  . Continuous Blood Gluc Sensor (FREESTYLE LIBRE 14 DAY SENSOR) MISC 1 Device by Does not apply route every 14 (fourteen) days. 6 each 3  . cyanocobalamin 100 MCG tablet Take 100 mcg by mouth daily.      Marland Kitchen desvenlafaxine (PRISTIQ) 100 MG 24 hr tablet Take 100 mg by mouth daily.      . Empagliflozin-linaGLIPtin (GLYXAMBI) 25-5 MG TABS Take 1 tablet by mouth daily. 90 tablet 1  . gabapentin (NEURONTIN) 100 MG capsule Take 200 mg by mouth 2 (two) times daily.    Marland Kitchen glucose blood (ONETOUCH VERIO) test strip CHECK BLOOD SUGAR QID. 200 each 11  . Insulin Glargine (TOUJEO SOLOSTAR) 300 UNIT/ML SOPN Inject 60 Units as directed daily. 15  pen 0  . insulin lispro (HUMALOG KWIKPEN) 100 UNIT/ML KiwkPen Inject 0.1 mLs (10 Units total) into the skin 3 (three) times daily. 15 mL 11  . Insulin Pen Needle (INSUPEN PEN NEEDLES) 33G X 4 MM MISC Inject 1 Act into the skin 4 (four) times daily - after meals and at bedtime. 200 each 11  . lamoTRIgine (LAMICTAL) 200 MG tablet Take 200 mg by mouth daily.     Marland Kitchen levothyroxine (SYNTHROID, LEVOTHROID) 50 MCG tablet Take 1 tablet (50 mcg total) by mouth daily. 90 tablet 1  . lisinopril-hydrochlorothiazide (PRINZIDE,ZESTORETIC) 20-12.5 MG tablet TAKE 1 TABLET BY MOUTH  DAILY 90 tablet 1  . metoprolol tartrate (LOPRESSOR) 25 MG tablet TAKE 1 TABLET BY MOUTH TWO  TIMES DAILY 180 tablet 0  . Omega-3 350 MG CAPS Take 350 mg by mouth daily.      Marland Kitchen omeprazole (PRILOSEC) 20 MG capsule Take 20 mg by mouth daily.     [name] DELICA LANCETS 98P MISC Inject 1 Act into the skin 4 (four) times daily - after meals and at bedtime. 200 each 11  . rosuvastatin (CRESTOR) 20 MG tablet TAKE 1 TABLET EACH DAY. 90 tablet 1  . zolpidem (AMBIEN) 10 MG tablet Take 10 mg by mouth at bedtime as needed for sleep.     No current facility-administered medications for this visit.     Family History  Problem Relation Age of Onset  . COPD Mother   . Diabetes Mother   . Heart attack Father 51  . Hypertension Father   . Heart attack Sister 4  . Diabetes Sister   . Hypertension Sister   . Diabetes Brother   . Hypertension Sister   . Diabetes Sister   . Hyperlipidemia Unknown        fhx  . Hypertension Unknown        fhx  . Cancer Unknown        ovarian/grandmother  . Ovarian cancer Maternal Aunt   . Diabetes Maternal Aunt   . Stomach cancer Maternal Grandmother   . Liver cancer Maternal Grandmother     Review of Systems  All other systems reviewed and are negative.   Exam:   BP 138/80 (BP Location: Right Arm, Patient Position: Sitting, Cuff Size: Large)   Pulse 88   Resp 18   Ht 5' 0.5" (1.537 m)   Wt  251 lb (113.9 kg)   LMP 12/31/1993  BMI 48.21 kg/m     General appearance: alert, cooperative and appears stated age Head: Normocephalic, without obvious abnormality, atraumatic Neck: no adenopathy, supple, symmetrical, trachea midline and thyroid normal to inspection and palpation Lungs: clear to auscultation bilaterally Breasts: normal appearance, no masses or tenderness, No nipple retraction or dimpling, No nipple discharge or bleeding, No axillary or supraclavicular adenopathy Heart: regular rate and rhythm Abdomen: obese.  Abdomen is soft, non-tender; no masses, no organomegaly Extremities: extremities normal, atraumatic, no cyanosis or edema Skin: Skin color, texture, turgor normal. No rashes or lesions Lymph nodes: Cervical, supraclavicular, and axillary nodes normal. No abnormal inguinal nodes palpated Neurologic: Grossly normal  Pelvic: External genitalia:  Generalized erythema and creamy white discharge.  Some loss of architectural folds.              Urethra:  normal appearing urethra with no masses, tenderness or lesions              Bartholins and Skenes: normal                 Vagina: normal appearing vagina with normal color and discharge, no lesions              Cervix: absent.              Pap taken: No. Bimanual Exam:  Uterus:  absent              Adnexa: no mass, fullness, tenderness              Rectal exam: Yes.  .  Confirms.              Anus:  normal sphincter tone, no lesions  Chaperone was present for exam.  Assessment:   Well woman visit with normal exam. Status post hysterectomy.  TAH/LSO Status post RSO with colectomy.  Vulvitis.  Looks mostly like yeast today.  Also has a dx of lichen sclerosus.  DM, uncontrolled.   Osteopenia.  Plan: Mammogram screening and [facility] at [facility].  Recommended self breast awareness. Pap and HR HPV as above. Guidelines for Calcium, Vitamin D, regular exercise program including cardiovascular and weight  bearing exercise. Stop using the clobetasol for now. Affirm.  Nystatin ointment.  Diflucan 150 mg x 3 doses.  Back to dermatology for her psoriasis. We talked about patient's need for consistent follow up with me to see her progress for the vulvitis.  Recheck in 2 weeks.   Follow up annually and prn.   After visit summary provided.

## 2018-08-16 LAB — VAGINITIS/VAGINOSIS, DNA PROBE
Candida Species: NEGATIVE
GARDNERELLA VAGINALIS: NEGATIVE
TRICHOMONAS VAG: NEGATIVE

## 2018-08-29 ENCOUNTER — Other Ambulatory Visit: Payer: Self-pay | Admitting: Internal Medicine

## 2018-08-29 DIAGNOSIS — E11319 Type 2 diabetes mellitus with unspecified diabetic retinopathy without macular edema: Secondary | ICD-10-CM

## 2018-08-29 DIAGNOSIS — E039 Hypothyroidism, unspecified: Secondary | ICD-10-CM

## 2018-08-29 DIAGNOSIS — E785 Hyperlipidemia, unspecified: Secondary | ICD-10-CM

## 2018-09-02 ENCOUNTER — Other Ambulatory Visit: Payer: Self-pay | Admitting: Internal Medicine

## 2018-09-02 DIAGNOSIS — I10 Essential (primary) hypertension: Secondary | ICD-10-CM

## 2018-09-02 DIAGNOSIS — IMO0002 Reserved for concepts with insufficient information to code with codable children: Secondary | ICD-10-CM

## 2018-09-02 DIAGNOSIS — E1129 Type 2 diabetes mellitus with other diabetic kidney complication: Secondary | ICD-10-CM

## 2018-09-02 DIAGNOSIS — E1165 Type 2 diabetes mellitus with hyperglycemia: Secondary | ICD-10-CM

## 2018-09-02 DIAGNOSIS — E11319 Type 2 diabetes mellitus with unspecified diabetic retinopathy without macular edema: Secondary | ICD-10-CM

## 2018-09-02 MED ORDER — LISINOPRIL-HYDROCHLOROTHIAZIDE 20-12.5 MG PO TABS: 1.0000 | ORAL_TABLET | Freq: Every day | ORAL | 1 refills | Status: DC

## 2018-09-04 ENCOUNTER — Ambulatory Visit: Payer: Medicare Other | Admitting: Obstetrics and Gynecology

## 2018-09-04 ENCOUNTER — Telehealth: Payer: Self-pay | Admitting: Obstetrics and Gynecology

## 2018-09-04 NOTE — Telephone Encounter (Signed)
Patient cancelled her follow up appointment today because she is sick. Rescheduled to 9/9 at 10:30.

## 2018-09-05 NOTE — Progress Notes (Signed)
GYNECOLOGY  VISIT   HPI: 68 y.o.   Married  Caucasian  female   G2P2002 with Patient's last menstrual period was 12/31/1993.   here for follow-up.  States her vulva is feeling better. Vulva is now not sore to touch.  Is is now dry and back to being tender in her tummy fold.   Noted her urine is spraying.   Seen for vaginitis on 08/15/18.  Treated with Diflucan q 72 hours x 3 doses.  Also received Rx for Nystatin ointment.  Told to stop using clobetasol.  She has known psoriasis.   Has lichen sclerosus and uncontrolled diabetes.  Had been using the clobetasol not even once a month for a flare.  Having episodes of mania.  Missed her appointment last week due to this.  Not taking Xanax daily.  Gave up caffeine.  [place] out of bed.  Has bruising on her chest and left upper arm.   GYNECOLOGIC HISTORY: Patient's last menstrual period was 12/31/1993. Contraception: Hysterectomy Menopausal hormone therapy:  none Last mammogram:  10/07/2018 Last pap smear: 2009 normal         OB History    Gravida  2   Para  2   Term  2   Preterm  0   AB  0   Living  2     SAB  0   TAB  0   Ectopic  0   Multiple  0   Live Births  2              Patient Active Problem List   Diagnosis Date Noted  . Chronic renal disease, stage 3, moderately decreased glomerular filtration rate (GFR) between 30-59 mL/min/1.73 square meter (HCC) 12/18/2017  . Medicare annual wellness visit, subsequent 03/06/2016  . Visit for screening mammogram 03/06/2016  . Diabetes mellitus with renal manifestations, uncontrolled ([place]) 02/21/2016  . Fatty liver disease, nonalcoholic 65/46/5035  . PSVT (paroxysmal supraventricular tachycardia) ([place])   . IRRITABLE BOWEL SYNDROME 03/05/2008  . DISORDER, BIPOLAR NEC 09/19/2007  . SLEEP APNEA 09/19/2007  . Hypothyroidism 08/26/2007  . OSTEOARTHRITIS 08/26/2007  . Hyperlipidemia LDL goal <130 10/31/2006  . OBESITY, MORBID 10/31/2006  . ANXIETY  10/31/2006  . Depression 10/31/2006  . Essential hypertension 10/31/2006  . ALLERGIC RHINITIS 10/31/2006  . ASTHMA 10/31/2006    Past Medical History:  Diagnosis Date  . Allergy   . Anxiety   . Arthritis   . Asthma   . Blood transfusion without reported diagnosis 2002   with knee replacement  . Depression   . Diabetes mellitus   . Diabetes mellitus type 2 with retinopathy ([place])   . Diverticulitis   . Dyspareunia    vaginal dryness  . Fibroid    reason for hysterectomy  . GERD (gastroesophageal reflux disease)   . 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 Heart disease   . Hyperlipidemia   . Hypertension   . IBS (irritable bowel syndrome)   . Lichen sclerosus   . OSA (obstructive sleep apnea)   . Osteopenia 2009  . Psoriasis   . Sleep apnea   . STD (sexually transmitted disease)    Hx HPV  . Thyroid disease     Past Surgical History:  Procedure Laterality Date  . ABDOMINAL HYSTERECTOMY  1995   TAH/LSO--Dr. Ubaldo Glassing  . [place]  . FOOT SURGERY Bilateral   . NASAL SINUS SURGERY    .  OOPHORECTOMY    . PARTIAL COLECTOMY     -RSO  . ROTATOR CUFF REPAIR Left   . TOTAL KNEE ARTHROPLASTY Bilateral 2002, 2004    Current Outpatient Medications  Medication Sig Dispense Refill  . ALPRAZolam (XANAX) 1 MG tablet Take 1 mg by mouth 3 (three) times daily as needed for anxiety.    . Ascorbic Acid (VITAMIN C) 1000 MG tablet Take 1,000 mg by mouth daily.    Marland Kitchen aspirin 81 MG tablet Take 81 mg by mouth daily.     . ATROVENT HFA 17 MCG/ACT inhaler USE 2 PUFFS EVERY 4 HOURS  AS NEEDED FOR WHEEZING. 12.9 g 0  . Blood Glucose Monitoring Suppl (ONETOUCH VERIO) W/DEVICE KIT 1 each by Does not apply route daily. 1 kit 0  . cetirizine (ZYRTEC) 10 MG tablet Take 10 mg by mouth daily.     . Cholecalciferol (VITAMIN D3) 1000 UNITS CAPS Take 1 capsule by mouth daily.      . Continuous Blood Gluc Receiver (FREESTYLE LIBRE 14 DAY  READER) DEVI 1 Device by Does not apply route every 14 (fourteen) days. 6 Device 3  . Continuous Blood Gluc Sensor (FREESTYLE LIBRE 14 DAY SENSOR) MISC 1 Device by Does not apply route every 14 (fourteen) days. 6 each 3  . cyanocobalamin 100 MCG tablet Take 100 mcg by mouth daily.      Marland Kitchen desvenlafaxine (PRISTIQ) 100 MG 24 hr tablet Take 100 mg by mouth daily.      . Empagliflozin-linaGLIPtin (GLYXAMBI) 25-5 MG TABS Take 1 tablet by mouth daily. 90 tablet 1  . gabapentin (NEURONTIN) 100 MG capsule Take 200 mg by mouth 2 (two) times daily.    Marland Kitchen glucose blood (ONETOUCH VERIO) test strip CHECK BLOOD SUGAR QID. 200 each 11  . insulin lispro (HUMALOG KWIKPEN) 100 UNIT/ML KiwkPen Inject 0.1 mLs (10 Units total) into the skin 3 (three) times daily. 15 mL 11  . Insulin Pen Needle (INSUPEN PEN NEEDLES) 33G X 4 MM MISC Inject 1 Act into the skin 4 (four) times daily - after meals and at bedtime. 200 each 11  . lamoTRIgine (LAMICTAL) 200 MG tablet Take 200 mg by mouth daily.     Marland Kitchen lisinopril-hydrochlorothiazide (PRINZIDE,ZESTORETIC) 20-12.5 MG tablet Take 1 tablet by mouth daily. 90 tablet 1  . metoprolol tartrate (LOPRESSOR) 25 MG tablet TAKE 1 TABLET BY MOUTH TWO  TIMES DAILY 180 tablet 0  . Omega-3 350 MG CAPS Take 350 mg by mouth daily.      Marland Kitchen omeprazole (PRILOSEC) 20 MG capsule Take 20 mg by mouth daily.     [name] DELICA LANCETS 96Q MISC Inject 1 Act into the skin 4 (four) times daily - after meals and at bedtime. 200 each 11  . rosuvastatin (CRESTOR) 20 MG tablet TAKE 1 TABLET EACH DAY. 90 tablet 1  . SYNTHROID 50 MCG tablet TAKE 1 TABLET BY MOUTH  DAILY 90 tablet 0  . TOUJEO SOLOSTAR 300 UNIT/ML SOPN INJECT 60 UNITS INTO THE SKIN DAILY. 4.5 mL 0  . zolpidem (AMBIEN) 10 MG tablet Take 10 mg by mouth at bedtime as needed for sleep.     No current facility-administered medications for this visit.      ALLERGIES: Oxycodone-acetaminophen; Trulicity [dulaglutide]; Codeine phosphate; Erythromycin;  Metformin and related; Morphine sulfate; Oxycodone hcl; Sulfamethoxazole; and Tylenol [acetaminophen]  Family History  Problem Relation Age of Onset  . COPD Mother   . Diabetes Mother   . Heart attack Father 43  .  Hypertension Father   . Heart attack Sister 38  . Diabetes Sister   . Hypertension Sister   . Diabetes Brother   . Hypertension Sister   . Diabetes Sister   . Hyperlipidemia Unknown        fhx  . Hypertension Unknown        fhx  . Cancer Unknown        ovarian/grandmother  . Ovarian cancer Maternal Aunt   . Diabetes Maternal Aunt   . Stomach cancer Maternal Grandmother   . Liver cancer Maternal Grandmother     Social History   Socioeconomic History  . Marital status: Married    Spouse name: Juanda Crumble  . Number of children: 2  . Years of education: 90  . Highest education level: Not on file  Occupational History  . Occupation: disabled    Comment: orthopedic / psych  Social Needs  . Financial resource strain: Not hard at all  . Food insecurity:    Worry: Never true    Inability: Never true  . Transportation needs:    Medical: Not on file    Non-medical: Not on file  Tobacco Use  . Smoking status: Never Smoker  . Smokeless tobacco: Never Used  Substance and Sexual Activity  . Alcohol use: No    Alcohol/week: 0.0 standard drinks    Comment: stopped in 2018, was social   . Drug use: No  . Sexual activity: Not Currently    Partners: Male    Birth control/protection: Surgical    Comment: TAH  Lifestyle  . Physical activity:    Days per week: Not on file    Minutes per session: Not on file  . Stress: Not on file  Relationships  . Social connections:    Talks on phone: Not on file    Gets together: Not on file    Attends religious service: Not on file    Active member of club or organization: Not on file    Attends meetings of clubs or organizations: Not on file    Relationship status: Not on file  . Intimate partner violence:    Fear of current  or ex partner: Not on file    Emotionally abused: Not on file    Physically abused: Not on file    Forced sexual activity: Not on file  Other Topics Concern  . Not on file  Social History Narrative   Grew up in [place]. Currently resides in resides in a house with her husband. 4 dogs. Fun: Play with your dogs and be with grandchildren, needle work.    Denies any religious beliefs effecting healthcare.     Review of Systems  All other systems reviewed and are negative.   PHYSICAL EXAMINATION:    BP (!) 146/78 (BP Location: Right Arm, Patient Position: Sitting, Cuff Size: Large)   Pulse 84   Resp 16   Ht 5' 0.5" (1.537 m)   Wt 249 lb (112.9 kg)   LMP 12/31/1993   BMI 47.83 kg/m     General appearance: alert, cooperative and appears stated age   Abdomen:  Erythema of flexural folds.  Hyperpigmentation of skin.  Pelvic: External genitalia:  Labia minor and labia majora are fused.  She has pale skin change on the vulva. Petechiae are present.               Urethra:  normal appearing urethra with no masses, tenderness or lesions  Bartholins and Skenes: normal                 Vagina: normal appearing vagina with normal color and discharge, no lesions              Cervix: no lesions                Bimanual Exam:  Uterus:  normal size, contour, position, consistency, mobility, non-tender.  Exam limited by [facility].               Adnexa: no mass, fullness, tenderness              Anus:  Perianal erythema and fissure noted.   Chaperone was present for exam.  ASSESSMENT  Lichen sclerosus.  Candida of flexural folds.  Psoriasis.  DM - uncontrolled.  PLAN  Nystatin powder.  Use clobetasol ointment once a week at night.  She will follow up with dermatology.  I recommended good blood sugar control.  FU for annual exam and prn.    An After Visit Summary was printed and given to the patient.  __15____ minutes face to face time of which over 50% was spent in counseling.

## 2018-09-05 NOTE — Telephone Encounter (Signed)
Thank you for the update.  Encounter closed. 

## 2018-09-08 ENCOUNTER — Encounter: Payer: Self-pay | Admitting: Obstetrics and Gynecology

## 2018-09-08 ENCOUNTER — Ambulatory Visit: Payer: Medicare Other | Admitting: Obstetrics and Gynecology

## 2018-09-08 VITALS — BP 146/78 | HR 84 | Resp 16 | Ht 60.5 in | Wt 249.0 lb

## 2018-09-08 DIAGNOSIS — L900 Lichen sclerosus et atrophicus: Secondary | ICD-10-CM

## 2018-09-08 DIAGNOSIS — B372 Candidiasis of skin and nail: Secondary | ICD-10-CM

## 2018-09-08 MED ORDER — NYSTATIN 100000 UNIT/GM EX POWD: Freq: Three times a day (TID) | CUTANEOUS | 1 refills | Status: DC

## 2018-09-10 LAB — HM PAP SMEAR

## 2018-09-26 ENCOUNTER — Other Ambulatory Visit: Payer: Self-pay | Admitting: Internal Medicine

## 2018-09-26 DIAGNOSIS — E11319 Type 2 diabetes mellitus with unspecified diabetic retinopathy without macular edema: Secondary | ICD-10-CM

## 2018-10-07 ENCOUNTER — Inpatient Hospital Stay: Admission: RE | Admit: 2018-10-07 | Payer: Medicare Other | Source: Ambulatory Visit

## 2018-10-07 ENCOUNTER — Ambulatory Visit: Payer: Medicare Other

## 2018-10-17 ENCOUNTER — Other Ambulatory Visit: Payer: Self-pay | Admitting: Internal Medicine

## 2018-10-17 DIAGNOSIS — I10 Essential (primary) hypertension: Secondary | ICD-10-CM

## 2018-10-18 ENCOUNTER — Other Ambulatory Visit: Payer: Self-pay | Admitting: Internal Medicine

## 2018-10-18 DIAGNOSIS — E11319 Type 2 diabetes mellitus with unspecified diabetic retinopathy without macular edema: Secondary | ICD-10-CM

## 2018-11-16 ENCOUNTER — Other Ambulatory Visit: Payer: Self-pay | Admitting: Internal Medicine

## 2018-11-16 DIAGNOSIS — E11319 Type 2 diabetes mellitus with unspecified diabetic retinopathy without macular edema: Secondary | ICD-10-CM

## 2018-11-25 ENCOUNTER — Ambulatory Visit (INDEPENDENT_AMBULATORY_CARE_PROVIDER_SITE_OTHER): Payer: Medicare Other | Admitting: *Deleted

## 2018-11-25 DIAGNOSIS — Z23 Encounter for immunization: Secondary | ICD-10-CM | POA: Diagnosis not present

## 2018-12-04 ENCOUNTER — Other Ambulatory Visit: Payer: Medicare Other

## 2018-12-04 ENCOUNTER — Ambulatory Visit: Payer: Medicare Other

## 2018-12-08 ENCOUNTER — Other Ambulatory Visit: Payer: Self-pay | Admitting: Internal Medicine

## 2018-12-08 DIAGNOSIS — E11319 Type 2 diabetes mellitus with unspecified diabetic retinopathy without macular edema: Secondary | ICD-10-CM

## 2018-12-10 ENCOUNTER — Other Ambulatory Visit: Payer: Self-pay

## 2018-12-10 NOTE — Patient Outreach (Signed)
[place] [facility]) Care Management  12/10/2018  Meredith Hayden 05/17/1950 [id number]   Medication Adherence call to Meredith Hayden left a message for patient to call back patient is due on Lisinopril/HCTZ 20/12.5 mg.Meredith Hayden is showing past due under [place].   [place] Management Direct Dial [phone]  Fax [phone] [email or web address]

## 2018-12-11 ENCOUNTER — Encounter

## 2018-12-27 ENCOUNTER — Other Ambulatory Visit: Payer: Self-pay | Admitting: Internal Medicine

## 2018-12-29 ENCOUNTER — Other Ambulatory Visit: Payer: Self-pay

## 2018-12-29 NOTE — Patient Outreach (Signed)
[place] [facility]) Care Management  12/29/2018  Meredith Hayden 1950-04-25 [id number]   Medication Adherence call to [name] left a message for patient to call back patientt is due on Lisinopril/HCTZ 20/12.5 mg. [name] is showing past due under [place].   [place] Management Direct Dial [phone]  Fax [phone] [email or web address]

## 2019-01-02 ENCOUNTER — Ambulatory Visit: Payer: Medicare Other

## 2019-01-06 ENCOUNTER — Other Ambulatory Visit: Payer: Self-pay | Admitting: Internal Medicine

## 2019-01-06 DIAGNOSIS — E039 Hypothyroidism, unspecified: Secondary | ICD-10-CM

## 2019-01-07 ENCOUNTER — Encounter: Payer: Self-pay | Admitting: Internal Medicine

## 2019-01-07 ENCOUNTER — Ambulatory Visit (INDEPENDENT_AMBULATORY_CARE_PROVIDER_SITE_OTHER): Payer: Medicare Other | Admitting: *Deleted

## 2019-01-07 ENCOUNTER — Ambulatory Visit (INDEPENDENT_AMBULATORY_CARE_PROVIDER_SITE_OTHER): Payer: Medicare Other | Admitting: Internal Medicine

## 2019-01-07 ENCOUNTER — Other Ambulatory Visit (INDEPENDENT_AMBULATORY_CARE_PROVIDER_SITE_OTHER): Payer: Medicare Other

## 2019-01-07 VITALS — BP 136/76 | HR 66 | Temp 98.6°F | Ht 61.0 in | Wt 242.0 lb

## 2019-01-07 VITALS — BP 136/76 | HR 66 | Temp 98.6°F | Resp 16 | Ht 61.0 in | Wt 242.0 lb

## 2019-01-07 DIAGNOSIS — E039 Hypothyroidism, unspecified: Secondary | ICD-10-CM | POA: Diagnosis not present

## 2019-01-07 DIAGNOSIS — I10 Essential (primary) hypertension: Secondary | ICD-10-CM

## 2019-01-07 DIAGNOSIS — Z0000 Encounter for general adult medical examination without abnormal findings: Secondary | ICD-10-CM | POA: Diagnosis not present

## 2019-01-07 DIAGNOSIS — R8281 Pyuria: Secondary | ICD-10-CM

## 2019-01-07 DIAGNOSIS — Z23 Encounter for immunization: Secondary | ICD-10-CM | POA: Diagnosis not present

## 2019-01-07 DIAGNOSIS — IMO0002 Reserved for concepts with insufficient information to code with codable children: Secondary | ICD-10-CM

## 2019-01-07 DIAGNOSIS — N183 Chronic kidney disease, stage 3 unspecified: Secondary | ICD-10-CM

## 2019-01-07 DIAGNOSIS — E1165 Type 2 diabetes mellitus with hyperglycemia: Secondary | ICD-10-CM

## 2019-01-07 DIAGNOSIS — E785 Hyperlipidemia, unspecified: Secondary | ICD-10-CM

## 2019-01-07 DIAGNOSIS — E1129 Type 2 diabetes mellitus with other diabetic kidney complication: Secondary | ICD-10-CM

## 2019-01-07 DIAGNOSIS — Z0001 Encounter for general adult medical examination with abnormal findings: Secondary | ICD-10-CM | POA: Diagnosis not present

## 2019-01-07 DIAGNOSIS — K581 Irritable bowel syndrome with constipation: Secondary | ICD-10-CM

## 2019-01-07 LAB — URINALYSIS, ROUTINE W REFLEX MICROSCOPIC
Bilirubin Urine: NEGATIVE
HGB URINE DIPSTICK: NEGATIVE
Ketones, ur: NEGATIVE
Nitrite: POSITIVE — AB
Specific Gravity, Urine: 1.02 (ref 1.000–1.030)
Total Protein, Urine: NEGATIVE
Urine Glucose: NEGATIVE
Urobilinogen, UA: 0.2 (ref 0.0–1.0)
pH: 5.5 (ref 5.0–8.0)

## 2019-01-07 LAB — LIPID PANEL
Cholesterol: 134 mg/dL (ref 0–200)
HDL: 41.1 mg/dL (ref >39.00)
NONHDL: 93.1
Total CHOL/HDL Ratio: 3
Triglycerides: 203 mg/dL — ABNORMAL HIGH (ref 0.0–149.0)
VLDL: 40.6 mg/dL — ABNORMAL HIGH (ref 0.0–40.0)

## 2019-01-07 LAB — COMPREHENSIVE METABOLIC PANEL
ALBUMIN: 4.2 g/dL (ref 3.5–5.2)
ALT: 12 U/L (ref 0–35)
AST: 20 U/L (ref 0–37)
Alkaline Phosphatase: 82 U/L (ref 39–117)
BUN: 20 mg/dL (ref 6–23)
CO2: 29 mEq/L (ref 19–32)
Calcium: 9.3 mg/dL (ref 8.4–10.5)
Chloride: 100 mEq/L (ref 96–112)
Creatinine, Ser: 1.36 mg/dL — ABNORMAL HIGH (ref 0.40–1.20)
GFR: 41.31 mL/min — ABNORMAL LOW (ref >60.00)
Glucose, Bld: 219 mg/dL — ABNORMAL HIGH (ref 70–99)
Potassium: 4.2 mEq/L (ref 3.5–5.1)
Sodium: 138 mEq/L (ref 135–145)
Total Bilirubin: 0.5 mg/dL (ref 0.2–1.2)
Total Protein: 7.1 g/dL (ref 6.0–8.3)

## 2019-01-07 LAB — CBC WITH DIFFERENTIAL/PLATELET
Basophils Absolute: 0.1 10*3/uL (ref 0.0–0.1)
Basophils Relative: 1.3 % (ref 0.0–3.0)
EOS ABS: 0.2 10*3/uL (ref 0.0–0.7)
Eosinophils Relative: 3 % (ref 0.0–5.0)
HEMATOCRIT: 40.9 % (ref 36.0–46.0)
Hemoglobin: 13.2 g/dL (ref 12.0–15.0)
LYMPHS PCT: 34.2 % (ref 12.0–46.0)
Lymphs Abs: 2.8 10*3/uL (ref 0.7–4.0)
MCHC: 32.3 g/dL (ref 30.0–36.0)
MCV: 87.7 fl (ref 78.0–100.0)
MONO ABS: 0.6 10*3/uL (ref 0.1–1.0)
Monocytes Relative: 7.2 % (ref 3.0–12.0)
Neutro Abs: 4.4 10*3/uL (ref 1.4–7.7)
Neutrophils Relative %: 54.3 % (ref 43.0–77.0)
Platelets: 224 10*3/uL (ref 150.0–400.0)
RBC: 4.67 Mil/uL (ref 3.87–5.11)
RDW: 15.1 % (ref 11.5–15.5)
WBC: 8.1 10*3/uL (ref 4.0–10.5)

## 2019-01-07 LAB — TSH: TSH: 1.56 u[IU]/mL (ref 0.35–4.50)

## 2019-01-07 LAB — MICROALBUMIN / CREATININE URINE RATIO
Creatinine,U: 160.8 mg/dL
Microalb Creat Ratio: 0.8 mg/g (ref 0.0–30.0)
Microalb, Ur: 1.3 mg/dL (ref 0.0–1.9)

## 2019-01-07 LAB — LDL CHOLESTEROL, DIRECT: Direct LDL: 69 mg/dL

## 2019-01-07 LAB — HEMOGLOBIN A1C: Hgb A1c MFr Bld: 10 % — ABNORMAL HIGH (ref 4.6–6.5)

## 2019-01-07 NOTE — Progress Notes (Addendum)
Subjective:   Meredith Hayden is a 69 y.o. female who presents for Medicare Annual (Subsequent) preventive examination.  Review of Systems:  No ROS.  Medicare Wellness Visit. Additional risk factors are reflected in the social history.  Cardiac Risk Factors include: advanced age (>55mn, >>67women);diabetes mellitus;dyslipidemia;hypertension;obesity (BMI >30kg/m2);sedentary lifestyle Sleep patterns: feels rested on waking, medications effective.  Home Safety/Smoke Alarms: Feels safe in home. Smoke alarms in place.  Living environment; residence and Firearm Safety: 1-story house/ trailer, equipment: CRadio producer Type: [place] Walkers, Type: RConservation officer, nature Pt. States she has some steps, but is able to manage currently. Seat Belt Safety/Bike Helmet: Wears seat belt.   Female:         Mammo- Done 03/2015, Due, pt is aware, stated she plans on following up     Dexa scan- Performed 03/2015, Due. Pt. Is aware, stated she plans on following up  CCS- Done 10/2016, due 10/2026  Medical screening examination/treatment/procedure(s) were performed by non-physician practitioner and as supervising physician I was immediately available for consultation/collaboration. I agree with above. TScarlette Calico MD     Objective:     Vitals: BP 136/76 (BP Location: Left Arm, Patient Position: Sitting, Cuff Size: Large)   Pulse 66   Temp 98.6 F (37 C)   Resp 16   Ht _0  (1.549 m)   Wt 242 lb (109.8 kg)   LMP 12/31/1993   SpO2 96%   BMI 45.73 kg/m   Body mass index is 45.73 kg/m.  Advanced Directives 01/07/2019 01/01/2018 11/14/2016 01/25/2015  Does Patient Have a Medical Advance Directive? No No No No  Does patient want to make changes to medical advance directive? - Yes (ED - Information included in AVS) - -  Would patient like information on creating a medical advance directive? No - Patient declined - - No - patient declined information    Tobacco Social History   Tobacco Use    Smoking Status Never Smoker  Smokeless Tobacco Never Used     Counseling given: Not Answered   Past Medical History:  Diagnosis Date  . Allergy   . Anxiety   . Arthritis   . Asthma   . Blood transfusion without reported diagnosis 2002   with knee replacement  . Depression   . Diabetes mellitus   . Diabetes mellitus type 2 with retinopathy ([facility]   . Diverticulitis   . Dyspareunia    vaginal dryness  . Fibroid    reason for hysterectomy  . GERD (gastroesophageal reflux disease)   . 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 Heart disease   . Hyperlipidemia   . Hypertension   . IBS (irritable bowel syndrome)   . Lichen sclerosus   . OSA (obstructive sleep apnea)   . Osteopenia 2009  . Psoriasis   . Sleep apnea   . STD (sexually transmitted disease)    Hx HPV  . Thyroid disease    Past Surgical History:  Procedure Laterality Date  . ABDOMINAL HYSTERECTOMY  1995   TAH/LSO--Dr. LUbaldo Glassing . [place] . FOOT SURGERY Bilateral   . NASAL SINUS SURGERY    . OOPHORECTOMY    . PARTIAL COLECTOMY     -RSO  . ROTATOR CUFF REPAIR Left   . TOTAL KNEE ARTHROPLASTY Bilateral 2002, 2004   Family History  Problem Relation Age of Onset  . COPD Mother   . Diabetes Mother   .  Heart attack Father 34  . Hypertension Father   . Heart attack Sister 42  . Diabetes Sister   . Hypertension Sister   . Diabetes Brother   . Hypertension Sister   . Diabetes Sister   . Hyperlipidemia Other        fhx  . Hypertension Other        fhx  . Cancer Other        ovarian/grandmother  . Ovarian cancer Maternal Aunt   . Diabetes Maternal Aunt   . Stomach cancer Maternal Grandmother   . Liver cancer Maternal Grandmother    Social History   Socioeconomic History  . Marital status: Married    Spouse name: Juanda Crumble  . Number of children: 2  . Years of education: 45  . Highest education level: Not on file  Occupational History   . Occupation: disabled    Comment: orthopedic / psych  Social Needs  . Financial resource strain: Not hard at all  . Food insecurity:    Worry: Never true    Inability: Never true  . Transportation needs:    Medical: No    Non-medical: No  Tobacco Use  . Smoking status: Never Smoker  . Smokeless tobacco: Never Used  Substance and Sexual Activity  . Alcohol use: No    Alcohol/week: 0.0 standard drinks    Comment: stopped in 2018, was social   . Drug use: No  . Sexual activity: Not Currently    Partners: Male    Birth control/protection: Surgical    Comment: TAH  Lifestyle  . Physical activity:    Days per week: 0 days    Minutes per session: 0 min  . Stress: To some extent  Relationships  . Social connections:    Talks on phone: Never    Gets together: Never    Attends religious service: Never    Active member of club or organization: No    Attends meetings of clubs or organizations: Never    Relationship status: Married  Other Topics Concern  . Not on file  Social History Narrative   Grew up in [place]. Currently resides in resides in a house with her husband. 4 dogs. Fun: Play with your dogs and be with grandchildren, needle work.    Denies any religious beliefs effecting healthcare.     Outpatient Encounter Medications as of 01/07/2019  Medication Sig  . ALPRAZolam (XANAX) 1 MG tablet Take 1 mg by mouth 3 (three) times daily as needed for anxiety.  . Ascorbic Acid (VITAMIN C) 1000 MG tablet Take 1,000 mg by mouth daily.  . ATROVENT HFA 17 MCG/ACT inhaler USE 2 PUFFS EVERY 4 HOURS  AS NEEDED FOR WHEEZING.  . BD PEN NEEDLE NANO U/F 32G X 4 MM MISC USE WITH INSULINE FOR DOSING 4 TIMES DAILY-AFTER MEALS AND AT BEDTIME.  Marland Kitchen Blood Glucose Monitoring Suppl (ONETOUCH VERIO) W/DEVICE KIT 1 each by Does not apply route daily.  . cetirizine (ZYRTEC) 10 MG tablet Take 10 mg by mouth daily.   . Cholecalciferol (VITAMIN D3) 1000 UNITS CAPS Take 1 capsule by mouth daily.    .  cyanocobalamin 100 MCG tablet Take 100 mcg by mouth daily.    Marland Kitchen desvenlafaxine (PRISTIQ) 100 MG 24 hr tablet Take 50 mg by mouth daily.   Marland Kitchen gabapentin (NEURONTIN) 100 MG capsule Take 600 mg by mouth 2 (two) times daily.   Marland Kitchen glucose blood (ONETOUCH VERIO) test strip CHECK BLOOD SUGAR QID.  Marland Kitchen insulin  lispro (HUMALOG KWIKPEN) 100 UNIT/ML KiwkPen Inject 0.1 mLs (10 Units total) into the skin 3 (three) times daily.  . Insulin Pen Needle (INSUPEN PEN NEEDLES) 33G X 4 MM MISC Inject 1 Act into the skin 4 (four) times daily - after meals and at bedtime.  Marland Kitchen levothyroxine (SYNTHROID, LEVOTHROID) 50 MCG tablet TAKE 1 TABLET BY MOUTH  DAILY  . lisinopril-hydrochlorothiazide (PRINZIDE,ZESTORETIC) 20-12.5 MG tablet Take 1 tablet by mouth daily.  . metoprolol tartrate (LOPRESSOR) 25 MG tablet TAKE 1 TABLET BY MOUTH TWO  TIMES DAILY  . Multiple Vitamins-Minerals (CENTRUM SILVER PO) Take 1 tablet by mouth daily.  . Omega-3 350 MG CAPS Take 350 mg by mouth daily as needed.   Marland Kitchen omeprazole (PRILOSEC) 20 MG capsule Take 20 mg by mouth daily.   [name] DELICA LANCETS 63A MISC Inject 1 Act into the skin 4 (four) times daily - after meals and at bedtime.  . rosuvastatin (CRESTOR) 20 MG tablet TAKE 1 TABLET EACH DAY.  Marland Kitchen TOUJEO SOLOSTAR 300 UNIT/ML SOPN INJECT 60 UNITS INTO THE SKIN DAILY.  Marland Kitchen vortioxetine HBr (TRINTELLIX) 20 MG TABS tablet Take 20 mg by mouth daily.  Marland Kitchen zolpidem (AMBIEN) 10 MG tablet Take 10 mg by mouth at bedtime as needed for sleep.  . [DISCONTINUED] nystatin (MYCOSTATIN/NYSTOP) powder Apply topically 3 (three) times daily. Apply to affected area for up to 7 days  . aspirin 81 MG tablet Take 81 mg by mouth daily.   . Continuous Blood Gluc Receiver (FREESTYLE LIBRE 14 DAY READER) DEVI 1 Device by Does not apply route every 14 (fourteen) days.  . Continuous Blood Gluc Sensor (FREESTYLE LIBRE 14 DAY SENSOR) MISC 1 Device by Does not apply route every 14 (fourteen) days.  . [DISCONTINUED] azelastine  (ASTELIN) 137 MCG/SPRAY nasal spray 1 spray by Nasal route 2 (two) times daily. Use in each nostril as directed   . [DISCONTINUED] budesonide-formoterol (SYMBICORT) 160-4.5 MCG/ACT inhaler Inhale 2 puffs into the lungs 2 (two) times daily.    . [DISCONTINUED] diphenhydrAMINE (SOMINEX) 25 MG tablet Take 25 mg by mouth at bedtime as needed.    . [DISCONTINUED] Empagliflozin-linaGLIPtin (GLYXAMBI) 25-5 MG TABS Take 1 tablet by mouth daily. (Patient not taking: Reported on 01/07/2019)  . [DISCONTINUED] fluticasone (FLONASE) 50 MCG/ACT nasal spray 2 sprays by Nasal route daily.    . [DISCONTINUED] lamoTRIgine (LAMICTAL) 200 MG tablet Take 200 mg by mouth daily.   . [DISCONTINUED] SYNTHROID 50 MCG tablet TAKE 1 TABLET BY MOUTH  DAILY  . [DISCONTINUED] temazepam (RESTORIL) 30 MG capsule Take 30 mg by mouth at bedtime as needed.   No facility-administered encounter medications on file as of 01/07/2019.     Activities of Daily Living In your present state of health, do you have any difficulty performing the following activities: 01/07/2019  Hearing? N  Vision? N  Difficulty concentrating or making decisions? N  Walking or climbing stairs? Y  Dressing or bathing? N  Doing errands, shopping? Y  Comment due to anxiety in leaving house  Preparing Food and eating ? N  Using the Toilet? N  Managing your Medications? N  Managing your Finances? N  Housekeeping or managing your Housekeeping? N  Some recent data might be hidden    Patient Care Team: Janith Lima, MD as PCP - General (Internal Medicine) Nunzio Cobbs, MD as Consulting Physician (Obstetrics and Gynecology) Irene Shipper, MD as Consulting Physician (Gastroenterology) Chucky May, MD as Consulting Physician (Psychiatry) Webb Laws, [place] as  Referring Physician (Optometry)    Assessment:   This is a routine wellness examination for Shereta. Physical assessment deferred to PCP.   Exercise Activities and Dietary  recommendations Current Exercise Habits: The patient does not participate in regular exercise at present(chair exercise print-outs provided), Exercise limited by: cardiac condition(s);neurologic condition(s);psychological condition(s);orthopedic condition(s) Diet (meal preparation, eat out, water intake, caffeinated beverages, dairy products, fruits and vegetables): in general, an "unhealthy" diet. Pt. States she has trouble limiting fats, although avoids saturated fats, and has been limiting foods to lower glycemic indexes (12m-8mg/serving). Pt. Also stated she is not eating after 8PM. Suggestion to keep snacks at bedside for episodes of hypoglycemia provided, and pt. stated "I don't do that, but I can tell when it's high or low, and my husband will help me get a snack if I need it".   Discussed weight loss tips. Relevant patient education assigned to patient using Emmi.  Pt. wants to lose weight via fasting, and PCP made aware via CMA that pt. wanted to discuss further with PCP.    Goals    . Patient Stated     Take my dogs walking and create a nutrition plan that works for me and my husband. I want to paint and remodel my house. Spending time with family and grandchildren. [place] a new year for [place]     . Patient Stated     "To be more mobile, doing chair exercises, walking in lifestyle. Visit grandchildren in [id number]       Fall Risk Fall Risk  01/07/2019 04/09/2018 01/01/2018  Falls in the past year? 1 Yes Yes  Number falls in past yr: 1 2 or more -  Injury with Fall? 1 Yes -  Comment - arm fx -  Risk for fall due to : History of fall(s);Impaired balance/gait;Medication side effect;Impaired vision Impaired balance/gait Impaired mobility;Impaired balance/gait  Follow up Education provided;Falls prevention discussed - -    Depression Screen PHQ 2/9 Scores 01/07/2019 01/01/2018  PHQ - 2 Score 2 4  PHQ- 9 Score 8 12     Cognitive Function MMSE - Mini Mental State Exam 01/01/2018  Not  completed: Refused       Ad8 score reviewed for issues:  Issues making decisions: no  Less interest in hobbies / activities: no  Repeats questions, stories (family complaining): no  Trouble using ordinary gadgets (microwave, computer, phone):no  Forgets the month or year: no  Mismanaging finances: no  Remembering appts: no  Daily problems with thinking and/or memory: no Ad8 score is= 0  Immunization History  Administered Date(s) Administered  . Hepatitis A, Adult 03/01/2015  . Influenza Split 09/19/2011  . Influenza Whole 12/08/2007, 10/07/2009, 10/13/2010  . Influenza, High Dose Seasonal PF 10/12/2016, 09/05/2017, 11/25/2018  . Influenza,inj,Quad PF,6+ Mos 12/09/2013, 09/30/2015  . Influenza-Unspecified 11/26/2014  . Pneumococcal Conjugate-13 01/01/2018  . Pneumococcal Polysaccharide-23 09/19/2011  . Td 09/19/2007  . Tdap 05/19/2018  . Zoster 07/07/2013   PNA-23 given, pt. tolerated well.   Screening Tests Health Maintenance  Topic Date Due  . HEMOGLOBIN A1C  11/19/2018  . FOOT EXAM  12/17/2018  . PNA vac Low Risk Adult (2 of 2 - PPSV23) 01/01/2019  . OPHTHALMOLOGY EXAM  05/10/2019  . MAMMOGRAM  06/04/2020  . COLONOSCOPY  11/14/2026  . TETANUS/TDAP  05/19/2028  . INFLUENZA VACCINE  Completed  . DEXA SCAN  Completed  . Hepatitis C Screening  Completed       Plan:    Continue doing brain stimulating  activities (puzzles, reading, adult coloring books, staying active) to keep memory sharp.   Continue taking medications as prescribed, particularly for BG control and mood stabilization.  Continue working on low cholesterol, low fat diet and follow up with PCP regarding diet change questions.  Begin at home chair exercises, and walking, and follow up with PCP regarding PT referral.  Follow up with mammogram and colonoscopy.  I have personally reviewed and noted the following in the patient's chart:   . Medical and social history . Use of alcohol,  tobacco or illicit drugs  . Current medications and supplements . Functional ability and status . Nutritional status . Physical activity . Advanced directives . List of other physicians . Vitals . Screenings to include cognitive, depression, and falls . Referrals and appointments  In addition, I have reviewed and discussed with patient certain preventive protocols, quality metrics, and best practice recommendations. A written personalized care plan for preventive services as well as general preventive health recommendations were provided to patient.     Alphia Moh, RN  01/07/2019  Medical screening examination/treatment/procedure(s) were performed by non-physician practitioner and as supervising physician I was immediately available for consultation/collaboration. I agree with above. Scarlette Calico, MD

## 2019-01-07 NOTE — Patient Instructions (Signed)
Preventive Care 67 Years and Older, Female Preventive care refers to lifestyle choices and visits with your health care provider that can promote health and wellness. What does preventive care include?  A yearly physical exam. This is also called an annual well check.  Dental exams once or twice a year.  Routine eye exams. Ask your health care provider how often you should have your eyes checked.  Personal lifestyle choices, including: ? Daily care of your teeth and gums. ? Regular physical activity. ? Eating a healthy diet. ? Avoiding tobacco and drug use. ? Limiting alcohol use. ? Practicing safe sex. ? Taking low-dose aspirin every day. ? Taking vitamin and mineral supplements as recommended by your health care provider. What happens during an annual well check? The services and screenings done by your health care provider during your annual well check will depend on your age, overall health, lifestyle risk factors, and family history of disease. Counseling Your health care provider may ask you questions about your:  Alcohol use.  Tobacco use.  Drug use.  Emotional well-being.  Home and relationship well-being.  Sexual activity.  Eating habits.  History of falls.  Memory and ability to understand (cognition).  Work and work Statistician.  Reproductive health.  Screening You may have the following tests or measurements:  Height, weight, and BMI.  Blood pressure.  Lipid and cholesterol levels. These may be checked every 5 years, or more frequently if you are over 30 years old.  Skin check.  Lung cancer screening. You may have this screening every year starting at age 67 if you have a 30-pack-year history of smoking and currently smoke or have quit within the past 15 years.  Colorectal cancer screening. All adults should have this screening starting at age 67 and continuing until age 67. You will have tests every 1-10 years, depending on your results and the  type of screening test. People at increased risk should start screening at an earlier age. Screening tests may include: ? Guaiac-based fecal occult blood testing. ? Fecal immunochemical test (FIT). ? Stool DNA test. ? Virtual colonoscopy. ? Sigmoidoscopy. During this test, a flexible tube with a tiny camera (sigmoidoscope) is used to examine your rectum and lower colon. The sigmoidoscope is inserted through your anus into your rectum and lower colon. ? Colonoscopy. During this test, a long, thin, flexible tube with a tiny camera (colonoscope) is used to examine your entire colon and rectum.  Hepatitis C blood test.  Hepatitis B blood test.  Sexually transmitted disease (STD) testing.  Diabetes screening. This is done by checking your blood sugar (glucose) after you have not eaten for a while (fasting). You may have this done every 1-3 years.  Bone density scan. This is done to screen for osteoporosis. You may have this done starting at age 67.  Mammogram. This may be done every 1-2 years. Talk to your health care provider about how often you should have regular mammograms. Talk with your health care provider about your test results, treatment options, and if necessary, the need for more tests. Vaccines Your health care provider may recommend certain vaccines, such as:  Influenza vaccine. This is recommended every year.  Tetanus, diphtheria, and acellular pertussis (Tdap, Td) vaccine. You may need a Td booster every 10 years.  Varicella vaccine. You may need this if you have not been vaccinated.  Zoster vaccine. You may need this after age 67.  Measles, mumps, and rubella (MMR) vaccine. You may need at least  one dose of MMR if you were born in 1957 or later. You may also need a second dose.  Pneumococcal 13-valent conjugate (PCV13) vaccine. One dose is recommended after age 24.  Pneumococcal polysaccharide (PPSV23) vaccine. One dose is recommended after age 24.  Meningococcal  vaccine. You may need this if you have certain conditions.  Hepatitis A vaccine. You may need this if you have certain conditions or if you travel or work in places where you may be exposed to hepatitis A.  Hepatitis B vaccine. You may need this if you have certain conditions or if you travel or work in places where you may be exposed to hepatitis B.  Haemophilus influenzae type b (Hib) vaccine. You may need this if you have certain conditions. Talk to your health care provider about which screenings and vaccines you need and how often you need them. This information is not intended to replace advice given to you by your health care provider. Make sure you discuss any questions you have with your health care provider. Document Released: 01/13/2016 Document Revised: 02/06/2018 Document Reviewed: 10/18/2015 Elsevier Interactive Patient Education  2019 [employer].

## 2019-01-07 NOTE — Patient Instructions (Addendum)
Meredith Hayden , Thank you for taking time to come for your Medicare Wellness Visit. I appreciate your ongoing commitment to your health goals. Please review the following plan we discussed and let me know if I can assist you in the future.   These are the goals we discussed: Goals    . Patient Stated     Take my dogs walking and create a nutrition plan that works for me and my husband. I want to paint and remodel my house. Spending time with family and grandchildren. Make a new year for [place].     . Patient Stated     "To be more mobile, doing chair exercises, walking in lifestyle. Visit grandchildren in [place]"       This is a list of the screening recommended for you and due dates:  Health Maintenance  Topic Date Due  . Hemoglobin A1C  11/19/2018  . Complete foot exam   12/17/2018  . Pneumonia vaccines (2 of 2 - PPSV23) 01/01/2019  . Eye exam for diabetics  05/10/2019  . Mammogram  06/04/2020  . Colon Cancer Screening  11/14/2026  . Tetanus Vaccine  05/19/2028  . Flu Shot  Completed  . DEXA scan (bone density measurement)  Completed  .  Hepatitis C: One time screening is recommended by Center for Disease Control  (CDC) for  adults born from [birth year] through 1963.   Completed      Continue doing brain stimulating activities (puzzles, reading, adult coloring books, staying active) to keep memory sharp.     Fat and Cholesterol Restricted Eating Plan Eating a diet that limits fat and cholesterol may help lower your risk for heart disease and other conditions. Your body needs fat and cholesterol for basic functions, but eating too much of these things can be harmful to your health. Your health care provider may order lab tests to check your blood fat (lipid) and cholesterol levels. This helps your health care provider understand your risk for certain conditions and whether you need to make diet changes. Work with your health care provider or dietitian to make an eating plan that is  right for you. Your plan includes:  Limit your fat intake to ______% or less of your total calories a day.  Limit your saturated fat intake to ______% or less of your total calories a day.  Limit the amount of cholesterol in your diet to less than _________mg a day.  Eat ___________ g of fiber a day. What are tips for following this plan? General guidelines   If you are overweight, work with your health care provider to lose weight safely. Losing just 5-10% of your body weight can improve your overall health and help prevent diseases such as diabetes and heart disease.  Avoid: ? Foods with added sugar. ? Fried foods. ? Foods that contain partially hydrogenated oils, including stick margarine, some tub margarines, cookies, crackers, and other baked goods.  Limit alcohol intake to no more than 1 drink a day for nonpregnant women and 2 drinks a day for men. One drink equals 12 oz of beer, 5 oz of wine, or 1 oz of hard liquor. Reading food labels  Check food labels for: ? Trans fats, partially hydrogenated oils, or high amounts of saturated fat. Avoid foods that contain saturated fat and trans fat. ? The amount of cholesterol in each serving. Try to eat no more than 200 mg of cholesterol each day. ? The amount of fiber in each serving. Try  to eat at least 20-30 g of fiber each day.  Choose foods with healthy fats, such as: ? Monounsaturated and polyunsaturated fats. These include olive and canola oil, flaxseeds, walnuts, almonds, and seeds. ? Omega-3 fats. These are found in foods such as salmon, mackerel, sardines, tuna, flaxseed oil, and ground flaxseeds.  Choose grain products that have whole grains. Look for the word "whole" as the first word in the ingredient list. Cooking  Cook foods using methods other than frying. Baking, boiling, grilling, and broiling are some healthy options.  Eat more home-cooked food and less restaurant, buffet, and fast food.  Avoid cooking using  saturated fats. ? Animal sources of saturated fats include meats, butter, and cream. ? Plant sources of saturated fats include palm oil, palm kernel oil, and coconut oil. Meal planning   At meals, imagine dividing your plate into fourths: ? Fill one-half of your plate with vegetables and green salads. ? Fill one-fourth of your plate with whole grains. ? Fill one-fourth of your plate with lean protein foods.  Eat fish that is high in omega-3 fats at least two times a week.  Eat more foods that contain fiber, such as whole grains, beans, apples, broccoli, carrots, peas, and barley. These foods help promote healthy cholesterol levels in the blood. Recommended foods Grains  Whole grains, such as whole wheat or whole grain breads, crackers, cereals, and pasta. Unsweetened oatmeal, bulgur, barley, quinoa, or brown rice. Corn or whole wheat flour tortillas. Vegetables  Fresh or frozen vegetables (raw, steamed, roasted, or grilled). Green salads. Fruits  All fresh, canned (in natural juice), or frozen fruits. Meats and other protein foods  Ground beef (85% or leaner), grass-fed beef, or beef trimmed of fat. Skinless chicken or [place]. Ground chicken or [place]. Pork trimmed of fat. All fish and seafood. Egg whites. Dried beans, peas, or lentils. Unsalted nuts or seeds. Unsalted canned beans. Natural nut butters without added sugar and oil. Dairy  Low-fat or nonfat dairy products, such as skim or 1% milk, 2% or reduced-fat cheeses, low-fat and fat-free ricotta or cottage cheese, or plain low-fat and nonfat yogurt. Fats and oils  Tub margarine without trans fats. Light or reduced-fat mayonnaise and salad dressings. Avocado. Olive, canola, sesame, or safflower oils. The items listed above may not be a complete list of recommended foods or beverages. Contact your dietitian for more options. Foods to avoid Grains  White bread. White pasta. White rice. Cornbread. Bagels, pastries, and  croissants. Crackers and snack foods that contain trans fat and hydrogenated oils. Vegetables  Vegetables cooked in cheese, cream, or butter sauce. Fried vegetables. Fruits  Canned fruit in heavy syrup. Fruit in cream or butter sauce. Fried fruit. Meats and other protein foods  Fatty cuts of meat. Ribs, chicken wings, bacon, sausage, bologna, salami, chitterlings, fatback, hot dogs, bratwurst, and packaged lunch meats. Liver and organ meats. Whole eggs and egg yolks. Chicken and [place] with skin. Fried meat. Dairy  Whole or 2% milk, cream, half-and-half, and cream cheese. Whole milk cheeses. Whole-fat or sweetened yogurt. Full-fat cheeses. Nondairy creamers and whipped toppings. Processed cheese, cheese spreads, and cheese curds. Beverages  Alcohol. Sugar-sweetened drinks such as sodas, lemonade, and fruit drinks. Fats and oils  Butter, stick margarine, lard, shortening, ghee, or bacon fat. Coconut, palm kernel, and palm oils. Sweets and desserts  Corn syrup, sugars, honey, and molasses. Candy. Jam and jelly. Syrup. Sweetened cereals. Cookies, pies, cakes, donuts, muffins, and ice cream. The items listed above may not be  a complete list of foods and beverages to avoid. Contact your dietitian for more information. Summary  Your body needs fat and cholesterol for basic functions. However, eating too much of these things can be harmful to your health.  Work with your health care provider and dietitian to follow a diet low in fat and cholesterol. Doing this may help lower your risk for heart disease and other conditions.  Choose healthy fats, such as monounsaturated and polyunsaturated fats, and foods high in omega-3 fatty acids.  Eat fiber-rich foods, such as whole grains, beans, peas, fruits, and vegetables.  Limit or avoid alcohol, fried foods, and foods high in saturated fats, partially hydrogenated oils, and sugar. This information is not intended to replace advice given to you by  your health care provider. Make sure you discuss any questions you have with your health care provider. Document Released: 12/17/2005 Document Revised: 09/03/2017 Document Reviewed: 09/03/2017 Elsevier Interactive Patient Education  2019 [employer]   Diabetes Mellitus and [place] care is an important part of your health, especially when you have diabetes. Diabetes may cause you to have problems because of poor blood flow (circulation) to your feet and legs, which can cause your skin to:  Become thinner and drier.  Break more easily.  Heal more slowly.  Peel and crack. You may also have nerve damage (neuropathy) in your legs and feet, causing decreased feeling in them. This means that you may not notice minor injuries to your feet that could lead to more serious problems. Noticing and addressing any potential problems early is the best way to prevent future foot problems. How to care for your feet Foot hygiene  Wash your feet daily with warm water and mild soap. Do not use hot water. Then, pat your feet and the areas between your toes until they are completely dry. Do not soak your feet as this can dry your skin.  Trim your toenails straight across. Do not dig under them or around the cuticle. File the edges of your nails with an emery board or nail file.  Apply a moisturizing lotion or petroleum jelly to the skin on your feet and to dry, brittle toenails. Use lotion that does not contain alcohol and is unscented. Do not apply lotion between your toes. Shoes and socks  Wear clean socks or stockings every day. Make sure they are not too tight. Do not wear knee-high stockings since they may decrease blood flow to your legs.  Wear shoes that fit properly and have enough cushioning. Always look in your shoes before you put them on to be sure there are no objects inside.  To break in new shoes, wear them for just a few hours a day. This prevents injuries on your feet. Wounds,  scrapes, corns, and calluses  Check your feet daily for blisters, cuts, bruises, sores, and redness. If you cannot see the bottom of your feet, use a mirror or ask someone for help.  Do not cut corns or calluses or try to remove them with medicine.  If you find a minor scrape, cut, or break in the skin on your feet, keep it and the skin around it clean and dry. You may clean these areas with mild soap and water. Do not clean the area with peroxide, alcohol, or iodine.  If you have a wound, scrape, corn, or callus on your foot, look at it several times a day to make sure it is healing and not infected. Check for: ?  Redness, swelling, or pain. ? Fluid or blood. ? Warmth. ? Pus or a bad smell. General instructions  Do not cross your legs. This may decrease blood flow to your feet.  Do not use heating pads or hot water bottles on your feet. They may burn your skin. If you have lost feeling in your feet or legs, you may not know this is happening until it is too late.  Protect your feet from hot and cold by wearing shoes, such as at the beach or on hot pavement.  Schedule a complete foot exam at least once a year (annually) or more often if you have foot problems. If you have foot problems, report any cuts, sores, or bruises to your health care provider immediately. Contact a health care provider if:  You have a medical condition that increases your risk of infection and you have any cuts, sores, or bruises on your feet.  You have an injury that is not healing.  You have redness on your legs or feet.  You feel burning or tingling in your legs or feet.  You have pain or cramps in your legs and feet.  Your legs or feet are numb.  Your feet always feel cold.  You have pain around a toenail. Get help right away if:  You have a wound, scrape, corn, or callus on your foot and: ? You have pain, swelling, or redness that gets worse. ? You have fluid or blood coming from the wound,  scrape, corn, or callus. ? Your wound, scrape, corn, or callus feels warm to the touch. ? You have pus or a bad smell coming from the wound, scrape, corn, or callus. ? You have a fever. ? You have a red line going up your leg. Summary  Check your feet every day for cuts, sores, red spots, swelling, and blisters.  Moisturize feet and legs daily.  Wear shoes that fit properly and have enough cushioning.  If you have foot problems, report any cuts, sores, or bruises to your health care provider immediately.  Schedule a complete foot exam at least once a year (annually) or more often if you have foot problems. This information is not intended to replace advice given to you by your health care provider. Make sure you discuss any questions you have with your health care provider. Document Released: 12/14/2000 Document Revised: 01/29/2018 Document Reviewed: 01/18/2017 Elsevier Interactive Patient Education  2019 [place] Maintenance, Female Adopting a healthy lifestyle and getting preventive care can go a long way to promote health and wellness. Talk with your health care provider about what schedule of regular examinations is right for you. This is a good chance for you to check in with your provider about disease prevention and staying healthy. In between checkups, there are plenty of things you can do on your own. Experts have done a lot of research about which lifestyle changes and preventive measures are most likely to keep you healthy. Ask your health care provider for more information. Weight and diet Eat a healthy diet  Be sure to include plenty of vegetables, fruits, low-fat dairy products, and lean protein.  Do not eat a lot of foods high in solid fats, added sugars, or salt.  Get regular exercise. This is one of the most important things you can do for your health. ? Most adults should exercise for at least 150 minutes each week. The exercise should increase your  heart rate and make you sweat (moderate-intensity  exercise). ? Most adults should also do strengthening exercises at least twice a week. This is in addition to the moderate-intensity exercise. Maintain a healthy weight  Body mass index (BMI) is a measurement that can be used to identify possible weight problems. It estimates body fat based on height and weight. Your health care provider can help determine your BMI and help you achieve or maintain a healthy weight.  For females 73 years of age and older: ? A BMI below 18.5 is considered underweight. ? A BMI of 18.5 to 24.9 is normal. ? A BMI of 25 to 29.9 is considered overweight. ? A BMI of 30 and above is considered obese. Watch levels of cholesterol and blood lipids  You should start having your blood tested for lipids and cholesterol at 67 years of age, then have this test every 5 years.  You may need to have your cholesterol levels checked more often if: ? Your lipid or cholesterol levels are high. ? You are older than 67 years of age. ? You are at high risk for heart disease. Cancer screening Lung Cancer  Lung cancer screening is recommended for adults 52-85 years old who are at high risk for lung cancer because of a history of smoking.  A yearly low-dose CT scan of the lungs is recommended for people who: ? Currently smoke. ? Have quit within the past 15 years. ? Have at least a 30-pack-year history of smoking. A pack year is smoking an average of one pack of cigarettes a day for 1 year.  Yearly screening should continue until it has been 15 years since you quit.  Yearly screening should stop if you develop a health problem that would prevent you from having lung cancer treatment. Breast Cancer  Practice breast self-awareness. This means understanding how your breasts normally appear and feel.  It also means doing regular breast self-exams. Let your health care provider know about any changes, no matter how small.  If you  are in your 20s or 30s, you should have a clinical breast exam (CBE) by a health care provider every 1-3 years as part of a regular health exam.  If you are 26 or older, have a CBE every year. Also consider having a breast X-ray (mammogram) every year.  If you have a family history of breast cancer, talk to your health care provider about genetic screening.  If you are at high risk for breast cancer, talk to your health care provider about having an MRI and a mammogram every year.  Breast cancer gene (BRCA) assessment is recommended for women who have family members with BRCA-related cancers. BRCA-related cancers include: ? Breast. ? Ovarian. ? Tubal. ? Peritoneal cancers.  Results of the assessment will determine the need for genetic counseling and BRCA1 and BRCA2 testing. Cervical Cancer Your health care provider may recommend that you be screened regularly for cancer of the pelvic organs (ovaries, uterus, and vagina). This screening involves a pelvic examination, including checking for microscopic changes to the surface of your cervix (Pap test). You may be encouraged to have this screening done every 3 years, beginning at age 11.  For women ages 13-65, health care providers may recommend pelvic exams and Pap testing every 3 years, or they may recommend the Pap and pelvic exam, combined with testing for human papilloma virus (HPV), every 5 years. Some types of HPV increase your risk of cervical cancer. Testing for HPV may also be done on women of any age with  unclear Pap test results.  Other health care providers may not recommend any screening for nonpregnant women who are considered low risk for pelvic cancer and who do not have symptoms. Ask your health care provider if a screening pelvic exam is right for you.  If you have had past treatment for cervical cancer or a condition that could lead to cancer, you need Pap tests and screening for cancer for at least 20 years after your  treatment. If Pap tests have been discontinued, your risk factors (such as having a new sexual partner) need to be reassessed to determine if screening should resume. Some women have medical problems that increase the chance of getting cervical cancer. In these cases, your health care provider may recommend more frequent screening and Pap tests. Colorectal Cancer  This type of cancer can be detected and often prevented.  Routine colorectal cancer screening usually begins at 67 years of age and continues through 67 years of age.  Your health care provider may recommend screening at an earlier age if you have risk factors for colon cancer.  Your health care provider may also recommend using home test kits to check for hidden blood in the stool.  A small camera at the end of a tube can be used to examine your colon directly (sigmoidoscopy or colonoscopy). This is done to check for the earliest forms of colorectal cancer.  Routine screening usually begins at age 72.  Direct examination of the colon should be repeated every 5-10 years through 67 years of age. However, you may need to be screened more often if early forms of precancerous polyps or small growths are found. Skin Cancer  Check your skin from head to toe regularly.  Tell your health care provider about any new moles or changes in moles, especially if there is a change in a mole's shape or color.  Also tell your health care provider if you have a mole that is larger than the size of a pencil eraser.  Always use sunscreen. Apply sunscreen liberally and repeatedly throughout the day.  Protect yourself by wearing long sleeves, pants, a wide-brimmed hat, and sunglasses whenever you are outside. Heart disease, diabetes, and high blood pressure  High blood pressure causes heart disease and increases the risk of stroke. High blood pressure is more likely to develop in: ? People who have blood pressure in the high end of the normal range  (130-139/85-89 mm Hg). ? People who are overweight or obese. ? People who are African American.  If you are 5-81 years of age, have your blood pressure checked every 3-5 years. If you are 49 years of age or older, have your blood pressure checked every year. You should have your blood pressure measured twice-once when you are at a hospital or clinic, and once when you are not at a hospital or clinic. Record the average of the two measurements. To check your blood pressure when you are not at a hospital or clinic, you can use: ? An automated blood pressure machine at a pharmacy. ? A home blood pressure monitor.  If you are between 48 years and 56 years old, ask your health care provider if you should take aspirin to prevent strokes.  Have regular diabetes screenings. This involves taking a blood sample to check your fasting blood sugar level. ? If you are at a normal weight and have a low risk for diabetes, have this test once every three years after 67 years of age. ?  If you are overweight and have a high risk for diabetes, consider being tested at a younger age or more often. Preventing infection Hepatitis B  If you have a higher risk for hepatitis B, you should be screened for this virus. You are considered at high risk for hepatitis B if: ? You were born in a country where hepatitis B is common. Ask your health care provider which countries are considered high risk. ? Your parents were born in a high-risk country, and you have not been immunized against hepatitis B (hepatitis B vaccine). ? You have HIV or AIDS. ? You use needles to inject street drugs. ? You live with someone who has hepatitis B. ? You have had sex with someone who has hepatitis B. ? You get hemodialysis treatment. ? You take certain medicines for conditions, including cancer, organ transplantation, and autoimmune conditions. Hepatitis C  Blood testing is recommended for: ? Everyone born from [birth year] through  1963. ? Anyone with known risk factors for hepatitis C. Sexually transmitted infections (STIs)  You should be screened for sexually transmitted infections (STIs) including gonorrhea and chlamydia if: ? You are sexually active and are younger than 67 years of age. ? You are older than 67 years of age and your health care provider tells you that you are at risk for this type of infection. ? Your sexual activity has changed since you were last screened and you are at an increased risk for chlamydia or gonorrhea. Ask your health care provider if you are at risk.  If you do not have HIV, but are at risk, it may be recommended that you take a prescription medicine daily to prevent HIV infection. This is called pre-exposure prophylaxis (PrEP). You are considered at risk if: ? You are sexually active and do not regularly use condoms or know the HIV status of your partner(s). ? You take drugs by injection. ? You are sexually active with a partner who has HIV. Talk with your health care provider about whether you are at high risk of being infected with HIV. If you choose to begin PrEP, you should first be tested for HIV. You should then be tested every 3 months for as long as you are taking PrEP. Pregnancy  If you are premenopausal and you may become pregnant, ask your health care provider about preconception counseling.  If you may become pregnant, take 400 to 800 micrograms (mcg) of folic acid every day.  If you want to prevent pregnancy, talk to your health care provider about birth control (contraception). Osteoporosis and menopause  Osteoporosis is a disease in which the bones lose minerals and strength with aging. This can result in serious bone fractures. Your risk for osteoporosis can be identified using a bone density scan.  If you are 63 years of age or older, or if you are at risk for osteoporosis and fractures, ask your health care provider if you should be screened.  Ask your health  care provider whether you should take a calcium or vitamin D supplement to lower your risk for osteoporosis.  Menopause may have certain physical symptoms and risks.  Hormone replacement therapy may reduce some of these symptoms and risks. Talk to your health care provider about whether hormone replacement therapy is right for you. Follow these instructions at home:  Schedule regular health, dental, and eye exams.  Stay current with your immunizations.  Do not use any tobacco products including cigarettes, chewing tobacco, or electronic cigarettes.  If you  are pregnant, do not drink alcohol.  If you are breastfeeding, limit how much and how often you drink alcohol.  Limit alcohol intake to no more than 1 drink per day for nonpregnant women. One drink equals 12 ounces of beer, 5 ounces of wine, or 1 ounces of hard liquor.  Do not use street drugs.  Do not share needles.  Ask your health care provider for help if you need support or information about quitting drugs.  Tell your health care provider if you often feel depressed.  Tell your health care provider if you have ever been abused or do not feel safe at home. This information is not intended to replace advice given to you by your health care provider. Make sure you discuss any questions you have with your health care provider. Document Released: 07/02/2011 Document Revised: 05/24/2016 Document Reviewed: 09/20/2015 Elsevier Interactive Patient Education  2019 [employer].

## 2019-01-07 NOTE — Progress Notes (Signed)
Subjective:  Patient ID: Meredith Hayden, female    DOB: 05-02-1950  Age: 69 y.o. MRN: [id number]  CC: Annual Exam; Abdominal Pain; Diabetes; and Hyperlipidemia   HPI Meredith Hayden presents for a CPX.  She complains of a chronic, intermittent sensation in her left upper quadrant that she describes as a grabbing sensation.  She has a history of IBS with diarrhea but more recently has been experiencing constipation.  The symptoms are not significant enough for her to want to have them treated.  She denies nausea, vomiting, loss of appetite, weight loss, bloody stool, dysuria, or hematuria.  She is not taking Synjardy because the SGLT2 component caused recurrent yeast infections.  She tells me her blood sugars recently have been well controlled in the 120-1 30 range.  She tells me she sees her endocrinologist in 3 weeks.  Past Medical History:  Diagnosis Date  . Allergy   . Anxiety   . Arthritis   . Asthma   . Blood transfusion without reported diagnosis 2002   with knee replacement  . Depression   . Diabetes mellitus   . Diabetes mellitus type 2 with retinopathy ([place])   . Diverticulitis   . Dyspareunia    vaginal dryness  . Fibroid    reason for hysterectomy  . GERD (gastroesophageal reflux disease)   . 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 Heart disease   . Hyperlipidemia   . Hypertension   . IBS (irritable bowel syndrome)   . Lichen sclerosus   . OSA (obstructive sleep apnea)   . Osteopenia 2009  . Psoriasis   . Sleep apnea   . STD (sexually transmitted disease)    Hx HPV  . Thyroid disease    Past Surgical History:  Procedure Laterality Date  . ABDOMINAL HYSTERECTOMY  1995   TAH/LSO--Dr. Ubaldo Glassing  . [place]  . FOOT SURGERY Bilateral   . NASAL SINUS SURGERY    . OOPHORECTOMY    . PARTIAL COLECTOMY     -RSO  . ROTATOR CUFF REPAIR Left   . TOTAL KNEE ARTHROPLASTY Bilateral 2002, 2004     reports that she has never smoked. She has never used smokeless tobacco. She reports that she does not drink alcohol or use drugs. family history includes COPD in her mother; Cancer in an other family member; Diabetes in her brother, maternal aunt, mother, sister, and sister; Heart attack (age of onset: 8) in her sister; Heart attack (age of onset: 52) in her father; Hyperlipidemia in an other family member; Hypertension in her father, sister, sister, and another family member; Liver cancer in her maternal grandmother; Ovarian cancer in her maternal aunt; Stomach cancer in her maternal grandmother. Allergies  Allergen Reactions  . Jardiance [Empagliflozin] Other (See Comments)    Vaginal yeast infections  . Oxycodone-Acetaminophen Anaphylaxis    Patient states that she can regular tylenol without any problems  . Trulicity [Dulaglutide] Other (See Comments)    Upper GI pain  . Codeine Phosphate     REACTION: unspecified  . Erythromycin Other (See Comments)    Dizzy and sweaty   . Metformin And Related Diarrhea  . Morphine Sulfate Itching    Tolerated hydromorphone   . Oxycodone Hcl     REACTION: unspecified  . Sulfamethoxazole     REACTION: rash  . Tylenol [Acetaminophen] Other (See Comments)    Fatty liver    Outpatient Medications  Prior to Visit  Medication Sig Dispense Refill  . ALPRAZolam (XANAX) 1 MG tablet Take 1 mg by mouth 3 (three) times daily as needed for anxiety.    . Ascorbic Acid (VITAMIN C) 1000 MG tablet Take 1,000 mg by mouth daily.    Marland Kitchen aspirin 81 MG tablet Take 81 mg by mouth daily.     . ATROVENT HFA 17 MCG/ACT inhaler USE 2 PUFFS EVERY 4 HOURS  AS NEEDED FOR WHEEZING. 12.9 g 0  . BD PEN NEEDLE NANO U/F 32G X 4 MM MISC USE WITH INSULINE FOR DOSING 4 TIMES DAILY-AFTER MEALS AND AT BEDTIME. 100 each 3  . Blood Glucose Monitoring Suppl (ONETOUCH VERIO) W/DEVICE KIT 1 each by Does not apply route daily. 1 kit 0  . cetirizine (ZYRTEC) 10 MG tablet Take 10 mg by  mouth daily.     . Cholecalciferol (VITAMIN D3) 1000 UNITS CAPS Take 1 capsule by mouth daily.      . Continuous Blood Gluc Receiver (FREESTYLE LIBRE 14 DAY READER) DEVI 1 Device by Does not apply route every 14 (fourteen) days. 6 Device 3  . Continuous Blood Gluc Sensor (FREESTYLE LIBRE 14 DAY SENSOR) MISC 1 Device by Does not apply route every 14 (fourteen) days. 6 each 3  . cyanocobalamin 100 MCG tablet Take 100 mcg by mouth daily.      Marland Kitchen desvenlafaxine (PRISTIQ) 100 MG 24 hr tablet Take 50 mg by mouth daily.     Marland Kitchen gabapentin (NEURONTIN) 100 MG capsule Take 600 mg by mouth 2 (two) times daily.     Marland Kitchen glucose blood (ONETOUCH VERIO) test strip CHECK BLOOD SUGAR QID. 200 each 11  . insulin lispro (HUMALOG KWIKPEN) 100 UNIT/ML KiwkPen Inject 0.1 mLs (10 Units total) into the skin 3 (three) times daily. 15 mL 11  . Insulin Pen Needle (INSUPEN PEN NEEDLES) 33G X 4 MM MISC Inject 1 Act into the skin 4 (four) times daily - after meals and at bedtime. 200 each 11  . levothyroxine (SYNTHROID, LEVOTHROID) 50 MCG tablet TAKE 1 TABLET BY MOUTH  DAILY 90 tablet 0  . metoprolol tartrate (LOPRESSOR) 25 MG tablet TAKE 1 TABLET BY MOUTH TWO  TIMES DAILY 180 tablet 0  . Multiple Vitamins-Minerals (CENTRUM SILVER PO) Take 1 tablet by mouth daily.    . Omega-3 350 MG CAPS Take 350 mg by mouth daily as needed.     Marland Kitchen omeprazole (PRILOSEC) 20 MG capsule Take 20 mg by mouth daily.     [name] DELICA LANCETS 83A MISC Inject 1 Act into the skin 4 (four) times daily - after meals and at bedtime. 200 each 11  . rosuvastatin (CRESTOR) 20 MG tablet TAKE 1 TABLET EACH DAY. 90 tablet 1  . TOUJEO SOLOSTAR 300 UNIT/ML SOPN INJECT 60 UNITS INTO THE SKIN DAILY. 4.5 mL 0  . vortioxetine HBr (TRINTELLIX) 20 MG TABS tablet Take 20 mg by mouth daily.    Marland Kitchen zolpidem (AMBIEN) 10 MG tablet Take 10 mg by mouth at bedtime as needed for sleep.    Marland Kitchen lisinopril-hydrochlorothiazide (PRINZIDE,ZESTORETIC) 20-12.5 MG tablet Take 1 tablet by  mouth daily. 90 tablet 1  . nystatin (MYCOSTATIN/NYSTOP) powder Apply topically 3 (three) times daily. Apply to affected area for up to 7 days 30 g 1  . Empagliflozin-linaGLIPtin (GLYXAMBI) 25-5 MG TABS Take 1 tablet by mouth daily. (Patient not taking: Reported on 01/07/2019) 90 tablet 1  . lamoTRIgine (LAMICTAL) 200 MG tablet Take 200 mg by mouth daily.  No facility-administered medications prior to visit.     ROS Review of Systems  Constitutional: Negative for appetite change, diaphoresis, fatigue and unexpected weight change.  HENT: Negative for trouble swallowing and voice change.   Eyes: Negative for visual disturbance.  Respiratory: Negative for cough, chest tightness, shortness of breath and wheezing.   Cardiovascular: Negative for chest pain, palpitations and leg swelling.  Gastrointestinal: Positive for abdominal pain and constipation. Negative for abdominal distention, diarrhea, nausea and vomiting.  Endocrine: Negative for polydipsia, polyphagia and polyuria.  Genitourinary: Negative.  Negative for difficulty urinating, dysuria, hematuria and urgency.  Musculoskeletal: Negative.  Negative for arthralgias and myalgias.  Skin: Negative.  Negative for color change, pallor and rash.  Neurological: Negative.  Negative for dizziness, weakness, light-headedness and headaches.  Hematological: Negative for adenopathy. Does not bruise/bleed easily.  Psychiatric/Behavioral: Negative.  Negative for sleep disturbance. The patient is not nervous/anxious.     Objective:  BP 136/76 (BP Location: Left Arm, Patient Position: Sitting, Cuff Size: Large)   Pulse 66   Temp 98.6 F (37 C) (Oral)   Ht '5\' 1"'  (1.549 m)   Wt 242 lb (109.8 kg)   LMP 12/31/1993   SpO2 96%   BMI 45.73 kg/m   BP Readings from Last 3 Encounters:  01/07/19 136/76  01/07/19 136/76  09/08/18 (!) 146/78    Wt Readings from Last 3 Encounters:  01/07/19 242 lb (109.8 kg)  01/07/19 242 lb (109.8 kg)  09/08/18  249 lb (112.9 kg)    Physical Exam Vitals signs reviewed.  Constitutional:      Appearance: She is obese.  HENT:     Mouth/Throat:     Mouth: Mucous membranes are moist.     Pharynx: No oropharyngeal exudate.  Eyes:     General: No scleral icterus. Cardiovascular:     Rate and Rhythm: Normal rate and regular rhythm.     Heart sounds: No murmur. No friction rub. No gallop.   Pulmonary:     Effort: Pulmonary effort is normal.     Breath sounds: No stridor. No wheezing, rhonchi or rales.  Abdominal:     General: Abdomen is flat. Bowel sounds are normal.     Palpations: Abdomen is soft. There is no hepatomegaly or splenomegaly.     Tenderness: There is no abdominal tenderness.     Hernia: No hernia is present. There is no hernia in the ventral area.  Genitourinary:    Comments: GU and rectal exams were deferred at her request.  He tells me she just had this done 4 months ago by gynecologist. Skin:    General: Skin is warm and dry.     Coloration: Skin is not jaundiced or pale.     Findings: No rash.  Neurological:     Mental Status: She is alert.     Lab Results  Component Value Date   WBC 8.1 01/07/2019   HGB 13.2 01/07/2019   HCT 40.9 01/07/2019   PLT 224.0 01/07/2019   GLUCOSE 219 (H) 01/07/2019   CHOL 134 01/07/2019   TRIG 203.0 (H) 01/07/2019   HDL 41.10 01/07/2019   LDLDIRECT 69.0 01/07/2019   LDLCALC 79 03/06/2016   ALT 12 01/07/2019   AST 20 01/07/2019   NA 138 01/07/2019   K 4.2 01/07/2019   CL 100 01/07/2019   CREATININE 1.36 (H) 01/07/2019   BUN 20 01/07/2019   CO2 29 01/07/2019   TSH 1.56 01/07/2019   INR 1.05 01/26/2015   HGBA1C  10.0 (H) 01/07/2019   MICROALBUR 1.3 01/07/2019      Assessment & Plan:   Kaula was seen today for annual exam, abdominal pain, diabetes and hyperlipidemia.  Diagnoses and all orders for this visit:  Acquired hypothyroidism- Her TSH is in the normal range.  She will remain on the current dose of levothyroxine. -      TSH; Future  Chronic renal disease, stage 3, moderately decreased glomerular filtration rate (GFR) between 30-59 mL/min/1.73 square meter (HCC)-her creatinine clearance is stable at 46.6. -     CBC with Differential/Platelet; Future -     Comprehensive metabolic panel; Future -     Urinalysis, Routine w reflex microscopic; Future  Essential hypertension- Her blood pressure is adequately well controlled.  Electrolytes are normal.  Renal function is stable. -     CBC with Differential/Platelet; Future -     Discontinue: lisinopril-hydrochlorothiazide (PRINZIDE,ZESTORETIC) 20-12.5 MG tablet; Take 1 tablet by mouth daily. -     lisinopril-hydrochlorothiazide (PRINZIDE,ZESTORETIC) 20-12.5 MG tablet; Take 1 tablet by mouth daily.  Hyperlipidemia LDL goal <130- She has achieved her LDL goal and is doing well on the statin. -     Lipid panel; Future  Diabetes mellitus with renal manifestations, uncontrolled ([place])- Her blood sugars are not well controlled.  She will see an endocrinologist soon about this. -     Microalbumin / creatinine urine ratio; Future -     Hemoglobin A1c; Future -     Discontinue: lisinopril-hydrochlorothiazide (PRINZIDE,ZESTORETIC) 20-12.5 MG tablet; Take 1 tablet by mouth daily. -     lisinopril-hydrochlorothiazide (PRINZIDE,ZESTORETIC) 20-12.5 MG tablet; Take 1 tablet by mouth daily.  OBESITY, MORBID- She is working on her lifestyle modifications to lose weight.  Routine general medical examination at a health care facility  Irritable bowel syndrome with constipation- She does not want to treat this.  Pyuria- I have ordered a culture to see if she has a UTI.  She will let me know if she has any suspicious symptoms. -     CULTURE, URINE COMPREHENSIVE; Future   I have discontinued Aldine Contes "Chris"'s nystatin. I am also having her maintain her aspirin, Omega-3, desvenlafaxine, cetirizine, cyanocobalamin, Vitamin D3, vitamin C, ONETOUCH VERIO, omeprazole, zolpidem,  ATROVENT HFA, insulin lispro, glucose blood, ONETOUCH DELICA LANCETS 55V, Insulin Pen Needle, ALPRAZolam, gabapentin, rosuvastatin, FREESTYLE LIBRE 14 DAY READER, FREESTYLE LIBRE 14 DAY SENSOR, metoprolol tartrate, TOUJEO SOLOSTAR, BD PEN NEEDLE NANO U/F, levothyroxine, Multiple Vitamins-Minerals (CENTRUM SILVER PO), vortioxetine HBr, and lisinopril-hydrochlorothiazide.  Meds ordered this encounter  Medications  . DISCONTD: lisinopril-hydrochlorothiazide (PRINZIDE,ZESTORETIC) 20-12.5 MG tablet    Sig: Take 1 tablet by mouth daily.    Dispense:  90 tablet    Refill:  1  . lisinopril-hydrochlorothiazide (PRINZIDE,ZESTORETIC) 20-12.5 MG tablet    Sig: Take 1 tablet by mouth daily.    Dispense:  90 tablet    Refill:  1   See AVS for instructions about healthy living and anticipatory guidance.   Follow-up: Return in about 6 months (around 07/08/2019).  Scarlette Calico, MD

## 2019-01-08 ENCOUNTER — Encounter: Payer: Self-pay | Admitting: Internal Medicine

## 2019-01-08 ENCOUNTER — Other Ambulatory Visit: Payer: Self-pay | Admitting: Internal Medicine

## 2019-01-08 DIAGNOSIS — I10 Essential (primary) hypertension: Secondary | ICD-10-CM

## 2019-01-08 DIAGNOSIS — R8281 Pyuria: Secondary | ICD-10-CM | POA: Insufficient documentation

## 2019-01-08 DIAGNOSIS — E1129 Type 2 diabetes mellitus with other diabetic kidney complication: Secondary | ICD-10-CM

## 2019-01-08 DIAGNOSIS — IMO0002 Reserved for concepts with insufficient information to code with codable children: Secondary | ICD-10-CM

## 2019-01-08 DIAGNOSIS — E1165 Type 2 diabetes mellitus with hyperglycemia: Secondary | ICD-10-CM

## 2019-01-09 MED ORDER — LISINOPRIL-HYDROCHLOROTHIAZIDE 20-12.5 MG PO TABS: 1.0000 | ORAL_TABLET | Freq: Every day | ORAL | 1 refills | Status: DC

## 2019-01-12 NOTE — Assessment & Plan Note (Signed)

## 2019-01-17 ENCOUNTER — Other Ambulatory Visit: Payer: Self-pay | Admitting: Internal Medicine

## 2019-01-17 DIAGNOSIS — E11319 Type 2 diabetes mellitus with unspecified diabetic retinopathy without macular edema: Secondary | ICD-10-CM

## 2019-02-07 ENCOUNTER — Other Ambulatory Visit: Payer: Self-pay | Admitting: Internal Medicine

## 2019-02-10 ENCOUNTER — Other Ambulatory Visit: Payer: Self-pay | Admitting: Internal Medicine

## 2019-02-10 DIAGNOSIS — I10 Essential (primary) hypertension: Secondary | ICD-10-CM

## 2019-02-10 DIAGNOSIS — E039 Hypothyroidism, unspecified: Secondary | ICD-10-CM

## 2019-02-17 ENCOUNTER — Other Ambulatory Visit: Payer: Self-pay | Admitting: Internal Medicine

## 2019-02-17 DIAGNOSIS — I129 Hypertensive chronic kidney disease with stage 1 through stage 4 chronic kidney disease, or unspecified chronic kidney disease: Secondary | ICD-10-CM

## 2019-02-17 DIAGNOSIS — Z794 Long term (current) use of insulin: Secondary | ICD-10-CM | POA: Diagnosis not present

## 2019-02-17 DIAGNOSIS — N183 Chronic kidney disease, stage 3 unspecified: Secondary | ICD-10-CM

## 2019-02-17 DIAGNOSIS — E1165 Type 2 diabetes mellitus with hyperglycemia: Secondary | ICD-10-CM | POA: Diagnosis not present

## 2019-02-17 DIAGNOSIS — Q613 Polycystic kidney, unspecified: Secondary | ICD-10-CM

## 2019-02-23 ENCOUNTER — Other Ambulatory Visit: Payer: Medicare Other

## 2019-02-26 ENCOUNTER — Ambulatory Visit
Admission: RE | Admit: 2019-02-26 | Discharge: 2019-02-26 | Disposition: A | Discharge status: Home / Self Care | Payer: Medicare Other | Source: Ambulatory Visit | Attending: Internal Medicine | Admitting: Internal Medicine

## 2019-02-26 DIAGNOSIS — Q613 Polycystic kidney, unspecified: Secondary | ICD-10-CM

## 2019-02-26 DIAGNOSIS — N183 Chronic kidney disease, stage 3 unspecified: Secondary | ICD-10-CM

## 2019-02-26 DIAGNOSIS — I129 Hypertensive chronic kidney disease with stage 1 through stage 4 chronic kidney disease, or unspecified chronic kidney disease: Secondary | ICD-10-CM

## 2019-03-16 ENCOUNTER — Other Ambulatory Visit: Payer: Self-pay | Admitting: Internal Medicine

## 2019-03-16 DIAGNOSIS — E785 Hyperlipidemia, unspecified: Secondary | ICD-10-CM

## 2019-03-16 DIAGNOSIS — E11319 Type 2 diabetes mellitus with unspecified diabetic retinopathy without macular edema: Secondary | ICD-10-CM

## 2019-03-26 ENCOUNTER — Encounter: Payer: Self-pay | Admitting: Internal Medicine

## 2019-03-26 ENCOUNTER — Other Ambulatory Visit: Payer: Self-pay | Admitting: Internal Medicine

## 2019-03-26 DIAGNOSIS — J410 Simple chronic bronchitis: Secondary | ICD-10-CM

## 2019-03-26 MED ORDER — ALBUTEROL SULFATE HFA 108 (90 BASE) MCG/ACT IN AERS: 2.0000 | INHALATION_SPRAY | Freq: Four times a day (QID) | RESPIRATORY_TRACT | 5 refills | Status: DC | PRN

## 2019-03-26 MED ORDER — IPRATROPIUM BROMIDE HFA 17 MCG/ACT IN AERS: INHALATION_SPRAY | RESPIRATORY_TRACT | 3 refills | Status: DC

## 2019-03-30 ENCOUNTER — Telehealth: Payer: Medicare Other | Admitting: Family

## 2019-03-30 DIAGNOSIS — R05 Cough: Secondary | ICD-10-CM

## 2019-03-30 DIAGNOSIS — R059 Cough, unspecified: Secondary | ICD-10-CM

## 2019-03-30 MED ORDER — BENZONATATE 100 MG PO CAPS: 100.0000 mg | ORAL_CAPSULE | Freq: Two times a day (BID) | ORAL | 0 refills | Status: DC | PRN

## 2019-03-30 NOTE — Progress Notes (Signed)

## 2019-05-02 ENCOUNTER — Other Ambulatory Visit: Payer: Self-pay | Admitting: Internal Medicine

## 2019-05-02 DIAGNOSIS — E11319 Type 2 diabetes mellitus with unspecified diabetic retinopathy without macular edema: Secondary | ICD-10-CM

## 2019-05-24 ENCOUNTER — Other Ambulatory Visit: Payer: Self-pay | Admitting: Internal Medicine

## 2019-06-02 DIAGNOSIS — E1165 Type 2 diabetes mellitus with hyperglycemia: Secondary | ICD-10-CM | POA: Diagnosis not present

## 2019-06-02 DIAGNOSIS — K760 Fatty (change of) liver, not elsewhere classified: Secondary | ICD-10-CM | POA: Diagnosis not present

## 2019-06-02 DIAGNOSIS — Z794 Long term (current) use of insulin: Secondary | ICD-10-CM | POA: Diagnosis not present

## 2019-06-02 DIAGNOSIS — N183 Chronic kidney disease, stage 3 (moderate): Secondary | ICD-10-CM | POA: Diagnosis not present

## 2019-06-06 ENCOUNTER — Other Ambulatory Visit: Payer: Self-pay | Admitting: Internal Medicine

## 2019-06-06 DIAGNOSIS — E11319 Type 2 diabetes mellitus with unspecified diabetic retinopathy without macular edema: Secondary | ICD-10-CM

## 2019-07-09 ENCOUNTER — Other Ambulatory Visit: Payer: Self-pay | Admitting: Internal Medicine

## 2019-07-09 DIAGNOSIS — E11319 Type 2 diabetes mellitus with unspecified diabetic retinopathy without macular edema: Secondary | ICD-10-CM

## 2019-07-17 ENCOUNTER — Other Ambulatory Visit: Payer: Self-pay | Admitting: Internal Medicine

## 2019-07-17 DIAGNOSIS — I10 Essential (primary) hypertension: Secondary | ICD-10-CM

## 2019-07-17 DIAGNOSIS — IMO0002 Reserved for concepts with insufficient information to code with codable children: Secondary | ICD-10-CM

## 2019-07-17 DIAGNOSIS — E1129 Type 2 diabetes mellitus with other diabetic kidney complication: Secondary | ICD-10-CM

## 2019-07-20 ENCOUNTER — Encounter: Payer: Self-pay | Admitting: Internal Medicine

## 2019-07-28 ENCOUNTER — Other Ambulatory Visit: Payer: Self-pay | Admitting: Internal Medicine

## 2019-07-28 ENCOUNTER — Other Ambulatory Visit (INDEPENDENT_AMBULATORY_CARE_PROVIDER_SITE_OTHER): Payer: Medicare Other

## 2019-07-28 ENCOUNTER — Encounter: Payer: Self-pay | Admitting: Internal Medicine

## 2019-07-28 ENCOUNTER — Other Ambulatory Visit: Payer: Self-pay

## 2019-07-28 ENCOUNTER — Ambulatory Visit (INDEPENDENT_AMBULATORY_CARE_PROVIDER_SITE_OTHER): Payer: Medicare Other | Admitting: Internal Medicine

## 2019-07-28 VITALS — BP 120/78 | HR 64 | Temp 98.6°F | Resp 16 | Ht 61.0 in | Wt 234.0 lb

## 2019-07-28 DIAGNOSIS — E781 Pure hyperglyceridemia: Secondary | ICD-10-CM

## 2019-07-28 DIAGNOSIS — E11319 Type 2 diabetes mellitus with unspecified diabetic retinopathy without macular edema: Secondary | ICD-10-CM

## 2019-07-28 DIAGNOSIS — E1129 Type 2 diabetes mellitus with other diabetic kidney complication: Secondary | ICD-10-CM

## 2019-07-28 DIAGNOSIS — N183 Chronic kidney disease, stage 3 unspecified: Secondary | ICD-10-CM

## 2019-07-28 DIAGNOSIS — IMO0002 Reserved for concepts with insufficient information to code with codable children: Secondary | ICD-10-CM

## 2019-07-28 DIAGNOSIS — E1165 Type 2 diabetes mellitus with hyperglycemia: Secondary | ICD-10-CM

## 2019-07-28 DIAGNOSIS — E039 Hypothyroidism, unspecified: Secondary | ICD-10-CM

## 2019-07-28 DIAGNOSIS — I10 Essential (primary) hypertension: Secondary | ICD-10-CM | POA: Diagnosis not present

## 2019-07-28 LAB — BASIC METABOLIC PANEL
BUN: 22 mg/dL (ref 6–23)
CO2: 31 mEq/L (ref 19–32)
Calcium: 9.1 mg/dL (ref 8.4–10.5)
Chloride: 100 mEq/L (ref 96–112)
Creatinine, Ser: 1.46 mg/dL — ABNORMAL HIGH (ref 0.40–1.20)
GFR: 35.75 mL/min — ABNORMAL LOW (ref >60.00)
Glucose, Bld: 157 mg/dL — ABNORMAL HIGH (ref 70–99)
Potassium: 4.3 mEq/L (ref 3.5–5.1)
Sodium: 137 mEq/L (ref 135–145)

## 2019-07-28 LAB — POCT GLYCOSYLATED HEMOGLOBIN (HGB A1C): Hemoglobin A1C: 7.1 % — AB (ref 4.0–5.6)

## 2019-07-28 LAB — TRIGLYCERIDES: Triglycerides: 242 mg/dL — ABNORMAL HIGH (ref 0.0–149.0)

## 2019-07-28 LAB — TSH: TSH: 1.71 u[IU]/mL (ref 0.35–4.50)

## 2019-07-28 MED ORDER — VASCEPA 1 G PO CAPS: 2.0000 | ORAL_CAPSULE | Freq: Two times a day (BID) | ORAL | 1 refills | Status: DC

## 2019-07-28 MED ORDER — CONTOUR NEXT ONE KIT: 1.0000 | PACK | Freq: Two times a day (BID) | 0 refills | Status: DC

## 2019-07-28 MED ORDER — LEVOTHYROXINE SODIUM 50 MCG PO TABS: 50.0000 ug | ORAL_TABLET | Freq: Every day | ORAL | 1 refills | Status: DC

## 2019-07-28 NOTE — Progress Notes (Signed)
Subjective:  Patient ID: Meredith Hayden, female    DOB: [birth year]  Age: 69 y.o. MRN: [id number]  CC: Hypertension, Diabetes, and Hypothyroidism   HPI Marisella Puccio presents for f/up - She has been diligent with her lifestyle modifications over the last 6 months and has been able to lose weight.  She is also compliant with the basal/bolus insulin.  She lost her blood sugar machine over 2 weeks ago so she has not been checking her blood sugar recently.  She denies polys.  Outpatient Medications Prior to Visit  Medication Sig Dispense Refill  . albuterol (PROAIR HFA) 108 (90 Base) MCG/ACT inhaler Inhale 2 puffs into the lungs every 6 (six) hours as needed. 18 g 5  . ALPRAZolam (XANAX) 1 MG tablet Take 1 mg by mouth 3 (three) times daily as needed for anxiety.    . Ascorbic Acid (VITAMIN C) 1000 MG tablet Take 1,000 mg by mouth daily.    Marland Kitchen aspirin 81 MG tablet Take 81 mg by mouth daily.     . BD PEN NEEDLE NANO U/F 32G X 4 MM MISC USE WITH INSULIN FOR DOSING 4 TIMES DAILY-AFTER MEALS AND AT BEDTIME. 100 each 0  . Blood Glucose Monitoring Suppl (ONETOUCH VERIO) W/DEVICE KIT 1 each by Does not apply route daily. 1 kit 0  . cetirizine (ZYRTEC) 10 MG tablet Take 10 mg by mouth daily.     . Cholecalciferol (VITAMIN D3) 1000 UNITS CAPS Take 1 capsule by mouth daily.      . cyanocobalamin 100 MCG tablet Take 100 mcg by mouth daily.      Marland Kitchen gabapentin (NEURONTIN) 100 MG capsule Take 600 mg by mouth 2 (two) times daily.     Marland Kitchen glucose blood (ONETOUCH VERIO) test strip CHECK BLOOD SUGAR QID. 200 each 11  . Insulin Pen Needle (INSUPEN PEN NEEDLES) 33G X 4 MM MISC Inject 1 Act into the skin 4 (four) times daily - after meals and at bedtime. 200 each 11  . ipratropium (ATROVENT HFA) 17 MCG/ACT inhaler USE 2 PUFFS EVERY 4 HOURS  AS NEEDED FOR WHEEZING. 12.9 g 3  . lisinopril-hydrochlorothiazide (PRINZIDE,ZESTORETIC) 20-12.5 MG tablet Take 1 tablet by mouth daily. 90 tablet 1  . metoprolol  tartrate (LOPRESSOR) 25 MG tablet TAKE 1 TABLET BY MOUTH TWO  TIMES DAILY 180 tablet 1  . ONETOUCH DELICA LANCETS 76E MISC Inject 1 Act into the skin 4 (four) times daily - after meals and at bedtime. 200 each 11  . rosuvastatin (CRESTOR) 20 MG tablet TAKE 1 TABLET BY MOUTH  DAILY 90 tablet 1  . vortioxetine HBr (TRINTELLIX) 20 MG TABS tablet Take 20 mg by mouth daily.    Marland Kitchen zolpidem (AMBIEN) 10 MG tablet Take 10 mg by mouth at bedtime as needed for sleep.    . benzonatate (TESSALON) 100 MG capsule Take 1 capsule (100 mg total) by mouth 2 (two) times daily as needed for cough. 20 capsule 0  . Continuous Blood Gluc Receiver (FREESTYLE LIBRE 14 DAY READER) DEVI 1 Device by Does not apply route every 14 (fourteen) days. 6 Device 3  . Continuous Blood Gluc Sensor (FREESTYLE LIBRE 14 DAY SENSOR) MISC 1 Device by Does not apply route every 14 (fourteen) days. 6 each 3  . desvenlafaxine (PRISTIQ) 100 MG 24 hr tablet Take 50 mg by mouth daily.     Marland Kitchen HUMALOG KWIKPEN 100 UNIT/ML KwikPen INJECT 10 UNITS INTO THE SKIN 3 TIMES DAILY. 15 mL 1  .  insulin lispro (HUMALOG KWIKPEN) 100 UNIT/ML KiwkPen Inject 0.1 mLs (10 Units total) into the skin 3 (three) times daily. 15 mL 11  . levothyroxine (SYNTHROID, LEVOTHROID) 50 MCG tablet TAKE 1 TABLET BY MOUTH  DAILY 90 tablet 1  . Multiple Vitamins-Minerals (CENTRUM SILVER PO) Take 1 tablet by mouth daily.    Marland Kitchen omeprazole (PRILOSEC) 20 MG capsule Take 20 mg by mouth daily.     Nelva Nay SOLOSTAR 300 UNIT/ML SOPN INJECT 60 UNITS INTO THE SKIN DAILY. 4.5 mL 0  . Omega-3 350 MG CAPS Take 350 mg by mouth daily as needed.      No facility-administered medications prior to visit.     ROS Review of Systems  Constitutional: Negative.  Negative for diaphoresis, fatigue and unexpected weight change.  HENT: Negative.   Eyes: Negative for visual disturbance.  Respiratory: Negative for cough, chest tightness, shortness of breath and wheezing.   Cardiovascular: Negative for  chest pain, palpitations and leg swelling.  Gastrointestinal: Negative for abdominal pain, constipation, diarrhea, nausea and vomiting.  Endocrine: Negative for cold intolerance, heat intolerance, polydipsia, polyphagia and polyuria.  Genitourinary: Negative.  Negative for difficulty urinating and dysuria.  Musculoskeletal: Negative.  Negative for arthralgias and myalgias.  Skin: Negative.  Negative for color change and pallor.  Neurological: Negative.  Negative for dizziness, weakness and light-headedness.  Hematological: Negative for adenopathy. Does not bruise/bleed easily.  Psychiatric/Behavioral: Negative.     Objective:  BP 120/78 (BP Location: Left Arm, Patient Position: Sitting, Cuff Size: Large)   Pulse 64   Temp 98.6 F (37 C) (Oral)   Resp 16   Ht _0  (1.549 m)   Wt 234 lb (106.1 kg)   LMP 12/31/1993   SpO2 94%   BMI 44.21 kg/m   BP Readings from Last 3 Encounters:  07/28/19 120/78  01/07/19 136/76  01/07/19 136/76    Wt Readings from Last 3 Encounters:  07/28/19 234 lb (106.1 kg)  01/07/19 242 lb (109.8 kg)  01/07/19 242 lb (109.8 kg)    Physical Exam Vitals signs reviewed.  Constitutional:      General: She is not in acute distress.    Appearance: She is obese. She is not ill-appearing, toxic-appearing or diaphoretic.  HENT:     Nose: Nose normal.     Mouth/Throat:     Mouth: Mucous membranes are moist.  Eyes:     General: No scleral icterus.    Conjunctiva/sclera: Conjunctivae normal.  Neck:     Musculoskeletal: Normal range of motion. No neck rigidity or muscular tenderness.  Cardiovascular:     Rate and Rhythm: Normal rate and regular rhythm.     Heart sounds: No murmur.  Pulmonary:     Effort: Pulmonary effort is normal.     Breath sounds: No stridor. No wheezing, rhonchi or rales.  Abdominal:     General: Abdomen is protuberant. There is no distension.     Palpations: There is no hepatomegaly, splenomegaly or mass.     Tenderness: There  is no abdominal tenderness.  Musculoskeletal: Normal range of motion.     Right lower leg: No edema.     Left lower leg: No edema.  Lymphadenopathy:     Cervical: No cervical adenopathy.  Skin:    General: Skin is warm and dry.     Coloration: Skin is not pale.  Neurological:     General: No focal deficit present.     Mental Status: She is alert.  Psychiatric:  Mood and Affect: Mood normal.        Behavior: Behavior normal.     Lab Results  Component Value Date   WBC 8.1 01/07/2019   HGB 13.2 01/07/2019   HCT 40.9 01/07/2019   PLT 224.0 01/07/2019   GLUCOSE 157 (H) 07/28/2019   CHOL 134 01/07/2019   TRIG 242.0 (H) 07/28/2019   HDL 41.10 01/07/2019   LDLDIRECT 69.0 01/07/2019   LDLCALC 79 03/06/2016   ALT 12 01/07/2019   AST 20 01/07/2019   NA 137 07/28/2019   K 4.3 07/28/2019   CL 100 07/28/2019   CREATININE 1.46 (H) 07/28/2019   BUN 22 07/28/2019   CO2 31 07/28/2019   TSH 1.71 07/28/2019   INR 1.05 01/26/2015   HGBA1C 7.1 (A) 07/28/2019   MICROALBUR 1.3 01/07/2019    US Renal Artery Duplex Complete  Result Date: 02/26/2019 CLINICAL DATA:  Chronic kidney disease stage 3 EXAM: RENAL/URINARY TRACT ULTRASOUND RENAL DUPLEX DOPPLER ULTRASOUND COMPARISON:  05/19/2004 CT with contrast FINDINGS: Right Kidney: Length: 9.7 cm. Echogenicity within normal limits. No mass or hydronephrosis visualized. Left Kidney: Length: 10.4 cm. Echogenicity within normal limits. No mass or hydronephrosis visualized. Bladder:  Not fully distended.  No gross abnormality by ultrasound. RENAL DUPLEX ULTRASOUND Right Renal Artery Velocities: Origin:  57 cm/sec Mid:  154 cm/sec Hilum:  76 cm/sec Interlobar:  54 cm/sec Arcuate:  24 cm/sec Left Renal Artery Velocities: Origin:  113 cm/sec Mid:  108 cm/sec Hilum:  101 cm/sec Interlobar:  46 cm/sec Arcuate:  20 cm/sec Aortic Velocity:  101 cm/sec Right Renal-Aortic Ratios: Origin: 0.6 Mid:  1.5 Hilum: 0.8 Interlobar: 0.5 Arcuate: 0.2 Left Renal-Aortic  Ratios: Origin: 1.1 Mid: 1.1 Hilum: 1.0 Interlobar: 0.5 Arcuate: 0.2 No significant renal artery stenosis by renal duplex. Renal veins appear patent. Bladder under distended but grossly normal. Exam is limited because of obesity. IMPRESSION: No significant renal artery stenosis by ultrasound criteria. No other acute renal abnormality by ultrasound. Electronically Signed   By: Jerilynn Mages.  Shick M.D.   On: 02/26/2019 12:01    Assessment & Plan:   Zenaida was seen today for hypertension, diabetes and hypothyroidism.  Diagnoses and all orders for this visit:  Essential hypertension- Her blood pressure is adequately well controlled. -     Basic metabolic panel; Future  Diabetes mellitus with renal manifestations, uncontrolled ([place])- Her A1c is at 7.1%.  She has achieved good glycemic control.  Will continue the current regimen. -     Basic metabolic panel; Future -     Blood Glucose Monitoring Suppl (CONTOUR NEXT ONE) KIT; 1 Act by Does not apply route 2 (two) times a day. -     Ambulatory referral to Ophthalmology -     POCT glycosylated hemoglobin (Hb A1C) -     insulin lispro (HUMALOG KWIKPEN) 100 UNIT/ML KwikPen; INJECT 10 UNITS INTO THE SKIN 3 TIMES DAILY. -     Insulin Glargine, 1 Unit Dial, (TOUJEO SOLOSTAR) 300 UNIT/ML SOPN; Inject 60 Units into the skin daily.  Acquired hypothyroidism- Her TSH is in the normal range.  She will remain on the current dose of levothyroxine. -     TSH; Future -     levothyroxine (SYNTHROID) 50 MCG tablet; Take 1 tablet (50 mcg total) by mouth daily.  Chronic renal disease, stage 3, moderately decreased glomerular filtration rate (GFR) between 30-59 mL/min/1.73 square meter ([place])- Her renal function is stable.  She will avoid nephrotoxic agents.  Will continue to maintain control of her  blood pressure and her blood sugar. -     Basic metabolic panel; Future  Pure hypertriglyceridemia- I have asked her to start taking vascepa for cardiovascular risk reduction. -      Triglycerides; Future -     Icosapent Ethyl (VASCEPA) 1 g CAPS; Take 2 capsules (2 g total) by mouth 2 (two) times daily.  Type 2 diabetes mellitus with retinopathy of both eyes, without long-term current use of insulin, macular edema presence unspecified, unspecified retinopathy severity ([facility]) -     insulin lispro (HUMALOG KWIKPEN) 100 UNIT/ML KwikPen; INJECT 10 UNITS INTO THE SKIN 3 TIMES DAILY. -     Insulin Glargine, 1 Unit Dial, (TOUJEO SOLOSTAR) 300 UNIT/ML SOPN; Inject 60 Units into the skin daily.   I have discontinued Aldine Contes "Chris"'s Omega-3, desvenlafaxine, omeprazole, FreeStyle Libre 14 Day Reader, FreeStyle Libre 14 Day Sensor, Multiple Vitamins-Minerals (CENTRUM SILVER PO), and benzonatate. I have changed her HumaLOG KwikPen to insulin lispro. I have also changed her levothyroxine and Toujeo SoloStar. Additionally, I am having her start on Contour Next One and Vascepa. Lastly, I am having her maintain her aspirin, cetirizine, cyanocobalamin, Vitamin D3, vitamin C, OneTouch Verio, zolpidem, glucose blood, OneTouch Delica Lancets 72W, Insulin Pen Needle, ALPRAZolam, gabapentin, vortioxetine HBr, lisinopril-hydrochlorothiazide, metoprolol tartrate, rosuvastatin, ipratropium, albuterol, and BD Pen Needle Nano U/F.  Meds ordered this encounter  Medications  . Blood Glucose Monitoring Suppl (CONTOUR NEXT ONE) KIT    Sig: 1 Act by Does not apply route 2 (two) times a day.    Dispense:  2 kit    Refill:  0  . levothyroxine (SYNTHROID) 50 MCG tablet    Sig: Take 1 tablet (50 mcg total) by mouth daily.    Dispense:  90 tablet    Refill:  1  . Icosapent Ethyl (VASCEPA) 1 g CAPS    Sig: Take 2 capsules (2 g total) by mouth 2 (two) times daily.    Dispense:  360 capsule    Refill:  1  . insulin lispro (HUMALOG KWIKPEN) 100 UNIT/ML KwikPen    Sig: INJECT 10 UNITS INTO THE SKIN 3 TIMES DAILY.    Dispense:  15 mL    Refill:  1  . Insulin Glargine, 1 Unit Dial, (TOUJEO SOLOSTAR) 300  UNIT/ML SOPN    Sig: Inject 60 Units into the skin daily.    Dispense:  4.5 mL    Refill:  1     Follow-up: Return in about 6 months (around 01/28/2020).  Scarlette Calico, MD

## 2019-07-28 NOTE — Patient Instructions (Signed)
Type 2 Diabetes Mellitus, Diagnosis, Adult Type 2 diabetes (type 2 diabetes mellitus) is a long-term (chronic) disease. In type 2 diabetes, one or both of these problems may be present:  The pancreas does not make enough of a hormone called insulin.  Cells in the body do not respond properly to insulin that the body makes (insulin resistance). Normally, insulin allows blood sugar (glucose) to enter cells in the body. The cells use glucose for energy. Insulin resistance or lack of insulin causes excess glucose to build up in the blood instead of going into cells. As a result, high blood glucose (hyperglycemia) develops. What increases the risk? The following factors may make you more likely to develop type 2 diabetes:  Having a family member with type 2 diabetes.  Being overweight or obese.  Having an inactive (sedentary) lifestyle.  Having been diagnosed with insulin resistance.  Having a history of prediabetes, gestational diabetes, or polycystic ovary syndrome (PCOS).  Being of American-Indian, African-American, Hispanic/Latino, or Asian/Pacific Islander descent. What are the signs or symptoms? In the early stage of this condition, you may not have symptoms. Symptoms develop slowly and may include:  Increased thirst (polydipsia).  Increased hunger(polyphagia).  Increased urination (polyuria).  Increased urination during the night (nocturia).  Unexplained weight loss.  Frequent infections that keep coming back (recurring).  Fatigue.  Weakness.  Vision changes, such as blurry vision.  Cuts or bruises that are slow to heal.  Tingling or numbness in the hands or feet.  Dark patches on the skin (acanthosis nigricans). How is this diagnosed? This condition is diagnosed based on your symptoms, your medical history, a physical exam, and your blood glucose level. Your blood glucose may be checked with one or more of the following blood tests:  A fasting blood glucose (FBG)  test. You will not be allowed to eat (you will fast) for 8 hours or longer before a blood sample is taken.  A random blood glucose test. This test checks blood glucose at any time of day regardless of when you ate.  An A1c (hemoglobin A1c) blood test. This test provides information about blood glucose control over the previous 2-3 months.  An oral glucose tolerance test (OGTT). This test measures your blood glucose at two times: ? After fasting. This is your baseline blood glucose level. ? Two hours after drinking a beverage that contains glucose. You may be diagnosed with type 2 diabetes if:  Your FBG level is 126 mg/dL (7.0 mmol/L) or higher.  Your random blood glucose level is 200 mg/dL (11.1 mmol/L) or higher.  Your A1c level is 6.5% or higher.  Your OGTT result is higher than 200 mg/dL (11.1 mmol/L). These blood tests may be repeated to confirm your diagnosis. How is this treated? Your treatment may be managed by a specialist called an endocrinologist. Type 2 diabetes may be treated by following instructions from your health care provider about:  Making diet and lifestyle changes. This may include: ? Following an individualized nutrition plan that is developed by a diet and nutrition specialist (registered dietitian). ? Exercising regularly. ? Finding ways to manage stress.  Checking your blood glucose level as often as told.  Taking diabetes medicines or insulin daily. This helps to keep your blood glucose levels in the healthy range. ? If you use insulin, you may need to adjust the dosage depending on how physically active you are and what foods you eat. Your health care provider will tell you how to adjust your dosage.    Taking medicines to help prevent complications from diabetes, such as: ? Aspirin. ? Medicine to lower cholesterol. ? Medicine to control blood pressure. Your health care provider will set individualized treatment goals for you. Your goals will be based on  your age, other medical conditions you have, and how you respond to diabetes treatment. Generally, the goal of treatment is to maintain the following blood glucose levels:  Before meals (preprandial): 80-130 mg/dL (4.4-7.2 mmol/L).  After meals (postprandial): below 180 mg/dL (10 mmol/L).  A1c level: less than 7%. Follow these instructions at home: Questions to ask your health care provider  Consider asking the following questions: ? Do I need to meet with a diabetes educator? ? Where can I find a support group for people with diabetes? ? What equipment will I need to manage my diabetes at home? ? What diabetes medicines do I need, and when should I take them? ? How often do I need to check my blood glucose? ? What number can I call if I have questions? ? When is my next appointment? General instructions  Take over-the-counter and prescription medicines only as told by your health care provider.  Keep all follow-up visits as told by your health care provider. This is important.  For more information about diabetes, visit: ? American Diabetes Association (ADA): www.diabetes.org ? American Association of Diabetes Educators (AADE): www.diabeteseducator.org Contact a health care provider if:  Your blood glucose is at or above 240 mg/dL (13.3 mmol/L) for 2 days in a row.  You have been sick or have had a fever for 2 days or longer, and you are not getting better.  You have any of the following problems for more than 6 hours: ? You cannot eat or drink. ? You have nausea and vomiting. ? You have diarrhea. Get help right away if:  Your blood glucose is lower than 54 mg/dL (3.0 mmol/L).  You become confused or you have trouble thinking clearly.  You have difficulty breathing.  You have moderate or large ketone levels in your urine. Summary  Type 2 diabetes (type 2 diabetes mellitus) is a long-term (chronic) disease. In type 2 diabetes, the pancreas does not make enough of a  hormone called insulin, or cells in the body do not respond properly to insulin that the body makes (insulin resistance).  This condition is treated by making diet and lifestyle changes and taking diabetes medicines or insulin.  Your health care provider will set individualized treatment goals for you. Your goals will be based on your age, other medical conditions you have, and how you respond to diabetes treatment.  Keep all follow-up visits as told by your health care provider. This is important. This information is not intended to replace advice given to you by your health care provider. Make sure you discuss any questions you have with your health care provider. Document Released: 12/17/2005 Document Revised: 02/14/2018 Document Reviewed: 01/20/2016 Elsevier Patient Education  2020 [employer]  

## 2019-07-29 MED ORDER — TOUJEO SOLOSTAR 300 UNIT/ML ~~LOC~~ SOPN: 60.0000 [IU] | PEN_INJECTOR | Freq: Every day | SUBCUTANEOUS | 1 refills | Status: DC

## 2019-07-29 MED ORDER — INSULIN LISPRO (1 UNIT DIAL) 100 UNIT/ML (KWIKPEN): PEN_INJECTOR | SUBCUTANEOUS | 1 refills | Status: DC

## 2019-07-31 ENCOUNTER — Other Ambulatory Visit: Payer: Self-pay | Admitting: Internal Medicine

## 2019-07-31 DIAGNOSIS — E039 Hypothyroidism, unspecified: Secondary | ICD-10-CM

## 2019-07-31 DIAGNOSIS — I10 Essential (primary) hypertension: Secondary | ICD-10-CM

## 2019-08-01 ENCOUNTER — Encounter: Payer: Self-pay | Admitting: Internal Medicine

## 2019-08-02 ENCOUNTER — Other Ambulatory Visit: Payer: Self-pay | Admitting: Internal Medicine

## 2019-08-02 DIAGNOSIS — I10 Essential (primary) hypertension: Secondary | ICD-10-CM

## 2019-08-02 DIAGNOSIS — E1129 Type 2 diabetes mellitus with other diabetic kidney complication: Secondary | ICD-10-CM

## 2019-08-02 DIAGNOSIS — IMO0002 Reserved for concepts with insufficient information to code with codable children: Secondary | ICD-10-CM

## 2019-08-03 MED ORDER — COOL BLOOD GLUCOSE TEST STRIPS VI STRP: ORAL_STRIP | 3 refills | Status: DC

## 2019-08-19 ENCOUNTER — Ambulatory Visit: Payer: Medicare Other | Admitting: Obstetrics and Gynecology

## 2019-09-08 DIAGNOSIS — N183 Chronic kidney disease, stage 3 (moderate): Secondary | ICD-10-CM | POA: Diagnosis not present

## 2019-09-08 DIAGNOSIS — E1165 Type 2 diabetes mellitus with hyperglycemia: Secondary | ICD-10-CM | POA: Diagnosis not present

## 2019-09-08 DIAGNOSIS — K760 Fatty (change of) liver, not elsewhere classified: Secondary | ICD-10-CM | POA: Diagnosis not present

## 2019-09-08 DIAGNOSIS — Z794 Long term (current) use of insulin: Secondary | ICD-10-CM | POA: Diagnosis not present

## 2019-09-15 ENCOUNTER — Other Ambulatory Visit: Payer: Self-pay | Admitting: Internal Medicine

## 2019-09-15 DIAGNOSIS — E1129 Type 2 diabetes mellitus with other diabetic kidney complication: Secondary | ICD-10-CM

## 2019-09-15 DIAGNOSIS — E11319 Type 2 diabetes mellitus with unspecified diabetic retinopathy without macular edema: Secondary | ICD-10-CM

## 2019-09-15 DIAGNOSIS — IMO0002 Reserved for concepts with insufficient information to code with codable children: Secondary | ICD-10-CM

## 2019-10-08 ENCOUNTER — Ambulatory Visit: Payer: Medicare Other

## 2019-10-11 ENCOUNTER — Other Ambulatory Visit: Payer: Self-pay | Admitting: Internal Medicine

## 2019-10-11 DIAGNOSIS — IMO0002 Reserved for concepts with insufficient information to code with codable children: Secondary | ICD-10-CM

## 2019-10-11 DIAGNOSIS — E1129 Type 2 diabetes mellitus with other diabetic kidney complication: Secondary | ICD-10-CM

## 2019-10-11 DIAGNOSIS — E11319 Type 2 diabetes mellitus with unspecified diabetic retinopathy without macular edema: Secondary | ICD-10-CM

## 2019-10-15 ENCOUNTER — Ambulatory Visit: Payer: Medicare Other

## 2019-10-20 ENCOUNTER — Other Ambulatory Visit: Payer: Self-pay | Admitting: Internal Medicine

## 2019-10-20 DIAGNOSIS — E785 Hyperlipidemia, unspecified: Secondary | ICD-10-CM

## 2019-10-20 DIAGNOSIS — E11319 Type 2 diabetes mellitus with unspecified diabetic retinopathy without macular edema: Secondary | ICD-10-CM

## 2019-10-21 ENCOUNTER — Ambulatory Visit (INDEPENDENT_AMBULATORY_CARE_PROVIDER_SITE_OTHER): Payer: Medicare Other

## 2019-10-21 ENCOUNTER — Other Ambulatory Visit: Payer: Self-pay

## 2019-10-21 DIAGNOSIS — Z23 Encounter for immunization: Secondary | ICD-10-CM

## 2019-10-31 ENCOUNTER — Other Ambulatory Visit: Payer: Self-pay | Admitting: Internal Medicine

## 2019-11-11 ENCOUNTER — Other Ambulatory Visit: Payer: Self-pay | Admitting: Internal Medicine

## 2019-11-11 DIAGNOSIS — E1129 Type 2 diabetes mellitus with other diabetic kidney complication: Secondary | ICD-10-CM

## 2019-11-11 DIAGNOSIS — E11319 Type 2 diabetes mellitus with unspecified diabetic retinopathy without macular edema: Secondary | ICD-10-CM

## 2019-11-11 DIAGNOSIS — IMO0002 Reserved for concepts with insufficient information to code with codable children: Secondary | ICD-10-CM

## 2019-11-12 NOTE — Progress Notes (Deleted)
69 y.o. [id number] Married Caucasian female here for annual exam.    PCP:     Patient's last menstrual period was 12/31/1993.           Sexually active: {yes no:314532}  The current method of family planning is status post hysterectomy.    Exercising: {yes no:314532}  {types:19826} Smoker:  no  Health Maintenance: Pap: 2009 normal History of abnormal Pap:***  Yes, years ago MMG: *** 03-09-15 category b density birads 1:neg Colonoscopy: 11-14-16 normal;next 10 years BMD: ***2016 Result :Osteopenia TDaP:  2019 Gardasil:   no HIV:*** Hep C: 2017 Neg Screening Labs:  Hb today: ***, Urine today: ***   reports that she has never smoked. She has never used smokeless tobacco. She reports that she does not drink alcohol or use drugs.  Past Medical History:  Diagnosis Date  . Allergy   . Anxiety   . Arthritis   . Asthma   . Blood transfusion without reported diagnosis 2002   with knee replacement  . Depression   . Diabetes mellitus   . Diabetes mellitus type 2 with retinopathy ([place])   . Diverticulitis   . Dyspareunia    vaginal dryness  . Fibroid    reason for hysterectomy  . GERD (gastroesophageal reflux disease)   . 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 Heart disease   . Hyperlipidemia   . Hypertension   . IBS (irritable bowel syndrome)   . Lichen sclerosus   . OSA (obstructive sleep apnea)   . Osteopenia 2009  . Psoriasis   . Sleep apnea   . STD (sexually transmitted disease)    Hx HPV  . Thyroid disease     Past Surgical History:  Procedure Laterality Date  . ABDOMINAL HYSTERECTOMY  1995   TAH/LSO--Dr. Ubaldo Glassing  . [place]  . FOOT SURGERY Bilateral   . NASAL SINUS SURGERY    . OOPHORECTOMY    . PARTIAL COLECTOMY     -RSO  . ROTATOR CUFF REPAIR Left   . TOTAL KNEE ARTHROPLASTY Bilateral 2002, 2004    Current Outpatient Medications  Medication Sig Dispense Refill  . albuterol (PROAIR HFA) 108  (90 Base) MCG/ACT inhaler Inhale 2 puffs into the lungs every 6 (six) hours as needed. 18 g 5  . ALPRAZolam (XANAX) 1 MG tablet Take 1 mg by mouth 3 (three) times daily as needed for anxiety.    . Ascorbic Acid (VITAMIN C) 1000 MG tablet Take 1,000 mg by mouth daily.    Marland Kitchen aspirin 81 MG tablet Take 81 mg by mouth daily.     . Blood Glucose Monitoring Suppl (CONTOUR NEXT ONE) KIT 1 Act by Does not apply route 2 (two) times a day. 2 kit 0  . Blood Glucose Monitoring Suppl (ONETOUCH VERIO) W/DEVICE KIT 1 each by Does not apply route daily. 1 kit 0  . cetirizine (ZYRTEC) 10 MG tablet Take 10 mg by mouth daily.     . Cholecalciferol (VITAMIN D3) 1000 UNITS CAPS Take 1 capsule by mouth daily.      . cyanocobalamin 100 MCG tablet Take 100 mcg by mouth daily.      Marland Kitchen gabapentin (NEURONTIN) 100 MG capsule Take 600 mg by mouth 2 (two) times daily.     Marland Kitchen glucose blood (COOL BLOOD GLUCOSE TEST STRIPS) test strip Use to test blood sugar twice daily. DX: E11.8 200 each 3  . glucose blood (ONETOUCH  VERIO) test strip CHECK BLOOD SUGAR QID. 200 each 11  . Icosapent Ethyl (VASCEPA) 1 g CAPS Take 2 capsules (2 g total) by mouth 2 (two) times daily. 360 capsule 1  . insulin lispro (HUMALOG KWIKPEN) 100 UNIT/ML KwikPen INJECT 10 UNITS INTO THE SKIN 3 TIMES DAILY. 15 mL 1  . Insulin Pen Needle (INSUPEN PEN NEEDLES) 33G X 4 MM MISC Inject 1 Act into the skin 4 (four) times daily - after meals and at bedtime. 200 each 11  . ipratropium (ATROVENT HFA) 17 MCG/ACT inhaler USE 2 PUFFS EVERY 4 HOURS  AS NEEDED FOR WHEEZING. 12.9 g 3  . levothyroxine (SYNTHROID) 50 MCG tablet TAKE 1 TABLET BY MOUTH  DAILY 90 tablet 1  . lisinopril-hydrochlorothiazide (ZESTORETIC) 20-12.5 MG tablet TAKE 1 TABLET BY MOUTH  DAILY 90 tablet 1  . metoprolol tartrate (LOPRESSOR) 25 MG tablet TAKE 1 TABLET BY MOUTH TWO  TIMES DAILY 180 tablet 1  . ONETOUCH DELICA LANCETS 77O MISC Inject 1 Act into the skin 4 (four) times daily - after meals and at  bedtime. 200 each 11  . rosuvastatin (CRESTOR) 20 MG tablet Take 1 tablet (20 mg total) by mouth daily. 90 tablet 3  . SURE COMFORT PEN NEEDLES 32G X 4 MM MISC USE WITH INSULIN FOR DOSING 4 TIMES DAILY-AFTER MEALS AND AT BEDTIME. 100 each 2  . TOUJEO SOLOSTAR 300 UNIT/ML SOPN INJECT 60 UNITS INTO THE SKIN DAILY. 4.5 mL 0  . vortioxetine HBr (TRINTELLIX) 20 MG TABS tablet Take 20 mg by mouth daily.    Marland Kitchen zolpidem (AMBIEN) 10 MG tablet Take 10 mg by mouth at bedtime as needed for sleep.     No current facility-administered medications for this visit.     Family History  Problem Relation Age of Onset  . COPD Mother   . Diabetes Mother   . Heart attack Father 77  . Hypertension Father   . Heart attack Sister 49  . Diabetes Sister   . Hypertension Sister   . Diabetes Brother   . Hypertension Sister   . Diabetes Sister   . Hyperlipidemia Other        fhx  . Hypertension Other        fhx  . Cancer Other        ovarian/grandmother  . Ovarian cancer Maternal Aunt   . Diabetes Maternal Aunt   . Stomach cancer Maternal Grandmother   . Liver cancer Maternal Grandmother     Review of Systems  Exam:   LMP 12/31/1993     General appearance: alert, cooperative and appears stated age Head: normocephalic, without obvious abnormality, atraumatic Neck: no adenopathy, supple, symmetrical, trachea midline and thyroid normal to inspection and palpation Lungs: clear to auscultation bilaterally Breasts: normal appearance, no masses or tenderness, No nipple retraction or dimpling, No nipple discharge or bleeding, No axillary adenopathy Heart: regular rate and rhythm Abdomen: soft, non-tender; no masses, no organomegaly Extremities: extremities normal, atraumatic, no cyanosis or edema Skin: skin color, texture, turgor normal. No rashes or lesions Lymph nodes: cervical, supraclavicular, and axillary nodes normal. Neurologic: grossly normal  Pelvic: External genitalia:  no lesions               No abnormal inguinal nodes palpated.              Urethra:  normal appearing urethra with no masses, tenderness or lesions              Bartholins and Skenes:  normal                 Vagina: normal appearing vagina with normal color and discharge, no lesions              Cervix: no lesions              Pap taken: {yes no:314532} Bimanual Exam:  Uterus:  normal size, contour, position, consistency, mobility, non-tender              Adnexa: no mass, fullness, tenderness              Rectal exam: {yes no:314532}.  Confirms.              Anus:  normal sphincter tone, no lesions  Chaperone was present for exam.  Assessment:   Well woman visit with normal exam.   Plan: Mammogram screening discussed. Self breast awareness reviewed. Pap and HR HPV as above. Guidelines for Calcium, Vitamin D, regular exercise program including cardiovascular and weight bearing exercise.   Follow up annually and prn.   Additional counseling given.  {yes [id number]. _______ minutes face to face time of which over 50% was spent in counseling.    After visit summary provided.

## 2019-11-16 ENCOUNTER — Ambulatory Visit: Payer: Medicare Other | Admitting: Obstetrics and Gynecology

## 2019-12-17 ENCOUNTER — Other Ambulatory Visit: Payer: Self-pay | Admitting: Internal Medicine

## 2019-12-17 DIAGNOSIS — IMO0002 Reserved for concepts with insufficient information to code with codable children: Secondary | ICD-10-CM

## 2019-12-17 DIAGNOSIS — I10 Essential (primary) hypertension: Secondary | ICD-10-CM

## 2019-12-17 DIAGNOSIS — E1129 Type 2 diabetes mellitus with other diabetic kidney complication: Secondary | ICD-10-CM

## 2019-12-20 ENCOUNTER — Other Ambulatory Visit: Payer: Self-pay | Admitting: Internal Medicine

## 2019-12-20 DIAGNOSIS — E1129 Type 2 diabetes mellitus with other diabetic kidney complication: Secondary | ICD-10-CM

## 2019-12-20 DIAGNOSIS — IMO0002 Reserved for concepts with insufficient information to code with codable children: Secondary | ICD-10-CM

## 2019-12-20 DIAGNOSIS — E11319 Type 2 diabetes mellitus with unspecified diabetic retinopathy without macular edema: Secondary | ICD-10-CM

## 2020-01-02 ENCOUNTER — Other Ambulatory Visit: Payer: Self-pay | Admitting: Internal Medicine

## 2020-01-02 DIAGNOSIS — E1129 Type 2 diabetes mellitus with other diabetic kidney complication: Secondary | ICD-10-CM

## 2020-01-02 DIAGNOSIS — I10 Essential (primary) hypertension: Secondary | ICD-10-CM

## 2020-01-02 DIAGNOSIS — IMO0002 Reserved for concepts with insufficient information to code with codable children: Secondary | ICD-10-CM

## 2020-01-10 ENCOUNTER — Other Ambulatory Visit: Payer: Self-pay | Admitting: Internal Medicine

## 2020-01-10 DIAGNOSIS — E039 Hypothyroidism, unspecified: Secondary | ICD-10-CM

## 2020-01-10 DIAGNOSIS — I10 Essential (primary) hypertension: Secondary | ICD-10-CM

## 2020-01-11 ENCOUNTER — Ambulatory Visit: Payer: Medicare Other

## 2020-01-14 ENCOUNTER — Encounter: Payer: Self-pay | Admitting: Internal Medicine

## 2020-01-20 ENCOUNTER — Other Ambulatory Visit: Payer: Self-pay | Admitting: Internal Medicine

## 2020-01-20 DIAGNOSIS — IMO0002 Reserved for concepts with insufficient information to code with codable children: Secondary | ICD-10-CM

## 2020-01-20 DIAGNOSIS — E11319 Type 2 diabetes mellitus with unspecified diabetic retinopathy without macular edema: Secondary | ICD-10-CM

## 2020-01-20 DIAGNOSIS — E1129 Type 2 diabetes mellitus with other diabetic kidney complication: Secondary | ICD-10-CM

## 2020-01-26 ENCOUNTER — Other Ambulatory Visit: Payer: Self-pay | Admitting: Internal Medicine

## 2020-01-26 DIAGNOSIS — E039 Hypothyroidism, unspecified: Secondary | ICD-10-CM

## 2020-01-26 DIAGNOSIS — I10 Essential (primary) hypertension: Secondary | ICD-10-CM

## 2020-01-29 ENCOUNTER — Other Ambulatory Visit: Payer: Self-pay | Admitting: Internal Medicine

## 2020-01-29 DIAGNOSIS — E11319 Type 2 diabetes mellitus with unspecified diabetic retinopathy without macular edema: Secondary | ICD-10-CM

## 2020-01-29 DIAGNOSIS — IMO0002 Reserved for concepts with insufficient information to code with codable children: Secondary | ICD-10-CM

## 2020-01-29 DIAGNOSIS — E1129 Type 2 diabetes mellitus with other diabetic kidney complication: Secondary | ICD-10-CM

## 2020-02-10 ENCOUNTER — Other Ambulatory Visit: Payer: Self-pay

## 2020-02-10 ENCOUNTER — Ambulatory Visit (INDEPENDENT_AMBULATORY_CARE_PROVIDER_SITE_OTHER): Payer: Medicare Other | Admitting: Internal Medicine

## 2020-02-10 ENCOUNTER — Encounter: Payer: Self-pay | Admitting: Internal Medicine

## 2020-02-10 VITALS — BP 118/62 | HR 61 | Temp 98.2°F | Resp 16 | Ht 61.0 in | Wt 232.0 lb

## 2020-02-10 DIAGNOSIS — E11319 Type 2 diabetes mellitus with unspecified diabetic retinopathy without macular edema: Secondary | ICD-10-CM | POA: Diagnosis not present

## 2020-02-10 DIAGNOSIS — E781 Pure hyperglyceridemia: Secondary | ICD-10-CM

## 2020-02-10 DIAGNOSIS — IMO0002 Reserved for concepts with insufficient information to code with codable children: Secondary | ICD-10-CM

## 2020-02-10 DIAGNOSIS — N1831 Chronic kidney disease, stage 3a: Secondary | ICD-10-CM | POA: Diagnosis not present

## 2020-02-10 DIAGNOSIS — E1165 Type 2 diabetes mellitus with hyperglycemia: Secondary | ICD-10-CM

## 2020-02-10 DIAGNOSIS — E785 Hyperlipidemia, unspecified: Secondary | ICD-10-CM | POA: Diagnosis not present

## 2020-02-10 DIAGNOSIS — K760 Fatty (change of) liver, not elsewhere classified: Secondary | ICD-10-CM

## 2020-02-10 DIAGNOSIS — E039 Hypothyroidism, unspecified: Secondary | ICD-10-CM

## 2020-02-10 DIAGNOSIS — I10 Essential (primary) hypertension: Secondary | ICD-10-CM

## 2020-02-10 DIAGNOSIS — E1129 Type 2 diabetes mellitus with other diabetic kidney complication: Secondary | ICD-10-CM | POA: Diagnosis not present

## 2020-02-10 DIAGNOSIS — Z0000 Encounter for general adult medical examination without abnormal findings: Secondary | ICD-10-CM

## 2020-02-10 DIAGNOSIS — Z1231 Encounter for screening mammogram for malignant neoplasm of breast: Secondary | ICD-10-CM

## 2020-02-10 LAB — URINALYSIS, ROUTINE W REFLEX MICROSCOPIC
Bilirubin Urine: NEGATIVE
Hgb urine dipstick: NEGATIVE
Ketones, ur: NEGATIVE
Leukocytes,Ua: NEGATIVE
Nitrite: POSITIVE — AB
RBC / HPF: NONE SEEN (ref 0–?)
Specific Gravity, Urine: 1.025 (ref 1.000–1.030)
Total Protein, Urine: NEGATIVE
Urine Glucose: NEGATIVE
Urobilinogen, UA: 0.2 (ref 0.0–1.0)
pH: 5.5 (ref 5.0–8.0)

## 2020-02-10 LAB — TSH: TSH: 7.96 u[IU]/mL — ABNORMAL HIGH (ref 0.35–4.50)

## 2020-02-10 LAB — CBC WITH DIFFERENTIAL/PLATELET
Basophils Absolute: 0.1 10*3/uL (ref 0.0–0.1)
Basophils Relative: 1 % (ref 0.0–3.0)
Eosinophils Absolute: 0.3 10*3/uL (ref 0.0–0.7)
Eosinophils Relative: 3.2 % (ref 0.0–5.0)
HCT: 42.1 % (ref 36.0–46.0)
Hemoglobin: 13.6 g/dL (ref 12.0–15.0)
Lymphocytes Relative: 32.6 % (ref 12.0–46.0)
Lymphs Abs: 2.8 10*3/uL (ref 0.7–4.0)
MCHC: 32.4 g/dL (ref 30.0–36.0)
MCV: 91.4 fl (ref 78.0–100.0)
Monocytes Absolute: 0.7 10*3/uL (ref 0.1–1.0)
Monocytes Relative: 8.7 % (ref 3.0–12.0)
Neutro Abs: 4.6 10*3/uL (ref 1.4–7.7)
Neutrophils Relative %: 54.5 % (ref 43.0–77.0)
Platelets: 205 10*3/uL (ref 150.0–400.0)
RBC: 4.6 Mil/uL (ref 3.87–5.11)
RDW: 13.7 % (ref 11.5–15.5)
WBC: 8.5 10*3/uL (ref 4.0–10.5)

## 2020-02-10 LAB — MICROALBUMIN / CREATININE URINE RATIO
Creatinine,U: 137.5 mg/dL
Microalb Creat Ratio: 0.5 mg/g (ref 0.0–30.0)
Microalb, Ur: <0.7 mg/dL (ref 0.0–1.9)

## 2020-02-10 LAB — PROTIME-INR
INR: 1 ratio (ref 0.8–1.0)
Prothrombin Time: 11.1 s (ref 9.6–13.1)

## 2020-02-10 LAB — POCT GLYCOSYLATED HEMOGLOBIN (HGB A1C): Hemoglobin A1C: 6.9 % — AB (ref 4.0–5.6)

## 2020-02-10 LAB — HEPATIC FUNCTION PANEL
ALT: 9 U/L (ref 0–35)
AST: 15 U/L (ref 0–37)
Albumin: 4.2 g/dL (ref 3.5–5.2)
Alkaline Phosphatase: 77 U/L (ref 39–117)
Bilirubin, Direct: 0.1 mg/dL (ref 0.0–0.3)
Total Bilirubin: 0.6 mg/dL (ref 0.2–1.2)
Total Protein: 7.2 g/dL (ref 6.0–8.3)

## 2020-02-10 LAB — LIPID PANEL
Cholesterol: 138 mg/dL (ref 0–200)
HDL: 47.1 mg/dL (ref >39.00)
LDL Cholesterol: 60 mg/dL (ref 0–99)
NonHDL: 91.29
Total CHOL/HDL Ratio: 3
Triglycerides: 158 mg/dL — ABNORMAL HIGH (ref 0.0–149.0)
VLDL: 31.6 mg/dL (ref 0.0–40.0)

## 2020-02-10 LAB — BASIC METABOLIC PANEL
BUN: 19 mg/dL (ref 6–23)
CO2: 30 mEq/L (ref 19–32)
Calcium: 9.2 mg/dL (ref 8.4–10.5)
Chloride: 101 mEq/L (ref 96–112)
Creatinine, Ser: 1.53 mg/dL — ABNORMAL HIGH (ref 0.40–1.20)
GFR: 33.82 mL/min — ABNORMAL LOW (ref >60.00)
Glucose, Bld: 127 mg/dL — ABNORMAL HIGH (ref 70–99)
Potassium: 3.6 mEq/L (ref 3.5–5.1)
Sodium: 140 mEq/L (ref 135–145)

## 2020-02-10 LAB — VITAMIN D 25 HYDROXY (VIT D DEFICIENCY, FRACTURES): VITD: 30.23 ng/mL (ref 30.00–100.00)

## 2020-02-10 MED ORDER — LEVOTHYROXINE SODIUM 75 MCG PO TABS: 75.0000 ug | ORAL_TABLET | Freq: Every day | ORAL | 0 refills | Status: DC

## 2020-02-10 MED ORDER — TOUJEO SOLOSTAR 300 UNIT/ML ~~LOC~~ SOPN: 60.0000 [IU] | PEN_INJECTOR | Freq: Every day | SUBCUTANEOUS | 1 refills | Status: DC

## 2020-02-10 MED ORDER — LISINOPRIL 20 MG PO TABS: 20.0000 mg | ORAL_TABLET | Freq: Every day | ORAL | 1 refills | Status: DC

## 2020-02-10 NOTE — Progress Notes (Signed)
Subjective:  Patient ID: Meredith Hayden, female    DOB: 1950-07-06  Age: 70 y.o. MRN: [id number]  CC: Annual Exam, Hyperlipidemia, Hypothyroidism, Hypertension, and Diabetes  This visit occurred during the SARS-CoV-2 public health emergency.  Safety protocols were in place, including screening questions prior to the visit, additional usage of staff PPE, and extensive cleaning of exam room while observing appropriate contact time as indicated for disinfecting solutions.    HPI Meredith Hayden presents for a CPX.  She tells me her blood sugars have been well controlled.  She denies polys.  She is active and denies any recent episodes of chest pain, shortness of breath, palpitations, edema, or fatigue.   Outpatient Medications Prior to Visit  Medication Sig Dispense Refill  . albuterol (PROAIR HFA) 108 (90 Base) MCG/ACT inhaler Inhale 2 puffs into the lungs every 6 (six) hours as needed. 18 g 5  . ALPRAZolam (XANAX) 1 MG tablet Take 1 mg by mouth 3 (three) times daily as needed for anxiety.    Marland Kitchen aspirin 81 MG tablet Take 81 mg by mouth daily.     . Blood Glucose Monitoring Suppl (CONTOUR NEXT ONE) KIT 1 Act by Does not apply route 2 (two) times a day. 2 kit 0  . Blood Glucose Monitoring Suppl (ONETOUCH VERIO) W/DEVICE KIT 1 each by Does not apply route daily. 1 kit 0  . cetirizine (ZYRTEC) 10 MG tablet Take 10 mg by mouth daily.     . Cholecalciferol (VITAMIN D3) 1000 UNITS CAPS Take 1 capsule by mouth daily.      . cyanocobalamin 100 MCG tablet Take 100 mcg by mouth daily.      Marland Kitchen gabapentin (NEURONTIN) 100 MG capsule Take 600 mg by mouth 2 (two) times daily.     Marland Kitchen glucose blood (COOL BLOOD GLUCOSE TEST STRIPS) test strip Use to test blood sugar twice daily. DX: E11.8 200 each 3  . glucose blood (ONETOUCH VERIO) test strip CHECK BLOOD SUGAR QID. 200 each 11  . Insulin Pen Needle (INSUPEN PEN NEEDLES) 33G X 4 MM MISC Inject 1 Act into the skin 4 (four) times daily - after  meals and at bedtime. 200 each 11  . ipratropium (ATROVENT HFA) 17 MCG/ACT inhaler USE 2 PUFFS EVERY 4 HOURS  AS NEEDED FOR WHEEZING. 12.9 g 3  . metoprolol tartrate (LOPRESSOR) 25 MG tablet TAKE 1 TABLET BY MOUTH TWO  TIMES DAILY 180 tablet 1  . ONETOUCH DELICA LANCETS 52W MISC Inject 1 Act into the skin 4 (four) times daily - after meals and at bedtime. 200 each 11  . rosuvastatin (CRESTOR) 20 MG tablet Take 1 tablet (20 mg total) by mouth daily. 90 tablet 3  . SURE COMFORT PEN NEEDLES 32G X 4 MM MISC USE WITH INSULIN FOR DOSING 4 TIMES DAILY-AFTER MEALS AND AT BEDTIME. 100 each 2  . vortioxetine HBr (TRINTELLIX) 20 MG TABS tablet Take 20 mg by mouth daily.    . Ascorbic Acid (VITAMIN C) 1000 MG tablet Take 1,000 mg by mouth daily.    Marland Kitchen levothyroxine (SYNTHROID) 50 MCG tablet TAKE 1 TABLET BY MOUTH  DAILY 90 tablet 1  . lisinopril-hydrochlorothiazide (ZESTORETIC) 20-12.5 MG tablet TAKE 1 TABLET BY MOUTH  DAILY 90 tablet 1  . TOUJEO SOLOSTAR 300 UNIT/ML SOPN INJECT 60 UNITS INTO THE SKIN DAILY. 4.5 mL 0  . zolpidem (AMBIEN) 10 MG tablet Take 10 mg by mouth at bedtime as needed for sleep.    . insulin lispro (  HUMALOG KWIKPEN) 100 UNIT/ML KwikPen INJECT 10 UNITS 3 TIMES A DAY. (Patient not taking: Reported on 02/10/2020) 15 mL 0  . Icosapent Ethyl (VASCEPA) 1 g CAPS Take 2 capsules (2 g total) by mouth 2 (two) times daily. (Patient not taking: Reported on 02/10/2020) 360 capsule 1   No facility-administered medications prior to visit.    ROS Review of Systems  Constitutional: Negative.  Negative for appetite change, diaphoresis, fatigue and unexpected weight change.  HENT: Negative.   Eyes: Negative for visual disturbance.  Respiratory: Negative for cough, chest tightness, shortness of breath and wheezing.   Cardiovascular: Negative for chest pain, palpitations and leg swelling.  Gastrointestinal: Negative for abdominal pain, constipation, diarrhea, nausea and vomiting.  Endocrine: Negative  for cold intolerance, heat intolerance, polydipsia, polyphagia and polyuria.  Genitourinary: Negative.  Negative for difficulty urinating.  Musculoskeletal: Negative.  Negative for arthralgias, back pain, myalgias and neck pain.  Skin: Negative.  Negative for color change and pallor.  Neurological: Negative.  Negative for dizziness, weakness, light-headedness, numbness and headaches.  Hematological: Negative for adenopathy. Does not bruise/bleed easily.  Psychiatric/Behavioral: Negative.     Objective:  BP 118/62 (BP Location: Left Arm, Patient Position: Sitting, Cuff Size: Large)   Pulse 61   Temp 98.2 F (36.8 C) (Oral)   Resp 16   Ht '5\' 1"'  (1.549 m)   Wt 232 lb (105.2 kg)   LMP 12/31/1993   SpO2 96%   BMI 43.84 kg/m   BP Readings from Last 3 Encounters:  02/10/20 118/62  07/28/19 120/78  01/07/19 136/76    Wt Readings from Last 3 Encounters:  02/10/20 232 lb (105.2 kg)  07/28/19 234 lb (106.1 kg)  01/07/19 242 lb (109.8 kg)    Physical Exam Vitals reviewed.  Constitutional:      Appearance: She is obese.  HENT:     Nose: Nose normal.     Mouth/Throat:     Mouth: Mucous membranes are moist.  Eyes:     General: No scleral icterus.    Conjunctiva/sclera: Conjunctivae normal.  Cardiovascular:     Rate and Rhythm: Normal rate and regular rhythm.     Heart sounds: No murmur.  Pulmonary:     Effort: Pulmonary effort is normal.     Breath sounds: No stridor. No wheezing, rhonchi or rales.  Abdominal:     General: Abdomen is protuberant. Bowel sounds are normal. There is no distension.     Palpations: Abdomen is soft. There is no hepatomegaly or splenomegaly.     Tenderness: There is no abdominal tenderness.  Musculoskeletal:        General: Normal range of motion.     Cervical back: Neck supple.     Right lower leg: No edema.     Left lower leg: No edema.  Lymphadenopathy:     Cervical: No cervical adenopathy.  Skin:    General: Skin is warm and dry.    Neurological:     General: No focal deficit present.     Mental Status: She is alert.  Psychiatric:        Mood and Affect: Mood normal.        Behavior: Behavior normal.     Lab Results  Component Value Date   WBC 8.5 02/10/2020   HGB 13.6 02/10/2020   HCT 42.1 02/10/2020   PLT 205.0 02/10/2020   GLUCOSE 127 (H) 02/10/2020   CHOL 138 02/10/2020   TRIG 158.0 (H) 02/10/2020   HDL 47.10 02/10/2020  LDLDIRECT 69.0 01/07/2019   LDLCALC 60 02/10/2020   ALT 9 02/10/2020   AST 15 02/10/2020   NA 140 02/10/2020   K 3.6 02/10/2020   CL 101 02/10/2020   CREATININE 1.53 (H) 02/10/2020   BUN 19 02/10/2020   CO2 30 02/10/2020   TSH 7.96 (H) 02/10/2020   INR 1.0 02/10/2020   HGBA1C 6.9 (A) 02/10/2020   MICROALBUR <0.7 02/10/2020    US RENAL ARTERY DUPLEX COMPLETE  Result Date: 02/26/2019 CLINICAL DATA:  Chronic kidney disease stage 3 EXAM: RENAL/URINARY TRACT ULTRASOUND RENAL DUPLEX DOPPLER ULTRASOUND COMPARISON:  05/19/2004 CT with contrast FINDINGS: Right Kidney: Length: 9.7 cm. Echogenicity within normal limits. No mass or hydronephrosis visualized. Left Kidney: Length: 10.4 cm. Echogenicity within normal limits. No mass or hydronephrosis visualized. Bladder:  Not fully distended.  No gross abnormality by ultrasound. RENAL DUPLEX ULTRASOUND Right Renal Artery Velocities: Origin:  57 cm/sec Mid:  154 cm/sec [place]:  76 cm/sec Interlobar:  54 cm/sec Arcuate:  24 cm/sec Left Renal Artery Velocities: Origin:  113 cm/sec Mid:  108 cm/sec Hilum:  101 cm/sec Interlobar:  46 cm/sec Arcuate:  20 cm/sec Aortic Velocity:  101 cm/sec Right Renal-Aortic Ratios: Origin: 0.6 Mid:  1.5 Hilum: 0.8 Interlobar: 0.5 Arcuate: 0.2 Left Renal-Aortic Ratios: Origin: 1.1 Mid: 1.1 Hilum: 1.0 Interlobar: 0.5 Arcuate: 0.2 No significant renal artery stenosis by renal duplex. Renal veins appear patent. Bladder under distended but grossly normal. Exam is limited because of obesity. IMPRESSION: No significant renal  artery stenosis by ultrasound criteria. No other acute renal abnormality by ultrasound. Electronically Signed   By: Jerilynn Mages.  Shick M.D.   On: 02/26/2019 12:01    Assessment & Plan:   Meredith Hayden was seen today for annual exam, hyperlipidemia, hypothyroidism, hypertension and diabetes.  Diagnoses and all orders for this visit:  Essential hypertension- Her systolic blood pressure is down to 118 and she has had a slight decline in her renal function.  I therefore recommend that she stop taking the thiazide diuretic and to stay on the ACE inhibitor.  Her electrolytes are normal. -     CBC with Differential/Platelet -     VITAMIN D 25 Hydroxy (Vit-D Deficiency, Fractures) -     Urinalysis, Routine w reflex microscopic -     lisinopril (ZESTRIL) 20 MG tablet; Take 1 tablet (20 mg total) by mouth daily.  Fatty liver disease, nonalcoholic- Her LFTs are normal now. -     Protime-INR -     Hepatic function panel  Acquired hypothyroidism- Her TSH is up to 7.96.  I recommended that she increase her dose of levothyroxine. -     TSH -     levothyroxine (SYNTHROID) 75 MCG tablet; Take 1 tablet (75 mcg total) by mouth daily before breakfast.  Stage 3a chronic kidney disease- She has had a mild decrease in her renal function.  She will avoid nephrotoxic agents.  I recommended that she stop taking the thiazide diuretic.  Will continue the ACE inhibitor at the current dose. -     CBC with Differential/Platelet -     Basic metabolic panel -     Urinalysis, Routine w reflex microscopic -     Microalbumin / creatinine urine ratio  Pure hypertriglyceridemia- Her triglycerides remain mildly elevated.  She is not willing to take icosapent ethyl for CV risk reduction. -     Lipid panel  Routine general medical examination at a health care facility- Exam completed, labs reviewed, vaccines reviewed and updated, screening  for cervical cancer and colon cancer up-to-date, she is referred for screening mammogram.  Visit for  screening mammogram -     MM DIGITAL SCREENING BILATERAL; Future  Hyperlipidemia LDL goal <130- She has achieved her LDL goal is doing well on the statin. -     Hepatic function panel  Diabetes mellitus with renal manifestations, uncontrolled ([place])- See below -     Cancel: Hemoglobin A1c -     Microalbumin / creatinine urine ratio -     HM Diabetes Foot Exam -     Ambulatory referral to Ophthalmology -     Insulin Glargine, 1 Unit Dial, (TOUJEO SOLOSTAR) 300 UNIT/ML SOPN; Inject 60 Units into the skin daily. -     lisinopril (ZESTRIL) 20 MG tablet; Take 1 tablet (20 mg total) by mouth daily.  Type 2 diabetes mellitus with retinopathy of both eyes, without long-term current use of insulin, macular edema presence unspecified, unspecified retinopathy severity ([place])- Her A1c is at 6.9%.  She has achieved adequate glycemic control. -     Insulin Glargine, 1 Unit Dial, (TOUJEO SOLOSTAR) 300 UNIT/ML SOPN; Inject 60 Units into the skin daily. -     POCT glycosylated hemoglobin (Hb A1C) -     lisinopril (ZESTRIL) 20 MG tablet; Take 1 tablet (20 mg total) by mouth daily.   I have discontinued Aldine Contes "Chris"'s vitamin C, zolpidem, Vascepa, levothyroxine, and lisinopril-hydrochlorothiazide. I have also changed her [employer]. Additionally, I am having her start on levothyroxine and lisinopril. Lastly, I am having her maintain her aspirin, cetirizine, cyanocobalamin, Vitamin D3, OneTouch Verio, glucose blood, OneTouch Delica Lancets 27C, Insulin Pen Needle, ALPRAZolam, gabapentin, vortioxetine HBr, ipratropium, albuterol, Contour Next One, metoprolol tartrate, Cool Blood Glucose Test Strips, rosuvastatin, Sure Comfort Pen Needles, and insulin lispro.  Meds ordered this encounter  Medications  . Insulin Glargine, 1 Unit Dial, (TOUJEO SOLOSTAR) 300 UNIT/ML SOPN    Sig: Inject 60 Units into the skin daily.    Dispense:  4.5 mL    Refill:  1  . levothyroxine (SYNTHROID) 75 MCG tablet     Sig: Take 1 tablet (75 mcg total) by mouth daily before breakfast.    Dispense:  90 tablet    Refill:  0  . lisinopril (ZESTRIL) 20 MG tablet    Sig: Take 1 tablet (20 mg total) by mouth daily.    Dispense:  90 tablet    Refill:  1     Follow-up: Return in about 6 months (around 08/09/2020).  Scarlette Calico, MD

## 2020-02-10 NOTE — Patient Instructions (Signed)
Health Maintenance, Female Adopting a healthy lifestyle and getting preventive care are important in promoting health and wellness. Ask your health care provider about:  The right schedule for you to have regular tests and exams.  Things you can do on your own to prevent diseases and keep yourself healthy. What should I know about diet, weight, and exercise? Eat a healthy diet   Eat a diet that includes plenty of vegetables, fruits, low-fat dairy products, and lean protein.  Do not eat a lot of foods that are high in solid fats, added sugars, or sodium. Maintain a healthy weight Body mass index (BMI) is used to identify weight problems. It estimates body fat based on height and weight. Your health care provider can help determine your BMI and help you achieve or maintain a healthy weight. Get regular exercise Get regular exercise. This is one of the most important things you can do for your health. Most adults should:  Exercise for at least 150 minutes each week. The exercise should increase your heart rate and make you sweat (moderate-intensity exercise).  Do strengthening exercises at least twice a week. This is in addition to the moderate-intensity exercise.  Spend less time sitting. Even light physical activity can be beneficial. Watch cholesterol and blood lipids Have your blood tested for lipids and cholesterol at 68 years of age, then have this test every 5 years. Have your cholesterol levels checked more often if:  Your lipid or cholesterol levels are high.  You are older than 68 years of age.  You are at high risk for heart disease. What should I know about cancer screening? Depending on your health history and family history, you may need to have cancer screening at various ages. This may include screening for:  Breast cancer.  Cervical cancer.  Colorectal cancer.  Skin cancer.  Lung cancer. What should I know about heart disease, diabetes, and high blood  pressure? Blood pressure and heart disease  High blood pressure causes heart disease and increases the risk of stroke. This is more likely to develop in people who have high blood pressure readings, are of African descent, or are overweight.  Have your blood pressure checked: ? Every 3-5 years if you are 18-39 years of age. ? Every year if you are 40 years old or older. Diabetes Have regular diabetes screenings. This checks your fasting blood sugar level. Have the screening done:  Once every three years after age 40 if you are at a normal weight and have a low risk for diabetes.  More often and at a younger age if you are overweight or have a high risk for diabetes. What should I know about preventing infection? Hepatitis B If you have a higher risk for hepatitis B, you should be screened for this virus. Talk with your health care provider to find out if you are at risk for hepatitis B infection. Hepatitis C Testing is recommended for:  Everyone born from 1945 through 1965.  Anyone with known risk factors for hepatitis C. Sexually transmitted infections (STIs)  Get screened for STIs, including gonorrhea and chlamydia, if: ? You are sexually active and are younger than 68 years of age. ? You are older than 68 years of age and your health care provider tells you that you are at risk for this type of infection. ? Your sexual activity has changed since you were last screened, and you are at increased risk for chlamydia or gonorrhea. Ask your health care provider if   you are at risk.  Ask your health care provider about whether you are at high risk for HIV. Your health care provider may recommend a prescription medicine to help prevent HIV infection. If you choose to take medicine to prevent HIV, you should first get tested for HIV. You should then be tested every 3 months for as long as you are taking the medicine. Pregnancy  If you are about to stop having your period (premenopausal) and  you may become pregnant, seek counseling before you get pregnant.  Take 400 to 800 micrograms (mcg) of folic acid every day if you become pregnant.  Ask for birth control (contraception) if you want to prevent pregnancy. Osteoporosis and menopause Osteoporosis is a disease in which the bones lose minerals and strength with aging. This can result in bone fractures. If you are 65 years old or older, or if you are at risk for osteoporosis and fractures, ask your health care provider if you should:  Be screened for bone loss.  Take a calcium or vitamin D supplement to lower your risk of fractures.  Be given hormone replacement therapy (HRT) to treat symptoms of menopause. Follow these instructions at home: Lifestyle  Do not use any products that contain nicotine or tobacco, such as cigarettes, e-cigarettes, and chewing tobacco. If you need help quitting, ask your health care provider.  Do not use street drugs.  Do not share needles.  Ask your health care provider for help if you need support or information about quitting drugs. Alcohol use  Do not drink alcohol if: ? Your health care provider tells you not to drink. ? You are pregnant, may be pregnant, or are planning to become pregnant.  If you drink alcohol: ? Limit how much you use to 0-1 drink a day. ? Limit intake if you are breastfeeding.  Be aware of how much alcohol is in your drink. In the [place] one drink equals one 12 oz bottle of beer (355 mL), one 5 oz glass of wine (148 mL), or one 1 oz glass of hard liquor (44 mL). General instructions  Schedule regular health, dental, and eye exams.  Stay current with your vaccines.  Tell your health care provider if: ? You often feel depressed. ? You have ever been abused or do not feel safe at home. Summary  Adopting a healthy lifestyle and getting preventive care are important in promoting health and wellness.  Follow your health care provider's instructions about healthy  diet, exercising, and getting tested or screened for diseases.  Follow your health care provider's instructions on monitoring your cholesterol and blood pressure. This information is not intended to replace advice given to you by your health care provider. Make sure you discuss any questions you have with your health care provider. Document Revised: 12/10/2018 Document Reviewed: 12/10/2018 Elsevier Patient Education  2020 [employer]  

## 2020-02-16 ENCOUNTER — Encounter: Payer: Self-pay | Admitting: Internal Medicine

## 2020-03-01 ENCOUNTER — Other Ambulatory Visit: Payer: Self-pay | Admitting: Internal Medicine

## 2020-03-09 ENCOUNTER — Other Ambulatory Visit: Payer: Self-pay | Admitting: Internal Medicine

## 2020-03-09 DIAGNOSIS — Z1231 Encounter for screening mammogram for malignant neoplasm of breast: Secondary | ICD-10-CM

## 2020-03-22 ENCOUNTER — Ambulatory Visit: Payer: Medicare Other

## 2020-03-31 ENCOUNTER — Other Ambulatory Visit: Payer: Self-pay

## 2020-04-04 ENCOUNTER — Ambulatory Visit: Payer: Medicare Other | Admitting: Obstetrics and Gynecology

## 2020-04-04 ENCOUNTER — Encounter: Payer: Self-pay | Admitting: Obstetrics and Gynecology

## 2020-04-04 NOTE — Progress Notes (Deleted)
70 y.o. G48P2002 Married Caucasian female here for annual exam.    PCP:     Patient's last menstrual period was 12/31/1993.           Sexually active: {yes no:314532}  The current method of family planning is status post hysterectomy.    Exercising: {yes no:314532}  {types:19826} Smoker:  no  Health Maintenance: Pap:  2009 normal History of abnormal Pap:  Yes, years ago MMG:  ***03-09-15 category b density birads 1:neg Colonoscopy:  2017 BMD:   2016  Result  Osteopenia TDaP:  2019 Gardasil:   n/a HIV:  Hep C: 10/12/16 Neg Screening Labs:  PCP   reports that she has never smoked. She has never used smokeless tobacco. She reports that she does not drink alcohol or use drugs.  Past Medical History:  Diagnosis Date  . Allergy   . Anxiety   . Arthritis   . Asthma   . Blood transfusion without reported diagnosis 2002   with knee replacement  . Depression   . Diabetes mellitus   . Diabetes mellitus type 2 with retinopathy ([place])   . Diverticulitis   . Dyspareunia    vaginal dryness  . Fibroid    reason for hysterectomy  . GERD (gastroesophageal reflux disease)   . 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 Heart disease   . Hyperlipidemia   . Hypertension   . IBS (irritable bowel syndrome)   . Lichen sclerosus   . OSA (obstructive sleep apnea)   . Osteopenia 2009  . Psoriasis   . Sleep apnea   . STD (sexually transmitted disease)    Hx HPV  . Thyroid disease     Past Surgical History:  Procedure Laterality Date  . ABDOMINAL HYSTERECTOMY  1995   TAH/LSO--Dr. Ubaldo Glassing  . [place]  . FOOT SURGERY Bilateral   . NASAL SINUS SURGERY    . OOPHORECTOMY    . PARTIAL COLECTOMY     -RSO  . ROTATOR CUFF REPAIR Left   . TOTAL KNEE ARTHROPLASTY Bilateral 2002, 2004    Current Outpatient Medications  Medication Sig Dispense Refill  . albuterol (PROAIR HFA) 108 (90 Base) MCG/ACT inhaler Inhale 2 puffs into the  lungs every 6 (six) hours as needed. 18 g 5  . ALPRAZolam (XANAX) 1 MG tablet Take 1 mg by mouth 3 (three) times daily as needed for anxiety.    Marland Kitchen aspirin 81 MG tablet Take 81 mg by mouth daily.     . Blood Glucose Monitoring Suppl (CONTOUR NEXT ONE) KIT 1 Act by Does not apply route 2 (two) times a day. 2 kit 0  . Blood Glucose Monitoring Suppl (ONETOUCH VERIO) W/DEVICE KIT 1 each by Does not apply route daily. 1 kit 0  . cetirizine (ZYRTEC) 10 MG tablet Take 10 mg by mouth daily.     . Cholecalciferol (VITAMIN D3) 1000 UNITS CAPS Take 1 capsule by mouth daily.      . cyanocobalamin 100 MCG tablet Take 100 mcg by mouth daily.      Marland Kitchen gabapentin (NEURONTIN) 100 MG capsule Take 600 mg by mouth 2 (two) times daily.     Marland Kitchen glucose blood (COOL BLOOD GLUCOSE TEST STRIPS) test strip Use to test blood sugar twice daily. DX: E11.8 200 each 3  . glucose blood (ONETOUCH VERIO) test strip CHECK BLOOD SUGAR QID. 200 each 11  . HUMALOG KWIKPEN 100 UNIT/ML KwikPen INJECT  10 UNITS 3 TIMES A DAY. 15 mL 1  . Insulin Glargine, 1 Unit Dial, (TOUJEO SOLOSTAR) 300 UNIT/ML SOPN Inject 60 Units into the skin daily. 4.5 mL 1  . Insulin Pen Needle (INSUPEN PEN NEEDLES) 33G X 4 MM MISC Inject 1 Act into the skin 4 (four) times daily - after meals and at bedtime. 200 each 11  . ipratropium (ATROVENT HFA) 17 MCG/ACT inhaler USE 2 PUFFS EVERY 4 HOURS  AS NEEDED FOR WHEEZING. 12.9 g 3  . levothyroxine (SYNTHROID) 75 MCG tablet Take 1 tablet (75 mcg total) by mouth daily before breakfast. 90 tablet 0  . lisinopril (ZESTRIL) 20 MG tablet Take 1 tablet (20 mg total) by mouth daily. 90 tablet 1  . metoprolol tartrate (LOPRESSOR) 25 MG tablet TAKE 1 TABLET BY MOUTH TWO  TIMES DAILY 180 tablet 1  . ONETOUCH DELICA LANCETS 01E MISC Inject 1 Act into the skin 4 (four) times daily - after meals and at bedtime. 200 each 11  . rosuvastatin (CRESTOR) 20 MG tablet Take 1 tablet (20 mg total) by mouth daily. 90 tablet 3  . SURE COMFORT PEN  NEEDLES 32G X 4 MM MISC USE WITH INSULIN FOR DOSING 4 TIMES DAILY-AFTER MEALS AND AT BEDTIME. 100 each 2  . vortioxetine HBr (TRINTELLIX) 20 MG TABS tablet Take 20 mg by mouth daily.     No current facility-administered medications for this visit.    Family History  Problem Relation Age of Onset  . COPD Mother   . Diabetes Mother   . Heart attack Father 40  . Hypertension Father   . Heart attack Sister 31  . Diabetes Sister   . Hypertension Sister   . Diabetes Brother   . Hypertension Sister   . Diabetes Sister   . Hyperlipidemia Other        fhx  . Hypertension Other        fhx  . Cancer Other        ovarian/grandmother  . Ovarian cancer Maternal Aunt   . Diabetes Maternal Aunt   . Stomach cancer Maternal Grandmother   . Liver cancer Maternal Grandmother     Review of Systems  Exam:   LMP 12/31/1993     General appearance: alert, cooperative and appears stated age Head: normocephalic, without obvious abnormality, atraumatic Neck: no adenopathy, supple, symmetrical, trachea midline and thyroid normal to inspection and palpation Lungs: clear to auscultation bilaterally Breasts: normal appearance, no masses or tenderness, No nipple retraction or dimpling, No nipple discharge or bleeding, No axillary adenopathy Heart: regular rate and rhythm Abdomen: soft, non-tender; no masses, no organomegaly Extremities: extremities normal, atraumatic, no cyanosis or edema Skin: skin color, texture, turgor normal. No rashes or lesions Lymph nodes: cervical, supraclavicular, and axillary nodes normal. Neurologic: grossly normal  Pelvic: External genitalia:  no lesions              No abnormal inguinal nodes palpated.              Urethra:  normal appearing urethra with no masses, tenderness or lesions              Bartholins and Skenes: normal                 Vagina: normal appearing vagina with normal color and discharge, no lesions              Cervix: no lesions  Pap  taken: {yes no:314532} Bimanual Exam:  Uterus:  normal size, contour, position, consistency, mobility, non-tender              Adnexa: no mass, fullness, tenderness              Rectal exam: {yes no:314532}.  Confirms.              Anus:  normal sphincter tone, no lesions  Chaperone was present for exam.  Assessment:   Well woman visit with normal exam.   Plan: Mammogram screening discussed. Self breast awareness reviewed. Pap and HR HPV as above. Guidelines for Calcium, Vitamin D, regular exercise program including cardiovascular and weight bearing exercise.   Follow up annually and prn.   Additional counseling given.  {yes [id number]. _______ minutes face to face time of which over 50% was spent in counseling.    After visit summary provided.

## 2020-04-17 ENCOUNTER — Other Ambulatory Visit: Payer: Self-pay | Admitting: Internal Medicine

## 2020-04-19 DIAGNOSIS — E11319 Type 2 diabetes mellitus with unspecified diabetic retinopathy without macular edema: Secondary | ICD-10-CM | POA: Diagnosis not present

## 2020-04-19 DIAGNOSIS — H524 Presbyopia: Secondary | ICD-10-CM | POA: Diagnosis not present

## 2020-04-19 LAB — HM DIABETES EYE EXAM

## 2020-04-28 ENCOUNTER — Other Ambulatory Visit: Payer: Self-pay | Admitting: Internal Medicine

## 2020-04-28 DIAGNOSIS — E1129 Type 2 diabetes mellitus with other diabetic kidney complication: Secondary | ICD-10-CM

## 2020-04-28 DIAGNOSIS — IMO0002 Reserved for concepts with insufficient information to code with codable children: Secondary | ICD-10-CM

## 2020-04-28 DIAGNOSIS — E11319 Type 2 diabetes mellitus with unspecified diabetic retinopathy without macular edema: Secondary | ICD-10-CM

## 2020-05-03 DIAGNOSIS — L821 Other seborrheic keratosis: Secondary | ICD-10-CM | POA: Diagnosis not present

## 2020-05-03 DIAGNOSIS — L565 Disseminated superficial actinic porokeratosis (DSAP): Secondary | ICD-10-CM | POA: Diagnosis not present

## 2020-05-03 DIAGNOSIS — L900 Lichen sclerosus et atrophicus: Secondary | ICD-10-CM | POA: Diagnosis not present

## 2020-05-08 ENCOUNTER — Other Ambulatory Visit: Payer: Self-pay | Admitting: Internal Medicine

## 2020-05-08 DIAGNOSIS — I10 Essential (primary) hypertension: Secondary | ICD-10-CM

## 2020-05-10 NOTE — Telephone Encounter (Signed)
Please refill as per office routine med refill policy (all routine meds refilled for 3 mo or monthly per pt preference up to one year from last visit, then month to month grace period for 3 mo, then further med refills will have to be denied)  

## 2020-05-19 ENCOUNTER — Ambulatory Visit: Payer: Medicare Other | Admitting: Internal Medicine

## 2020-05-23 ENCOUNTER — Ambulatory Visit (INDEPENDENT_AMBULATORY_CARE_PROVIDER_SITE_OTHER): Payer: Medicare Other | Admitting: Internal Medicine

## 2020-05-23 ENCOUNTER — Other Ambulatory Visit: Payer: Self-pay

## 2020-05-23 ENCOUNTER — Encounter: Payer: Self-pay | Admitting: Internal Medicine

## 2020-05-23 VITALS — BP 112/68 | HR 76 | Temp 99.2°F | Resp 16 | Ht 61.0 in | Wt 228.0 lb

## 2020-05-23 DIAGNOSIS — K580 Irritable bowel syndrome with diarrhea: Secondary | ICD-10-CM

## 2020-05-23 DIAGNOSIS — N1831 Chronic kidney disease, stage 3a: Secondary | ICD-10-CM | POA: Diagnosis not present

## 2020-05-23 DIAGNOSIS — IMO0002 Reserved for concepts with insufficient information to code with codable children: Secondary | ICD-10-CM

## 2020-05-23 DIAGNOSIS — I10 Essential (primary) hypertension: Secondary | ICD-10-CM

## 2020-05-23 DIAGNOSIS — E039 Hypothyroidism, unspecified: Secondary | ICD-10-CM

## 2020-05-23 DIAGNOSIS — R10815 Periumbilic abdominal tenderness: Secondary | ICD-10-CM

## 2020-05-23 DIAGNOSIS — E1165 Type 2 diabetes mellitus with hyperglycemia: Secondary | ICD-10-CM

## 2020-05-23 DIAGNOSIS — E1129 Type 2 diabetes mellitus with other diabetic kidney complication: Secondary | ICD-10-CM | POA: Diagnosis not present

## 2020-05-23 DIAGNOSIS — R32 Unspecified urinary incontinence: Secondary | ICD-10-CM

## 2020-05-23 DIAGNOSIS — R159 Full incontinence of feces: Secondary | ICD-10-CM

## 2020-05-23 DIAGNOSIS — D72829 Elevated white blood cell count, unspecified: Secondary | ICD-10-CM

## 2020-05-23 LAB — CBC WITH DIFFERENTIAL/PLATELET
Basophils Absolute: 0.1 10*3/uL (ref 0.0–0.1)
Basophils Relative: 0.9 % (ref 0.0–3.0)
Eosinophils Absolute: 0.4 10*3/uL (ref 0.0–0.7)
Eosinophils Relative: 3.2 % (ref 0.0–5.0)
HCT: 41.4 % (ref 36.0–46.0)
Hemoglobin: 13.6 g/dL (ref 12.0–15.0)
Lymphocytes Relative: 38.8 % (ref 12.0–46.0)
Lymphs Abs: 4.9 10*3/uL — ABNORMAL HIGH (ref 0.7–4.0)
MCHC: 32.9 g/dL (ref 30.0–36.0)
MCV: 91.7 fl (ref 78.0–100.0)
Monocytes Absolute: 1.1 10*3/uL — ABNORMAL HIGH (ref 0.1–1.0)
Monocytes Relative: 8.5 % (ref 3.0–12.0)
Neutro Abs: 6.1 10*3/uL (ref 1.4–7.7)
Neutrophils Relative %: 48.6 % (ref 43.0–77.0)
Platelets: 239 10*3/uL (ref 150.0–400.0)
RBC: 4.51 Mil/uL (ref 3.87–5.11)
RDW: 14.3 % (ref 11.5–15.5)
WBC: 12.6 10*3/uL — ABNORMAL HIGH (ref 4.0–10.5)

## 2020-05-23 LAB — HEMOGLOBIN A1C: Hgb A1c MFr Bld: 7 % — ABNORMAL HIGH (ref 4.6–6.5)

## 2020-05-23 LAB — BASIC METABOLIC PANEL
BUN: 27 mg/dL — ABNORMAL HIGH (ref 6–23)
CO2: 30 mEq/L (ref 19–32)
Calcium: 9.1 mg/dL (ref 8.4–10.5)
Chloride: 102 mEq/L (ref 96–112)
Creatinine, Ser: 1.41 mg/dL — ABNORMAL HIGH (ref 0.40–1.20)
GFR: 37.13 mL/min — ABNORMAL LOW (ref >60.00)
Glucose, Bld: 48 mg/dL — CL (ref 70–99)
Potassium: 3.7 mEq/L (ref 3.5–5.1)
Sodium: 140 mEq/L (ref 135–145)

## 2020-05-23 LAB — TSH: TSH: 2.22 u[IU]/mL (ref 0.35–4.50)

## 2020-05-23 MED ORDER — DICYCLOMINE HCL 10 MG PO CAPS: 10.0000 mg | ORAL_CAPSULE | Freq: Three times a day (TID) | ORAL | 3 refills | Status: DC

## 2020-05-23 NOTE — Patient Instructions (Signed)
Hypothyroidism  Hypothyroidism is when the thyroid gland does not make enough of certain hormones (it is underactive). The thyroid gland is a small gland located in the lower front part of the neck, just in front of the windpipe (trachea). This gland makes hormones that help control how the body uses food for energy (metabolism) as well as how the heart and brain function. These hormones also play a role in keeping your bones strong. When the thyroid is underactive, it produces too little of the hormones thyroxine (T4) and triiodothyronine (T3). What are the causes? This condition may be caused by:  Hashimoto's disease. This is a disease in which the body's disease-fighting system (immune system) attacks the thyroid gland. This is the most common cause.  Viral infections.  Pregnancy.  Certain medicines.  Birth defects.  Past radiation treatments to the head or neck for cancer.  Past treatment with radioactive iodine.  Past exposure to radiation in the environment.  Past surgical removal of part or all of the thyroid.  Problems with a gland in the center of the brain (pituitary gland).  Lack of enough iodine in the diet. What increases the risk? You are more likely to develop this condition if:  You are female.  You have a family history of thyroid conditions.  You use a medicine called lithium.  You take medicines that affect the immune system (immunosuppressants). What are the signs or symptoms? Symptoms of this condition include:  Feeling as though you have no energy (lethargy).  Not being able to tolerate cold.  Weight gain that is not explained by a change in diet or exercise habits.  Lack of appetite.  Dry skin.  Coarse hair.  Menstrual irregularity.  Slowing of thought processes.  Constipation.  Sadness or depression. How is this diagnosed? This condition may be diagnosed based on:  Your symptoms, your medical history, and a physical exam.  Blood  tests. You may also have imaging tests, such as an ultrasound or MRI. How is this treated? This condition is treated with medicine that replaces the thyroid hormones that your body does not make. After you begin treatment, it may take several weeks for symptoms to go away. Follow these instructions at home:  Take over-the-counter and prescription medicines only as told by your health care provider.  If you start taking any new medicines, tell your health care provider.  Keep all follow-up visits as told by your health care provider. This is important. ? As your condition improves, your dosage of thyroid hormone medicine may change. ? You will need to have blood tests regularly so that your health care provider can monitor your condition. Contact a health care provider if:  Your symptoms do not get better with treatment.  You are taking thyroid replacement medicine and you: ? Sweat a lot. ? Have tremors. ? Feel anxious. ? Lose weight rapidly. ? Cannot tolerate heat. ? Have emotional swings. ? Have diarrhea. ? Feel weak. Get help right away if you have:  Chest pain.  An irregular heartbeat.  A rapid heartbeat.  Difficulty breathing. Summary  Hypothyroidism is when the thyroid gland does not make enough of certain hormones (it is underactive).  When the thyroid is underactive, it produces too little of the hormones thyroxine (T4) and triiodothyronine (T3).  The most common cause is Hashimoto's disease, a disease in which the body's disease-fighting system (immune system) attacks the thyroid gland. The condition can also be caused by viral infections, medicine, pregnancy, or past   radiation treatment to the head or neck.  Symptoms may include weight gain, dry skin, constipation, feeling as though you do not have energy, and not being able to tolerate cold.  This condition is treated with medicine to replace the thyroid hormones that your body does not make. This information  is not intended to replace advice given to you by your health care provider. Make sure you discuss any questions you have with your health care provider. Document Revised: 11/29/2017 Document Reviewed: 11/27/2017 Elsevier Patient Education  2020 [employer]  

## 2020-05-23 NOTE — Progress Notes (Signed)
Subjective:  Patient ID: Meredith Hayden, female    DOB: November 26, 1950  Age: 70 y.o. MRN: [id number]  CC: Hypertension, Hypothyroidism, Diabetes, and Abdominal Pain  This visit occurred during the SARS-CoV-2 public health emergency.  Safety protocols were in place, including screening questions prior to the visit, additional usage of staff PPE, and extensive cleaning of exam room while observing appropriate contact time as indicated for disinfecting solutions.    HPI Meredith Hayden presents for f/up - She complains of a 5-monthhistory of abdominal pain that she describes as intermittent cramping and distention.  She has also had diarrhea and nausea without vomiting.  She complains of a loss of appetite, weight loss, and fatigue.  She has a history of Helicobacter pylori infection and IBS.  She is not currently taking anything for the symptoms.  She also reports that she has become incontinent of stool and urine.  Outpatient Medications Prior to Visit  Medication Sig Dispense Refill  . albuterol (PROAIR HFA) 108 (90 Base) MCG/ACT inhaler Inhale 2 puffs into the lungs every 6 (six) hours as needed. 18 g 5  . ALPRAZolam (XANAX) 1 MG tablet Take 1 mg by mouth 3 (three) times daily as needed for anxiety.    .Marland Kitchenaspirin 81 MG tablet Take 81 mg by mouth daily.     . Blood Glucose Monitoring Suppl (CONTOUR NEXT ONE) KIT 1 Act by Does not apply route 2 (two) times a day. 2 kit 0  . Blood Glucose Monitoring Suppl (ONETOUCH VERIO) W/DEVICE KIT 1 each by Does not apply route daily. 1 kit 0  . cetirizine (ZYRTEC) 10 MG tablet Take 10 mg by mouth daily.     . Cholecalciferol (VITAMIN D3) 1000 UNITS CAPS Take 1 capsule by mouth daily.      . cyanocobalamin 100 MCG tablet Take 100 mcg by mouth daily.      .Marland Kitchengabapentin (NEURONTIN) 100 MG capsule Take 600 mg by mouth 2 (two) times daily.     .Marland Kitchenglucose blood (COOL BLOOD GLUCOSE TEST STRIPS) test strip Use to test blood sugar twice daily. DX: E11.8  200 each 3  . glucose blood (ONETOUCH VERIO) test strip CHECK BLOOD SUGAR QID. 200 each 11  . HUMALOG KWIKPEN 100 UNIT/ML KwikPen INJECT 10 UNITS 3 TIMES A DAY. 15 mL 1  . Insulin Pen Needle (INSUPEN PEN NEEDLES) 33G X 4 MM MISC Inject 1 Act into the skin 4 (four) times daily - after meals and at bedtime. 200 each 11  . ipratropium (ATROVENT HFA) 17 MCG/ACT inhaler USE 2 PUFFS EVERY 4 HOURS  AS NEEDED FOR WHEEZING. 12.9 g 3  . levothyroxine (SYNTHROID) 75 MCG tablet Take 1 tablet (75 mcg total) by mouth daily before breakfast. 90 tablet 0  . lisinopril (ZESTRIL) 20 MG tablet Take 1 tablet (20 mg total) by mouth daily. 90 tablet 1  . metoprolol tartrate (LOPRESSOR) 25 MG tablet TAKE 1 TABLET BY MOUTH  TWICE DAILY 180 tablet 0  . ONETOUCH DELICA LANCETS 316FMISC Inject 1 Act into the skin 4 (four) times daily - after meals and at bedtime. 200 each 11  . rosuvastatin (CRESTOR) 20 MG tablet Take 1 tablet (20 mg total) by mouth daily. 90 tablet 3  . SURE COMFORT PEN NEEDLES 32G X 4 MM MISC USE WITH INSULIN FOR DOSING 4 TIMES DAILY-AFTER MEALS AND AT BEDTIME. 100 each 3  . TOUJEO SOLOSTAR 300 UNIT/ML Solostar Pen INJECT 60 UNITS INTO THE SKIN DAILY. 4.5  mL 0  . vortioxetine HBr (TRINTELLIX) 20 MG TABS tablet Take 20 mg by mouth daily.     No facility-administered medications prior to visit.    ROS Review of Systems  Constitutional: Positive for appetite change, fatigue and unexpected weight change (wt loss). Negative for chills, diaphoresis and fever.  HENT: Negative.  Negative for trouble swallowing.   Eyes: Negative.   Respiratory: Negative for chest tightness, shortness of breath and wheezing.   Cardiovascular: Negative for chest pain, palpitations and leg swelling.  Gastrointestinal: Positive for abdominal distention, abdominal pain, diarrhea and nausea. Negative for anal bleeding, blood in stool, constipation, rectal pain and vomiting.  Endocrine: Negative.   Genitourinary: Negative.   Negative for decreased urine volume, difficulty urinating, dysuria, hematuria, pelvic pain and urgency.  Musculoskeletal: Negative.  Negative for arthralgias and myalgias.  Skin: Negative for color change, pallor and rash.  Neurological: Negative.  Negative for dizziness, weakness, light-headedness and headaches.  Hematological: Negative for adenopathy. Does not bruise/bleed easily.  Psychiatric/Behavioral: Negative.     Objective:  BP 112/68 (BP Location: Left Arm, Patient Position: Sitting, Cuff Size: Large)   Pulse 76   Temp 99.2 F (37.3 C) (Oral)   Resp 16   Ht '5\' 1"'  (1.549 m)   Wt 228 lb (103.4 kg)   LMP 12/31/1993   SpO2 92%   BMI 43.08 kg/m   BP Readings from Last 3 Encounters:  05/23/20 112/68  02/10/20 118/62  07/28/19 120/78    Wt Readings from Last 3 Encounters:  05/23/20 228 lb (103.4 kg)  02/10/20 232 lb (105.2 kg)  07/28/19 234 lb (106.1 kg)    Physical Exam Vitals reviewed. Exam conducted with a chaperone present (Stefannie).  Constitutional:      General: She is not in acute distress.    Appearance: She is obese. She is not toxic-appearing or diaphoretic.  HENT:     Nose: Nose normal.     Mouth/Throat:     Mouth: Mucous membranes are moist.  Eyes:     General: No scleral icterus.    Conjunctiva/sclera: Conjunctivae normal.  Cardiovascular:     Rate and Rhythm: Normal rate and regular rhythm.     Pulses: Normal pulses.     Heart sounds: No murmur.  Pulmonary:     Effort: Pulmonary effort is normal.     Breath sounds: No stridor. No wheezing, rhonchi or rales.  Abdominal:     General: Abdomen is protuberant. Bowel sounds are decreased. There is no distension.     Palpations: Abdomen is soft. There is no hepatomegaly, splenomegaly or mass.     Tenderness: There is abdominal tenderness in the epigastric area and periumbilical area. There is no guarding or rebound.     Hernia: No hernia is present.  Genitourinary:    Rectum: Guaiac result  negative. External hemorrhoid and internal hemorrhoid present. No mass, tenderness or anal fissure. Normal anal tone.  Musculoskeletal:        General: Normal range of motion.     Cervical back: Neck supple.     Right lower leg: No edema.     Left lower leg: No edema.  Lymphadenopathy:     Cervical: No cervical adenopathy.  Skin:    General: Skin is warm and dry.     Coloration: Skin is not jaundiced or pale.  Neurological:     General: No focal deficit present.     Mental Status: She is alert.  Psychiatric:  Mood and Affect: Mood normal.        Behavior: Behavior normal.     Lab Results  Component Value Date   WBC 12.6 (H) 05/23/2020   HGB 13.6 05/23/2020   HCT 41.4 05/23/2020   PLT 239.0 05/23/2020   GLUCOSE 48 (LL) 05/23/2020   CHOL 138 02/10/2020   TRIG 158.0 (H) 02/10/2020   HDL 47.10 02/10/2020   LDLDIRECT 69.0 01/07/2019   LDLCALC 60 02/10/2020   ALT 9 02/10/2020   AST 15 02/10/2020   NA 140 05/23/2020   K 3.7 05/23/2020   CL 102 05/23/2020   CREATININE 1.41 (H) 05/23/2020   BUN 27 (H) 05/23/2020   CO2 30 05/23/2020   TSH 2.22 05/23/2020   INR 1.0 02/10/2020   HGBA1C 7.0 (H) 05/23/2020   MICROALBUR <0.7 02/10/2020    US RENAL ARTERY DUPLEX COMPLETE  Result Date: 02/26/2019 CLINICAL DATA:  Chronic kidney disease stage 3 EXAM: RENAL/URINARY TRACT ULTRASOUND RENAL DUPLEX DOPPLER ULTRASOUND COMPARISON:  05/19/2004 CT with contrast FINDINGS: Right Kidney: Length: 9.7 cm. Echogenicity within normal limits. No mass or hydronephrosis visualized. Left Kidney: Length: 10.4 cm. Echogenicity within normal limits. No mass or hydronephrosis visualized. Bladder:  Not fully distended.  No gross abnormality by ultrasound. RENAL DUPLEX ULTRASOUND Right Renal Artery Velocities: Origin:  57 cm/sec Mid:  154 cm/sec Hilum:  76 cm/sec Interlobar:  54 cm/sec Arcuate:  24 cm/sec Left Renal Artery Velocities: Origin:  113 cm/sec Mid:  108 cm/sec Hilum:  101 cm/sec Interlobar:  46  cm/sec Arcuate:  20 cm/sec Aortic Velocity:  101 cm/sec Right Renal-Aortic Ratios: Origin: 0.6 Mid:  1.5 Hilum: 0.8 Interlobar: 0.5 Arcuate: 0.2 Left Renal-Aortic Ratios: Origin: 1.1 Mid: 1.1 Hilum: 1.0 Interlobar: 0.5 Arcuate: 0.2 No significant renal artery stenosis by renal duplex. Renal veins appear patent. Bladder under distended but grossly normal. Exam is limited because of obesity. IMPRESSION: No significant renal artery stenosis by ultrasound criteria. No other acute renal abnormality by ultrasound. Electronically Signed   By: Jerilynn Mages.  Shick M.D.   On: 02/26/2019 12:01    Assessment & Plan:   Karolyna was seen today for hypertension, hypothyroidism, diabetes and abdominal pain.  Diagnoses and all orders for this visit:  Essential hypertension- Her blood pressure is adequately well controlled. -     Basic metabolic panel; Future -     CBC with Differential/Platelet; Future -     CBC with Differential/Platelet -     Basic metabolic panel  Diabetes mellitus with renal manifestations, uncontrolled ([place])- She has achieved adequate glycemic control. -     Basic metabolic panel; Future -     Hemoglobin A1c; Future -     Hemoglobin A1c -     Basic metabolic panel  Acquired hypothyroidism- Her TSH is in the normal range.  She remain on the current dose of levothyroxine. -     TSH; Future -     TSH  Stage 3a chronic kidney disease- Her renal function is stable.  She will avoid nephrotoxic agents. -     Basic metabolic panel; Future -     Hemoglobin A1c; Future -     Hemoglobin A1c -     Basic metabolic panel  Irritable bowel syndrome with diarrhea- Her symptoms may be related to IBS with diarrhea.  I have asked her to try dicyclomine. -     dicyclomine (BENTYL) 10 MG capsule; Take 1 capsule (10 mg total) by mouth 3 (three) times daily before meals.  Urinary  incontinence, unspecified type -     Ambulatory referral to Urology  Incontinence of feces, unspecified fecal incontinence type -      Ambulatory referral to Gastroenterology  Periumbilical abdominal tenderness without rebound tenderness- She has abdominal tenderness, temperature of 99.2, and a slightly elevated white cell count.  I have asked her to undergo a CT of the abdomen and pelvis with contrast to see if there is an abscess or infection. -     CT Abdomen Pelvis W Contrast; Future  Leukocytosis, unspecified type- See above -     CT Abdomen Pelvis W Contrast; Future   I am having Meredith Hayden "Chris" start on dicyclomine. I am also having her maintain her aspirin, cetirizine, cyanocobalamin, Vitamin D3, OneTouch Verio, glucose blood, OneTouch Delica Lancets 50Y, Insulin Pen Needle, ALPRAZolam, gabapentin, vortioxetine HBr, ipratropium, albuterol, Contour Next One, Cool Blood Glucose Test Strips, rosuvastatin, levothyroxine, lisinopril, HumaLOG KwikPen, Sure Comfort Pen Needles, Toujeo SoloStar, and metoprolol tartrate.  Meds ordered this encounter  Medications  . dicyclomine (BENTYL) 10 MG capsule    Sig: Take 1 capsule (10 mg total) by mouth 3 (three) times daily before meals.    Dispense:  90 capsule    Refill:  3   I spent 60 minutes in preparing to see the patient by review of recent labs, imaging and procedures, obtaining and reviewing separately obtained history, communicating with the patient and family or caregiver, ordering medications, tests or procedures, and documenting clinical information in the EHR including the differential Dx, treatment, and any further evaluation and other management of 1. Essential hypertension 2. Diabetes mellitus with renal manifestations, uncontrolled ([place]) 3. Acquired hypothyroidism 4. Stage 3a chronic kidney disease 5. Irritable bowel syndrome with diarrhea 6. Urinary incontinence, unspecified type 7. Incontinence of feces, unspecified fecal incontinence type 8. Periumbilical abdominal tenderness without rebound tenderness 9. Leukocytosis, unspecified type      Follow-up: Return in about 6 months (around 11/23/2020).  Scarlette Calico, MD

## 2020-05-24 ENCOUNTER — Encounter: Payer: Self-pay | Admitting: Internal Medicine

## 2020-05-24 ENCOUNTER — Other Ambulatory Visit: Payer: Self-pay | Admitting: Internal Medicine

## 2020-05-24 ENCOUNTER — Inpatient Hospital Stay: Admission: RE | Admit: 2020-05-24 | Payer: Medicare Other | Source: Ambulatory Visit

## 2020-05-24 DIAGNOSIS — D72829 Elevated white blood cell count, unspecified: Secondary | ICD-10-CM | POA: Insufficient documentation

## 2020-05-24 DIAGNOSIS — R10815 Periumbilic abdominal tenderness: Secondary | ICD-10-CM | POA: Insufficient documentation

## 2020-05-24 DIAGNOSIS — E11319 Type 2 diabetes mellitus with unspecified diabetic retinopathy without macular edema: Secondary | ICD-10-CM

## 2020-05-24 NOTE — Telephone Encounter (Signed)
Pt has called back and she is unable to do scan today, but will get her scheduled for tomorrow.   Also, she was told to let Dr. [name] know about her blood sugar.  She didn't eat last night cause she wasn't hungry. She woke at 1am and ate a bowl of cereal. Her sugar was 110 before she ate.  She checked again this morning and it was the same at 110. She has not eaten anything yet.   She also wants to know what to do about her long acting and short acting insulin.   thanks

## 2020-05-24 NOTE — Telephone Encounter (Signed)
Spoke to [place] and pt has a CT with contrast scheduled for 11am today. Pt will need to start drinking the contrast soon.  Called number listed for pt and it went straight to vm. LVM informing patient of results and scheduled CT. Called pt emergency contact number and it was no longer Mr. Galuska number. Called dtr number listed on DPR and lvm for her to call back or to have patient call.

## 2020-05-25 ENCOUNTER — Other Ambulatory Visit: Payer: Self-pay

## 2020-05-25 ENCOUNTER — Ambulatory Visit (INDEPENDENT_AMBULATORY_CARE_PROVIDER_SITE_OTHER)
Admission: RE | Admit: 2020-05-25 | Discharge: 2020-05-25 | Disposition: A | Discharge status: Home / Self Care | Payer: Medicare Other | Source: Ambulatory Visit | Attending: Internal Medicine | Admitting: Internal Medicine

## 2020-05-25 ENCOUNTER — Encounter: Payer: Self-pay | Admitting: Internal Medicine

## 2020-05-25 DIAGNOSIS — D72829 Elevated white blood cell count, unspecified: Secondary | ICD-10-CM

## 2020-05-25 DIAGNOSIS — R10815 Periumbilic abdominal tenderness: Secondary | ICD-10-CM

## 2020-05-25 DIAGNOSIS — R10819 Abdominal tenderness, unspecified site: Secondary | ICD-10-CM | POA: Diagnosis not present

## 2020-05-25 MED ORDER — IOHEXOL 300 MG/ML  SOLN
80.0000 mL | Freq: Once | INTRAMUSCULAR | Status: AC | PRN
  Administered 2020-05-25: 80 mL via INTRAVENOUS

## 2020-06-02 ENCOUNTER — Other Ambulatory Visit: Payer: Self-pay | Admitting: Internal Medicine

## 2020-06-02 ENCOUNTER — Telehealth: Payer: Self-pay | Admitting: Internal Medicine

## 2020-06-02 DIAGNOSIS — E039 Hypothyroidism, unspecified: Secondary | ICD-10-CM

## 2020-06-02 NOTE — Progress Notes (Signed)
  Chronic Care Management   Outreach Note  06/02/2020 Name: Binh Furia MRN: [id number] DOB: Jul 21, 1950  Referred by: Janith Lima, MD Reason for referral : No chief complaint on file.   An unsuccessful telephone outreach was attempted today. The patient was referred to the pharmacist for assistance with care management and care coordination.   This note is not being shared with the patient for the following reason: To respect privacy (The patient or proxy has requested that the information not be shared).  Follow Up Plan:   Earney [place] [facility]stream Scheduler

## 2020-06-10 ENCOUNTER — Other Ambulatory Visit: Payer: Self-pay | Admitting: Internal Medicine

## 2020-06-10 DIAGNOSIS — IMO0002 Reserved for concepts with insufficient information to code with codable children: Secondary | ICD-10-CM

## 2020-06-10 DIAGNOSIS — E11319 Type 2 diabetes mellitus with unspecified diabetic retinopathy without macular edema: Secondary | ICD-10-CM

## 2020-06-13 ENCOUNTER — Telehealth: Payer: Self-pay | Admitting: Internal Medicine

## 2020-06-13 NOTE — Progress Notes (Signed)
°  Chronic Care Management   Outreach Note  06/13/2020 Name: Dunya Meiners MRN: [id number] DOB: 12-Feb-1950  Referred by: Janith Lima, MD Reason for referral : No chief complaint on file.   An unsuccessful telephone outreach was attempted today. The patient was referred to the pharmacist for assistance with care management and care coordination. This note is not being shared with the patient for the following reason: To respect privacy (The patient or proxy has requested that the information not be shared).  Follow Up Plan:   Earney [place] Upstream Scheduler

## 2020-06-29 ENCOUNTER — Telehealth: Payer: Self-pay | Admitting: Internal Medicine

## 2020-06-29 NOTE — Progress Notes (Signed)
  Chronic Care Management   Outreach Note  06/29/2020 Name: Meredith Hayden MRN: [id number] DOB: 1950-12-19  Referred by: Janith Lima, MD Reason for referral : No chief complaint on file.   An unsuccessful telephone outreach was attempted today. The patient was referred to the pharmacist for assistance with care management and care coordination. This note is not being shared with the patient for the following reason: To respect privacy (The patient or proxy has requested that the information not be shared).  Follow Up Plan:   Earney [place] Upstream Scheduler

## 2020-07-06 ENCOUNTER — Ambulatory Visit: Payer: Medicare Other | Admitting: Internal Medicine

## 2020-07-14 ENCOUNTER — Other Ambulatory Visit: Payer: Self-pay | Admitting: Family

## 2020-07-14 DIAGNOSIS — I10 Essential (primary) hypertension: Secondary | ICD-10-CM

## 2020-07-18 ENCOUNTER — Other Ambulatory Visit: Payer: Self-pay | Admitting: Internal Medicine

## 2020-07-18 DIAGNOSIS — E11319 Type 2 diabetes mellitus with unspecified diabetic retinopathy without macular edema: Secondary | ICD-10-CM

## 2020-07-18 DIAGNOSIS — IMO0002 Reserved for concepts with insufficient information to code with codable children: Secondary | ICD-10-CM

## 2020-07-25 ENCOUNTER — Telehealth: Payer: Self-pay

## 2020-07-25 ENCOUNTER — Other Ambulatory Visit: Payer: Self-pay | Admitting: Internal Medicine

## 2020-07-25 MED ORDER — COOL BLOOD GLUCOSE TEST STRIPS VI STRP: ORAL_STRIP | 3 refills | Status: DC

## 2020-07-25 NOTE — Telephone Encounter (Signed)
Request to erx contour test strips.

## 2020-08-17 ENCOUNTER — Other Ambulatory Visit: Payer: Self-pay | Admitting: Internal Medicine

## 2020-08-17 DIAGNOSIS — E1129 Type 2 diabetes mellitus with other diabetic kidney complication: Secondary | ICD-10-CM

## 2020-08-17 DIAGNOSIS — IMO0002 Reserved for concepts with insufficient information to code with codable children: Secondary | ICD-10-CM

## 2020-08-17 DIAGNOSIS — E11319 Type 2 diabetes mellitus with unspecified diabetic retinopathy without macular edema: Secondary | ICD-10-CM

## 2020-09-05 ENCOUNTER — Telehealth: Payer: Self-pay

## 2020-09-05 NOTE — Telephone Encounter (Signed)
LVM for pt to rtm my call to R/S AWV with [facility]

## 2020-09-08 ENCOUNTER — Other Ambulatory Visit: Payer: Self-pay | Admitting: Internal Medicine

## 2020-09-08 DIAGNOSIS — IMO0002 Reserved for concepts with insufficient information to code with codable children: Secondary | ICD-10-CM

## 2020-09-08 DIAGNOSIS — E11319 Type 2 diabetes mellitus with unspecified diabetic retinopathy without macular edema: Secondary | ICD-10-CM

## 2020-09-14 ENCOUNTER — Other Ambulatory Visit: Payer: Self-pay | Admitting: Internal Medicine

## 2020-09-14 DIAGNOSIS — IMO0002 Reserved for concepts with insufficient information to code with codable children: Secondary | ICD-10-CM

## 2020-09-14 DIAGNOSIS — E11319 Type 2 diabetes mellitus with unspecified diabetic retinopathy without macular edema: Secondary | ICD-10-CM

## 2020-09-14 DIAGNOSIS — I10 Essential (primary) hypertension: Secondary | ICD-10-CM

## 2020-09-20 ENCOUNTER — Ambulatory Visit: Payer: Medicare Other | Admitting: Internal Medicine

## 2020-09-22 ENCOUNTER — Ambulatory Visit: Payer: Medicare Other

## 2020-10-09 ENCOUNTER — Other Ambulatory Visit: Payer: Self-pay | Admitting: Internal Medicine

## 2020-10-09 DIAGNOSIS — E1129 Type 2 diabetes mellitus with other diabetic kidney complication: Secondary | ICD-10-CM

## 2020-10-09 DIAGNOSIS — E11319 Type 2 diabetes mellitus with unspecified diabetic retinopathy without macular edema: Secondary | ICD-10-CM

## 2020-10-09 DIAGNOSIS — IMO0002 Reserved for concepts with insufficient information to code with codable children: Secondary | ICD-10-CM

## 2020-10-11 ENCOUNTER — Telehealth: Payer: Self-pay | Admitting: Internal Medicine

## 2020-10-11 NOTE — Telephone Encounter (Signed)
LVM for pt to rtn my call to schedule awv with [facility]

## 2020-10-13 ENCOUNTER — Encounter: Payer: Self-pay | Admitting: Internal Medicine

## 2020-10-13 ENCOUNTER — Other Ambulatory Visit: Payer: Self-pay

## 2020-10-13 ENCOUNTER — Ambulatory Visit (INDEPENDENT_AMBULATORY_CARE_PROVIDER_SITE_OTHER): Payer: Medicare Other | Admitting: Internal Medicine

## 2020-10-13 VITALS — BP 126/84 | HR 68 | Temp 99.3°F | Resp 16 | Ht 61.0 in | Wt 241.0 lb

## 2020-10-13 DIAGNOSIS — N1831 Chronic kidney disease, stage 3a: Secondary | ICD-10-CM

## 2020-10-13 DIAGNOSIS — Z23 Encounter for immunization: Secondary | ICD-10-CM | POA: Diagnosis not present

## 2020-10-13 DIAGNOSIS — E1129 Type 2 diabetes mellitus with other diabetic kidney complication: Secondary | ICD-10-CM | POA: Diagnosis not present

## 2020-10-13 DIAGNOSIS — IMO0002 Reserved for concepts with insufficient information to code with codable children: Secondary | ICD-10-CM

## 2020-10-13 DIAGNOSIS — Z1231 Encounter for screening mammogram for malignant neoplasm of breast: Secondary | ICD-10-CM

## 2020-10-13 DIAGNOSIS — E039 Hypothyroidism, unspecified: Secondary | ICD-10-CM | POA: Diagnosis not present

## 2020-10-13 DIAGNOSIS — E1165 Type 2 diabetes mellitus with hyperglycemia: Secondary | ICD-10-CM

## 2020-10-13 DIAGNOSIS — I10 Essential (primary) hypertension: Secondary | ICD-10-CM | POA: Diagnosis not present

## 2020-10-13 LAB — CBC WITH DIFFERENTIAL/PLATELET
Basophils Absolute: 0.1 10*3/uL (ref 0.0–0.1)
Basophils Relative: 0.6 % (ref 0.0–3.0)
Eosinophils Absolute: 0.3 10*3/uL (ref 0.0–0.7)
Eosinophils Relative: 2.6 % (ref 0.0–5.0)
HCT: 38.9 % (ref 36.0–46.0)
Hemoglobin: 12.9 g/dL (ref 12.0–15.0)
Lymphocytes Relative: 33.9 % (ref 12.0–46.0)
Lymphs Abs: 3.5 10*3/uL (ref 0.7–4.0)
MCHC: 33.3 g/dL (ref 30.0–36.0)
MCV: 93.3 fl (ref 78.0–100.0)
Monocytes Absolute: 0.7 10*3/uL (ref 0.1–1.0)
Monocytes Relative: 6.4 % (ref 3.0–12.0)
Neutro Abs: 5.9 10*3/uL (ref 1.4–7.7)
Neutrophils Relative %: 56.5 % (ref 43.0–77.0)
Platelets: 223 10*3/uL (ref 150.0–400.0)
RBC: 4.16 Mil/uL (ref 3.87–5.11)
RDW: 13.8 % (ref 11.5–15.5)
WBC: 10.5 10*3/uL (ref 4.0–10.5)

## 2020-10-13 LAB — URINALYSIS, ROUTINE W REFLEX MICROSCOPIC
Bilirubin Urine: NEGATIVE
Hgb urine dipstick: NEGATIVE
Ketones, ur: NEGATIVE
Nitrite: POSITIVE — AB
RBC / HPF: NONE SEEN (ref 0–?)
Specific Gravity, Urine: 1.025 (ref 1.000–1.030)
Total Protein, Urine: NEGATIVE
Urine Glucose: NEGATIVE
Urobilinogen, UA: 0.2 (ref 0.0–1.0)
pH: 5.5 (ref 5.0–8.0)

## 2020-10-13 LAB — HEMOGLOBIN A1C: Hgb A1c MFr Bld: 7 % — ABNORMAL HIGH (ref 4.6–6.5)

## 2020-10-13 LAB — TSH: TSH: 1.24 u[IU]/mL (ref 0.35–4.50)

## 2020-10-13 MED ORDER — INSULIN LISPRO (1 UNIT DIAL) 100 UNIT/ML (KWIKPEN): 16.0000 [IU] | PEN_INJECTOR | Freq: Three times a day (TID) | SUBCUTANEOUS | 1 refills | Status: DC

## 2020-10-13 MED ORDER — GVOKE HYPOPEN 2-PACK 1 MG/0.2ML ~~LOC~~ SOAJ: 1.0000 | Freq: Every day | SUBCUTANEOUS | 5 refills | Status: DC | PRN

## 2020-10-13 NOTE — Progress Notes (Signed)
Subjective:  Patient ID: Meredith Hayden, female    DOB: May 03, 1950  Age: 70 y.o. MRN: [id number]  CC: Hypertension, Hypothyroidism, and Diabetes  This visit occurred during the SARS-CoV-2 public health emergency.  Safety protocols were in place, including screening questions prior to the visit, additional usage of staff PPE, and extensive cleaning of exam room while observing appropriate contact time as indicated for disinfecting solutions.    HPI Meredith Hayden presents for f/up - She complains of weight gain.  She said she is not eating very healthfully and has not been exercising.  She denies polys.  She denies headache, blurred vision, chest pain, shortness of breath, edema, or fatigue.  Outpatient Medications Prior to Visit  Medication Sig Dispense Refill  . albuterol (PROAIR HFA) 108 (90 Base) MCG/ACT inhaler Inhale 2 puffs into the lungs every 6 (six) hours as needed. 18 g 5  . ALPRAZolam (XANAX) 1 MG tablet Take 1 mg by mouth 3 (three) times daily as needed for anxiety.    Marland Kitchen aspirin 81 MG tablet Take 81 mg by mouth daily.     . Blood Glucose Monitoring Suppl (CONTOUR NEXT ONE) KIT 1 Act by Does not apply route 2 (two) times a day. 2 kit 0  . cetirizine (ZYRTEC) 10 MG tablet Take 10 mg by mouth daily.     . Cholecalciferol (VITAMIN D3) 1000 UNITS CAPS Take 1 capsule by mouth daily.      . cyanocobalamin 100 MCG tablet Take 100 mcg by mouth daily.      Marland Kitchen dicyclomine (BENTYL) 10 MG capsule Take 1 capsule (10 mg total) by mouth 3 (three) times daily before meals. 90 capsule 3  . gabapentin (NEURONTIN) 100 MG capsule Take 600 mg by mouth 2 (two) times daily.     . Insulin Pen Needle (INSUPEN PEN NEEDLES) 33G X 4 MM MISC Inject 1 Act into the skin 4 (four) times daily - after meals and at bedtime. 200 each 11  . ipratropium (ATROVENT HFA) 17 MCG/ACT inhaler USE 2 PUFFS EVERY 4 HOURS  AS NEEDED FOR WHEEZING. 12.9 g 3  . Lancets (ONETOUCH DELICA PLUS [id number]) MISC USE AS  DIRECTED TO CHECK BLOOD SUGAR 4 TIMES DAILY AFTER MEALS AND AT BEDTIME. 200 each 4  . lisinopril (ZESTRIL) 20 MG tablet TAKE 1 TABLET ONCE DAILY. 90 tablet 0  . metoprolol tartrate (LOPRESSOR) 25 MG tablet TAKE 1 TABLET BY MOUTH  TWICE DAILY 180 tablet 0  . ONETOUCH VERIO test strip CHECK BLOOD SUGAR 4 TIMES A DAY. 200 strip 5  . rosuvastatin (CRESTOR) 20 MG tablet Take 1 tablet (20 mg total) by mouth daily. 90 tablet 3  . SURE COMFORT PEN NEEDLES 32G X 4 MM MISC USE WITH INSULIN FOR DOSING 4 TIMES DAILY-AFTER MEALS AND AT BEDTIME. 100 each 3  . SYNTHROID 75 MCG tablet TAKE 1 TABLET (75MCG TOTAL) DAILY BEFORE BREAKFAST 90 tablet 0  . TOUJEO SOLOSTAR 300 UNIT/ML Solostar Pen INJECT 60 UNITS INTO THE SKIN DAILY. 4.5 mL 0  . vortioxetine HBr (TRINTELLIX) 20 MG TABS tablet Take 20 mg by mouth daily.    Marland Kitchen HUMALOG KWIKPEN 100 UNIT/ML KwikPen INJECT 10 UNITS 3 TIMES A DAY. 15 mL 0   No facility-administered medications prior to visit.    ROS Review of Systems  Constitutional: Positive for unexpected weight change. Negative for appetite change, diaphoresis and fatigue.  HENT: Negative.   Eyes: Negative for visual disturbance.  Respiratory: Negative for cough, chest tightness,  shortness of breath and wheezing.   Cardiovascular: Negative for chest pain, palpitations and leg swelling.  Gastrointestinal: Negative for abdominal pain, constipation, diarrhea, nausea and vomiting.  Endocrine: Negative.  Negative for cold intolerance, heat intolerance, polydipsia, polyphagia and polyuria.  Genitourinary: Negative.  Negative for difficulty urinating.  Musculoskeletal: Negative for arthralgias and myalgias.  Skin: Negative.  Negative for color change.  Neurological: Negative.  Negative for dizziness, weakness, light-headedness and headaches.  Hematological: Negative for adenopathy. Does not bruise/bleed easily.  Psychiatric/Behavioral: Negative.     Objective:  BP 126/84   Pulse 68   Temp 99.3 F  (37.4 C) (Oral)   Resp 16   Ht '5\' 1"'  (1.549 m)   Wt 241 lb (109.3 kg)   LMP 12/31/1993   SpO2 96%   BMI 45.54 kg/m   BP Readings from Last 3 Encounters:  10/13/20 126/84  05/23/20 112/68  02/10/20 118/62    Wt Readings from Last 3 Encounters:  10/13/20 241 lb (109.3 kg)  05/23/20 228 lb (103.4 kg)  02/10/20 232 lb (105.2 kg)    Physical Exam Vitals reviewed.  Constitutional:      Appearance: She is obese.  HENT:     Nose: Nose normal.     Mouth/Throat:     Mouth: Mucous membranes are moist.  Eyes:     General: No scleral icterus.    Conjunctiva/sclera: Conjunctivae normal.  Cardiovascular:     Rate and Rhythm: Normal rate and regular rhythm.     Heart sounds: No murmur heard.   Pulmonary:     Effort: Pulmonary effort is normal.     Breath sounds: No stridor. No wheezing, rhonchi or rales.  Abdominal:     General: Abdomen is protuberant. Bowel sounds are normal. There is no distension.     Palpations: Abdomen is soft. There is no hepatomegaly, splenomegaly or mass.     Tenderness: There is no abdominal tenderness.  Musculoskeletal:        General: Normal range of motion.     Cervical back: Neck supple.     Right lower leg: No edema.     Left lower leg: No edema.  Lymphadenopathy:     Cervical: No cervical adenopathy.  Skin:    General: Skin is warm and dry.  Neurological:     General: No focal deficit present.     Mental Status: She is alert. Mental status is at baseline.  Psychiatric:        Mood and Affect: Mood normal.        Behavior: Behavior normal.        Thought Content: Thought content normal.        Judgment: Judgment normal.     Lab Results  Component Value Date   WBC 10.5 10/13/2020   HGB 12.9 10/13/2020   HCT 38.9 10/13/2020   PLT 223.0 10/13/2020   GLUCOSE 133 (H) 10/13/2020   CHOL 138 02/10/2020   TRIG 158.0 (H) 02/10/2020   HDL 47.10 02/10/2020   LDLDIRECT 69.0 01/07/2019   LDLCALC 60 02/10/2020   ALT 9 02/10/2020   AST 15  02/10/2020   NA 139 10/13/2020   K 4.2 10/13/2020   CL 103 10/13/2020   CREATININE 1.39 (H) 10/13/2020   BUN 21 10/13/2020   CO2 26 10/13/2020   TSH 1.24 10/13/2020   INR 1.0 02/10/2020   HGBA1C 7.0 (H) 10/13/2020   MICROALBUR <0.7 02/10/2020    CT Abdomen Pelvis W Contrast  Result Date: 05/25/2020  CLINICAL DATA:  decreased appetite 10 pound weight loss abdominal tenderness and leukocytosis EXAM: CT ABDOMEN AND PELVIS WITH CONTRAST TECHNIQUE: Multidetector CT imaging of the abdomen and pelvis was performed using the standard protocol following bolus administration of intravenous contrast. CONTRAST:  3m OMNIPAQUE IOHEXOL 300 MG/ML  SOLN COMPARISON:  None. FINDINGS: Lower chest: The visualized heart size within normal limits. No pericardial fluid/thickening. No hiatal hernia. The visualized portions of the lungs are clear. Hepatobiliary: There is diffuse low density seen throughout the liver parenchyma. Main portal vein is patent. No evidence of calcified gallstones, gallbladder wall thickening or biliary dilatation. Pancreas: Unremarkable. No pancreatic ductal dilatation or surrounding inflammatory changes. Spleen: Normal in size without focal abnormality. Adrenals/Urinary Tract: Both adrenal glands appear normal. The kidneys and collecting system appear normal without evidence of urinary tract calculus or hydronephrosis. Bladder is unremarkable. Stomach/Bowel: Stomach and small bowel are normal in appearance. There is a moderate to large amount of colonic stool. The patient is status post partial colectomy with anastomosis at the sigmoid colon. Vascular/Lymphatic: There are no enlarged mesenteric, retroperitoneal, or pelvic lymph nodes. Scattered aortic atherosclerotic calcifications are seen without aneurysmal dilatation. Reproductive: The patient is status post hysterectomy. Other: A small fat containing upper abdominal wall hernia is noted. Musculoskeletal: No acute or significant osseous  findings. IMPRESSION: No acute intra-abdominal or pelvic pathology to explain the patient's symptoms. Aortic Atherosclerosis (ICD10-I70.0). Electronically Signed   By: BPrudencio PairM.D.   On: 05/25/2020 14:45    Assessment & Plan:   Meredith Hayden seen today for hypertension, hypothyroidism and diabetes.  Diagnoses and all orders for this visit:  Flu vaccine need -     Flu Vaccine QUAD High Dose(Fluad)  Diabetes mellitus with renal manifestations, uncontrolled ([place]- Her A1c is at 7.0%.  Will continue the current basal/bolus insulin regimen.  She agrees to improve her lifestyle modifications. -     Glucagon (GVOKE HYPOPEN 2-PACK) 1 MG/0.2ML SOAJ; Inject 1 Act into the skin daily as needed. -     insulin lispro (HUMALOG KWIKPEN) 100 UNIT/ML KwikPen; Inject 16 Units into the skin 3 (three) times daily. -     BASIC METABOLIC PANEL WITH GFR; Future -     Hemoglobin A1c; Future -     Basic Metabolic Panel (BMET) -     Hemoglobin A1c  Essential hypertension- Her blood pressure is adequately well controlled.  Electrolytes are normal.  Renal function is stable. -     CBC with Differential/Platelet; Future -     BASIC METABOLIC PANEL WITH GFR; Future -     Urinalysis, Routine w reflex microscopic; Future -     Basic Metabolic Panel (BMET) -     Urinalysis, Routine w reflex microscopic -     CBC with Differential/Platelet  Acquired hypothyroidism- Her TSH is in the normal range.  She will stay on the current dose of levothyroxine. -     TSH; Future -     TSH  Stage 3a chronic kidney disease ([place]- Her renal function is stable.  She will avoid nephrotoxic agents.  Will continue to main control of her blood pressure and blood sugar. -     Urinalysis, Routine w reflex microscopic; Future -     Urinalysis, Routine w reflex microscopic  Visit for screening mammogram -     MM DIGITAL SCREENING BILATERAL; Future   I have changed Meredith Hayden"Chris"'s HumaLOG KwikPen to insulin lispro. I am also  having her start on Gvoke HypoPen  2-Pack. Additionally, I am having her maintain her aspirin, cetirizine, cyanocobalamin, Vitamin D3, Insulin Pen Needle, ALPRAZolam, gabapentin, vortioxetine HBr, ipratropium, albuterol, Contour Next One, rosuvastatin, Sure Comfort Pen Needles, dicyclomine, OneTouch Delica Plus [id number], Synthroid, metoprolol tartrate, OneTouch Verio, lisinopril, and Toujeo SoloStar.  Meds ordered this encounter  Medications  . Glucagon (GVOKE HYPOPEN 2-PACK) 1 MG/0.2ML SOAJ    Sig: Inject 1 Act into the skin daily as needed.    Dispense:  2 mL    Refill:  5  . insulin lispro (HUMALOG KWIKPEN) 100 UNIT/ML KwikPen    Sig: Inject 16 Units into the skin 3 (three) times daily.    Dispense:  15 mL    Refill:  1     Follow-up: Return in about 4 months (around 02/13/2021).  Scarlette Calico, MD

## 2020-10-13 NOTE — Patient Instructions (Signed)
Type 2 Diabetes Mellitus, Diagnosis, Adult Type 2 diabetes (type 2 diabetes mellitus) is a long-term (chronic) disease. In type 2 diabetes, one or both of these problems may be present:  The pancreas does not make enough of a hormone called insulin.  Cells in the body do not respond properly to insulin that the body makes (insulin resistance). Normally, insulin allows blood sugar (glucose) to enter cells in the body. The cells use glucose for energy. Insulin resistance or lack of insulin causes excess glucose to build up in the blood instead of going into cells. As a result, high blood glucose (hyperglycemia) develops. What increases the risk? The following factors may make you more likely to develop type 2 diabetes:  Having a family member with type 2 diabetes.  Being overweight or obese.  Having an inactive (sedentary) lifestyle.  Having been diagnosed with insulin resistance.  Having a history of prediabetes, gestational diabetes, or polycystic ovary syndrome (PCOS).  Being of American-Indian, African-American, Hispanic/Latino, or Asian/Pacific Islander descent. What are the signs or symptoms? In the early stage of this condition, you may not have symptoms. Symptoms develop slowly and may include:  Increased thirst (polydipsia).  Increased hunger(polyphagia).  Increased urination (polyuria).  Increased urination during the night (nocturia).  Unexplained weight loss.  Frequent infections that keep coming back (recurring).  Fatigue.  Weakness.  Vision changes, such as blurry vision.  Cuts or bruises that are slow to heal.  Tingling or numbness in the hands or feet.  Dark patches on the skin (acanthosis nigricans). How is this diagnosed? This condition is diagnosed based on your symptoms, your medical history, a physical exam, and your blood glucose level. Your blood glucose may be checked with one or more of the following blood tests:  A fasting blood glucose (FBG)  test. You will not be allowed to eat (you will fast) for 8 hours or longer before a blood sample is taken.  A random blood glucose test. This test checks blood glucose at any time of day regardless of when you ate.  An A1c (hemoglobin A1c) blood test. This test provides information about blood glucose control over the previous 2-3 months.  An oral glucose tolerance test (OGTT). This test measures your blood glucose at two times: ? After fasting. This is your baseline blood glucose level. ? Two hours after drinking a beverage that contains glucose. You may be diagnosed with type 2 diabetes if:  Your FBG level is 126 mg/dL (7.0 mmol/L) or higher.  Your random blood glucose level is 200 mg/dL (11.1 mmol/L) or higher.  Your A1c level is 6.5% or higher.  Your OGTT result is higher than 200 mg/dL (11.1 mmol/L). These blood tests may be repeated to confirm your diagnosis. How is this treated? Your treatment may be managed by a specialist called an endocrinologist. Type 2 diabetes may be treated by following instructions from your health care provider about:  Making diet and lifestyle changes. This may include: ? Following an individualized nutrition plan that is developed by a diet and nutrition specialist (registered dietitian). ? Exercising regularly. ? Finding ways to manage stress.  Checking your blood glucose level as often as told.  Taking diabetes medicines or insulin daily. This helps to keep your blood glucose levels in the healthy range. ? If you use insulin, you may need to adjust the dosage depending on how physically active you are and what foods you eat. Your health care provider will tell you how to adjust your dosage.    Taking medicines to help prevent complications from diabetes, such as: ? Aspirin. ? Medicine to lower cholesterol. ? Medicine to control blood pressure. Your health care provider will set individualized treatment goals for you. Your goals will be based on  your age, other medical conditions you have, and how you respond to diabetes treatment. Generally, the goal of treatment is to maintain the following blood glucose levels:  Before meals (preprandial): 80-130 mg/dL (4.4-7.2 mmol/L).  After meals (postprandial): below 180 mg/dL (10 mmol/L).  A1c level: less than 7%. Follow these instructions at home: Questions to ask your health care provider  Consider asking the following questions: ? Do I need to meet with a diabetes educator? ? Where can I find a support group for people with diabetes? ? What equipment will I need to manage my diabetes at home? ? What diabetes medicines do I need, and when should I take them? ? How often do I need to check my blood glucose? ? What number can I call if I have questions? ? When is my next appointment? General instructions  Take over-the-counter and prescription medicines only as told by your health care provider.  Keep all follow-up visits as told by your health care provider. This is important.  For more information about diabetes, visit: ? American Diabetes Association (ADA): www.diabetes.org ? American Association of Diabetes Educators (AADE): www.diabeteseducator.org Contact a health care provider if:  Your blood glucose is at or above 240 mg/dL (13.3 mmol/L) for 2 days in a row.  You have been sick or have had a fever for 2 days or longer, and you are not getting better.  You have any of the following problems for more than 6 hours: ? You cannot eat or drink. ? You have nausea and vomiting. ? You have diarrhea. Get help right away if:  Your blood glucose is lower than 54 mg/dL (3.0 mmol/L).  You become confused or you have trouble thinking clearly.  You have difficulty breathing.  You have moderate or large ketone levels in your urine. Summary  Type 2 diabetes (type 2 diabetes mellitus) is a long-term (chronic) disease. In type 2 diabetes, the pancreas does not make enough of a  hormone called insulin, or cells in the body do not respond properly to insulin that the body makes (insulin resistance).  This condition is treated by making diet and lifestyle changes and taking diabetes medicines or insulin.  Your health care provider will set individualized treatment goals for you. Your goals will be based on your age, other medical conditions you have, and how you respond to diabetes treatment.  Keep all follow-up visits as told by your health care provider. This is important. This information is not intended to replace advice given to you by your health care provider. Make sure you discuss any questions you have with your health care provider. Document Revised: 02/14/2018 Document Reviewed: 01/20/2016 Elsevier Patient Education  2020 [employer]  

## 2020-10-14 ENCOUNTER — Encounter: Payer: Self-pay | Admitting: Internal Medicine

## 2020-10-14 LAB — BASIC METABOLIC PANEL
BUN: 21 mg/dL (ref 6–23)
CO2: 26 mEq/L (ref 19–32)
Calcium: 8.8 mg/dL (ref 8.4–10.5)
Chloride: 103 mEq/L (ref 96–112)
Creatinine, Ser: 1.39 mg/dL — ABNORMAL HIGH (ref 0.40–1.20)
GFR: 38.83 mL/min — ABNORMAL LOW (ref >60.00)
Glucose, Bld: 133 mg/dL — ABNORMAL HIGH (ref 70–99)
Potassium: 4.2 mEq/L (ref 3.5–5.1)
Sodium: 139 mEq/L (ref 135–145)

## 2020-10-17 ENCOUNTER — Other Ambulatory Visit: Payer: Self-pay | Admitting: Internal Medicine

## 2020-10-17 DIAGNOSIS — I10 Essential (primary) hypertension: Secondary | ICD-10-CM

## 2020-10-31 ENCOUNTER — Ambulatory Visit: Payer: Medicare Other

## 2020-10-31 ENCOUNTER — Other Ambulatory Visit: Payer: Self-pay | Admitting: Internal Medicine

## 2020-11-05 ENCOUNTER — Other Ambulatory Visit: Payer: Self-pay | Admitting: Internal Medicine

## 2020-11-05 DIAGNOSIS — E039 Hypothyroidism, unspecified: Secondary | ICD-10-CM

## 2020-11-14 ENCOUNTER — Other Ambulatory Visit: Payer: Self-pay | Admitting: Internal Medicine

## 2020-11-14 DIAGNOSIS — E1165 Type 2 diabetes mellitus with hyperglycemia: Secondary | ICD-10-CM

## 2020-11-14 DIAGNOSIS — IMO0002 Reserved for concepts with insufficient information to code with codable children: Secondary | ICD-10-CM

## 2020-11-14 DIAGNOSIS — E11319 Type 2 diabetes mellitus with unspecified diabetic retinopathy without macular edema: Secondary | ICD-10-CM

## 2020-12-14 ENCOUNTER — Other Ambulatory Visit: Payer: Self-pay | Admitting: Internal Medicine

## 2020-12-14 DIAGNOSIS — E11319 Type 2 diabetes mellitus with unspecified diabetic retinopathy without macular edema: Secondary | ICD-10-CM

## 2020-12-14 DIAGNOSIS — IMO0002 Reserved for concepts with insufficient information to code with codable children: Secondary | ICD-10-CM

## 2020-12-19 ENCOUNTER — Other Ambulatory Visit: Payer: Self-pay | Admitting: Internal Medicine

## 2020-12-19 DIAGNOSIS — E11319 Type 2 diabetes mellitus with unspecified diabetic retinopathy without macular edema: Secondary | ICD-10-CM

## 2020-12-19 DIAGNOSIS — IMO0002 Reserved for concepts with insufficient information to code with codable children: Secondary | ICD-10-CM

## 2020-12-19 DIAGNOSIS — I10 Essential (primary) hypertension: Secondary | ICD-10-CM

## 2021-01-01 IMAGING — CT CT ABD-PELV W/ CM
2 of 5 series · 16 of 46 positions shown, 18 images · IV contrast (omnipaque)
Comparison: None.

CLINICAL DATA: decreased appetite 10 pound weight loss abdominal
tenderness and leukocytosis

EXAM:
CT ABDOMEN AND PELVIS WITH CONTRAST
TECHNIQUE: Multidetector CT imaging of the abdomen and pelvis was performed
using the standard protocol following bolus administration of
intravenous contrast.
CONTRAST:  80mL OMNIPAQUE IOHEXOL 300 MG/ML  SOLN

[Series 2: abd/pel w · axial · 0.80mm/px · z∈[-482,-82]mm · 13 of 90 slices shown, 15 images]
[im 5/90  soft-tissue]
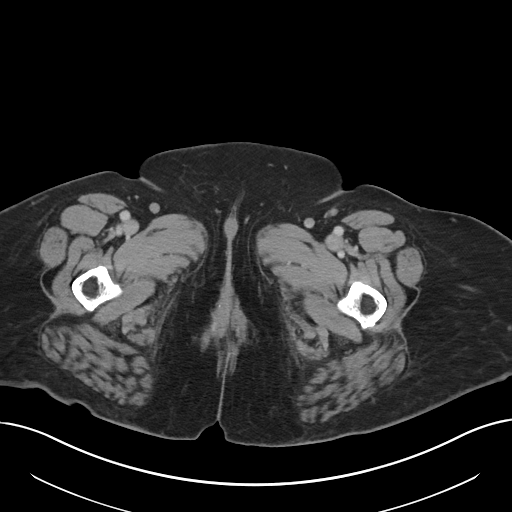
[im 5/90  bone]
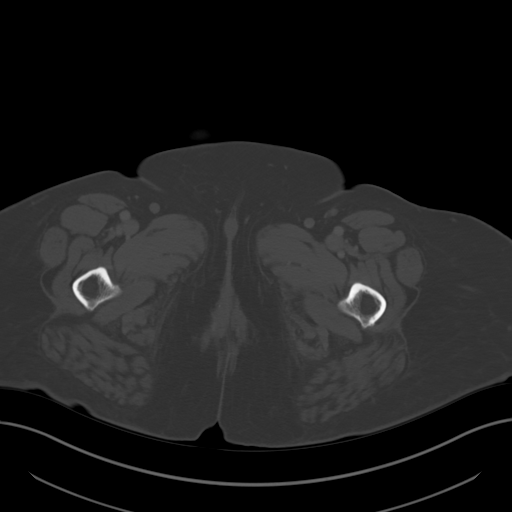
[im 15/90  soft-tissue]
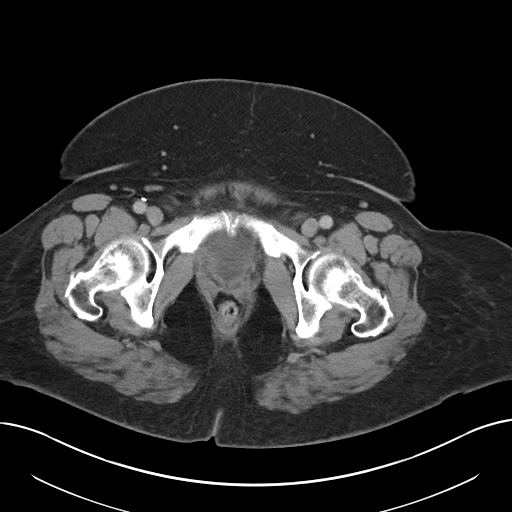
[im 19/90  soft-tissue]
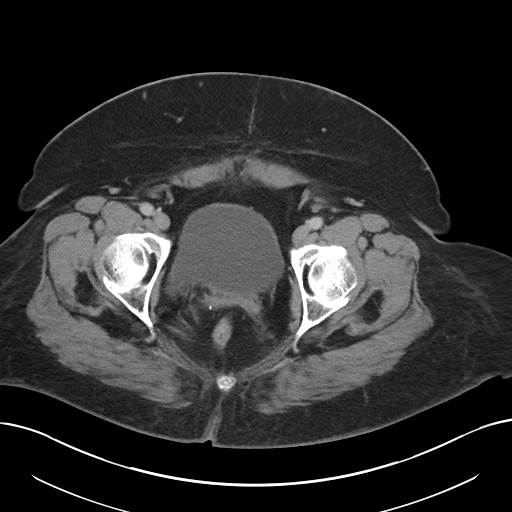
[im 24/90  soft-tissue]
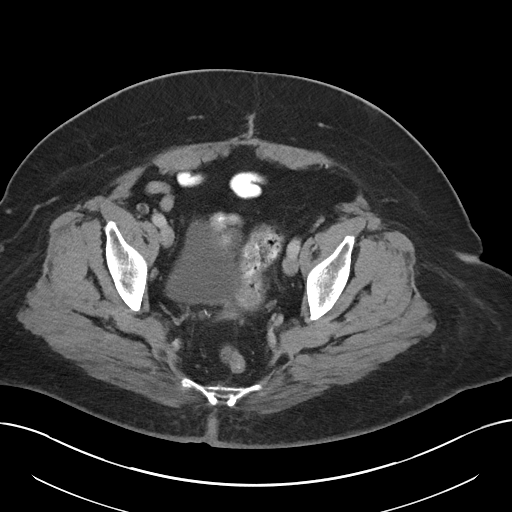
[im 33/90  soft-tissue]
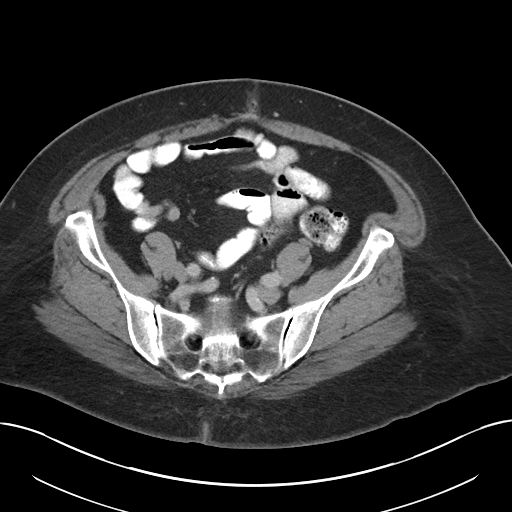
[im 38/90  soft-tissue]
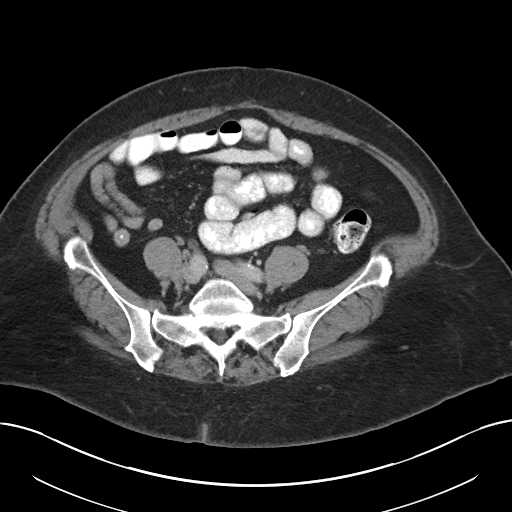
[im 47/90  soft-tissue]
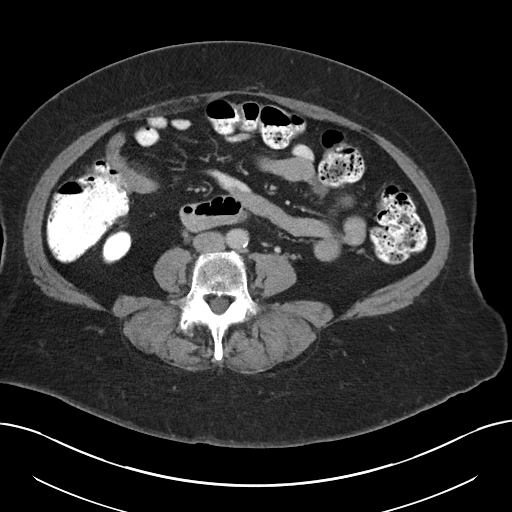
[im 52/90  soft-tissue]
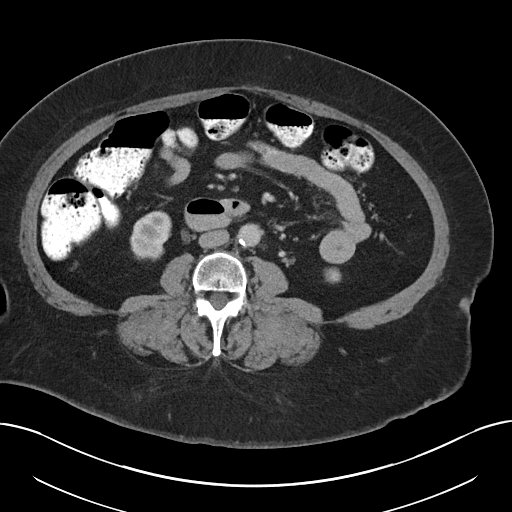
[im 57/90  soft-tissue]
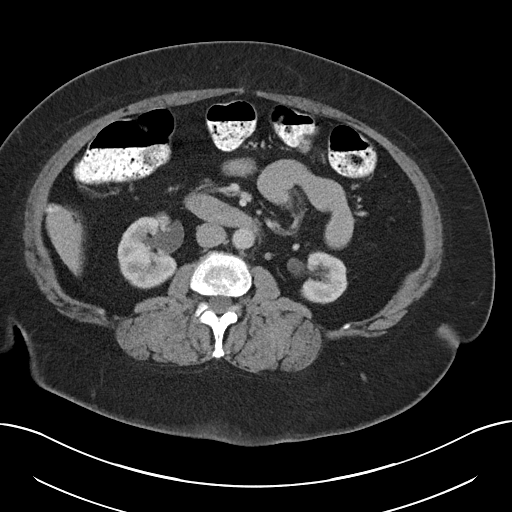
[im 57/90  bone]
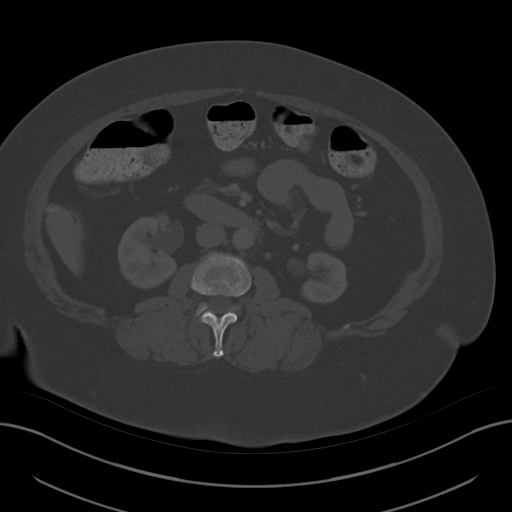
[im 66/90  soft-tissue]
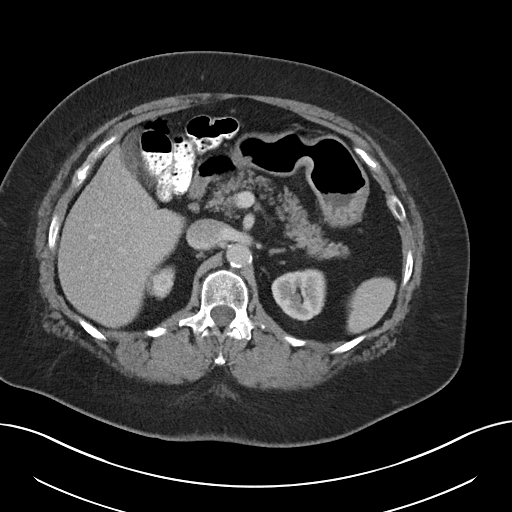
[im 71/90  soft-tissue]
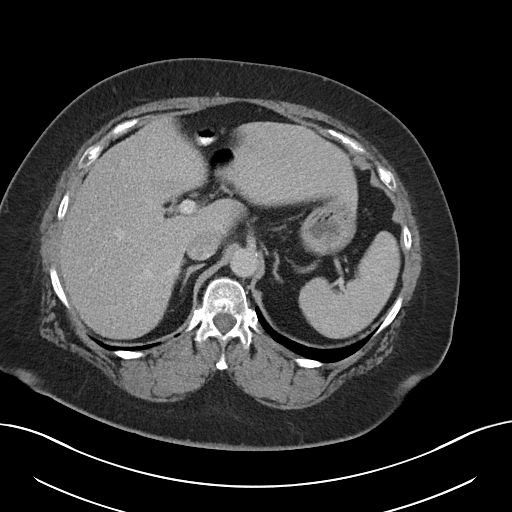
[im 75/90  soft-tissue]
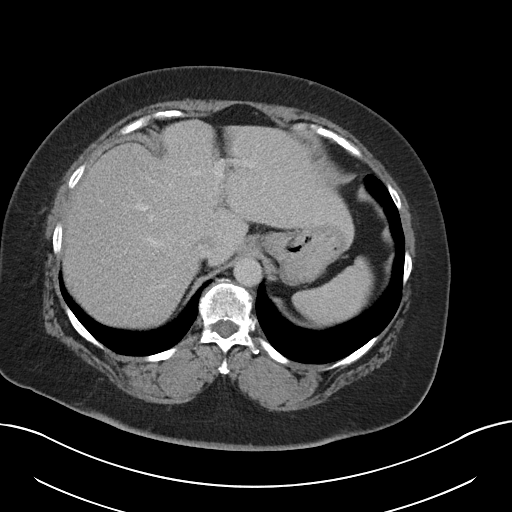
[im 85/90  soft-tissue]
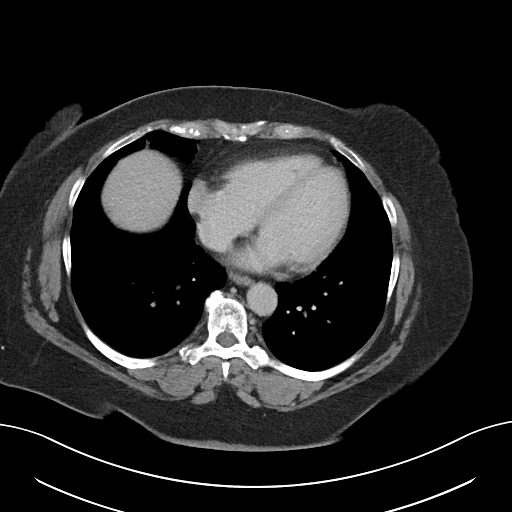

[Series 5: abd/pel w st · coronal · 0.73mm/px · 3 of 89 slices shown]
[im 30/89  soft-tissue]
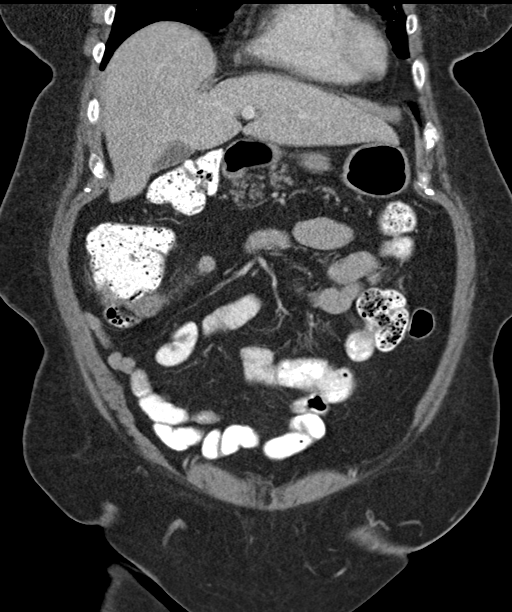
[im 40/89  soft-tissue]
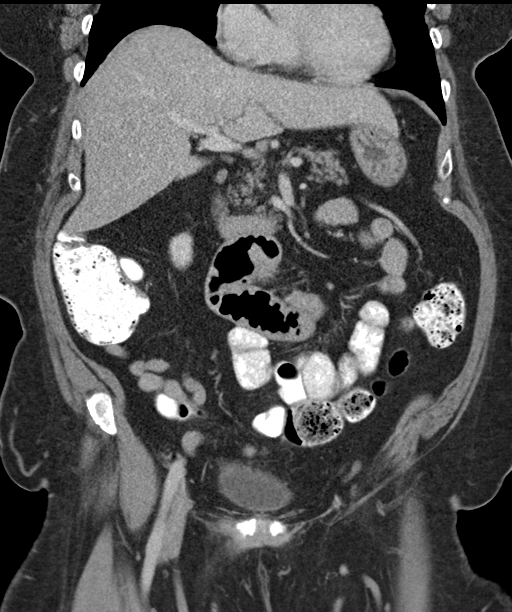
[im 49/89  soft-tissue]
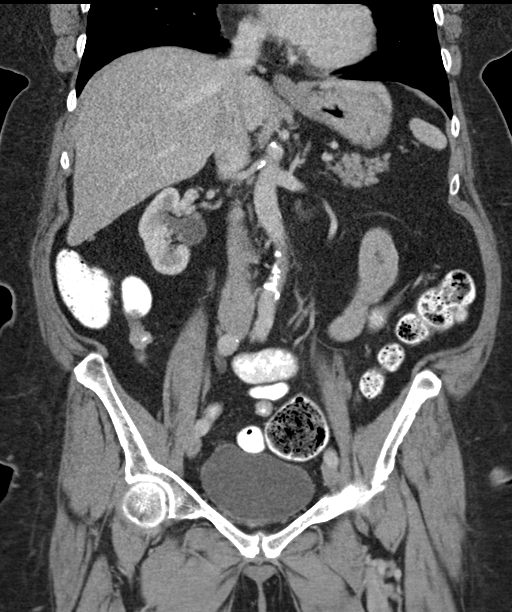

[16 of 46 positions shown; findings below may reference images not displayed]

FINDINGS: Lower chest: The visualized heart size within normal limits. No
pericardial fluid/thickening.

No hiatal hernia.

The visualized portions of the lungs are clear.

Hepatobiliary: There is diffuse low density seen throughout the
liver parenchyma. Main portal vein is patent. No evidence of
calcified gallstones, gallbladder wall thickening or biliary
dilatation.

Pancreas: Unremarkable. No pancreatic ductal dilatation or
surrounding inflammatory changes.

Spleen: Normal in size without focal abnormality.

Adrenals/Urinary Tract: Both adrenal glands appear normal. The
kidneys and collecting system appear normal without evidence of
urinary tract calculus or hydronephrosis. Bladder is unremarkable.

Stomach/Bowel: Stomach and small bowel are normal in appearance.
There is a moderate to large amount of colonic stool. The patient is
status post partial colectomy with anastomosis at the sigmoid colon.

Vascular/Lymphatic: There are no enlarged mesenteric,
retroperitoneal, or pelvic lymph nodes. Scattered aortic
atherosclerotic calcifications are seen without aneurysmal
dilatation.

Reproductive: The patient is status post hysterectomy.

Other: A small fat containing upper abdominal wall hernia is noted.

Musculoskeletal: No acute or significant osseous findings.
IMPRESSION: No acute intra-abdominal or pelvic pathology to explain the
patient's symptoms.

Aortic Atherosclerosis ([id number]-XR4.4).

## 2021-01-02 ENCOUNTER — Encounter: Payer: Self-pay | Admitting: Internal Medicine

## 2021-01-02 ENCOUNTER — Ambulatory Visit (INDEPENDENT_AMBULATORY_CARE_PROVIDER_SITE_OTHER): Payer: Medicare Other

## 2021-01-02 DIAGNOSIS — Z0000 Encounter for general adult medical examination without abnormal findings: Secondary | ICD-10-CM

## 2021-01-02 NOTE — Patient Instructions (Signed)
Meredith Hayden , Thank you for taking time to come for your Medicare Wellness Visit. I appreciate your ongoing commitment to your health goals. Please review the following plan we discussed and let me know if I can assist you in the future.   Screening recommendations/referrals: Colonoscopy: 11/14/2016; due every 10 years Mammogram: 06/04/2018 Bone Density: 03/09/2015; due every 2-3 years Recommended yearly ophthalmology/optometry visit for glaucoma screening and checkup Recommended yearly dental visit for hygiene and checkup  Vaccinations: Influenza vaccine: 10/13/2020 Pneumococcal vaccine: up to date Tdap vaccine: 05/19/2018; due every 10 years Shingles vaccine: no documentation Covid-19: declined  Advanced directives: Please bring a copy of your health care power of attorney and living will to the office at your convenience.  Conditions/risks identified: Yes; Reviewed health maintenance screenings with patient today and relevant education, vaccines, and/or referrals were provided. Please continue to do your personal lifestyle choices by: daily care of teeth and gums, regular physical activity (goal should be 5 days a week for 30 minutes), eat a healthy diet, avoid tobacco and drug use, limiting any alcohol intake, taking a low-dose aspirin (if not allergic or have been advised by your provider otherwise) and taking vitamins and minerals as recommended by your provider. Continue doing brain stimulating activities (puzzles, reading, adult coloring books, staying active) to keep memory sharp. Continue to eat heart healthy diet (full of fruits, vegetables, whole grains, lean protein, water--limit salt, fat, and sugar intake) and increase physical activity as tolerated.  Next appointment: Please schedule your next Medicare Wellness Visit with your Nurse Health Advisor in 1 year by calling [phone].  Preventive Care 72 Years and Older, Female Preventive care refers to lifestyle choices and visits  with your health care provider that can promote health and wellness. What does preventive care include?  A yearly physical exam. This is also called an annual well check.  Dental exams once or twice a year.  Routine eye exams. Ask your health care provider how often you should have your eyes checked.  Personal lifestyle choices, including:  Daily care of your teeth and gums.  Regular physical activity.  Eating a healthy diet.  Avoiding tobacco and drug use.  Limiting alcohol use.  Practicing safe sex.  Taking low-dose aspirin every day.  Taking vitamin and mineral supplements as recommended by your health care provider. What happens during an annual well check? The services and screenings done by your health care provider during your annual well check will depend on your age, overall health, lifestyle risk factors, and family history of disease. Counseling  Your health care provider may ask you questions about your:  Alcohol use.  Tobacco use.  Drug use.  Emotional well-being.  Home and relationship well-being.  Sexual activity.  Eating habits.  History of falls.  Memory and ability to understand (cognition).  Work and work Statistician.  Reproductive health. Screening  You may have the following tests or measurements:  Height, weight, and BMI.  Blood pressure.  Lipid and cholesterol levels. These may be checked every 5 years, or more frequently if you are over 19 years old.  Skin check.  Lung cancer screening. You may have this screening every year starting at age 69 if you have a 30-pack-year history of smoking and currently smoke or have quit within the past 15 years.  Fecal occult blood test (FOBT) of the stool. You may have this test every year starting at age 5.  Flexible sigmoidoscopy or colonoscopy. You may have a sigmoidoscopy every 5 years  or a colonoscopy every 10 years starting at age 86.  Hepatitis C blood test.  Hepatitis B blood  test.  Sexually transmitted disease (STD) testing.  Diabetes screening. This is done by checking your blood sugar (glucose) after you have not eaten for a while (fasting). You may have this done every 1-3 years.  Bone density scan. This is done to screen for osteoporosis. You may have this done starting at age 34.  Mammogram. This may be done every 1-2 years. Talk to your health care provider about how often you should have regular mammograms. Talk with your health care provider about your test results, treatment options, and if necessary, the need for more tests. Vaccines  Your health care provider may recommend certain vaccines, such as:  Influenza vaccine. This is recommended every year.  Tetanus, diphtheria, and acellular pertussis (Tdap, Td) vaccine. You may need a Td booster every 10 years.  Zoster vaccine. You may need this after age 51.  Pneumococcal 13-valent conjugate (PCV13) vaccine. One dose is recommended after age 82.  Pneumococcal polysaccharide (PPSV23) vaccine. One dose is recommended after age 68. Talk to your health care provider about which screenings and vaccines you need and how often you need them. This information is not intended to replace advice given to you by your health care provider. Make sure you discuss any questions you have with your health care provider. Document Released: 01/13/2016 Document Revised: 09/05/2016 Document Reviewed: 10/18/2015 Elsevier Interactive Patient Education  2017 [employer].  Fall Prevention in the Home Falls can cause injuries. They can happen to people of all ages. There are many things you can do to make your home safe and to help prevent falls. What can I do on the outside of my home?  Regularly fix the edges of walkways and driveways and fix any cracks.  Remove anything that might make you trip as you walk through a door, such as a raised step or threshold.  Trim any bushes or trees on the path to your home.  Use  bright outdoor lighting.  Clear any walking paths of anything that might make someone trip, such as rocks or tools.  Regularly check to see if handrails are loose or broken. Make sure that both sides of any steps have handrails.  Any raised decks and porches should have guardrails on the edges.  Have any leaves, snow, or ice cleared regularly.  Use sand or salt on walking paths during winter.  Clean up any spills in your garage right away. This includes oil or grease spills. What can I do in the bathroom?  Use night lights.  Install grab bars by the toilet and in the tub and shower. Do not use towel bars as grab bars.  Use non-skid mats or decals in the tub or shower.  If you need to sit down in the shower, use a plastic, non-slip stool.  Keep the floor dry. Clean up any water that spills on the floor as soon as it happens.  Remove soap buildup in the tub or shower regularly.  Attach bath mats securely with double-sided non-slip rug tape.  Do not have throw rugs and other things on the floor that can make you trip. What can I do in the bedroom?  Use night lights.  Make sure that you have a light by your bed that is easy to reach.  Do not use any sheets or blankets that are too big for your bed. They should not hang down  onto the floor.  Have a firm chair that has side arms. You can use this for support while you get dressed.  Do not have throw rugs and other things on the floor that can make you trip. What can I do in the kitchen?  Clean up any spills right away.  Avoid walking on wet floors.  Keep items that you use a lot in easy-to-reach places.  If you need to reach something above you, use a strong step stool that has a grab bar.  Keep electrical cords out of the way.  Do not use floor polish or wax that makes floors slippery. If you must use wax, use non-skid floor wax.  Do not have throw rugs and other things on the floor that can make you trip. What can  I do with my stairs?  Do not leave any items on the stairs.  Make sure that there are handrails on both sides of the stairs and use them. Fix handrails that are broken or loose. Make sure that handrails are as long as the stairways.  Check any carpeting to make sure that it is firmly attached to the stairs. Fix any carpet that is loose or worn.  Avoid having throw rugs at the top or bottom of the stairs. If you do have throw rugs, attach them to the floor with carpet tape.  Make sure that you have a light switch at the top of the stairs and the bottom of the stairs. If you do not have them, ask someone to add them for you. What else can I do to help prevent falls?  Wear shoes that:  Do not have high heels.  Have rubber bottoms.  Are comfortable and fit you well.  Are closed at the toe. Do not wear sandals.  If you use a stepladder:  Make sure that it is fully opened. Do not climb a closed stepladder.  Make sure that both sides of the stepladder are locked into place.  Ask someone to hold it for you, if possible.  Clearly mark and make sure that you can see:  Any grab bars or handrails.  First and last steps.  Where the edge of each step is.  Use tools that help you move around (mobility aids) if they are needed. These include:  Canes.  Walkers.  Scooters.  Crutches.  Turn on the lights when you go into a dark area. Replace any light bulbs as soon as they burn out.  Set up your furniture so you have a clear path. Avoid moving your furniture around.  If any of your floors are uneven, fix them.  If there are any pets around you, be aware of where they are.  Review your medicines with your doctor. Some medicines can make you feel dizzy. This can increase your chance of falling. Ask your doctor what other things that you can do to help prevent falls. This information is not intended to replace advice given to you by your health care provider. Make sure you  discuss any questions you have with your health care provider. Document Released: 10/13/2009 Document Revised: 05/24/2016 Document Reviewed: 01/21/2015 Elsevier Interactive Patient Education  2017 [employer].

## 2021-01-02 NOTE — Progress Notes (Signed)
I connected with Ruthe Roemer today by telephone and verified that I am speaking with the correct person using two identifiers. Location patient: home Location provider: work Persons participating in the virtual visit: Alisa Stjames and Lisette Abu, LPN.  I discussed the limitations, risks, security and privacy concerns of performing an evaluation and management service by telephone and the availability of in person appointments. I also discussed with the patient that there may be a patient responsible charge related to this service. The patient expressed understanding and verbally consented to this telephonic visit.    Interactive audio and video telecommunications were attempted between this provider and patient, however failed, due to patient having technical difficulties OR patient did not have access to video capability.  We continued and completed visit with audio only.  Some vital signs may be absent or patient reported.   Time Spent with patient on telephone encounter: 30 minutes  Subjective:   Meredith Hayden is a 71 y.o. female who presents for Medicare Annual (Subsequent) preventive examination.  Review of Systems    No ROS. Medicare Wellness Visit. Additional risk factors are reflected in social history. Cardiac Risk Factors include: advanced age (>83mn, >>63women);diabetes mellitus;dyslipidemia;hypertension;family history of premature cardiovascular disease;obesity (BMI >30kg/m2)     Objective:    There were no vitals filed for this visit. There is no height or weight on file to calculate BMI.  Advanced Directives 01/02/2021 01/07/2019 01/01/2018 11/14/2016 01/25/2015  Does Patient Have a Medical Advance Directive? _0   Does patient want to make changes to medical advance directive? - - Yes (ED - Information included in AVS) - -  Would patient like information on creating a medical advance directive? No - Patient declined No - Patient declined - - No -  patient declined information    Current Medications (verified) Outpatient Encounter Medications as of 01/02/2021  Medication Sig  . albuterol (PROAIR HFA) 108 (90 Base) MCG/ACT inhaler Inhale 2 puffs into the lungs every 6 (six) hours as needed.  . ALPRAZolam (XANAX) 1 MG tablet Take 1 mg by mouth 3 (three) times daily as needed for anxiety.  .Marland Kitchenaspirin 81 MG tablet Take 81 mg by mouth daily.   . Blood Glucose Monitoring Suppl (CONTOUR NEXT ONE) KIT 1 Act by Does not apply route 2 (two) times a day.  . cetirizine (ZYRTEC) 10 MG tablet Take 10 mg by mouth daily.   . Cholecalciferol (VITAMIN D3) 1000 UNITS CAPS Take 1 capsule by mouth daily.    . cyanocobalamin 100 MCG tablet Take 100 mcg by mouth daily.    .Marland Kitchendicyclomine (BENTYL) 10 MG capsule Take 1 capsule (10 mg total) by mouth 3 (three) times daily before meals.  . gabapentin (NEURONTIN) 100 MG capsule Take 600 mg by mouth 2 (two) times daily.   . Glucagon (GVOKE HYPOPEN 2-PACK) 1 MG/0.2ML SOAJ Inject 1 Act into the skin daily as needed.  . insulin lispro (HUMALOG KWIKPEN) 100 UNIT/ML KwikPen Inject 16 Units into the skin 3 (three) times daily.  . Insulin Pen Needle (INSUPEN PEN NEEDLES) 33G X 4 MM MISC Inject 1 Act into the skin 4 (four) times daily - after meals and at bedtime.  .Marland Kitchenipratropium (ATROVENT HFA) 17 MCG/ACT inhaler USE 2 PUFFS EVERY 4 HOURS  AS NEEDED FOR WHEEZING.  . Lancets (ONETOUCH DELICA PLUS [id number] MISC USE AS DIRECTED TO CHECK BLOOD SUGAR 4 TIMES DAILY AFTER MEALS AND AT BEDTIME.  .Marland Kitchenlisinopril (ZESTRIL) 20 MG tablet  TAKE 1 TABLET ONCE DAILY.  . metoprolol tartrate (LOPRESSOR) 25 MG tablet TAKE 1 TABLET BY MOUTH  TWICE DAILY  . ONETOUCH VERIO test strip CHECK BLOOD SUGAR 4 TIMES A DAY.  . rosuvastatin (CRESTOR) 20 MG tablet Take 1 tablet (20 mg total) by mouth daily.  . SURE COMFORT PEN NEEDLES 32G X 4 MM MISC USE WITH INSULIN FOR DOSING 4 TIMES DAILY-AFTER MEALS AND AT BEDTIME.  . SYNTHROID 75 MCG tablet TAKE 1 TABLET  DAILY BEFORE BREAKFAST FOR HYPOTHYROIDISM  . TOUJEO SOLOSTAR 300 UNIT/ML Solostar Pen INJECT 60 UNITS INTO THE SKIN DAILY.  Marland Kitchen vortioxetine HBr (TRINTELLIX) 20 MG TABS tablet Take 20 mg by mouth daily.  . [DISCONTINUED] azelastine (ASTELIN) 137 MCG/SPRAY nasal spray 1 spray by Nasal route 2 (two) times daily. Use in each nostril as directed   . [DISCONTINUED] budesonide-formoterol (SYMBICORT) 160-4.5 MCG/ACT inhaler Inhale 2 puffs into the lungs 2 (two) times daily.    . [DISCONTINUED] diphenhydrAMINE (SOMINEX) 25 MG tablet Take 25 mg by mouth at bedtime as needed.    . [DISCONTINUED] fluticasone (FLONASE) 50 MCG/ACT nasal spray 2 sprays by Nasal route daily.    . [DISCONTINUED] temazepam (RESTORIL) 30 MG capsule Take 30 mg by mouth at bedtime as needed.   No facility-administered encounter medications on file as of 01/02/2021.    Allergies (verified) Jardiance [empagliflozin], Oxycodone-acetaminophen, Trulicity [dulaglutide], Codeine phosphate, Erythromycin, Metformin and related, Morphine sulfate, Oxycodone hcl, Sulfamethoxazole, and Tylenol [acetaminophen]   History: Past Medical History:  Diagnosis Date  . Allergy   . Anxiety   . Arthritis   . Asthma   . Blood transfusion without reported diagnosis 2002   with knee replacement  . Depression   . Diabetes mellitus   . Diabetes mellitus type 2 with retinopathy ([place])   . Diverticulitis   . Dyspareunia    vaginal dryness  . Fibroid    reason for hysterectomy  . GERD (gastroesophageal reflux disease)   . 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 Heart disease   . Hyperlipidemia   . Hypertension   . IBS (irritable bowel syndrome)   . Lichen sclerosus   . OSA (obstructive sleep apnea)   . Osteopenia 2009  . Psoriasis   . Sleep apnea   . STD (sexually transmitted disease)    Hx HPV  . Thyroid disease    Past Surgical History:  Procedure Laterality Date  . ABDOMINAL HYSTERECTOMY   1995   TAH/LSO--Dr. Ubaldo Glassing  . [place]  . FOOT SURGERY Bilateral   . NASAL SINUS SURGERY    . OOPHORECTOMY    . PARTIAL COLECTOMY     -RSO  . ROTATOR CUFF REPAIR Left   . TOTAL KNEE ARTHROPLASTY Bilateral 2002, 2004   Family History  Problem Relation Age of Onset  . COPD Mother   . Diabetes Mother   . Heart attack Father 50  . Hypertension Father   . Heart attack Sister 53  . Diabetes Sister   . Hypertension Sister   . Diabetes Brother   . Hypertension Sister   . Diabetes Sister   . Hyperlipidemia Other        fhx  . Hypertension Other        fhx  . Cancer Other        ovarian/grandmother  . Ovarian cancer Maternal Aunt   . Diabetes Maternal Aunt   . Stomach cancer Maternal Grandmother   .  Liver cancer Maternal Grandmother    Social History   Socioeconomic History  . Marital status: Married    Spouse name: Juanda Crumble  . Number of children: 2  . Years of education: 66  . Highest education level: Not on file  Occupational History  . Occupation: disabled    Comment: orthopedic / psych  Tobacco Use  . Smoking status: Never Smoker  . Smokeless tobacco: Never Used  Vaping Use  . Vaping Use: Never used  Substance and Sexual Activity  . Alcohol use: No    Alcohol/week: 0.0 standard drinks    Comment: stopped in 2018, was social   . Drug use: No  . Sexual activity: Not Currently    Partners: Male    Birth control/protection: Surgical    Comment: TAH  Other Topics Concern  . Not on file  Social History Narrative   Grew up in [place]. Currently resides in resides in a house with her husband. 4 dogs. Fun: Play with your dogs and be with grandchildren, needle work.    Denies any religious beliefs effecting healthcare.    Social Determinants of Health   Financial Resource Strain: Low Risk   . Difficulty of Paying Living Expenses: Not hard at all  Food Insecurity: No Food Insecurity  . Worried About Charity fundraiser in the Last Year: Never true  . Ran  Out of Food in the Last Year: Never true  Transportation Needs: No Transportation Needs  . Lack of Transportation (Medical): No  . Lack of Transportation (Non-Medical): No  Physical Activity: Inactive  . Days of Exercise per Week: 0 days  . Minutes of Exercise per Session: 0 min  Stress: No Stress Concern Present  . Feeling of Stress : Not at all  Social Connections: Moderately Isolated  . Frequency of Communication with Friends and Family: More than three times a week  . Frequency of Social Gatherings with Friends and Family: Never  . Attends Religious Services: Never  . Active Member of Clubs or Organizations: No  . Attends Archivist Meetings: Never  . Marital Status: Married    Tobacco Counseling Counseling given: Not Answered   Clinical Intake:  Pre-visit preparation completed: Yes  Pain : No/denies pain     Nutritional Risks: None Diabetes: Yes CBG done?: No Did pt. bring in CBG monitor from home?: No  How often do you need to have someone help you when you read instructions, pamphlets, or other written materials from your doctor or pharmacy?: 1 - Never What is the last grade level you completed in school?: [employer]  Diabetic? yes  Interpreter Needed?: No  Information entered by :: Lisette Abu, LPN   Activities of Daily Living In your present state of health, do you have any difficulty performing the following activities: 01/02/2021  Hearing? N  Vision? N  Difficulty concentrating or making decisions? N  Walking or climbing stairs? N  Dressing or bathing? N  Doing errands, shopping? N  Preparing Food and eating ? N  Using the Toilet? N  In the past six months, have you accidently leaked urine? N  Do you have problems with loss of bowel control? N  Managing your Medications? N  Managing your Finances? N  Housekeeping or managing your Housekeeping? N  Some recent data might be hidden    Patient Care Team: Janith Lima, MD  as PCP - General (Internal Medicine) Nunzio Cobbs, MD as Consulting Physician (Obstetrics and  Gynecology) Irene Shipper, MD as Consulting Physician (Gastroenterology) Chucky May, MD as Consulting Physician (Psychiatry) Webb Laws, [place] as Referring Physician (Optometry)  Indicate any recent Medical Services you may have received from other than [facility] providers in the past year (date may be approximate).     Assessment:   This is a routine wellness examination for Meredith Hayden.  Hearing/Vision screen No exam data present  Dietary issues and exercise activities discussed: Current Exercise Habits: The patient does not participate in regular exercise at present, Exercise limited by: None identified  Goals    .  Patient Stated      Take my dogs walking and create a nutrition plan that works for me and my husband. I want to paint and remodel my house. Spending time with family and grandchildren. Make a new year for [place].     .  Patient Stated      "To be more mobile, doing chair exercises, walking in lifestyle. Visit grandchildren in [place]"    .  Patient Stated (pt-stated)      Not to get Covid.      Depression Screen PHQ 2/9 Scores 01/02/2021 05/23/2020 02/10/2020 07/28/2019 01/07/2019 01/01/2018  PHQ - 2 Score 0 _0 PHQ- 9 Score - _1 Fall Risk Fall Risk  01/02/2021 05/23/2020 02/10/2020 07/28/2019 01/12/2019  Falls in the past year? 0 0 0 0 0  Number falls in past yr: 0 0 0 0 -  Injury with Fall? 0 0 0 0 -  Comment - - - - -  Risk for fall due to : No Fall Risks Impaired balance/gait Orthopedic patient Impaired balance/gait;Impaired mobility -  Follow up Falls evaluation completed Falls evaluation completed Falls evaluation completed Falls evaluation completed -    FALL RISK PREVENTION PERTAINING TO THE HOME:  Any stairs in or around the home? No  If so, are there any without handrails? No  Home free of loose throw rugs in walkways, pet beds,  electrical cords, etc? Yes  Adequate lighting in your home to reduce risk of falls? Yes   ASSISTIVE DEVICES UTILIZED TO PREVENT FALLS:  Life alert? No  Use of a cane, walker or w/c? No  Grab bars in the bathroom? Yes  Shower chair or bench in shower? Yes  Elevated toilet seat or a handicapped toilet? Yes   TIMED UP AND GO:  Was the test performed? No .  Length of time to ambulate 10 feet: 0 sec.   Gait steady and fast without use of assistive device  Cognitive Function: MMSE - Mini Mental State Exam 01/01/2018  Not completed: Refused        Immunizations Immunization History  Administered Date(s) Administered  . Fluad Quad(high Dose 65+) 10/21/2019, 10/13/2020  . Hepatitis A, Adult 03/01/2015  . Influenza Split 09/19/2011  . Influenza Whole 12/08/2007, 10/07/2009, 10/13/2010  . Influenza, High Dose Seasonal PF 10/12/2016, 09/05/2017, 11/25/2018  . Influenza,inj,Quad PF,6+ Mos 12/09/2013, 09/30/2015  . Influenza-Unspecified 11/26/2014  . Pneumococcal Conjugate-13 01/01/2018  . Pneumococcal Polysaccharide-23 09/19/2011, 01/07/2019  . Td 09/19/2007  . Tdap 05/19/2018  . Zoster 07/07/2013    TDAP status: Up to date  Flu Vaccine status: Up to date  Pneumococcal vaccine status: Up to date  Covid-19 vaccine status: Declined, Education has been provided regarding the importance of this vaccine but patient still declined. Advised may receive this vaccine at local pharmacy or Health Dept.or vaccine clinic. Aware to provide a  copy of the vaccination record if obtained from local pharmacy or Health Dept. Verbalized acceptance and understanding.  Qualifies for Shingles Vaccine? Yes   Zostavax completed Yes   Shingrix Completed?: No.    Education has been provided regarding the importance of this vaccine. Patient has been advised to call insurance company to determine out of pocket expense if they have not yet received this vaccine. Advised may also receive vaccine at local  pharmacy or Health Dept. Verbalized acceptance and understanding.  Screening Tests Health Maintenance  Topic Date Due  . COVID-19 Vaccine (1) Never done  . MAMMOGRAM  06/04/2020  . FOOT EXAM  02/09/2021  . HEMOGLOBIN A1C  04/13/2021  . OPHTHALMOLOGY EXAM  04/19/2021  . COLONOSCOPY (Pts 45-93yr Insurance coverage will need to be confirmed)  11/14/2026  . TETANUS/TDAP  05/19/2028  . INFLUENZA VACCINE  Completed  . DEXA SCAN  Completed  . Hepatitis C Screening  Completed  . PNA vac Low Risk Adult  Completed    Health Maintenance  Health Maintenance Due  Topic Date Due  . COVID-19 Vaccine (1) Never done  . MAMMOGRAM  06/04/2020    Colorectal cancer screening: Type of screening: Colonoscopy. Completed 11/14/2016. Repeat every 10 years  Mammogram status: Completed 06/04/2018. Repeat every year  Bone Density status: Completed 03/09/2015. Results reflect: Bone density results: OSTEOPENIA. Repeat every 2-3 years.  Lung Cancer Screening: (Low Dose CT Chest recommended if Age 69-80years, 30 pack-year currently smoking OR have quit w/in 15years.) does not qualify.   Lung Cancer Screening Referral: no  Additional Screening:  Hepatitis C Screening: does qualify; Completed yes  Vision Screening: Recommended annual ophthalmology exams for early detection of glaucoma and other disorders of the eye. Is the patient up to date with their annual eye exam?  Yes  Who is the provider or what is the name of the office in which the patient attends annual eye exams? [facility] If pt is not established with a provider, would they like to be referred to a provider to establish care? No .   Dental Screening: Recommended annual dental exams for proper oral hygiene  Community Resource Referral / Chronic Care Management: CRR required this visit?  No   CCM required this visit?  No      Plan:     I have personally reviewed and noted the following in the patient's chart:   . Medical and  social history . Use of alcohol, tobacco or illicit drugs  . Current medications and supplements . Functional ability and status . Nutritional status . Physical activity . Advanced directives . List of other physicians . Hospitalizations, surgeries, and ER visits in previous 12 months . Vitals . Screenings to include cognitive, depression, and falls . Referrals and appointments  In addition, I have reviewed and discussed with patient certain preventive protocols, quality metrics, and best practice recommendations. A written personalized care plan for preventive services as well as general preventive health recommendations were provided to patient.     SSheral Flow LPN   13/12/4968  Nurse Notes:  Patient is cogitatively intact. There were no vitals filed for this visit. There is no height or weight on file to calculate BMI. Patient stated that she has no issues with gait or balance; does not use any assistive devices.

## 2021-01-17 ENCOUNTER — Encounter: Payer: Self-pay | Admitting: Internal Medicine

## 2021-01-17 ENCOUNTER — Other Ambulatory Visit: Payer: Self-pay | Admitting: Internal Medicine

## 2021-01-17 DIAGNOSIS — IMO0002 Reserved for concepts with insufficient information to code with codable children: Secondary | ICD-10-CM

## 2021-01-17 DIAGNOSIS — E11319 Type 2 diabetes mellitus with unspecified diabetic retinopathy without macular edema: Secondary | ICD-10-CM

## 2021-01-17 DIAGNOSIS — E1129 Type 2 diabetes mellitus with other diabetic kidney complication: Secondary | ICD-10-CM

## 2021-01-17 MED ORDER — TOUJEO SOLOSTAR 300 UNIT/ML ~~LOC~~ SOPN: 60.0000 [IU] | PEN_INJECTOR | Freq: Every day | SUBCUTANEOUS | 0 refills | Status: DC

## 2021-02-15 ENCOUNTER — Other Ambulatory Visit: Payer: Self-pay | Admitting: Internal Medicine

## 2021-02-15 DIAGNOSIS — I10 Essential (primary) hypertension: Secondary | ICD-10-CM

## 2021-02-18 ENCOUNTER — Encounter: Payer: Self-pay | Admitting: Internal Medicine

## 2021-02-18 DIAGNOSIS — IMO0002 Reserved for concepts with insufficient information to code with codable children: Secondary | ICD-10-CM

## 2021-02-18 DIAGNOSIS — E1165 Type 2 diabetes mellitus with hyperglycemia: Secondary | ICD-10-CM

## 2021-02-20 MED ORDER — INSULIN LISPRO (1 UNIT DIAL) 100 UNIT/ML (KWIKPEN): 16.0000 [IU] | PEN_INJECTOR | Freq: Three times a day (TID) | SUBCUTANEOUS | 1 refills | Status: DC

## 2021-02-27 ENCOUNTER — Other Ambulatory Visit: Payer: Self-pay | Admitting: Internal Medicine

## 2021-02-27 DIAGNOSIS — E11319 Type 2 diabetes mellitus with unspecified diabetic retinopathy without macular edema: Secondary | ICD-10-CM

## 2021-02-27 DIAGNOSIS — IMO0002 Reserved for concepts with insufficient information to code with codable children: Secondary | ICD-10-CM

## 2021-02-27 DIAGNOSIS — E1129 Type 2 diabetes mellitus with other diabetic kidney complication: Secondary | ICD-10-CM

## 2021-03-01 ENCOUNTER — Other Ambulatory Visit: Payer: Self-pay | Admitting: Internal Medicine

## 2021-03-01 DIAGNOSIS — E11319 Type 2 diabetes mellitus with unspecified diabetic retinopathy without macular edema: Secondary | ICD-10-CM

## 2021-03-01 DIAGNOSIS — IMO0002 Reserved for concepts with insufficient information to code with codable children: Secondary | ICD-10-CM

## 2021-03-01 DIAGNOSIS — E1165 Type 2 diabetes mellitus with hyperglycemia: Secondary | ICD-10-CM

## 2021-03-05 ENCOUNTER — Encounter: Payer: Self-pay | Admitting: Internal Medicine

## 2021-03-06 ENCOUNTER — Other Ambulatory Visit: Payer: Self-pay | Admitting: Internal Medicine

## 2021-03-06 DIAGNOSIS — E1129 Type 2 diabetes mellitus with other diabetic kidney complication: Secondary | ICD-10-CM

## 2021-03-06 DIAGNOSIS — IMO0002 Reserved for concepts with insufficient information to code with codable children: Secondary | ICD-10-CM

## 2021-03-06 MED ORDER — INSUPEN PEN NEEDLES 33G X 4 MM MISC: 1.0000 | Freq: Three times a day (TID) | 5 refills | Status: DC

## 2021-03-25 ENCOUNTER — Other Ambulatory Visit: Payer: Self-pay | Admitting: Internal Medicine

## 2021-03-25 DIAGNOSIS — E1129 Type 2 diabetes mellitus with other diabetic kidney complication: Secondary | ICD-10-CM

## 2021-03-25 DIAGNOSIS — E11319 Type 2 diabetes mellitus with unspecified diabetic retinopathy without macular edema: Secondary | ICD-10-CM

## 2021-03-25 DIAGNOSIS — IMO0002 Reserved for concepts with insufficient information to code with codable children: Secondary | ICD-10-CM

## 2021-04-05 ENCOUNTER — Other Ambulatory Visit: Payer: Self-pay | Admitting: Internal Medicine

## 2021-04-05 DIAGNOSIS — IMO0002 Reserved for concepts with insufficient information to code with codable children: Secondary | ICD-10-CM

## 2021-04-05 DIAGNOSIS — E1165 Type 2 diabetes mellitus with hyperglycemia: Secondary | ICD-10-CM

## 2021-04-18 DIAGNOSIS — H2513 Age-related nuclear cataract, bilateral: Secondary | ICD-10-CM | POA: Diagnosis not present

## 2021-04-18 DIAGNOSIS — H5203 Hypermetropia, bilateral: Secondary | ICD-10-CM | POA: Diagnosis not present

## 2021-04-18 DIAGNOSIS — H524 Presbyopia: Secondary | ICD-10-CM | POA: Diagnosis not present

## 2021-04-18 DIAGNOSIS — E119 Type 2 diabetes mellitus without complications: Secondary | ICD-10-CM | POA: Diagnosis not present

## 2021-04-18 DIAGNOSIS — H52223 Regular astigmatism, bilateral: Secondary | ICD-10-CM | POA: Diagnosis not present

## 2021-04-20 LAB — HM DIABETES EYE EXAM

## 2021-05-01 ENCOUNTER — Other Ambulatory Visit: Payer: Self-pay | Admitting: Internal Medicine

## 2021-05-01 DIAGNOSIS — IMO0002 Reserved for concepts with insufficient information to code with codable children: Secondary | ICD-10-CM

## 2021-05-01 DIAGNOSIS — E11319 Type 2 diabetes mellitus with unspecified diabetic retinopathy without macular edema: Secondary | ICD-10-CM

## 2021-05-01 DIAGNOSIS — E1165 Type 2 diabetes mellitus with hyperglycemia: Secondary | ICD-10-CM

## 2021-05-01 DIAGNOSIS — I10 Essential (primary) hypertension: Secondary | ICD-10-CM

## 2021-05-01 DIAGNOSIS — E785 Hyperlipidemia, unspecified: Secondary | ICD-10-CM

## 2021-05-01 MED ORDER — ROSUVASTATIN CALCIUM 20 MG PO TABS: 20.0000 mg | ORAL_TABLET | Freq: Every day | ORAL | 1 refills | Status: DC

## 2021-05-23 ENCOUNTER — Telehealth: Payer: Self-pay | Admitting: Internal Medicine

## 2021-05-23 NOTE — Progress Notes (Signed)
  Chronic Care Management   Outreach Note  05/23/2021 Name: Meredith Hayden MRN: [id number] DOB: 08-08-1950  Referred by: Janith Lima, MD Reason for referral : No chief complaint on file.   An unsuccessful telephone outreach was attempted today. The patient was referred to the pharmacist for assistance with care management and care coordination.   Follow Up Plan:   [place]

## 2021-06-01 ENCOUNTER — Telehealth: Payer: Self-pay | Admitting: Internal Medicine

## 2021-06-01 NOTE — Progress Notes (Signed)
  Chronic Care Management   Outreach Note  06/01/2021 Name: Nykiah Ma MRN: [id number] DOB: [birth year]  Referred by: Janith Lima, MD Reason for referral : No chief complaint on file.   A second unsuccessful telephone outreach was attempted today. The patient was referred to pharmacist for assistance with care management and care coordination.  Follow Up Plan:   Lauretta Grill [facility] Scheduler

## 2021-06-01 NOTE — Chronic Care Management (AMB) (Signed)
  Chronic Care Management   Note  06/01/2021 Name: Dacoda Spallone MRN: [id number] DOB: 15-May-1950  Bernadette Gores is a 71 y.o. year old female who is a primary care patient of Janith Lima, MD. I reached out to Merwyn Katos Noteboom by phone today in response to a referral sent by Ms. Merwyn Katos Wessler's PCP, Janith Lima, MD.   Ms. Quinton was given information about Chronic Care Management services today including:  1. CCM service includes personalized support from designated clinical staff supervised by her physician, including individualized plan of care and coordination with other care providers 2. 24/7 contact phone numbers for assistance for urgent and routine care needs. 3. Service will only be billed when office clinical staff spend 20 minutes or more in a month to coordinate care. 4. Only one practitioner may furnish and bill the service in a calendar month. 5. The patient may stop CCM services at any time (effective at the end of the month) by phone call to the office staff.   Patient agreed to services and verbal consent obtained.   Follow up plan:   Lauretta Grill [facility] Scheduler

## 2021-06-10 ENCOUNTER — Other Ambulatory Visit: Payer: Self-pay | Admitting: Internal Medicine

## 2021-06-10 DIAGNOSIS — IMO0002 Reserved for concepts with insufficient information to code with codable children: Secondary | ICD-10-CM

## 2021-06-10 DIAGNOSIS — E11319 Type 2 diabetes mellitus with unspecified diabetic retinopathy without macular edema: Secondary | ICD-10-CM

## 2021-06-20 ENCOUNTER — Telehealth: Payer: Self-pay | Admitting: Internal Medicine

## 2021-06-20 DIAGNOSIS — E11319 Type 2 diabetes mellitus with unspecified diabetic retinopathy without macular edema: Secondary | ICD-10-CM

## 2021-06-20 DIAGNOSIS — IMO0002 Reserved for concepts with insufficient information to code with codable children: Secondary | ICD-10-CM

## 2021-06-20 DIAGNOSIS — E1129 Type 2 diabetes mellitus with other diabetic kidney complication: Secondary | ICD-10-CM

## 2021-06-21 ENCOUNTER — Other Ambulatory Visit: Payer: Self-pay | Admitting: Internal Medicine

## 2021-06-21 DIAGNOSIS — IMO0002 Reserved for concepts with insufficient information to code with codable children: Secondary | ICD-10-CM

## 2021-06-21 DIAGNOSIS — E11319 Type 2 diabetes mellitus with unspecified diabetic retinopathy without macular edema: Secondary | ICD-10-CM

## 2021-06-21 MED ORDER — TOUJEO SOLOSTAR 300 UNIT/ML ~~LOC~~ SOPN: 60.0000 [IU] | PEN_INJECTOR | Freq: Every day | SUBCUTANEOUS | 0 refills | Status: DC

## 2021-06-21 NOTE — Telephone Encounter (Signed)
   Appointment scheduled 7/25 Patient requesting short supply

## 2021-07-12 ENCOUNTER — Other Ambulatory Visit: Payer: Self-pay | Admitting: Internal Medicine

## 2021-07-12 DIAGNOSIS — E1129 Type 2 diabetes mellitus with other diabetic kidney complication: Secondary | ICD-10-CM

## 2021-07-12 DIAGNOSIS — E11319 Type 2 diabetes mellitus with unspecified diabetic retinopathy without macular edema: Secondary | ICD-10-CM

## 2021-07-12 DIAGNOSIS — I10 Essential (primary) hypertension: Secondary | ICD-10-CM

## 2021-07-12 DIAGNOSIS — IMO0002 Reserved for concepts with insufficient information to code with codable children: Secondary | ICD-10-CM

## 2021-07-17 ENCOUNTER — Other Ambulatory Visit: Payer: Self-pay

## 2021-07-17 ENCOUNTER — Ambulatory Visit (INDEPENDENT_AMBULATORY_CARE_PROVIDER_SITE_OTHER): Payer: Medicare Other

## 2021-07-17 ENCOUNTER — Other Ambulatory Visit: Payer: Self-pay | Admitting: Internal Medicine

## 2021-07-17 DIAGNOSIS — Z794 Long term (current) use of insulin: Secondary | ICD-10-CM | POA: Insufficient documentation

## 2021-07-17 DIAGNOSIS — E1129 Type 2 diabetes mellitus with other diabetic kidney complication: Secondary | ICD-10-CM | POA: Diagnosis not present

## 2021-07-17 DIAGNOSIS — E119 Type 2 diabetes mellitus without complications: Secondary | ICD-10-CM | POA: Insufficient documentation

## 2021-07-17 DIAGNOSIS — I10 Essential (primary) hypertension: Secondary | ICD-10-CM

## 2021-07-17 DIAGNOSIS — IMO0002 Reserved for concepts with insufficient information to code with codable children: Secondary | ICD-10-CM

## 2021-07-17 DIAGNOSIS — N1831 Chronic kidney disease, stage 3a: Secondary | ICD-10-CM

## 2021-07-17 DIAGNOSIS — E1165 Type 2 diabetes mellitus with hyperglycemia: Secondary | ICD-10-CM | POA: Diagnosis not present

## 2021-07-17 DIAGNOSIS — E039 Hypothyroidism, unspecified: Secondary | ICD-10-CM

## 2021-07-17 DIAGNOSIS — K580 Irritable bowel syndrome with diarrhea: Secondary | ICD-10-CM

## 2021-07-17 MED ORDER — FREESTYLE LIBRE 2 READER DEVI: 1.0000 | Freq: Every day | 5 refills | Status: DC

## 2021-07-17 MED ORDER — FREESTYLE LIBRE 2 SENSOR MISC: 1.0000 | Freq: Every day | 5 refills | Status: DC

## 2021-07-17 NOTE — Patient Instructions (Signed)
Visit Information   PATIENT GOALS:   Goals Addressed             This Visit's Progress    Lifestyle Change-Hypertension       Timeframe:  Long-Range Goal Priority:  High Start Date:  07/17/2021                           Expected End Date:  01/17/2022                     Follow Up Date 07/24/2021   - agree to work together to make changes - ask questions to understand - have a family meeting to talk about healthy habits - learn about high blood pressure    Why is this important?   The changes that you are asked to make may be hard to do.  This is especially true when the changes are life-long.  Knowing why it is important to you is the first step.  Working on the change with your family or support person helps you not feel alone.  Reward yourself and family or support person when goals are met. This can be an activity you choose like bowling, hiking, biking, swimming or shooting hoops.        Monitor and Manage My Blood Sugar-Diabetes Type 2       Timeframe:  Short-Term Goal Priority:  High Start Date: 07/17/2021                            Expected End Date:  01/17/2022                     Follow Up Date 07/24/2021   - check blood sugar at prescribed times - check blood sugar before and after exercise - check blood sugar if I feel it is too high or too low - enter blood sugar readings and medication or insulin into daily log - take the blood sugar log to all doctor visits - take the blood sugar meter to all doctor visits    Why is this important?   Checking your blood sugar at home helps to keep it from getting very high or very low.  Writing the results in a diary or log helps the doctor know how to care for you.  Your blood sugar log should have the time, date and the results.  Also, write down the amount of insulin or other medicine that you take.  Other information, like what you ate, exercise done and how you were feeling, will also be helpful.            Consent to CCM Services: Ms. Sutliff was given information about Chronic Care Management services today including:  CCM service includes personalized support from designated clinical staff supervised by her physician, including individualized plan of care and coordination with other care providers 24/7 contact phone numbers for assistance for urgent and routine care needs. Service will only be billed when office clinical staff spend 20 minutes or more in a month to coordinate care. Only one practitioner may furnish and bill the service in a calendar month. The patient may stop CCM services at any time (effective at the end of the month) by phone call to the office staff. The patient will be responsible for cost sharing (co-pay) of up to 20% of the service fee (after annual deductible is  met).  Patient agreed to services and verbal consent obtained.   Patient verbalizes understanding of instructions provided today and agrees to view in [place].   Face to Face appointment with care management team member scheduled for: 1 week The patient has been provided with contact information for the care management team and has been advised to call with any health related questions or concerns.  Next PCP appointment scheduled for: 1 week  Tomasa Blase, PharmD Clinical Pharmacist, [place]   CLINICAL CARE PLAN: Patient Care Plan: CCM Care Plan     Problem Identified: HTN, HLD, DM2, IBS, CKD, Asthma, Hypothyroidism, Allergic Rhinitis, Depression, Anxiety, Bipolar, and Insomnia   Priority: High  Onset Date: 07/17/2021     Goal: Disease Management   Start Date: 07/17/2021  Expected End Date: 01/17/2022  This Visit's Progress: On track  Priority: High  Note:   Current Barriers:  Unable to independently monitor therapeutic efficacy  Pharmacist Clinical Goal(s):  Patient will achieve adherence to monitoring guidelines and medication adherence to achieve therapeutic  efficacy maintain control of BP, LDL, and A1c as evidenced by blood pressure in office, and next lab results  contact provider office for questions/concerns as evidenced notation of same in electronic health record through collaboration with PharmD and provider.   Interventions: 1:1 collaboration with Janith Lima, MD regarding development and update of comprehensive plan of care as evidenced by provider attestation and co-signature Inter-disciplinary care team collaboration (see longitudinal plan of care) Comprehensive medication review performed; medication list updated in electronic medical record  Hypertension (BP goal <130/80) -Controlled -Current treatment: Lisinopril 75m daily  Metoprolol tartrate 217m- 1 tablet twice daily  -Medications previously tried: atenolol,  hctz -Current home readings: not currently checking at home, most recent office visits 126/84 -68, 112/68-79, 118/62-61 -Current dietary habits: reports that she eats a sodium reduced diet, very cognizant of her diet  -Current exercise habits: reports that she does not have any scheduled exercise or activity  -Denies hypotensive/hypertensive symptoms -Educated on BP goals and benefits of medications for prevention of heart attack, stroke and kidney damage; Daily salt intake goal < 2300 mg; Exercise goal of 150 minutes per week; Importance of home blood pressure monitoring; -Counseled on diet and exercise extensively Recommended to continue current medication  Hyperlipidemia: (LDL goal < 70) -Controlled Last LDL 6070mL (02/10/2020) -Current treatment: Rosuvastatin 50m53mily  Aspirin 81mg33mly  -Medications previously tried: atorvastatin, vascepa   -Current dietary patterns: reports that she does not eat any fried foods, tries to limit red meat intake  -Current exercise habits: none at this time  -Educated on Cholesterol goals;  Benefits of statin for ASCVD risk reduction; Importance of limiting foods high  in cholesterol; Exercise goal of 150 minutes per week; -Counseled on diet and exercise extensively Recommended to continue current medication  Diabetes (A1c goal <7%) -Controlled -Last A1c 7.0% (10/13/2020) -Current medications: Toujeo - 60 units daily  Humalog - 16 units 3 times daily  Gvoke hypopen - 1mg/094mL - inject 1 into skin as needed  -Medications previously tried: invokana, onglyza, trulicity, glipizide XL, glyburide, lantus, metformin, metformin XR, januvia,   -Current home glucose readings fasting glucose: 120-150 when she checks  -Reports hypoglycemic/ episode last week - BG was ~38 - very reports that she can becomes very symptomatic, first time in a number of months - notes that she did not use gvoke pen, used candy instead, reviewed appropriate treatment for hypoglycemia  -Current meal patterns:  breakfast: cup  of tea +/- a slice of toast   lunch: sandwich, peanutbutter on crackers on occasion  dinner: protein, vegetables, carb snacks: does not eat after supper (due to reflux) - may be a few cookies with tea / a few crackers / apple  drinks: tea, kombucha  -Current exercise: none at this time  -Educated on A1c and blood sugar goals; Complications of diabetes including kidney damage, retinal damage, and cardiovascular disease; Exercise goal of 150 minutes per week; Benefits of weight loss; Proper insulin injection technique; Prevention and management of hypoglycemic episodes; Continuous glucose monitoring; Carbohydrate counting and/or plate method -Counseled to check feet daily and get yearly eye exams -Counseled on diet and exercise extensively Recommended to continue current medication - Discussed with patient about importance of checking blood sugars prior to each meal - should qualify for CGM, will have libre meter and supplies sent to pharmacy - likely patient would benefit from sliding scale for humalog, will require patient to be checking blood sugars to be  able to develop for her.  Asthma (Goal: control symptoms and prevent exacerbations) -Controlled -Current treatment  Ipratropium (Atrovent) 355mg/act - 2 puffs every 4 hours as needed  -Medications previously tried: albuterol   -Exacerbations requiring treatment in last 6 months: denies  -Frequency of rescue inhaler use: 1 time per week -Counseled on Proper inhaler technique; When to use rescue inhaler -Recommended to continue current medication  Depression/Anxiety/ Bipolar Disorder/ Insomnia (Goal: Mood control / prevention of anxiety attacks ) - Managed by Dr. RChucky May -Controlled -Current treatment: Sertraline 1023m- 2 tablets daily  Lorazepam 55m7m 1 tablet 3 times daily  Gabapentin 100m755m100mg2mce daily  Zolpidem 5mg -16mtablet nightly  -Medications previously tried/failed: desvenlafaxine, tempazepam, lamotrigine, carbamazepine, trintellix, alprazolam -Follows with Dr. RupindChucky Mayental health support -Educated on Benefits of medication for symptom control Benefits of cognitive-behavioral therapy with or without medication -Recommended for patient to continuing to follow with psych provider for management  Hypothyroidism (Goal: Maintenance of euthyroid levels) -Controlled -Last TSH level 1.24uIU/mL -Current treatment  Synthroid 75mcg 72mtablet daily  -Medications previously tried: levothyroxine  -Recommended to continue current medication  Chronic Kidney Disease (Goal: prevention of disease progression ) -Controlled -Last eGFR: 38.83 mL/min -Last CrCl: 66.09 mL/min -Current treatment  Avoidance of nephrotoxic drugs, promotion of blood sugar control  -Counseled on diet and exercise extensively Recommended to continue current medication  Allergic Rhinitis (Goal: prevention of allergies / treatment of symptoms) -Controlled -Current treatment  Cetirizine 10mg - 18mblet daily  Flonase 50 mcg/act - 1 spray into each nostril daily  -Medications  previously tried: flonase   -Recommended to continue current medication  Irritable Bowel Syndrom (Goal: prevention of bowel issues) -Controlled -Current treatment  Simethicone 125mg - 162msule daily as needed  Omeprazole 20mg - 1 57mule daily as needed  Loperamide 2mg - 1 ta97m daily as needed  Dicyclomine 10mg - 1 ca55me 3 times daily as needed before meals  Tums 500mg - 1 tab67mas needed  -Medications previously tried: cimetidine, pylera, ranitidine, lomotil   -Counseled on diet and exercise extensively Recommended to continue current medication  Health Maintenance -Vaccine gaps: COVID Booster, Shingles vaccine -Current therapy:  Cyanocobalamin 125mcg - 1 tab26mdaily  Vitamin D3 5000 units - 1 capsule daily  -Educated on Cost vs benefit of each product must be carefully weighed by individual consumer -Patient is satisfied with current therapy and denies issues -Recommended to continue current medication  Patient Goals/Self-Care Activities Patient will:  - take medications as prescribed check glucose before each meal and at bedtime, document, and provide at future appointments collaborate with provider on medication access solutions target a minimum of 150 minutes of moderate intensity exercise weekly engage in dietary modifications by moderation of carb intake   Follow Up Plan: Face to Face appointment with care management team member scheduled for:  1 week The patient has been provided with contact information for the care management team and has been advised to call with any health related questions or concerns.  Next PCP appointment scheduled for: 1 week

## 2021-07-17 NOTE — Progress Notes (Signed)
Chronic Care Management Pharmacy Note  07/17/2021 Name:  Meredith Hayden MRN:  [id number] DOB:  Nov 18, 1950  Summary:  - Patient reports that she has been doing well since last appointment, notes to one instance of hypoglycemia since last appointment which was able to be corrected with candy. Patient notes that she has not been checking blood sugars as often as she knows she should be, is interested in freestyle [place] as medicare requirements have changed since it was last prescribed  - Patient continues to closely watch her diet, avoids sodium / does not eat fried foods, BP and LDL under control with last checks, patient denies issues from current BP and cholesterol medications -Continues to follow with Dr. Toy Care every 6 months for mental health, recently stopped trintellix and alprazolam, started sertraline and lorazepam instead   Recommendations/Changes made from today's visit: - Patient to start checking blood libre 2 system - patient to start continuously monitoring glucose - checking at least 4 times daily (plan is to have patient in for demo prior to PCP appointment next week) - Patient to increase activity / exercise to help keep BP and LDL under control and to improve BG further  Subjective: Meredith Hayden is an 71 y.o. year old female who is a primary patient of Janith Lima, MD.  The CCM team was consulted for assistance with disease management and care coordination needs.    Engaged with patient by telephone for initial visit in response to provider referral for pharmacy case management and/or care coordination services.   Consent to Services:  The patient was given the following information about Chronic Care Management services today, agreed to services, and gave verbal consent: 1. CCM service includes personalized support from designated clinical staff supervised by the primary care provider, including individualized plan of care and coordination with other care  providers 2. 24/7 contact phone numbers for assistance for urgent and routine care needs. 3. Service will only be billed when office clinical staff spend 20 minutes or more in a month to coordinate care. 4. Only one practitioner may furnish and bill the service in a calendar month. 5.The patient may stop CCM services at any time (effective at the end of the month) by phone call to the office staff. 6. The patient will be responsible for cost sharing (co-pay) of up to 20% of the service fee (after annual deductible is met). Patient agreed to services and consent obtained.  Patient Care Team: Janith Lima, MD as PCP - General (Internal Medicine) Nunzio Cobbs, MD as Consulting Physician (Obstetrics and Gynecology) Irene Shipper, MD as Consulting Physician (Gastroenterology) Chucky May, MD as Consulting Physician (Psychiatry) Webb Laws, [place] as Referring Physician (Optometry) Delice Bison Darnelle Maffucci, [facility] as Pharmacist (Pharmacist)  Recent office visits: 10/13/2020 - PCP visit - reports of recent weight gain, patient agreeable to start to change diet to improve BG control  05/03/2020 - PCP visit - complains of 2 month history of abdominal pain / cramping - prescribed bentyl   Recent consult visits: [facility] visits: None in previous 6 months  Objective:  Lab Results  Component Value Date   CREATININE 1.39 (H) 10/13/2020   BUN 21 10/13/2020   GFR 38.83 (L) 10/13/2020   GFRNONAA 67 (L) 01/27/2015   GFRAA 78 (L) 01/27/2015   NA 139 10/13/2020   K 4.2 10/13/2020   CALCIUM 8.8 10/13/2020   CO2 26 10/13/2020   GLUCOSE 133 (H) 10/13/2020  Lab Results  Component Value Date/Time   HGBA1C 7.0 (H) 10/13/2020 03:05 PM   HGBA1C 7.0 (H) 05/23/2020 03:00 PM   GFR 38.83 (L) 10/13/2020 03:05 PM   GFR 37.13 (L) 05/23/2020 03:00 PM   MICROALBUR <0.7 02/10/2020 12:04 PM   MICROALBUR 1.3 01/07/2019 04:39 PM    Last diabetic Eye exam:  Lab Results  Component Value Date/Time    HMDIABEYEEXA No Retinopathy 04/19/2020 12:00 AM    Last diabetic Foot exam:  Lab Results  Component Value Date/Time   HMDIABFOOTEX done- podiatry 11/30/2013 12:00 AM     Lab Results  Component Value Date   CHOL 138 02/10/2020   HDL 47.10 02/10/2020   LDLCALC 60 02/10/2020   LDLDIRECT 69.0 01/07/2019   TRIG 158.0 (H) 02/10/2020   CHOLHDL 3 02/10/2020    Hepatic Function Latest Ref Rng & Units 02/10/2020 01/07/2019 02/10/2018  Total Protein 6.0 - 8.3 g/dL 7.2 7.1 7.1  Albumin 3.5 - 5.2 g/dL 4.2 4.2 3.9  AST 0 - 37 U/L '15 20 22  ' ALT 0 - 35 U/L '9 12 17  ' Alk Phosphatase 39 - 117 U/L 77 82 80  Total Bilirubin 0.2 - 1.2 mg/dL 0.6 0.5 0.4  Bilirubin, Direct 0.0 - 0.3 mg/dL 0.1 - -    Lab Results  Component Value Date/Time   TSH 1.24 10/13/2020 03:05 PM   TSH 2.22 05/23/2020 03:00 PM   FREET4 0.70 04/28/2015 04:38 PM   FREET4 0.90 12/09/2014 04:45 PM    CBC Latest Ref Rng & Units 10/13/2020 05/23/2020 02/10/2020  WBC 4.0 - 10.5 K/uL 10.5 12.6(H) 8.5  Hemoglobin 12.0 - 15.0 g/dL 12.9 13.6 13.6  Hematocrit 36.0 - 46.0 % 38.9 41.4 42.1  Platelets 150.0 - 400.0 K/uL 223.0 239.0 205.0    Lab Results  Component Value Date/Time   VD25OH 30.23 02/10/2020 12:04 PM    Clinical ASCVD: No  The 10-year ASCVD risk score Mikey Bussing DC Jr., et al., 2013) is: 16.7%   Values used to calculate the score:     Age: 84 years     Sex: Female     Is Non-Hispanic African American: No     Diabetic: Yes     Tobacco smoker: No     Systolic Blood Pressure: 143 mmHg     Is BP treated: Yes     HDL Cholesterol: 47.1 mg/dL     Total Cholesterol: 138 mg/dL    Depression screen [facility] 2/9 01/02/2021 05/23/2020 02/10/2020  Decreased Interest 0 1 1  Down, Depressed, Hopeless 0 1 1  PHQ - 2 Score 0 2 2  Altered sleeping - 2 0  Tired, decreased energy - 1 1  Change in appetite - 1 1  Feeling bad or failure about yourself  - 2 1  Trouble concentrating - 1 0  Moving slowly or fidgety/restless - 0 0  Suicidal  thoughts - 0 0  PHQ-9 Score - 9 5  Difficult doing work/chores - Somewhat difficult Not difficult at all  Some recent data might be hidden    Social History   Tobacco Use  Smoking Status Never  Smokeless Tobacco Never   BP Readings from Last 3 Encounters:  10/13/20 126/84  05/23/20 112/68  02/10/20 118/62   Pulse Readings from Last 3 Encounters:  10/13/20 68  05/23/20 76  02/10/20 61   Wt Readings from Last 3 Encounters:  10/13/20 241 lb (109.3 kg)  05/23/20 228 lb (103.4 kg)  02/10/20 232 lb (105.2 kg)  BMI Readings from Last 3 Encounters:  10/13/20 45.54 kg/m  05/23/20 43.08 kg/m  02/10/20 43.84 kg/m    Assessment/Interventions: Review of patient past medical history, allergies, medications, health status, including review of consultants reports, laboratory and other test data, was performed as part of comprehensive evaluation and provision of chronic care management services.   SDOH:  (Social Determinants of Health) assessments and interventions performed: Yes  SDOH Screenings   Alcohol Screen: Low Risk    Last Alcohol Screening Score (AUDIT): 0  Depression (PHQ2-9): Low Risk    PHQ-2 Score: 0  Financial Resource Strain: Low Risk    Difficulty of Paying Living Expenses: Not hard at all  Food Insecurity: No Food Insecurity   Worried About Charity fundraiser in the Last Year: Never true   Ran Out of Food in the Last Year: Never true  Housing: Low Risk    Last Housing Risk Score: 0  Physical Activity: Inactive   Days of Exercise per Week: 0 days   Minutes of Exercise per Session: 0 min  Social Connections: Moderately Isolated   Frequency of Communication with Friends and Family: More than three times a week   Frequency of Social Gatherings with Friends and Family: Never   Attends Religious Services: Never   Marine scientist or Organizations: No   Attends Music therapist: Never   Marital Status: Married  Stress: No Stress Concern  Present   Feeling of Stress : Not at all  Tobacco Use: Low Risk    Smoking Tobacco Use: Never   Smokeless Tobacco Use: Never  Transportation Needs: No Transportation Needs   Lack of Transportation (Medical): No   Lack of Transportation (Non-Medical): No    CCM Care Plan  Allergies  Allergen Reactions   Jardiance [Empagliflozin] Other (See Comments)    Vaginal yeast infections   Oxycodone Hcl Anaphylaxis    REACTION: unspecified   Oxycodone-Acetaminophen Anaphylaxis    Patient states that she can regular tylenol without any problems   Sulfamethoxazole     REACTION: rash   Trulicity [Dulaglutide] Other (See Comments)    Upper GI pain   Codeine Phosphate Itching    REACTION: unspecified   Erythromycin Other (See Comments)    Dizzy and sweaty, vomiting, diarrhea    Metformin And Related Diarrhea   Morphine Sulfate Itching    Tolerated hydromorphone    Tylenol [Acetaminophen] Other (See Comments)    Fatty liver    Medications Reviewed Today     Reviewed by Tomasa Blase, [facility] (Pharmacist) on 07/17/21 at [place] List Status: <None>   Medication Order Taking? Sig Documenting Provider Last Dose Status Informant  albuterol (PROAIR HFA) 108 (90 Base) MCG/ACT inhaler [id number] No Inhale 2 puffs into the lungs every 6 (six) hours as needed.  Patient not taking: Reported on 07/17/2021   Janith Lima, MD Not Taking Active   aspirin 81 MG tablet [id number] Yes Take 81 mg by mouth daily.  [provider] Taking Active Self    Discontinued 03/19/12 [phone] (Therapy completed)   Blood Glucose Monitoring Suppl (CONTOUR NEXT ONE) KIT [id number] Yes 1 Act by Does not apply route 2 (two) times a day. Janith Lima, MD Taking Active     Discontinued 03/19/12 [phone] (Therapy completed)   calcium carbonate (TUMS - DOSED IN MG ELEMENTAL CALCIUM) 500 MG chewable tablet [id number] Yes Chew 1 tablet by mouth daily as needed for indigestion or heartburn. [provider] Taking  Active   cetirizine (ZYRTEC) 10 MG tablet [id number] Yes Take 10 mg by mouth daily.  [provider] Taking Active Self  Cholecalciferol (VITAMIN D3) 125 MCG (5000 UT) TBDP [id number] Yes Take 5,000 Units by mouth daily. [provider] Taking Active Self  cyanocobalamin 100 MCG tablet [id number] Yes Take 100 mcg by mouth daily.   [provider] Taking Active Self  dicyclomine (BENTYL) 10 MG capsule [id number] Yes Take 1 capsule (10 mg total) by mouth 3 (three) times daily before meals. Janith Lima, MD Taking Active     Discontinued 03/19/12 [phone] (Therapy completed)   fluticasone (FLONASE) 50 MCG/ACT nasal spray [id number] Yes Place 1 spray into both nostrils daily. [provider] Taking Active   gabapentin (NEURONTIN) 100 MG capsule [id number] Yes Take 100 mg by mouth 2 (two) times daily. [provider] Taking Active   Glucagon (GVOKE HYPOPEN 2-PACK) 1 MG/0.2ML SOAJ [id number] No Inject 1 Act into the skin daily as needed.  Patient not taking: Reported on 07/17/2021   Janith Lima, MD Not Taking Active   HUMALOG KWIKPEN 100 UNIT/ML KwikPen [id number] Yes INJECT SUBCUTANEOUSLY 16  UNITS 3 TIMES DAILY Janith Lima, MD Taking Active   insulin glargine, 1 Unit Dial, (TOUJEO SOLOSTAR) 300 UNIT/ML Solostar Pen [id number] Yes Inject 60 Units into the skin daily. Janith Lima, MD Taking Active   Insulin Pen Needle (INSUPEN PEN NEEDLES) 33G X 4 MM MISC [id number] Yes Inject 1 Act into the skin 4 (four) times daily - after meals and at bedtime. Janith Lima, MD Taking Active   ipratropium (ATROVENT HFA) 17 MCG/ACT inhaler [id number] Yes USE 2 PUFFS EVERY 4 HOURS  AS NEEDED FOR WHEEZING. Janith Lima, MD Taking Active   Lancets (ONETOUCH DELICA PLUS RTMYTR17B) [place] [id number] Yes USE AS DIRECTED TO CHECK BLOOD SUGAR 4 TIMES DAILY AFTER MEALS AND AT BEDTIME. Janith Lima, MD Taking Active   lisinopril (ZESTRIL) 20 MG tablet [id number] Yes TAKE 1 TABLET BY  MOUTH  DAILY Janith Lima, MD Taking Active   loperamide (IMODIUM) 2 MG capsule [id number] Yes Take by mouth as needed for diarrhea or loose stools. [provider] Taking Active   LORazepam (ATIVAN) 1 MG tablet [id number] Yes Take 1 mg by mouth every 8 (eight) hours as needed for anxiety. [provider] Taking Active   metoprolol tartrate (LOPRESSOR) 25 MG tablet [id number] Yes TAKE 1 TABLET BY MOUTH  TWICE DAILY Janith Lima, MD Taking Active   omeprazole (PRILOSEC) 20 MG capsule [id number] Yes Take 20 mg by mouth daily as needed. [provider] Taking Active   [facility] VERIO test strip [id number] Yes CHECK BLOOD SUGAR 4 TIMES A DAY. Janith Lima, MD Taking Active   rosuvastatin (CRESTOR) 20 MG tablet [id number] Yes Take 1 tablet (20 mg total) by mouth daily. Janith Lima, MD Taking Active   sertraline (ZOLOFT) 100 MG tablet [id number] Yes Take 200 mg by mouth daily. [provider] Taking Active   Simethicone 125 MG CAPS [id number] Yes Take 1 capsule by mouth daily as needed. [provider] Taking Active   SYNTHROID 75 MCG tablet [id number] Yes TAKE 1 TABLET DAILY BEFORE BREAKFAST FOR HYPOTHYROIDISM Janith Lima, MD Taking Active     Discontinued 03/19/12 0827 (Therapy completed)   zolpidem (AMBIEN) 5 MG tablet [id number] Yes Take 5 mg by mouth at bedtime. [provider] Taking Active  Patient Active Problem List   Diagnosis Date Noted   Periumbilical abdominal tenderness without rebound tenderness 05/24/2020   Leukocytosis 05/24/2020   Irritable bowel syndrome with diarrhea 05/23/2020   Incontinence of feces 05/23/2020   Urinary incontinence 05/23/2020   Pure hypertriglyceridemia 07/28/2019   Routine general medical examination at a health care facility 01/07/2019   Irritable bowel syndrome with constipation 01/07/2019   Chronic renal disease, stage 3, moderately decreased glomerular filtration rate (GFR)  between 30-59 mL/min/1.73 square meter ([place]) 12/18/2017   Medicare annual wellness visit, subsequent 03/06/2016   Visit for screening mammogram 03/06/2016   Diabetes mellitus with renal manifestations, uncontrolled ([place]) 02/21/2016   Fatty liver disease, nonalcoholic 04/54/0981   PSVT (paroxysmal supraventricular tachycardia) ([place])    IRRITABLE BOWEL SYNDROME 03/05/2008   DISORDER, BIPOLAR NEC 09/19/2007   SLEEP APNEA 09/19/2007   Hypothyroidism 08/26/2007   OSTEOARTHRITIS 08/26/2007   Hyperlipidemia LDL goal <130 10/31/2006   OBESITY, MORBID 10/31/2006   Depression 10/31/2006   Essential hypertension 10/31/2006   ALLERGIC RHINITIS 10/31/2006   ASTHMA 10/31/2006    Immunization History  Administered Date(s) Administered   Fluad Quad(high Dose 65+) 10/21/2019, 10/13/2020   Hepatitis A, Adult 03/01/2015   Influenza Split 09/19/2011   Influenza Whole 12/08/2007, 10/07/2009, 10/13/2010   Influenza, High Dose Seasonal PF 10/12/2016, 09/05/2017, 11/25/2018   Influenza,inj,Quad PF,6+ Mos 12/09/2013, 09/30/2015   Influenza-Unspecified 11/26/2014   Pneumococcal Conjugate-13 01/01/2018   Pneumococcal Polysaccharide-23 09/19/2011, 01/07/2019   Td 09/19/2007   Tdap 05/19/2018   Zoster, Live 07/07/2013    Conditions to be addressed/monitored:  Hypertension, Hyperlipidemia, Diabetes, Asthma, Chronic Kidney Disease, Hypothyroidism, Depression, and Allergic Rhinitis  Care Plan : CCM Care Plan  Updates made by Tomasa Blase, RPH since 07/17/2021 12:00 AM     Problem: HTN, HLD, DM2, IBS, CKD, Asthma, Hypothyroidism, Allergic Rhinitis, Depression, Anxiety, Bipolar, and Insomnia   Priority: High  Onset Date: 07/17/2021     Goal: Disease Management   Start Date: 07/17/2021  Expected End Date: 01/17/2022  This Visit's Progress: On track  Priority: High  Note:   Current Barriers:  Unable to independently monitor therapeutic efficacy  Pharmacist Clinical Goal(s):  Patient will achieve  adherence to monitoring guidelines and medication adherence to achieve therapeutic efficacy maintain control of BP, LDL, and A1c as evidenced by blood pressure in office, and next lab results  contact provider office for questions/concerns as evidenced notation of same in electronic health record through collaboration with PharmD and provider.   Interventions: 1:1 collaboration with Janith Lima, MD regarding development and update of comprehensive plan of care as evidenced by provider attestation and co-signature Inter-disciplinary care team collaboration (see longitudinal plan of care) Comprehensive medication review performed; medication list updated in electronic medical record  Hypertension (BP goal <130/80) -Controlled -Current treatment: Lisinopril 21m daily  Metoprolol tartrate 265m- 1 tablet twice daily  -Medications previously tried: atenolol,  hctz -Current home readings: not currently checking at home, most recent office visits 126/84 -68, 112/68-79, 118/62-61 -Current dietary habits: reports that she eats a sodium reduced diet, very cognizant of her diet  -Current exercise habits: reports that she does not have any scheduled exercise or activity  -Denies hypotensive/hypertensive symptoms -Educated on BP goals and benefits of medications for prevention of heart attack, stroke and kidney damage; Daily salt intake goal < 2300 mg; Exercise goal of 150 minutes per week; Importance of home blood pressure monitoring; -Counseled on diet and exercise extensively Recommended to continue current medication  Hyperlipidemia: (  LDL goal < 70) -Controlled Last LDL 51m/dL (02/10/2020) -Current treatment: Rosuvastatin 2100mdaily  Aspirin 8174maily  -Medications previously tried: atorvastatin, vascepa   -Current dietary patterns: reports that she does not eat any fried foods, tries to limit red meat intake  -Current exercise habits: none at this time  -Educated on Cholesterol  goals;  Benefits of statin for ASCVD risk reduction; Importance of limiting foods high in cholesterol; Exercise goal of 150 minutes per week; -Counseled on diet and exercise extensively Recommended to continue current medication  Diabetes (A1c goal <7%) -Controlled -Last A1c 7.0% (10/13/2020) -Current medications: Toujeo - 60 units daily  Humalog - 16 units 3 times daily  Gvoke hypopen - 1mg17m2mL - inject 1 into skin as needed  -Medications previously tried: invokana, onglyza, trulicity, glipizide XL, glyburide, lantus, metformin, metformin XR, januvia,   -Current home glucose readings fasting glucose: 120-150 when she checks  -Reports hypoglycemic/ episode last week - BG was ~38 - very reports that she can becomes very symptomatic, first time in a number of months - notes that she did not use gvoke pen, used candy instead, reviewed appropriate treatment for hypoglycemia  -Current meal patterns:  breakfast: cup of tea +/- a slice of toast   lunch: sandwich, peanutbutter on crackers on occasion  dinner: protein, vegetables, carb snacks: does not eat after supper (due to reflux) - may be a few cookies with tea / a few crackers / apple  drinks: tea, kombucha  -Current exercise: none at this time  -Educated on A1c and blood sugar goals; Complications of diabetes including kidney damage, retinal damage, and cardiovascular disease; Exercise goal of 150 minutes per week; Benefits of weight loss; Proper insulin injection technique; Prevention and management of hypoglycemic episodes; Continuous glucose monitoring; Carbohydrate counting and/or plate method -Counseled to check feet daily and get yearly eye exams -Counseled on diet and exercise extensively Recommended to continue current medication - Discussed with patient about importance of checking blood sugars prior to each meal - should qualify for CGM, will have libre meter and supplies sent to pharmacy - likely patient would benefit  from sliding scale for humalog, will require patient to be checking blood sugars to be able to develop for her.  Asthma (Goal: control symptoms and prevent exacerbations) -Controlled -Current treatment  Ipratropium (Atrovent) 17mc58mt - 2 puffs every 4 hours as needed  -Medications previously tried: albuterol   -Exacerbations requiring treatment in last 6 months: denies  -Frequency of rescue inhaler use: 1 time per week -Counseled on Proper inhaler technique; When to use rescue inhaler -Recommended to continue current medication  Depression/Anxiety/ Bipolar Disorder/ Insomnia (Goal: Mood control / prevention of anxiety attacks ) - Managed by Dr. RupinChucky Mayntrolled -Current treatment: Sertraline 100mg 22mtablets daily  Lorazepam 1mg - 3mablet 3 times daily  Gabapentin 100mg - 20mg twi76maily  Zolpidem 5mg - 1 t51met nightly  -Medications previously tried/failed: desvenlafaxine, tempazepam, lamotrigine, carbamazepine, trintellix, alprazolam -Follows with Dr. Rupinder KChucky Mayl health support -Educated on Benefits of medication for symptom control Benefits of cognitive-behavioral therapy with or without medication -Recommended for patient to continuing to follow with psych provider for management  Hypothyroidism (Goal: Maintenance of euthyroid levels) -Controlled -Last TSH level 1.24uIU/mL -Current treatment  Synthroid 75mcg - 1 51met daily  -Medications previously tried: levothyroxine  -Recommended to continue current medication  Chronic Kidney Disease (Goal: prevention of disease progression ) -Controlled -Last eGFR: 38.83 mL/min -Last CrCl: 66.09 mL/min -Current treatment  Avoidance of nephrotoxic drugs, promotion of blood sugar control  -Counseled on diet and exercise extensively Recommended to continue current medication  Allergic Rhinitis (Goal: prevention of allergies / treatment of symptoms) -Controlled -Current treatment  Cetirizine 38m - 1  tablet daily  Flonase 50 mcg/act - 1 spray into each nostril daily  -Medications previously tried: flonase   -Recommended to continue current medication  Irritable Bowel Syndrom (Goal: prevention of bowel issues) -Controlled -Current treatment  Simethicone 1267m- 1 capsule daily as needed  Omeprazole 2039m 1 capsule daily as needed  Loperamide 2mg19m1 table daily as needed  Dicyclomine 10mg92m capsule 3 times daily as needed before meals  Tums 500mg 50mtablet as needed  -Medications previously tried: cimetidine, pylera, ranitidine, lomotil   -Counseled on diet and exercise extensively Recommended to continue current medication  Health Maintenance -Vaccine gaps: COVID Booster, Shingles vaccine -Current therapy:  Cyanocobalamin 125mcg 48mtablet daily  Vitamin D3 5000 units - 1 capsule daily  -Educated on Cost vs benefit of each product must be carefully weighed by individual consumer -Patient is satisfied with current therapy and denies issues -Recommended to continue current medication  Patient Goals/Self-Care Activities Patient will:  - take medications as prescribed check glucose before each meal and at bedtime, document, and provide at future appointments collaborate with provider on medication access solutions target a minimum of 150 minutes of moderate intensity exercise weekly engage in dietary modifications by moderation of carb intake   Follow Up Plan: Face to Face appointment with care management team member scheduled for:  1 week The patient has been provided with contact information for the care management team and has been advised to call with any health related questions or concerns.  Next PCP appointment scheduled for: 1 week       Medication Assistance: None required.  Patient affirms current coverage meets needs.  Compliance/Adherence/Medication fill history: Care Gaps: Foot and Eye Exam, A1c   [facility]  [id number] [address][id number] [phone] Mail Service  ([facility] [address] [address] [place] [phone] [facility] [address] [place] [phone] pill box? No - able to manage without  Pt endorses 100% compliance  Care Plan and Follow Up Patient Decision:  Patient agrees to Care Plan and Follow-up.  Plan: Face to Face appointment with care management team member scheduled for: 07/24/2021, The patient has been provided with contact information for the care management team and has been advised to call with any health related questions or concerns. , and Next PCP appointment scheduled for: 07/24/2021  Despina Boan Tomasa BlaseD Clinical Pharmacist, [place]

## 2021-07-18 ENCOUNTER — Telehealth: Payer: Self-pay

## 2021-07-18 NOTE — Chronic Care Management (AMB) (Signed)
Called pharmacy for follow up on freestyle [place] 2 system that was sent 7/18/20222 - pharmacy unable to fill prescriptions, sent orders to [place] via parachute  Follow up in 2 days for status of order   Tomasa Blase, PharmD Clinical Pharmacist, [facility]

## 2021-07-20 ENCOUNTER — Other Ambulatory Visit: Payer: Self-pay | Admitting: Internal Medicine

## 2021-07-20 DIAGNOSIS — IMO0002 Reserved for concepts with insufficient information to code with codable children: Secondary | ICD-10-CM

## 2021-07-20 DIAGNOSIS — E11319 Type 2 diabetes mellitus with unspecified diabetic retinopathy without macular edema: Secondary | ICD-10-CM

## 2021-07-20 DIAGNOSIS — E1129 Type 2 diabetes mellitus with other diabetic kidney complication: Secondary | ICD-10-CM

## 2021-07-24 ENCOUNTER — Ambulatory Visit: Payer: Medicare Other | Admitting: Internal Medicine

## 2021-08-10 ENCOUNTER — Other Ambulatory Visit: Payer: Self-pay | Admitting: Internal Medicine

## 2021-08-10 DIAGNOSIS — E1129 Type 2 diabetes mellitus with other diabetic kidney complication: Secondary | ICD-10-CM

## 2021-08-10 DIAGNOSIS — E11319 Type 2 diabetes mellitus with unspecified diabetic retinopathy without macular edema: Secondary | ICD-10-CM

## 2021-08-10 DIAGNOSIS — IMO0002 Reserved for concepts with insufficient information to code with codable children: Secondary | ICD-10-CM

## 2021-08-17 ENCOUNTER — Other Ambulatory Visit: Payer: Self-pay | Admitting: Internal Medicine

## 2021-08-17 DIAGNOSIS — I10 Essential (primary) hypertension: Secondary | ICD-10-CM

## 2021-08-18 ENCOUNTER — Other Ambulatory Visit: Payer: Self-pay | Admitting: Internal Medicine

## 2021-08-18 DIAGNOSIS — E11319 Type 2 diabetes mellitus with unspecified diabetic retinopathy without macular edema: Secondary | ICD-10-CM

## 2021-08-18 DIAGNOSIS — E1129 Type 2 diabetes mellitus with other diabetic kidney complication: Secondary | ICD-10-CM

## 2021-08-18 DIAGNOSIS — IMO0002 Reserved for concepts with insufficient information to code with codable children: Secondary | ICD-10-CM

## 2021-08-21 ENCOUNTER — Ambulatory Visit (INDEPENDENT_AMBULATORY_CARE_PROVIDER_SITE_OTHER): Payer: Medicare Other | Admitting: Internal Medicine

## 2021-08-21 ENCOUNTER — Other Ambulatory Visit: Payer: Self-pay

## 2021-08-21 ENCOUNTER — Encounter: Payer: Self-pay | Admitting: Internal Medicine

## 2021-08-21 VITALS — BP 122/68 | HR 73 | Temp 98.3°F | Resp 16 | Ht 61.0 in | Wt 238.0 lb

## 2021-08-21 DIAGNOSIS — I10 Essential (primary) hypertension: Secondary | ICD-10-CM

## 2021-08-21 DIAGNOSIS — E039 Hypothyroidism, unspecified: Secondary | ICD-10-CM | POA: Diagnosis not present

## 2021-08-21 DIAGNOSIS — Z0001 Encounter for general adult medical examination with abnormal findings: Secondary | ICD-10-CM | POA: Diagnosis not present

## 2021-08-21 DIAGNOSIS — E1165 Type 2 diabetes mellitus with hyperglycemia: Secondary | ICD-10-CM

## 2021-08-21 DIAGNOSIS — E119 Type 2 diabetes mellitus without complications: Secondary | ICD-10-CM

## 2021-08-21 DIAGNOSIS — E11319 Type 2 diabetes mellitus with unspecified diabetic retinopathy without macular edema: Secondary | ICD-10-CM

## 2021-08-21 DIAGNOSIS — I471 Supraventricular tachycardia: Secondary | ICD-10-CM | POA: Diagnosis not present

## 2021-08-21 DIAGNOSIS — E785 Hyperlipidemia, unspecified: Secondary | ICD-10-CM | POA: Diagnosis not present

## 2021-08-21 DIAGNOSIS — IMO0002 Reserved for concepts with insufficient information to code with codable children: Secondary | ICD-10-CM

## 2021-08-21 DIAGNOSIS — R9431 Abnormal electrocardiogram [ECG] [EKG]: Secondary | ICD-10-CM

## 2021-08-21 DIAGNOSIS — E1129 Type 2 diabetes mellitus with other diabetic kidney complication: Secondary | ICD-10-CM | POA: Diagnosis not present

## 2021-08-21 DIAGNOSIS — N1831 Chronic kidney disease, stage 3a: Secondary | ICD-10-CM | POA: Diagnosis not present

## 2021-08-21 DIAGNOSIS — Z1231 Encounter for screening mammogram for malignant neoplasm of breast: Secondary | ICD-10-CM | POA: Insufficient documentation

## 2021-08-21 DIAGNOSIS — Z794 Long term (current) use of insulin: Secondary | ICD-10-CM

## 2021-08-21 LAB — CBC WITH DIFFERENTIAL/PLATELET
Basophils Absolute: 0 K/uL (ref 0.0–0.1)
Basophils Relative: 0.4 % (ref 0.0–3.0)
Eosinophils Absolute: 0.3 K/uL (ref 0.0–0.7)
Eosinophils Relative: 3.3 % (ref 0.0–5.0)
HCT: 41.1 % (ref 36.0–46.0)
Hemoglobin: 13.7 g/dL (ref 12.0–15.0)
Lymphocytes Relative: 32.5 % (ref 12.0–46.0)
Lymphs Abs: 3 K/uL (ref 0.7–4.0)
MCHC: 33.3 g/dL (ref 30.0–36.0)
MCV: 92.2 fl (ref 78.0–100.0)
Monocytes Absolute: 0.8 K/uL (ref 0.1–1.0)
Monocytes Relative: 8.8 % (ref 3.0–12.0)
Neutro Abs: 5.1 K/uL (ref 1.4–7.7)
Neutrophils Relative %: 55 % (ref 43.0–77.0)
Platelets: 197 K/uL (ref 150.0–400.0)
RBC: 4.45 Mil/uL (ref 3.87–5.11)
RDW: 13.6 % (ref 11.5–15.5)
WBC: 9.3 K/uL (ref 4.0–10.5)

## 2021-08-21 LAB — HEPATIC FUNCTION PANEL
ALT: 22 U/L (ref 0–35)
AST: 31 U/L (ref 0–37)
Albumin: 4.2 g/dL (ref 3.5–5.2)
Alkaline Phosphatase: 71 U/L (ref 39–117)
Bilirubin, Direct: 0.1 mg/dL (ref 0.0–0.3)
Total Bilirubin: 0.5 mg/dL (ref 0.2–1.2)
Total Protein: 7.1 g/dL (ref 6.0–8.3)

## 2021-08-21 LAB — BASIC METABOLIC PANEL
BUN: 16 mg/dL (ref 6–23)
CO2: 28 mEq/L (ref 19–32)
Calcium: 9.3 mg/dL (ref 8.4–10.5)
Chloride: 101 mEq/L (ref 96–112)
Creatinine, Ser: 1.64 mg/dL — ABNORMAL HIGH (ref 0.40–1.20)
GFR: 31.88 mL/min — ABNORMAL LOW (ref >60.00)
Glucose, Bld: 109 mg/dL — ABNORMAL HIGH (ref 70–99)
Potassium: 4 mEq/L (ref 3.5–5.1)
Sodium: 139 mEq/L (ref 135–145)

## 2021-08-21 LAB — BRAIN NATRIURETIC PEPTIDE: Pro B Natriuretic peptide (BNP): 35 pg/mL (ref 0.0–100.0)

## 2021-08-21 LAB — TSH: TSH: 1.03 u[IU]/mL (ref 0.35–5.50)

## 2021-08-21 LAB — TROPONIN I (HIGH SENSITIVITY): High Sens Troponin I: 4 ng/L (ref 2–17)

## 2021-08-21 LAB — LIPID PANEL
Cholesterol: 184 mg/dL (ref 0–200)
HDL: 40.5 mg/dL (ref >39.00)
NonHDL: 143.46
Total CHOL/HDL Ratio: 5
Triglycerides: 266 mg/dL — ABNORMAL HIGH (ref 0.0–149.0)
VLDL: 53.2 mg/dL — ABNORMAL HIGH (ref 0.0–40.0)

## 2021-08-21 LAB — LDL CHOLESTEROL, DIRECT: Direct LDL: 122 mg/dL

## 2021-08-21 NOTE — Progress Notes (Signed)
Subjective:  Patient ID: Meredith Hayden, female    DOB: 1950/10/02  Age: 71 y.o. MRN: [id number]  CC: Annual Exam, Diabetes, and Hyperlipidemia  This visit occurred during the SARS-CoV-2 public health emergency.  Safety protocols were in place, including screening questions prior to the visit, additional usage of staff PPE, and extensive cleaning of exam room while observing appropriate contact time as indicated for disinfecting solutions.    HPI Kassey Laforest presents for a CPX and f/up -  A month ago she was alerted by her apple watch that her heart rate was in the 40s.  When the heart rate is low she has a heaviness and achy sensation in her chest with shortness of breath.  She has had a few palpitations but denies syncope, near syncope, dyspnea on exertion, edema, dizziness, or lightheadedness.  Outpatient Medications Prior to Visit  Medication Sig Dispense Refill   aspirin 81 MG tablet Take 81 mg by mouth daily.      calcium carbonate (TUMS - DOSED IN MG ELEMENTAL CALCIUM) 500 MG chewable tablet Chew 1 tablet by mouth daily as needed for indigestion or heartburn.     cetirizine (ZYRTEC) 10 MG tablet Take 10 mg by mouth daily.      Cholecalciferol (VITAMIN D3) 125 MCG (5000 UT) TBDP Take 5,000 Units by mouth daily.     Continuous Blood Gluc Receiver (FREESTYLE LIBRE 2 READER) DEVI 1 Act by Does not apply route daily. 2 each 5   Continuous Blood Gluc Sensor (FREESTYLE LIBRE 2 SENSOR) MISC 1 Act by Does not apply route daily. 2 each 5   cyanocobalamin 100 MCG tablet Take 100 mcg by mouth daily.       fluticasone (FLONASE) 50 MCG/ACT nasal spray Place 1 spray into both nostrils daily.     gabapentin (NEURONTIN) 100 MG capsule Take 100 mg by mouth 2 (two) times daily.     Glucagon (GVOKE HYPOPEN 2-PACK) 1 MG/0.2ML SOAJ Inject 1 Act into the skin daily as needed. 2 mL 5   HUMALOG KWIKPEN 100 UNIT/ML KwikPen INJECT SUBCUTANEOUSLY 16  UNITS 3 TIMES DAILY 15 mL 5   Insulin Pen  Needle (INSUPEN PEN NEEDLES) 33G X 4 MM MISC Inject 1 Act into the skin 4 (four) times daily - after meals and at bedtime. 300 each 5   ipratropium (ATROVENT HFA) 17 MCG/ACT inhaler USE 2 PUFFS EVERY 4 HOURS  AS NEEDED FOR WHEEZING. 12.9 g 3   Lancets (ONETOUCH DELICA PLUS [id number]) MISC USE AS DIRECTED TO CHECK BLOOD SUGAR 4 TIMES DAILY AFTER MEALS AND AT BEDTIME. 200 each 4   lisinopril (ZESTRIL) 20 MG tablet TAKE 1 TABLET BY MOUTH  DAILY 90 tablet 0   loperamide (IMODIUM) 2 MG capsule Take by mouth as needed for diarrhea or loose stools.     LORazepam (ATIVAN) 1 MG tablet Take 1 mg by mouth every 8 (eight) hours as needed for anxiety.     metoprolol tartrate (LOPRESSOR) 25 MG tablet TAKE 1 TABLET BY MOUTH  TWICE DAILY 180 tablet 0   omeprazole (PRILOSEC) 20 MG capsule Take 20 mg by mouth daily as needed.     ONETOUCH VERIO test strip CHECK BLOOD SUGAR 4 TIMES A DAY. 200 strip 5   rosuvastatin (CRESTOR) 20 MG tablet Take 1 tablet (20 mg total) by mouth daily. 90 tablet 1   sertraline (ZOLOFT) 100 MG tablet Take 200 mg by mouth daily.     Simethicone 125 MG CAPS Take 1  capsule by mouth daily as needed.     SYNTHROID 75 MCG tablet TAKE 1 TABLET DAILY BEFORE BREAKFAST FOR HYPOTHYROIDISM 90 tablet 1   zolpidem (AMBIEN) 5 MG tablet Take 5 mg by mouth at bedtime.     albuterol (PROAIR HFA) 108 (90 Base) MCG/ACT inhaler Inhale 2 puffs into the lungs every 6 (six) hours as needed. 18 g 5   Blood Glucose Monitoring Suppl (CONTOUR NEXT ONE) KIT 1 Act by Does not apply route 2 (two) times a day. 2 kit 0   dicyclomine (BENTYL) 10 MG capsule Take 1 capsule (10 mg total) by mouth 3 (three) times daily before meals. 90 capsule 3   TOUJEO SOLOSTAR 300 UNIT/ML Solostar Pen ADMINISTER 60 UNITS UNDER THE SKIN DAILY 4.5 mL 0   No facility-administered medications prior to visit.    ROS Review of Systems  Constitutional:  Negative for diaphoresis and fatigue.  HENT: Negative.    Eyes: Negative.    Respiratory:  Positive for shortness of breath. Negative for cough, chest tightness and wheezing.   Cardiovascular:  Positive for chest pain and palpitations. Negative for leg swelling.  Gastrointestinal:  Negative for abdominal pain, diarrhea, nausea and vomiting.  Endocrine: Negative.  Negative for polyuria.  Genitourinary: Negative.  Negative for difficulty urinating.  Musculoskeletal: Negative.  Negative for myalgias.  Skin: Negative.   Neurological:  Negative for dizziness, weakness, light-headedness, numbness and headaches.  Hematological:  Negative for adenopathy. Does not bruise/bleed easily.  Psychiatric/Behavioral: Negative.     Objective:  BP 122/68 (BP Location: Left Arm, Patient Position: Sitting, Cuff Size: Large)   Pulse 73   Temp 98.3 F (36.8 C) (Oral)   Resp 16   Ht '5\' 1"'  (1.549 m)   Wt 238 lb (108 kg)   LMP 12/31/1993   SpO2 95%   BMI 44.97 kg/m   BP Readings from Last 3 Encounters:  08/21/21 122/68  10/13/20 126/84  05/23/20 112/68    Wt Readings from Last 3 Encounters:  08/21/21 238 lb (108 kg)  10/13/20 241 lb (109.3 kg)  05/23/20 228 lb (103.4 kg)    Physical Exam Vitals reviewed.  Constitutional:      Appearance: She is obese.  HENT:     Nose: Nose normal.     Mouth/Throat:     Mouth: Mucous membranes are moist.  Eyes:     Conjunctiva/sclera: Conjunctivae normal.  Cardiovascular:     Rate and Rhythm: Normal rate and regular rhythm.     Heart sounds: Normal heart sounds, S1 normal and S2 normal. No murmur heard.   No gallop.     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 No wheezing, rhonchi or rales.  Abdominal:     General: Abdomen is flat.     Palpations: There is no mass.     Tenderness: There is no abdominal tenderness. There is no guarding.     Hernia: No hernia is present.  Musculoskeletal:     Cervical back: Neck supple.      Right lower leg: No edema.     Left lower leg: No edema.  Neurological:     Mental Status: She is alert.    Lab Results  Component Value Date   WBC 9.3 08/21/2021   HGB 13.7 08/21/2021   HCT 41.1 08/21/2021   PLT 197.0 08/21/2021   GLUCOSE 109 (  H) 08/21/2021   CHOL 184 08/21/2021   TRIG 266.0 (H) 08/21/2021   HDL 40.50 08/21/2021   LDLDIRECT 122.0 08/21/2021   LDLCALC 60 02/10/2020   ALT 22 08/21/2021   AST 31 08/21/2021   NA 139 08/21/2021   K 4.0 08/21/2021   CL 101 08/21/2021   CREATININE 1.64 (H) 08/21/2021   BUN 16 08/21/2021   CO2 28 08/21/2021   TSH 1.03 08/21/2021   INR 1.0 02/10/2020   HGBA1C 7.8 (H) 08/21/2021   MICROALBUR 0.9 08/21/2021    CT Abdomen Pelvis W Contrast  Result Date: 05/25/2020 CLINICAL DATA:  decreased appetite 10 pound weight loss abdominal tenderness and leukocytosis EXAM: CT ABDOMEN AND PELVIS WITH CONTRAST TECHNIQUE: Multidetector CT imaging of the abdomen and pelvis was performed using the standard protocol following bolus administration of intravenous contrast. CONTRAST:  56m OMNIPAQUE IOHEXOL 300 MG/ML  SOLN COMPARISON:  None. FINDINGS: Lower chest: The visualized heart size within normal limits. No pericardial fluid/thickening. No hiatal hernia. The visualized portions of the lungs are clear. Hepatobiliary: There is diffuse low density seen throughout the liver parenchyma. Main portal vein is patent. No evidence of calcified gallstones, gallbladder wall thickening or biliary dilatation. Pancreas: Unremarkable. No pancreatic ductal dilatation or surrounding inflammatory changes. Spleen: Normal in size without focal abnormality. Adrenals/Urinary Tract: Both adrenal glands appear normal. The kidneys and collecting system appear normal without evidence of urinary tract calculus or hydronephrosis. Bladder is unremarkable. Stomach/Bowel: Stomach and small bowel are normal in appearance. There is a moderate to large amount of colonic stool. The patient  is status post partial colectomy with anastomosis at the sigmoid colon. Vascular/Lymphatic: There are no enlarged mesenteric, retroperitoneal, or pelvic lymph nodes. Scattered aortic atherosclerotic calcifications are seen without aneurysmal dilatation. Reproductive: The patient is status post hysterectomy. Other: A small fat containing upper abdominal wall hernia is noted. Musculoskeletal: No acute or significant osseous findings. IMPRESSION: No acute intra-abdominal or pelvic pathology to explain the patient's symptoms. Aortic Atherosclerosis (ICD10-I70.0). Electronically Signed   By: BPrudencio PairM.D.   On: 05/25/2020 14:45    Assessment & Plan:   JOluchiwas seen today for annual exam, diabetes and hyperlipidemia.  Diagnoses and all orders for this visit:  Essential hypertension-her blood pressure is well controlled. -     CBC with Differential/Platelet; Future -     Hepatic function panel; Future -     Urinalysis, Routine w reflex microscopic; Future -     EKG 12-Lead -     Urinalysis, Routine w reflex microscopic -     Hepatic function panel -     CBC with Differential/Platelet  PSVT (paroxysmal supraventricular tachycardia) (HCC)-she has had no episodes of tachycardia. -     TSH; Future -     TSH  Insulin-requiring or dependent type II diabetes mellitus (HCC)-she agrees to improve her lifestyle modifications. -     Basic metabolic panel; Future -     Microalbumin / creatinine urine ratio; Future -     Hemoglobin A1c; Future -     Urinalysis, Routine w reflex microscopic; Future -     HM Diabetes Foot Exam -     Urinalysis, Routine w reflex microscopic -     Hemoglobin A1c -     Microalbumin / creatinine urine ratio -     Basic metabolic panel -     insulin glargine, 1 Unit Dial, (TOUJEO SOLOSTAR) 300 UNIT/ML Solostar Pen; ADMINISTER 60 UNITS UNDER THE SKIN DAILY -  Discontinue: Finerenone (KERENDIA) 10 MG TABS; Take 1 tablet by mouth daily.  Stage 3a chronic kidney disease  (HCC)-she will not take [place].  I recommended that she add kerendia for renal protection. -     Basic metabolic panel; Future -     Microalbumin / creatinine urine ratio; Future -     Urinalysis, Routine w reflex microscopic; Future -     Urinalysis, Routine w reflex microscopic -     Microalbumin / creatinine urine ratio -     Basic metabolic panel -     Discontinue: Finerenone (KERENDIA) 10 MG TABS; Take 1 tablet by mouth daily.  Hyperlipidemia LDL goal <130- LDL goal achieved. Doing well on the statin  -     Lipid panel; Future -     TSH; Future -     TSH -     Lipid panel  Encounter for general adult medical examination with abnormal findings- Exam completed, labs reviewed, vaccines reviewed and updated, cancer screenings addressed, patient education was given.  Visit for screening mammogram -     MM DIGITAL SCREENING BILATERAL; Future  Acquired hypothyroidism- Her TSH is normal.  Diabetes mellitus with renal manifestations, uncontrolled (HCC) -     insulin glargine, 1 Unit Dial, (TOUJEO SOLOSTAR) 300 UNIT/ML Solostar Pen; ADMINISTER 60 UNITS UNDER THE SKIN DAILY  Abnormal electrocardiogram (ECG) (EKG)- Will screen for ischemia.  If the ischemia work-up is negative then will order a cardiac event monitor. -     MYOCARDIAL PERFUSION IMAGING; Future -     Troponin I (High Sensitivity); Future -     Brain natriuretic peptide; Future -     Cardiac Stress Test: Informed Consent Details: Physician/Practitioner Attestation; Transcribe to consent form and obtain patient signature; Future -     Brain natriuretic peptide -     Troponin I (High Sensitivity)  Type 2 diabetes mellitus with retinopathy of both eyes, without long-term current use of insulin, macular edema presence unspecified, unspecified retinopathy severity (HCC) -     insulin glargine, 1 Unit Dial, (TOUJEO SOLOSTAR) 300 UNIT/ML Solostar Pen; ADMINISTER 60 UNITS UNDER THE SKIN DAILY  Other orders -     LDL cholesterol,  direct  I have discontinued Aldine Contes "Chris"'s albuterol, Contour Next One, dicyclomine, and Kerendia. I have also changed her [employer]. Additionally, I am having her maintain her aspirin, cetirizine, cyanocobalamin, Vitamin D3, gabapentin, ipratropium, OneTouch Delica Plus [id number], OneTouch Verio, Gvoke HypoPen 2-Pack, Synthroid, Insupen Pen Needles, HumaLOG KwikPen, rosuvastatin, lisinopril, sertraline, LORazepam, loperamide, Simethicone, omeprazole, calcium carbonate, fluticasone, zolpidem, FreeStyle Libre 2 Sensor, [employer] 2 Reader, and metoprolol tartrate.  Meds ordered this encounter  Medications   insulin glargine, 1 Unit Dial, (TOUJEO SOLOSTAR) 300 UNIT/ML Solostar Pen    Sig: ADMINISTER 60 UNITS UNDER THE SKIN DAILY    Dispense:  4.5 mL    Refill:  1   DISCONTD: Finerenone (KERENDIA) 10 MG TABS    Sig: Take 1 tablet by mouth daily.    Dispense:  30 tablet    Refill:  0      Follow-up: Return in about 3 months (around 11/21/2021).  Scarlette Calico, MD

## 2021-08-21 NOTE — Patient Instructions (Signed)
Health Maintenance, Female Adopting a healthy lifestyle and getting preventive care are important in promoting health and wellness. Ask your health care provider about: The right schedule for you to have regular tests and exams. Things you can do on your own to prevent diseases and keep yourself healthy. What should I know about diet, weight, and exercise? Eat a healthy diet  Eat a diet that includes plenty of vegetables, fruits, low-fat dairy products, and lean protein. Do not eat a lot of foods that are high in solid fats, added sugars, or sodium.  Maintain a healthy weight Body mass index (BMI) is used to identify weight problems. It estimates body fat based on height and weight. Your health care provider can help determineyour BMI and help you achieve or maintain a healthy weight. Get regular exercise Get regular exercise. This is one of the most important things you can do for your health. Most adults should: Exercise for at least 150 minutes each week. The exercise should increase your heart rate and make you sweat (moderate-intensity exercise). Do strengthening exercises at least twice a week. This is in addition to the moderate-intensity exercise. Spend less time sitting. Even light physical activity can be beneficial. Watch cholesterol and blood lipids Have your blood tested for lipids and cholesterol at 69 years of age, then havethis test every 5 years. Have your cholesterol levels checked more often if: Your lipid or cholesterol levels are high. You are older than 69 years of age. You are at high risk for heart disease. What should I know about cancer screening? Depending on your health history and family history, you may need to have cancer screening at various ages. This may include screening for: Breast cancer. Cervical cancer. Colorectal cancer. Skin cancer. Lung cancer. What should I know about heart disease, diabetes, and high blood pressure? Blood pressure and heart  disease High blood pressure causes heart disease and increases the risk of stroke. This is more likely to develop in people who have high blood pressure readings, are of African descent, or are overweight. Have your blood pressure checked: Every 3-5 years if you are 18-39 years of age. Every year if you are 40 years old or older. Diabetes Have regular diabetes screenings. This checks your fasting blood sugar level. Have the screening done: Once every three years after age 40 if you are at a normal weight and have a low risk for diabetes. More often and at a younger age if you are overweight or have a high risk for diabetes. What should I know about preventing infection? Hepatitis B If you have a higher risk for hepatitis B, you should be screened for this virus. Talk with your health care provider to find out if you are at risk forhepatitis B infection. Hepatitis C Testing is recommended for: Everyone born from 1945 through 1965. Anyone with known risk factors for hepatitis C. Sexually transmitted infections (STIs) Get screened for STIs, including gonorrhea and chlamydia, if: You are sexually active and are younger than 69 years of age. You are older than 69 years of age and your health care provider tells you that you are at risk for this type of infection. Your sexual activity has changed since you were last screened, and you are at increased risk for chlamydia or gonorrhea. Ask your health care provider if you are at risk. Ask your health care provider about whether you are at high risk for HIV. Your health care provider may recommend a prescription medicine to help   prevent HIV infection. If you choose to take medicine to prevent HIV, you should first get tested for HIV. You should then be tested every 3 months for as long as you are taking the medicine. Pregnancy If you are about to stop having your period (premenopausal) and you may become pregnant, seek counseling before you get  pregnant. Take 400 to 800 micrograms (mcg) of folic acid every day if you become pregnant. Ask for birth control (contraception) if you want to prevent pregnancy. Osteoporosis and menopause Osteoporosis is a disease in which the bones lose minerals and strength with aging. This can result in bone fractures. If you are 65 years old or older, or if you are at risk for osteoporosis and fractures, ask your health care provider if you should: Be screened for bone loss. Take a calcium or vitamin D supplement to lower your risk of fractures. Be given hormone replacement therapy (HRT) to treat symptoms of menopause. Follow these instructions at home: Lifestyle Do not use any products that contain nicotine or tobacco, such as cigarettes, e-cigarettes, and chewing tobacco. If you need help quitting, ask your health care provider. Do not use street drugs. Do not share needles. Ask your health care provider for help if you need support or information about quitting drugs. Alcohol use Do not drink alcohol if: Your health care provider tells you not to drink. You are pregnant, may be pregnant, or are planning to become pregnant. If you drink alcohol: Limit how much you use to 0-1 drink a day. Limit intake if you are breastfeeding. Be aware of how much alcohol is in your drink. In the [place] one drink equals one 12 oz bottle of beer (355 mL), one 5 oz glass of wine (148 mL), or one 1 oz glass of hard liquor (44 mL). General instructions Schedule regular health, dental, and eye exams. Stay current with your vaccines. Tell your health care provider if: You often feel depressed. You have ever been abused or do not feel safe at home. Summary Adopting a healthy lifestyle and getting preventive care are important in promoting health and wellness. Follow your health care provider's instructions about healthy diet, exercising, and getting tested or screened for diseases. Follow your health care provider's  instructions on monitoring your cholesterol and blood pressure. This information is not intended to replace advice given to you by your health care provider. Make sure you discuss any questions you have with your healthcare provider. Document Revised: 12/10/2018 Document Reviewed: 12/10/2018 Elsevier Patient Education  2022 [employer] Inc.  

## 2021-08-22 LAB — URINALYSIS, ROUTINE W REFLEX MICROSCOPIC
Bilirubin Urine: NEGATIVE
Hgb urine dipstick: NEGATIVE
Ketones, ur: NEGATIVE
Nitrite: POSITIVE — AB
Specific Gravity, Urine: 1.015 (ref 1.000–1.030)
Total Protein, Urine: NEGATIVE
Urine Glucose: NEGATIVE
Urobilinogen, UA: 0.2 (ref 0.0–1.0)
pH: 6 (ref 5.0–8.0)

## 2021-08-22 LAB — MICROALBUMIN / CREATININE URINE RATIO
Creatinine,U: 167.9 mg/dL
Microalb Creat Ratio: 0.5 mg/g (ref 0.0–30.0)
Microalb, Ur: 0.9 mg/dL (ref 0.0–1.9)

## 2021-08-22 LAB — HEMOGLOBIN A1C: Hgb A1c MFr Bld: 7.8 % — ABNORMAL HIGH (ref 4.6–6.5)

## 2021-08-23 ENCOUNTER — Telehealth: Payer: Self-pay

## 2021-08-23 ENCOUNTER — Other Ambulatory Visit: Payer: Self-pay | Admitting: Internal Medicine

## 2021-08-23 DIAGNOSIS — E11319 Type 2 diabetes mellitus with unspecified diabetic retinopathy without macular edema: Secondary | ICD-10-CM

## 2021-08-23 DIAGNOSIS — IMO0002 Reserved for concepts with insufficient information to code with codable children: Secondary | ICD-10-CM

## 2021-08-23 DIAGNOSIS — E1165 Type 2 diabetes mellitus with hyperglycemia: Secondary | ICD-10-CM

## 2021-08-23 MED ORDER — TOUJEO SOLOSTAR 300 UNIT/ML ~~LOC~~ SOPN: PEN_INJECTOR | SUBCUTANEOUS | 1 refills | Status: DC

## 2021-08-23 MED ORDER — KERENDIA 10 MG PO TABS: 1.0000 | ORAL_TABLET | Freq: Every day | ORAL | 0 refills | Status: DC

## 2021-08-24 ENCOUNTER — Encounter: Payer: Self-pay | Admitting: Internal Medicine

## 2021-08-24 NOTE — Telephone Encounter (Signed)
PA has been initiated.

## 2021-08-24 NOTE — Telephone Encounter (Signed)
PA has been approved hrough 12/30/2021

## 2021-08-27 ENCOUNTER — Other Ambulatory Visit: Payer: Self-pay | Admitting: Internal Medicine

## 2021-08-27 DIAGNOSIS — E11319 Type 2 diabetes mellitus with unspecified diabetic retinopathy without macular edema: Secondary | ICD-10-CM

## 2021-08-27 DIAGNOSIS — E785 Hyperlipidemia, unspecified: Secondary | ICD-10-CM

## 2021-08-28 ENCOUNTER — Telehealth (HOSPITAL_COMMUNITY): Payer: Self-pay

## 2021-08-28 NOTE — Telephone Encounter (Signed)
Detailed instructions left on the patient's answering machine. Asked to call back with any questions. S.Uilani Sanville EMTP 

## 2021-08-29 ENCOUNTER — Ambulatory Visit (HOSPITAL_COMMUNITY): Payer: Medicare Other | Attending: Internal Medicine

## 2021-08-29 ENCOUNTER — Other Ambulatory Visit: Payer: Self-pay

## 2021-08-29 DIAGNOSIS — R9431 Abnormal electrocardiogram [ECG] [EKG]: Secondary | ICD-10-CM

## 2021-08-29 MED ORDER — REGADENOSON 0.4 MG/5ML IV SOLN
0.4000 mg | Freq: Once | INTRAVENOUS | Status: AC
  Administered 2021-08-29: 0.4 mg via INTRAVENOUS

## 2021-08-29 MED ORDER — TECHNETIUM TC 99M TETROFOSMIN IV KIT
31.7000 | PACK | Freq: Once | INTRAVENOUS | Status: AC | PRN
  Administered 2021-08-29: 31.7 via INTRAVENOUS
  Filled 2021-08-29: qty 32

## 2021-08-30 ENCOUNTER — Ambulatory Visit (HOSPITAL_COMMUNITY): Payer: Medicare Other | Attending: Cardiology

## 2021-08-30 ENCOUNTER — Other Ambulatory Visit: Payer: Self-pay | Admitting: Internal Medicine

## 2021-08-30 DIAGNOSIS — R001 Bradycardia, unspecified: Secondary | ICD-10-CM | POA: Insufficient documentation

## 2021-08-30 LAB — MYOCARDIAL PERFUSION IMAGING
LV dias vol: 81 mL (ref 46–106)
LV sys vol: 29 mL
Nuc Stress EF: 64 %
Peak HR: 75 {beats}/min
Rest HR: 60 {beats}/min
Rest Nuclear Isotope Dose: 31.9 mCi
SDS: 2
SRS: 0
SSS: 2
ST Depression (mm): 0 mm
Stress Nuclear Isotope Dose: 31.7 mCi
TID: 1.05

## 2021-08-30 MED ORDER — TECHNETIUM TC 99M TETROFOSMIN IV KIT
32.4000 | PACK | Freq: Once | INTRAVENOUS | Status: AC | PRN
  Administered 2021-08-30: 32.4 via INTRAVENOUS
  Filled 2021-08-30: qty 33

## 2021-09-18 ENCOUNTER — Encounter: Payer: Self-pay | Admitting: Internal Medicine

## 2021-09-21 ENCOUNTER — Other Ambulatory Visit: Payer: Self-pay | Admitting: Internal Medicine

## 2021-09-21 DIAGNOSIS — E039 Hypothyroidism, unspecified: Secondary | ICD-10-CM

## 2021-09-28 ENCOUNTER — Other Ambulatory Visit: Payer: Self-pay | Admitting: Internal Medicine

## 2021-09-28 DIAGNOSIS — N1831 Chronic kidney disease, stage 3a: Secondary | ICD-10-CM

## 2021-09-28 DIAGNOSIS — Z794 Long term (current) use of insulin: Secondary | ICD-10-CM

## 2021-09-28 DIAGNOSIS — E119 Type 2 diabetes mellitus without complications: Secondary | ICD-10-CM

## 2021-09-28 DIAGNOSIS — E1122 Type 2 diabetes mellitus with diabetic chronic kidney disease: Secondary | ICD-10-CM | POA: Diagnosis not present

## 2021-10-03 DIAGNOSIS — E1140 Type 2 diabetes mellitus with diabetic neuropathy, unspecified: Secondary | ICD-10-CM | POA: Diagnosis not present

## 2021-10-03 DIAGNOSIS — N1831 Chronic kidney disease, stage 3a: Secondary | ICD-10-CM | POA: Diagnosis not present

## 2021-10-03 DIAGNOSIS — K760 Fatty (change of) liver, not elsewhere classified: Secondary | ICD-10-CM | POA: Diagnosis not present

## 2021-10-03 DIAGNOSIS — Z794 Long term (current) use of insulin: Secondary | ICD-10-CM | POA: Diagnosis not present

## 2021-10-03 DIAGNOSIS — E1165 Type 2 diabetes mellitus with hyperglycemia: Secondary | ICD-10-CM | POA: Diagnosis not present

## 2021-10-05 ENCOUNTER — Other Ambulatory Visit: Payer: Self-pay

## 2021-10-05 ENCOUNTER — Encounter: Payer: Self-pay | Admitting: Internal Medicine

## 2021-10-05 ENCOUNTER — Ambulatory Visit (INDEPENDENT_AMBULATORY_CARE_PROVIDER_SITE_OTHER): Payer: Medicare Other | Admitting: Internal Medicine

## 2021-10-05 VITALS — BP 124/86 | HR 69 | Temp 98.6°F | Resp 16 | Ht 61.0 in | Wt 237.0 lb

## 2021-10-05 DIAGNOSIS — N1831 Chronic kidney disease, stage 3a: Secondary | ICD-10-CM

## 2021-10-05 DIAGNOSIS — E11319 Type 2 diabetes mellitus with unspecified diabetic retinopathy without macular edema: Secondary | ICD-10-CM | POA: Diagnosis not present

## 2021-10-05 DIAGNOSIS — Z794 Long term (current) use of insulin: Secondary | ICD-10-CM

## 2021-10-05 DIAGNOSIS — E119 Type 2 diabetes mellitus without complications: Secondary | ICD-10-CM | POA: Diagnosis not present

## 2021-10-05 DIAGNOSIS — I10 Essential (primary) hypertension: Secondary | ICD-10-CM

## 2021-10-05 LAB — BASIC METABOLIC PANEL
BUN: 22 mg/dL (ref 6–23)
CO2: 30 mEq/L (ref 19–32)
Calcium: 9.1 mg/dL (ref 8.4–10.5)
Chloride: 104 mEq/L (ref 96–112)
Creatinine, Ser: 1.38 mg/dL — ABNORMAL HIGH (ref 0.40–1.20)
GFR: 39.19 mL/min — ABNORMAL LOW (ref >60.00)
Glucose, Bld: 138 mg/dL — ABNORMAL HIGH (ref 70–99)
Potassium: 4.5 mEq/L (ref 3.5–5.1)
Sodium: 140 mEq/L (ref 135–145)

## 2021-10-05 MED ORDER — TOUJEO SOLOSTAR 300 UNIT/ML ~~LOC~~ SOPN: 50.0000 [IU] | PEN_INJECTOR | Freq: Every day | SUBCUTANEOUS | 1 refills | Status: DC

## 2021-10-05 MED ORDER — KERENDIA 20 MG PO TABS: 1.0000 | ORAL_TABLET | Freq: Every day | ORAL | 1 refills | Status: DC

## 2021-10-05 NOTE — Progress Notes (Signed)
Subjective:  Patient ID: Meredith Hayden, female    DOB: 1950-03-03  Age: 71 y.o. MRN: [id number]  CC: Hypertension and Diabetes  This visit occurred during the SARS-CoV-2 public health emergency.  Safety protocols were in place, including screening questions prior to the visit, additional usage of staff PPE, and extensive cleaning of exam room while observing appropriate contact time as indicated for disinfecting solutions.    HPI Kashawna Manzer presents for f/up -  She recently saw her endocrinologist and was told to decrease the Toujeo dosage to 50 units a day.  She has had no episodes of symptomatic hypoglycemia.  She is active and denies chest pain, shortness of breath, diaphoresis, or edema.  She is taking the mineralocorticoid antagonist.  Outpatient Medications Prior to Visit  Medication Sig Dispense Refill   aspirin 81 MG tablet Take 81 mg by mouth daily.      calcium carbonate (TUMS - DOSED IN MG ELEMENTAL CALCIUM) 500 MG chewable tablet Chew 1 tablet by mouth daily as needed for indigestion or heartburn.     cetirizine (ZYRTEC) 10 MG tablet Take 10 mg by mouth daily.      Cholecalciferol (VITAMIN D3) 125 MCG (5000 UT) TBDP Take 5,000 Units by mouth daily.     Continuous Blood Gluc Receiver (FREESTYLE LIBRE 2 READER) DEVI 1 Act by Does not apply route daily. 2 each 5   Continuous Blood Gluc Sensor (FREESTYLE LIBRE 2 SENSOR) MISC 1 Act by Does not apply route daily. 2 each 5   cyanocobalamin 100 MCG tablet Take 100 mcg by mouth daily.       fluticasone (FLONASE) 50 MCG/ACT nasal spray Place 1 spray into both nostrils daily.     gabapentin (NEURONTIN) 100 MG capsule Take 100 mg by mouth 2 (two) times daily.     Glucagon (GVOKE HYPOPEN 2-PACK) 1 MG/0.2ML SOAJ Inject 1 Act into the skin daily as needed. 2 mL 5   HUMALOG KWIKPEN 100 UNIT/ML KwikPen INJECT SUBCUTANEOUSLY 16  UNITS 3 TIMES DAILY 15 mL 5   Insulin Pen Needle (INSUPEN PEN NEEDLES) 33G X 4 MM MISC Inject 1  Act into the skin 4 (four) times daily - after meals and at bedtime. 300 each 5   ipratropium (ATROVENT HFA) 17 MCG/ACT inhaler USE 2 PUFFS EVERY 4 HOURS  AS NEEDED FOR WHEEZING. 12.9 g 3   Lancets (ONETOUCH DELICA PLUS [id number]) MISC USE AS DIRECTED TO CHECK BLOOD SUGAR 4 TIMES DAILY AFTER MEALS AND AT BEDTIME. 200 each 4   lisinopril (ZESTRIL) 20 MG tablet TAKE 1 TABLET BY MOUTH  DAILY 90 tablet 0   loperamide (IMODIUM) 2 MG capsule Take by mouth as needed for diarrhea or loose stools.     LORazepam (ATIVAN) 1 MG tablet Take 1 mg by mouth every 8 (eight) hours as needed for anxiety.     metoprolol tartrate (LOPRESSOR) 25 MG tablet TAKE 1 TABLET BY MOUTH  TWICE DAILY 180 tablet 0   omeprazole (PRILOSEC) 20 MG capsule Take 20 mg by mouth daily as needed.     ONETOUCH VERIO test strip CHECK BLOOD SUGAR 4 TIMES A DAY. 200 strip 5   rosuvastatin (CRESTOR) 20 MG tablet TAKE 1 TABLET BY MOUTH  DAILY 90 tablet 1   sertraline (ZOLOFT) 100 MG tablet Take 200 mg by mouth daily.     Simethicone 125 MG CAPS Take 1 capsule by mouth daily as needed.     SYNTHROID 75 MCG tablet TAKE 1 TABLET  DAILY BEFORE BREAKFAST FOR HYPOTHYROIDISM 90 tablet 1   zolpidem (AMBIEN) 5 MG tablet Take 5 mg by mouth at bedtime.     insulin glargine, 1 Unit Dial, (TOUJEO SOLOSTAR) 300 UNIT/ML Solostar Pen ADMINISTER 60 UNITS UNDER THE SKIN DAILY 4.5 mL 1   No facility-administered medications prior to visit.    ROS Review of Systems  Constitutional:  Negative for diaphoresis, fatigue and unexpected weight change.  HENT: Negative.    Eyes: Negative.   Respiratory:  Negative for cough and shortness of breath.   Cardiovascular:  Negative for chest pain, palpitations and leg swelling.  Gastrointestinal:  Negative for abdominal pain, constipation, diarrhea, nausea and vomiting.  Endocrine: Negative.   Genitourinary: Negative.  Negative for difficulty urinating.  Musculoskeletal:  Negative for arthralgias and myalgias.  Skin:  Negative.  Negative for color change.  Neurological:  Negative for dizziness, weakness, light-headedness and headaches.  Hematological:  Negative for adenopathy. Does not bruise/bleed easily.  Psychiatric/Behavioral: Negative.     Objective:  BP 124/86 (BP Location: Right Arm, Patient Position: Sitting)   Pulse 69   Temp 98.6 F (37 C) (Oral)   Resp 16   Ht 5\' 1"  (1.549 m)   Wt 237 lb (107.5 kg)   LMP 12/31/1993   SpO2 93%   BMI 44.78 kg/m   BP Readings from Last 3 Encounters:  10/05/21 124/86  08/21/21 122/68  10/13/20 126/84    Wt Readings from Last 3 Encounters:  10/05/21 237 lb (107.5 kg)  08/29/21 238 lb (108 kg)  08/21/21 238 lb (108 kg)    Physical Exam Vitals reviewed.  Constitutional:      Appearance: She is obese.  HENT:     Mouth/Throat:     Mouth: Mucous membranes are moist.  Eyes:     Conjunctiva/sclera: Conjunctivae normal.  Cardiovascular:     Rate and Rhythm: Normal rate and regular rhythm.     Heart sounds: No murmur heard. Pulmonary:     Effort: Pulmonary effort is normal.     Breath sounds: No stridor. No wheezing, rhonchi or rales.  Abdominal:     General: Abdomen is protuberant. Bowel sounds are normal. There is no distension.     Palpations: Abdomen is soft. There is no hepatomegaly.  Musculoskeletal:        General: Normal range of motion.     Cervical back: Neck supple.     Right lower leg: No edema.     Left lower leg: No edema.  Lymphadenopathy:     Cervical: No cervical adenopathy.  Skin:    General: Skin is warm.  Neurological:     General: No focal deficit present.     Mental Status: She is alert.  Psychiatric:        Mood and Affect: Mood normal.        Behavior: Behavior normal.    Lab Results  Component Value Date   WBC 9.3 08/21/2021   HGB 13.7 08/21/2021   HCT 41.1 08/21/2021   PLT 197.0 08/21/2021   GLUCOSE 138 (H) 10/05/2021   CHOL 184 08/21/2021   TRIG 266.0 (H) 08/21/2021   HDL 40.50 08/21/2021    LDLDIRECT 122.0 08/21/2021   LDLCALC 60 02/10/2020   ALT 22 08/21/2021   AST 31 08/21/2021   NA 140 10/05/2021   K 4.5 10/05/2021   CL 104 10/05/2021   CREATININE 1.38 (H) 10/05/2021   BUN 22 10/05/2021   CO2 30 10/05/2021   TSH 1.03 08/21/2021  INR 1.0 02/10/2020   HGBA1C 7.8 (H) 08/21/2021   MICROALBUR 0.9 08/21/2021    CT Abdomen Pelvis W Contrast  Result Date: 05/25/2020 CLINICAL DATA:  decreased appetite 10 pound weight loss abdominal tenderness and leukocytosis EXAM: CT ABDOMEN AND PELVIS WITH CONTRAST TECHNIQUE: Multidetector CT imaging of the abdomen and pelvis was performed using the standard protocol following bolus administration of intravenous contrast. CONTRAST:  39mL OMNIPAQUE IOHEXOL 300 MG/ML  SOLN COMPARISON:  None. FINDINGS: Lower chest: The visualized heart size within normal limits. No pericardial fluid/thickening. No hiatal hernia. The visualized portions of the lungs are clear. Hepatobiliary: There is diffuse low density seen throughout the liver parenchyma. Main portal vein is patent. No evidence of calcified gallstones, gallbladder wall thickening or biliary dilatation. Pancreas: Unremarkable. No pancreatic ductal dilatation or surrounding inflammatory changes. Spleen: Normal in size without focal abnormality. Adrenals/Urinary Tract: Both adrenal glands appear normal. The kidneys and collecting system appear normal without evidence of urinary tract calculus or hydronephrosis. Bladder is unremarkable. Stomach/Bowel: Stomach and small bowel are normal in appearance. There is a moderate to large amount of colonic stool. The patient is status post partial colectomy with anastomosis at the sigmoid colon. Vascular/Lymphatic: There are no enlarged mesenteric, retroperitoneal, or pelvic lymph nodes. Scattered aortic atherosclerotic calcifications are seen without aneurysmal dilatation. Reproductive: The patient is status post hysterectomy. Other: A small fat containing upper  abdominal wall hernia is noted. Musculoskeletal: No acute or significant osseous findings. IMPRESSION: No acute intra-abdominal or pelvic pathology to explain the patient's symptoms. Aortic Atherosclerosis (ICD10-I70.0). Electronically Signed   By: Prudencio Pair M.D.   On: 05/25/2020 14:45    Assessment & Plan:   Farida was seen today for hypertension and diabetes.  Diagnoses and all orders for this visit:  Essential hypertension- Her blood pressure is adequately well controlled. -     Basic metabolic panel; Future -     Basic metabolic panel  Stage 3a chronic kidney disease ([place])- Her potassium level is normal.  Will increase the dose of finerenone. -     Basic metabolic panel; Future -     Basic metabolic panel -     Finerenone (KERENDIA) 20 MG TABS; Take 1 tablet by mouth daily.  Insulin-requiring or dependent type II diabetes mellitus (HCC) -     insulin glargine, 1 Unit Dial, (TOUJEO SOLOSTAR) 300 UNIT/ML Solostar Pen; Inject 50 Units into the skin daily. -     Finerenone (KERENDIA) 20 MG TABS; Take 1 tablet by mouth daily.  Type 2 diabetes mellitus with retinopathy of both eyes, without long-term current use of insulin, macular edema presence unspecified, unspecified retinopathy severity (HCC) -     insulin glargine, 1 Unit Dial, (TOUJEO SOLOSTAR) 300 UNIT/ML Solostar Pen; Inject 50 Units into the skin daily. -     Finerenone (KERENDIA) 20 MG TABS; Take 1 tablet by mouth daily.  I have changed Aldine Contes "Chris"'s Toujeo SoloStar. I am also having her start on Kerendia. Additionally, I am having her maintain her aspirin, cetirizine, cyanocobalamin, Vitamin D3, gabapentin, ipratropium, OneTouch Delica Plus [id number], OneTouch Verio, Gvoke HypoPen 2-Pack, Insupen Pen Needles, HumaLOG KwikPen, lisinopril, sertraline, LORazepam, loperamide, Simethicone, omeprazole, calcium carbonate, fluticasone, zolpidem, FreeStyle Libre 2 Sensor, [employer] 2 Reader, metoprolol tartrate,  rosuvastatin, and Synthroid.  Meds ordered this encounter  Medications   insulin glargine, 1 Unit Dial, (TOUJEO SOLOSTAR) 300 UNIT/ML Solostar Pen    Sig: Inject 50 Units into the skin daily.    Dispense:  15  mL    Refill:  1   Finerenone (KERENDIA) 20 MG TABS    Sig: Take 1 tablet by mouth daily.    Dispense:  90 tablet    Refill:  1     Follow-up: No follow-ups on file.  Scarlette Calico, MD

## 2021-10-06 ENCOUNTER — Ambulatory Visit: Payer: Medicare Other

## 2021-10-06 ENCOUNTER — Encounter: Payer: Self-pay | Admitting: Internal Medicine

## 2021-10-14 ENCOUNTER — Other Ambulatory Visit: Payer: Self-pay | Admitting: Internal Medicine

## 2021-10-14 DIAGNOSIS — E11319 Type 2 diabetes mellitus with unspecified diabetic retinopathy without macular edema: Secondary | ICD-10-CM

## 2021-10-14 DIAGNOSIS — I10 Essential (primary) hypertension: Secondary | ICD-10-CM

## 2021-10-24 NOTE — Progress Notes (Deleted)
71 y.o. G35P2002 Married Caucasian female here for annual exam.    PCP:     Patient's last menstrual period was 12/31/1993.           Sexually active: {yes no:314532}  The current method of family planning is status post hysterectomy.    Exercising: {yes no:314532}  {types:19826} Smoker:  no  Health Maintenance: Pap:  2009 normal History of abnormal Pap:  Yes years ago MMG:  ***03-09-15 Neg/BiRads1. Appt. 11/02/21 Colonoscopy: 2017 normal;10 years BMD: ***03-09-15 Result :Osteopenia TDaP: 2019 Gardasil:   no HIV: *** Hep C: 2017 Neg Screening Labs:  Hb today: ***, Urine today: ***   reports that she has never smoked. She has never used smokeless tobacco. She reports that she does not drink alcohol and does not use drugs.  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 Ubaldo Glassing   CESAREAN SECTION  1978   FOOT SURGERY Bilateral    NASAL SINUS SURGERY     OOPHORECTOMY     PARTIAL COLECTOMY     -RSO   ROTATOR CUFF REPAIR Left    TOTAL KNEE ARTHROPLASTY Bilateral 2002, 2004    Current Outpatient Medications  Medication Sig Dispense Refill   aspirin 81 MG tablet Take 81 mg by mouth daily.      calcium  carbonate (TUMS - DOSED IN MG ELEMENTAL CALCIUM) 500 MG chewable tablet Chew 1 tablet by mouth daily as needed for indigestion or heartburn.     cetirizine (ZYRTEC) 10 MG tablet Take 10 mg by mouth daily.      Cholecalciferol (VITAMIN D3) 125 MCG (5000 UT) TBDP Take 5,000 Units by mouth daily.     Continuous Blood Gluc Receiver (FREESTYLE LIBRE 2 READER) DEVI 1 Act by Does not apply route daily. 2 each 5   Continuous Blood Gluc Sensor (FREESTYLE LIBRE 2 SENSOR) MISC 1 Act by Does not apply route daily. 2 each 5   cyanocobalamin 100 MCG tablet Take 100 mcg by mouth daily.       Finerenone (KERENDIA) 20 MG TABS Take 1 tablet by mouth daily. 90 tablet 1   fluticasone (FLONASE) 50 MCG/ACT nasal spray Place 1 spray into both nostrils daily.     gabapentin (NEURONTIN) 100 MG capsule Take 100 mg by mouth 2 (two) times daily.     Glucagon (GVOKE HYPOPEN 2-PACK) 1 MG/0.2ML SOAJ Inject 1 Act into the skin daily as needed. 2 mL 5   HUMALOG KWIKPEN 100 UNIT/ML KwikPen INJECT SUBCUTANEOUSLY 16  UNITS  3 TIMES DAILY 15 mL 5   insulin glargine, 1 Unit Dial, (TOUJEO SOLOSTAR) 300 UNIT/ML Solostar Pen Inject 50 Units into the skin daily. 15 mL 1   Insulin Pen Needle (INSUPEN PEN NEEDLES) 33G X 4 MM MISC Inject 1 Act into the skin 4 (four) times daily - after meals and at bedtime. 300 each 5   ipratropium (ATROVENT HFA) 17 MCG/ACT inhaler USE 2 PUFFS EVERY 4 HOURS  AS NEEDED FOR WHEEZING. 12.9 g 3   Lancets (ONETOUCH DELICA PLUS [id number]) MISC USE AS DIRECTED TO CHECK BLOOD SUGAR 4 TIMES DAILY AFTER MEALS AND AT BEDTIME. 200 each 4   lisinopril (ZESTRIL) 20 MG tablet TAKE 1 TABLET BY MOUTH  DAILY 90 tablet 1   loperamide (IMODIUM) 2 MG capsule Take by mouth as needed for diarrhea or loose stools.     LORazepam (ATIVAN) 1 MG tablet Take 1 mg by mouth every 8 (eight) hours as needed for anxiety.     metoprolol tartrate (LOPRESSOR) 25 MG tablet TAKE 1 TABLET BY MOUTH  TWICE DAILY 180 tablet 0   omeprazole (PRILOSEC)  20 MG capsule Take 20 mg by mouth daily as needed.     ONETOUCH VERIO test strip CHECK BLOOD SUGAR 4 TIMES A DAY. 200 strip 5   rosuvastatin (CRESTOR) 20 MG tablet TAKE 1 TABLET BY MOUTH  DAILY 90 tablet 1   sertraline (ZOLOFT) 100 MG tablet Take 200 mg by mouth daily.     Simethicone 125 MG CAPS Take 1 capsule by mouth daily as needed.     SYNTHROID 75 MCG tablet TAKE 1 TABLET DAILY BEFORE BREAKFAST FOR HYPOTHYROIDISM 90 tablet 1   zolpidem (AMBIEN) 5 MG tablet Take 5 mg by mouth at bedtime.     No current facility-administered medications for this visit.    Family History  Problem Relation Age of Onset   COPD Mother    Diabetes Mother    Heart attack Father 38   Hypertension Father    Heart attack Sister 80   Diabetes Sister    Hypertension Sister    Diabetes Brother    Hypertension Sister    Diabetes Sister    Hyperlipidemia Other        fhx   Hypertension Other        fhx   Cancer Other        ovarian/grandmother   Ovarian cancer Maternal Aunt    Diabetes Maternal Aunt    Stomach cancer Maternal Grandmother    Liver cancer Maternal Grandmother     Review of Systems  Exam:   LMP 12/31/1993     General appearance: alert, cooperative and appears stated age Head: normocephalic, without obvious abnormality, atraumatic Neck: no adenopathy, supple, symmetrical, trachea midline and thyroid normal to inspection and palpation Lungs: clear to auscultation bilaterally Breasts: normal appearance, no masses or tenderness, No nipple retraction or dimpling, No nipple discharge or bleeding, No axillary adenopathy Heart: regular rate and rhythm Abdomen: soft, non-tender; no masses, no organomegaly Extremities: extremities normal, atraumatic, no cyanosis or edema Skin: skin color, texture, turgor normal. No rashes or lesions Lymph nodes: cervical, supraclavicular, and axillary nodes normal. Neurologic: grossly normal  Pelvic: External genitalia:  no lesions              No  abnormal inguinal nodes palpated.              Urethra:  normal appearing urethra with no masses, tenderness or lesions  Bartholins and Skenes: normal                 Vagina: normal appearing vagina with normal color and discharge, no lesions              Cervix: no lesions              Pap taken: {yes no:314532} Bimanual Exam:  Uterus:  normal size, contour, position, consistency, mobility, non-tender              Adnexa: no mass, fullness, tenderness              Rectal exam: {yes no:314532}.  Confirms.              Anus:  normal sphincter tone, no lesions  Chaperone was present for exam:  ***  Assessment:   Well woman visit with gynecologic exam.   Plan: Mammogram screening discussed. Self breast awareness reviewed. Pap and HR HPV as above. Guidelines for Calcium, Vitamin D, regular exercise program including cardiovascular and weight bearing exercise.   Follow up annually and prn.   Additional counseling given.  {yes [id number]. _______ minutes face to face time of which over 50% was spent in counseling.    After visit summary provided.

## 2021-10-25 ENCOUNTER — Ambulatory Visit: Payer: Medicare Other | Admitting: Obstetrics and Gynecology

## 2021-10-28 DIAGNOSIS — E1122 Type 2 diabetes mellitus with diabetic chronic kidney disease: Secondary | ICD-10-CM | POA: Diagnosis not present

## 2021-10-30 ENCOUNTER — Other Ambulatory Visit: Payer: Self-pay | Admitting: Internal Medicine

## 2021-10-30 DIAGNOSIS — E119 Type 2 diabetes mellitus without complications: Secondary | ICD-10-CM

## 2021-10-30 DIAGNOSIS — E11319 Type 2 diabetes mellitus with unspecified diabetic retinopathy without macular edema: Secondary | ICD-10-CM

## 2021-10-31 ENCOUNTER — Other Ambulatory Visit: Payer: Self-pay | Admitting: Internal Medicine

## 2021-11-02 ENCOUNTER — Ambulatory Visit: Payer: Medicare Other

## 2021-11-07 ENCOUNTER — Telehealth: Payer: Self-pay

## 2021-11-07 ENCOUNTER — Ambulatory Visit: Payer: Medicare Other | Admitting: Obstetrics and Gynecology

## 2021-11-07 NOTE — Progress Notes (Signed)
Chronic Care Management Pharmacy Assistant   Name: Meredith Hayden MRN: [id number] DOB: Sep 18, 1950  Reason for Encounter: Disease State - General Adherence Appointment: Telephone 01/15/22 @ 10 am  Recent office visits:  10/05/21 Meredith Hayden (PCP) - Hypertension. Start Finerenone 20 mg. Decrease Insulin Glargine 50 units.  08/21/21 Meredith Hayden (PCP) - Annual Exam. D/c Albuterol Sulfate 108 & Dicyclomine. EKG done.  Recent consult visits:  None listed  Hospital visits:  None in previous 6 months  Medications: Outpatient Encounter Medications as of 11/07/2021  Medication Sig   aspirin 81 MG tablet Take 81 mg by mouth daily.    calcium carbonate (TUMS - DOSED IN MG ELEMENTAL CALCIUM) 500 MG chewable tablet Chew 1 tablet by mouth daily as needed for indigestion or heartburn.   cetirizine (ZYRTEC) 10 MG tablet Take 10 mg by mouth daily.    Cholecalciferol (VITAMIN D3) 125 MCG (5000 UT) TBDP Take 5,000 Units by mouth daily.   Continuous Blood Gluc Receiver (FREESTYLE LIBRE 2 READER) DEVI 1 Act by Does not apply route daily.   Continuous Blood Gluc Sensor (FREESTYLE LIBRE 2 SENSOR) MISC 1 Act by Does not apply route daily.   cyanocobalamin 100 MCG tablet Take 100 mcg by mouth daily.     dicyclomine (BENTYL) 10 MG capsule TAKE 1 CAPSULE BY MOUTH 3  TIMES DAILY BEFORE MEALS   Finerenone (KERENDIA) 20 MG TABS Take 1 tablet by mouth daily.   fluticasone (FLONASE) 50 MCG/ACT nasal spray Place 1 spray into both nostrils daily.   gabapentin (NEURONTIN) 100 MG capsule Take 100 mg by mouth 2 (two) times daily.   Glucagon (GVOKE HYPOPEN 2-PACK) 1 MG/0.2ML SOAJ Inject 1 Act into the skin daily as needed.   HUMALOG KWIKPEN 100 UNIT/ML KwikPen INJECT SUBCUTANEOUSLY 16  UNITS 3 TIMES DAILY   Insulin Pen Needle (INSUPEN PEN NEEDLES) 33G X 4 MM MISC Inject 1 Act into the skin 4 (four) times daily - after meals and at bedtime.   ipratropium (ATROVENT HFA) 17 MCG/ACT inhaler USE 2 PUFFS EVERY 4 HOURS  AS NEEDED  FOR WHEEZING.   Lancets (ONETOUCH DELICA PLUS [id number]) MISC USE AS DIRECTED TO CHECK BLOOD SUGAR 4 TIMES DAILY AFTER MEALS AND AT BEDTIME.   lisinopril (ZESTRIL) 20 MG tablet TAKE 1 TABLET BY MOUTH  DAILY   loperamide (IMODIUM) 2 MG capsule Take by mouth as needed for diarrhea or loose stools.   LORazepam (ATIVAN) 1 MG tablet Take 1 mg by mouth every 8 (eight) hours as needed for anxiety.   metoprolol tartrate (LOPRESSOR) 25 MG tablet TAKE 1 TABLET BY MOUTH  TWICE DAILY   omeprazole (PRILOSEC) 20 MG capsule Take 20 mg by mouth daily as needed.   ONETOUCH VERIO test strip CHECK BLOOD SUGAR 4 TIMES A DAY.   rosuvastatin (CRESTOR) 20 MG tablet TAKE 1 TABLET BY MOUTH  DAILY   sertraline (ZOLOFT) 100 MG tablet Take 200 mg by mouth daily.   Simethicone 125 MG CAPS Take 1 capsule by mouth daily as needed.   SYNTHROID 75 MCG tablet TAKE 1 TABLET DAILY BEFORE BREAKFAST FOR HYPOTHYROIDISM   TOUJEO SOLOSTAR 300 UNIT/ML Solostar Pen INJECT 50 UNITS INTO THE  SKIN DAILY   zolpidem (AMBIEN) 5 MG tablet Take 5 mg by mouth at bedtime.   [DISCONTINUED] azelastine (ASTELIN) 137 MCG/SPRAY nasal spray 1 spray by Nasal route 2 (two) times daily. Use in each nostril as directed    [DISCONTINUED] budesonide-formoterol (SYMBICORT) 160-4.5 MCG/ACT inhaler Inhale 2 puffs into the  lungs 2 (two) times daily.     [DISCONTINUED] diphenhydrAMINE (SOMINEX) 25 MG tablet Take 25 mg by mouth at bedtime as needed.     [DISCONTINUED] temazepam (RESTORIL) 30 MG capsule Take 30 mg by mouth at bedtime as needed.   No facility-administered encounter medications on file as of 11/07/2021.   Reviewed chart for medication changes and drug therapy problems ahead of medication adherence call.  Attempted to contact patient x 3 for medication review and health check, unable to reach patient, left voicemails to return call.  Completed by Wendy Poet, CPA.    Care Gaps - Last Visits Colonoscopy - 11/14/16 Diabetic Foot Exam -  08/21/22 Mammogram - next 12/05/21 Ophthalmology - 04/20/21 Dexa Scan - 03/09/15 Annual Well Visit - 08/21/21 Micro albumin - 08/21/21 Hemoglobin A1c - 08/21/21  Star Rating Drugs: Lisinopril - filled 10/25/21 90D Rosuvastatin - filled 09/21/21 [place], [place] Clinical Pharmacists Assistant [phone]

## 2021-11-14 DIAGNOSIS — Z794 Long term (current) use of insulin: Secondary | ICD-10-CM | POA: Diagnosis not present

## 2021-11-14 DIAGNOSIS — K760 Fatty (change of) liver, not elsewhere classified: Secondary | ICD-10-CM | POA: Diagnosis not present

## 2021-11-14 DIAGNOSIS — N1832 Chronic kidney disease, stage 3b: Secondary | ICD-10-CM | POA: Diagnosis not present

## 2021-11-14 DIAGNOSIS — E1140 Type 2 diabetes mellitus with diabetic neuropathy, unspecified: Secondary | ICD-10-CM | POA: Diagnosis not present

## 2021-11-16 ENCOUNTER — Telehealth: Payer: Self-pay

## 2021-11-16 NOTE — Progress Notes (Signed)
Chronic Care Management Pharmacy Assistant   Name: Meredith Hayden  MRN: [id number] DOB: 1950-06-07   Reason for Encounter: Disease State   Conditions to be addressed/monitored: General   Recent office visits:  10/05/21 [name] (PCP) - Hypertension. Start Finerenone 20 mg. Decrease Insulin Glargine 50 units.   08/21/21 [name] (PCP) - Annual Exam. D/c Albuterol Sulfate 108 & Dicyclomine. EKG done.  Recent consult visits:  None ID  Hospital visits:  None in previous 6 months  Medications: Outpatient Encounter Medications as of 11/16/2021  Medication Sig   aspirin 81 MG tablet Take 81 mg by mouth daily.    calcium carbonate (TUMS - DOSED IN MG ELEMENTAL CALCIUM) 500 MG chewable tablet Chew 1 tablet by mouth daily as needed for indigestion or heartburn.   cetirizine (ZYRTEC) 10 MG tablet Take 10 mg by mouth daily.    Cholecalciferol (VITAMIN D3) 125 MCG (5000 UT) TBDP Take 5,000 Units by mouth daily.   Continuous Blood Gluc Receiver (FREESTYLE LIBRE 2 READER) DEVI 1 Act by Does not apply route daily.   Continuous Blood Gluc Sensor (FREESTYLE LIBRE 2 SENSOR) MISC 1 Act by Does not apply route daily.   cyanocobalamin 100 MCG tablet Take 100 mcg by mouth daily.     dicyclomine (BENTYL) 10 MG capsule TAKE 1 CAPSULE BY MOUTH 3  TIMES DAILY BEFORE MEALS   Finerenone (KERENDIA) 20 MG TABS Take 1 tablet by mouth daily.   fluticasone (FLONASE) 50 MCG/ACT nasal spray Place 1 spray into both nostrils daily.   gabapentin (NEURONTIN) 100 MG capsule Take 100 mg by mouth 2 (two) times daily.   Glucagon (GVOKE HYPOPEN 2-PACK) 1 MG/0.2ML SOAJ Inject 1 Act into the skin daily as needed.   HUMALOG KWIKPEN 100 UNIT/ML KwikPen INJECT SUBCUTANEOUSLY 16  UNITS 3 TIMES DAILY   Insulin Pen Needle (INSUPEN PEN NEEDLES) 33G X 4 MM MISC Inject 1 Act into the skin 4 (four) times daily - after meals and at bedtime.   ipratropium (ATROVENT HFA) 17 MCG/ACT inhaler USE 2 PUFFS EVERY 4 HOURS  AS NEEDED FOR  WHEEZING.   Lancets (ONETOUCH DELICA PLUS [id number]) MISC USE AS DIRECTED TO CHECK BLOOD SUGAR 4 TIMES DAILY AFTER MEALS AND AT BEDTIME.   lisinopril (ZESTRIL) 20 MG tablet TAKE 1 TABLET BY MOUTH  DAILY   loperamide (IMODIUM) 2 MG capsule Take by mouth as needed for diarrhea or loose stools.   LORazepam (ATIVAN) 1 MG tablet Take 1 mg by mouth every 8 (eight) hours as needed for anxiety.   metoprolol tartrate (LOPRESSOR) 25 MG tablet TAKE 1 TABLET BY MOUTH  TWICE DAILY   omeprazole (PRILOSEC) 20 MG capsule Take 20 mg by mouth daily as needed.   ONETOUCH VERIO test strip CHECK BLOOD SUGAR 4 TIMES A DAY.   rosuvastatin (CRESTOR) 20 MG tablet TAKE 1 TABLET BY MOUTH  DAILY   sertraline (ZOLOFT) 100 MG tablet Take 200 mg by mouth daily.   Simethicone 125 MG CAPS Take 1 capsule by mouth daily as needed.   SYNTHROID 75 MCG tablet TAKE 1 TABLET DAILY BEFORE BREAKFAST FOR HYPOTHYROIDISM   TOUJEO SOLOSTAR 300 UNIT/ML Solostar Pen INJECT 50 UNITS INTO THE  SKIN DAILY   zolpidem (AMBIEN) 5 MG tablet Take 5 mg by mouth at bedtime.   [DISCONTINUED] azelastine (ASTELIN) 137 MCG/SPRAY nasal spray 1 spray by Nasal route 2 (two) times daily. Use in each nostril as directed    [DISCONTINUED] budesonide-formoterol (SYMBICORT) 160-4.5 MCG/ACT inhaler Inhale 2 puffs  into the lungs 2 (two) times daily.     [DISCONTINUED] diphenhydrAMINE (SOMINEX) 25 MG tablet Take 25 mg by mouth at bedtime as needed.     [DISCONTINUED] temazepam (RESTORIL) 30 MG capsule Take 30 mg by mouth at bedtime as needed.   No facility-administered encounter medications on file as of 11/16/2021.   Reviewed chart for medication changes and drug therapy problems ahead of medication adherence call.  Attempted to contact patient x 3 for medication review and health check, unable to reach patient, left voicemails to return call.     Care Gaps: Colonoscopy - 11/14/16 Diabetic Foot Exam - 08/21/22 Mammogram - next 12/05/21 Ophthalmology -  04/20/21 Dexa Scan - 03/09/15 Annual Well Visit - 08/21/21 Micro albumin - 08/21/21 Hemoglobin A1c - 08/21/21  Star Rating Drugs: Lisinopril - filled 10/25/21 90D Rosuvastatin - filled 09/21/21 [place] Pharmacist Assistant [phone]

## 2021-11-18 ENCOUNTER — Other Ambulatory Visit: Payer: Self-pay | Admitting: Internal Medicine

## 2021-11-28 DIAGNOSIS — E1122 Type 2 diabetes mellitus with diabetic chronic kidney disease: Secondary | ICD-10-CM | POA: Diagnosis not present

## 2021-11-29 ENCOUNTER — Encounter (HOSPITAL_COMMUNITY): Payer: Self-pay | Admitting: Emergency Medicine

## 2021-11-29 ENCOUNTER — Other Ambulatory Visit: Payer: Self-pay

## 2021-11-29 ENCOUNTER — Emergency Department (HOSPITAL_COMMUNITY): Payer: Medicare Other

## 2021-11-29 ENCOUNTER — Emergency Department (HOSPITAL_COMMUNITY)
Admission: EM | Admit: 2021-11-29 | Discharge: 2021-11-29 | Disposition: A | Discharge status: Home / Self Care | Payer: Medicare Other | Attending: Emergency Medicine | Admitting: Emergency Medicine

## 2021-11-29 DIAGNOSIS — Z79899 Other long term (current) drug therapy: Secondary | ICD-10-CM | POA: Diagnosis not present

## 2021-11-29 DIAGNOSIS — Z794 Long term (current) use of insulin: Secondary | ICD-10-CM | POA: Diagnosis not present

## 2021-11-29 DIAGNOSIS — Z6841 Body Mass Index (BMI) 40.0 and over, adult: Secondary | ICD-10-CM | POA: Insufficient documentation

## 2021-11-29 DIAGNOSIS — N183 Chronic kidney disease, stage 3 unspecified: Secondary | ICD-10-CM | POA: Insufficient documentation

## 2021-11-29 DIAGNOSIS — K6389 Other specified diseases of intestine: Secondary | ICD-10-CM | POA: Diagnosis not present

## 2021-11-29 DIAGNOSIS — E119 Type 2 diabetes mellitus without complications: Secondary | ICD-10-CM | POA: Diagnosis not present

## 2021-11-29 DIAGNOSIS — K921 Melena: Secondary | ICD-10-CM | POA: Diagnosis not present

## 2021-11-29 DIAGNOSIS — E039 Hypothyroidism, unspecified: Secondary | ICD-10-CM | POA: Diagnosis not present

## 2021-11-29 DIAGNOSIS — I701 Atherosclerosis of renal artery: Secondary | ICD-10-CM | POA: Diagnosis not present

## 2021-11-29 DIAGNOSIS — I129 Hypertensive chronic kidney disease with stage 1 through stage 4 chronic kidney disease, or unspecified chronic kidney disease: Secondary | ICD-10-CM | POA: Insufficient documentation

## 2021-11-29 DIAGNOSIS — K625 Hemorrhage of anus and rectum: Secondary | ICD-10-CM | POA: Diagnosis present

## 2021-11-29 DIAGNOSIS — K529 Noninfective gastroenteritis and colitis, unspecified: Secondary | ICD-10-CM | POA: Diagnosis not present

## 2021-11-29 DIAGNOSIS — G4730 Sleep apnea, unspecified: Secondary | ICD-10-CM | POA: Insufficient documentation

## 2021-11-29 DIAGNOSIS — I10 Essential (primary) hypertension: Secondary | ICD-10-CM | POA: Diagnosis not present

## 2021-11-29 LAB — TYPE AND SCREEN
ABO/RH(D): O POS
Antibody Screen: NEGATIVE

## 2021-11-29 LAB — I-STAT CHEM 8, ED
BUN: 44 mg/dL — ABNORMAL HIGH (ref 8–23)
Calcium, Ion: 1.15 mmol/L (ref 1.15–1.40)
Chloride: 102 mmol/L (ref 98–111)
Creatinine, Ser: 1.6 mg/dL — ABNORMAL HIGH (ref 0.44–1.00)
Glucose, Bld: 196 mg/dL — ABNORMAL HIGH (ref 70–99)
HCT: 46 % (ref 36.0–46.0)
Hemoglobin: 15.6 g/dL — ABNORMAL HIGH (ref 12.0–15.0)
Potassium: 5.7 mmol/L — ABNORMAL HIGH (ref 3.5–5.1)
Sodium: 138 mmol/L (ref 135–145)
TCO2: 27 mmol/L (ref 22–32)

## 2021-11-29 LAB — COMPREHENSIVE METABOLIC PANEL
ALT: 16 U/L (ref 0–44)
AST: 25 U/L (ref 15–41)
Albumin: 4.2 g/dL (ref 3.5–5.0)
Alkaline Phosphatase: 77 U/L (ref 38–126)
Anion gap: 10 (ref 5–15)
BUN: 26 mg/dL — ABNORMAL HIGH (ref 8–23)
CO2: 25 mmol/L (ref 22–32)
Calcium: 9.5 mg/dL (ref 8.9–10.3)
Chloride: 103 mmol/L (ref 98–111)
Creatinine, Ser: 1.75 mg/dL — ABNORMAL HIGH (ref 0.44–1.00)
GFR, Estimated: 31 mL/min — ABNORMAL LOW (ref >60)
Glucose, Bld: 198 mg/dL — ABNORMAL HIGH (ref 70–99)
Potassium: 4.4 mmol/L (ref 3.5–5.1)
Sodium: 138 mmol/L (ref 135–145)
Total Bilirubin: 0.7 mg/dL (ref 0.3–1.2)
Total Protein: 7.3 g/dL (ref 6.5–8.1)

## 2021-11-29 LAB — CBC WITH DIFFERENTIAL/PLATELET
Abs Immature Granulocytes: 0.08 10*3/uL — ABNORMAL HIGH (ref 0.00–0.07)
Basophils Absolute: 0 10*3/uL (ref 0.0–0.1)
Basophils Relative: 0 %
Eosinophils Absolute: 0 10*3/uL (ref 0.0–0.5)
Eosinophils Relative: 0 %
HCT: 43.2 % (ref 36.0–46.0)
Hemoglobin: 14.3 g/dL (ref 12.0–15.0)
Immature Granulocytes: 1 %
Lymphocytes Relative: 14 %
Lymphs Abs: 2.3 10*3/uL (ref 0.7–4.0)
MCH: 30.5 pg (ref 26.0–34.0)
MCHC: 33.1 g/dL (ref 30.0–36.0)
MCV: 92.1 fL (ref 80.0–100.0)
Monocytes Absolute: 1 10*3/uL (ref 0.1–1.0)
Monocytes Relative: 6 %
Neutro Abs: 13.2 10*3/uL — ABNORMAL HIGH (ref 1.7–7.7)
Neutrophils Relative %: 79 %
Platelets: 229 10*3/uL (ref 150–400)
RBC: 4.69 MIL/uL (ref 3.87–5.11)
RDW: 13.3 % (ref 11.5–15.5)
WBC: 16.7 10*3/uL — ABNORMAL HIGH (ref 4.0–10.5)
nRBC: 0 % (ref 0.0–0.2)

## 2021-11-29 LAB — ABO/RH: ABO/RH(D): O POS

## 2021-11-29 LAB — TROPONIN I (HIGH SENSITIVITY)
Troponin I (High Sensitivity): 6 ng/L (ref <18)
Troponin I (High Sensitivity): 6 ng/L (ref <18)

## 2021-11-29 LAB — LIPASE, BLOOD: Lipase: 29 U/L (ref 11–51)

## 2021-11-29 LAB — PROTIME-INR
INR: 1.1 (ref 0.8–1.2)
Prothrombin Time: 13.7 seconds (ref 11.4–15.2)

## 2021-11-29 MED ORDER — HYDROMORPHONE HCL 1 MG/ML IJ SOLN
0.5000 mg | Freq: Once | INTRAMUSCULAR | Status: AC
  Administered 2021-11-29: 0.5 mg via INTRAVENOUS
  Filled 2021-11-29: qty 1

## 2021-11-29 MED ORDER — AMOXICILLIN-POT CLAVULANATE 875-125 MG PO TABS: 1.0000 | ORAL_TABLET | Freq: Two times a day (BID) | ORAL | 0 refills | Status: DC

## 2021-11-29 MED ORDER — AMOXICILLIN-POT CLAVULANATE 875-125 MG PO TABS
1.0000 | ORAL_TABLET | Freq: Once | ORAL | Status: AC
  Administered 2021-11-29: 1 via ORAL
  Filled 2021-11-29: qty 1

## 2021-11-29 MED ORDER — IOHEXOL 350 MG/ML SOLN
100.0000 mL | Freq: Once | INTRAVENOUS | Status: AC | PRN
  Administered 2021-11-29: 100 mL via INTRAVENOUS

## 2021-11-29 MED ORDER — SODIUM ZIRCONIUM CYCLOSILICATE 5 G PO PACK
5.0000 g | PACK | Freq: Every day | ORAL | Status: DC
  Administered 2021-11-29: 5 g via ORAL
  Filled 2021-11-29: qty 1

## 2021-11-29 NOTE — ED Triage Notes (Signed)
Patient reports bright red bloody stools onset of 7pm last night and now having emesis. Denies any blood thinners. Reports some palpitations- worse when vomiting.

## 2021-11-29 NOTE — ED Notes (Signed)
Repeat ekg is done

## 2021-11-29 NOTE — ED Provider Notes (Signed)
[facility] EMERGENCY DEPARTMENT Provider Note   CSN: [id number] Arrival date & time: 11/29/21  0815     History Chief Complaint  Patient presents with   Rectal Bleeding    Meredith Hayden is a 71 y.o. female seen emergency department for evaluation of left lower quadrant abdominal pain along with bloody bowel movements since yesterday evening.  Patient additionally ports she said 6 episodes of nonbloody emesis.  Patient reports she is experiencing some nausea.  Denies any chest pain or shortness of breath.  Denies any fevers.  Denies any travel or new foods.  Patient reports she has a history of diverticulitis and has had to have part of her bowel removed.  Medical history includes diabetes, hypothyroidism, hyperlipidemia, hypertension.  Allergic to oxycodone and bactrim.  Denies any tobacco, EtOH, or drug use.   Rectal Bleeding Associated symptoms: abdominal pain and vomiting   Associated symptoms: no dizziness, no fever and no light-headedness       Past Medical History:  Diagnosis Date   Allergy    Anxiety    Arthritis    Asthma    Blood transfusion without reported diagnosis 2002   with knee replacement   Depression    Diabetes mellitus    Diabetes mellitus type 2 with retinopathy ([place])    Diverticulitis    Dyspareunia    vaginal dryness   Fibroid    reason for hysterectomy   GERD (gastroesophageal reflux disease)    Heart abnormality    prolonged P to T conductivity, partial right branch bundle block   Heart disease    Hyperlipidemia    Hypertension    IBS (irritable bowel syndrome)    Lichen sclerosus    OSA (obstructive sleep apnea)    Osteopenia 2009   Psoriasis    Sleep apnea    STD (sexually transmitted disease)    Hx HPV   Thyroid disease     Patient Active Problem List   Diagnosis Date Noted   Bradycardia 08/30/2021   Encounter for general adult medical examination with abnormal findings 08/21/2021   Abnormal  electrocardiogram (ECG) (EKG) 08/21/2021   Insulin-requiring or dependent type II diabetes mellitus ([place]) 07/17/2021   Irritable bowel syndrome with diarrhea 05/23/2020   Incontinence of feces 05/23/2020   Urinary incontinence 05/23/2020   Pure hypertriglyceridemia 07/28/2019   Routine general medical examination at a health care facility 01/07/2019   Irritable bowel syndrome with constipation 01/07/2019   Chronic renal disease, stage 3, moderately decreased glomerular filtration rate (GFR) between 30-59 mL/min/1.73 square meter ([place]) 12/18/2017   Medicare annual wellness visit, subsequent 03/06/2016   Visit for screening mammogram 03/06/2016   Diabetes mellitus with renal manifestations, uncontrolled 02/21/2016   Fatty liver disease, nonalcoholic 09/38/1829   PSVT (paroxysmal supraventricular tachycardia) ([place])    IRRITABLE BOWEL SYNDROME 03/05/2008   SLEEP APNEA 09/19/2007   Hypothyroidism 08/26/2007   OSTEOARTHRITIS 08/26/2007   Hyperlipidemia LDL goal <130 10/31/2006   OBESITY, MORBID 10/31/2006   Essential hypertension 10/31/2006   ALLERGIC RHINITIS 10/31/2006   ASTHMA 10/31/2006    Past Surgical History:  Procedure Laterality Date   ABDOMINAL HYSTERECTOMY  1995   TAH/LSO--Dr. Ubaldo Glassing   CESAREAN SECTION  1978   FOOT SURGERY Bilateral    NASAL SINUS SURGERY     OOPHORECTOMY     PARTIAL COLECTOMY     -RSO   ROTATOR CUFF REPAIR Left    TOTAL KNEE ARTHROPLASTY Bilateral 2002, 2004     OB History  Gravida  2   Para  2   Term  2   Preterm  0   AB  0   Living  2      SAB  0   IAB  0   Ectopic  0   Multiple  0   Live Births  2           Family History  Problem Relation Age of Onset   COPD Mother    Diabetes Mother    Heart attack Father 36   Hypertension Father    Heart attack Sister 60   Diabetes Sister    Hypertension Sister    Diabetes Brother    Hypertension Sister    Diabetes Sister    Hyperlipidemia Other        fhx    Hypertension Other        fhx   Cancer Other        ovarian/grandmother   Ovarian cancer Maternal Aunt    Diabetes Maternal Aunt    Stomach cancer Maternal Grandmother    Liver cancer Maternal Grandmother     Social History   Tobacco Use   Smoking status: Never   Smokeless tobacco: Never  Vaping Use   Vaping Use: Never used  Substance Use Topics   Alcohol use: No    Alcohol/week: 0.0 standard drinks    Comment: stopped in 2018, was social    Drug use: No    Home Medications Prior to Admission medications   Medication Sig Start Date End Date Taking? Authorizing Provider  amoxicillin-clavulanate (AUGMENTIN) 875-125 MG tablet Take 1 tablet by mouth every 12 (twelve) hours. 11/29/21  Yes Sherrell Puller, PA-C  aspirin 81 MG tablet Take 81 mg by mouth daily.     [provider]  BELSOMRA 10 MG TABS Take 10 mg by mouth at bedtime as needed. 10/30/21   [provider]  calcium carbonate (TUMS - DOSED IN MG ELEMENTAL CALCIUM) 500 MG chewable tablet Chew 1 tablet by mouth daily as needed for indigestion or heartburn.    [provider]  cetirizine (ZYRTEC) 10 MG tablet Take 10 mg by mouth daily.     [provider]  Cholecalciferol (VITAMIN D3) 125 MCG (5000 UT) TBDP Take 5,000 Units by mouth daily.    [provider]  Continuous Blood Gluc Receiver (FREESTYLE LIBRE 2 READER) DEVI 1 Act by Does not apply route daily. 07/17/21   Janith Lima, MD  Continuous Blood Gluc Sensor (FREESTYLE LIBRE 2 SENSOR) MISC 1 Act by Does not apply route daily. 07/17/21   Janith Lima, MD  cyanocobalamin 100 MCG tablet Take 100 mcg by mouth daily.      [provider]  dicyclomine (BENTYL) 10 MG capsule TAKE 1 CAPSULE BY MOUTH 3 TIMES  DAILY BEFORE MEALS 11/20/21   Janith Lima, MD  Finerenone (KERENDIA) 20 MG TABS Take 1 tablet by mouth daily. 10/05/21   Janith Lima, MD  fluticasone (FLONASE) 50 MCG/ACT nasal spray Place 1 spray into both  nostrils daily.    [provider]  gabapentin (NEURONTIN) 100 MG capsule Take 100 mg by mouth 2 (two) times daily.    [provider]  gabapentin (NEURONTIN) 600 MG tablet Take 1,200 mg by mouth 2 (two) times daily. 10/31/21   [provider]  Glucagon (GVOKE HYPOPEN 2-PACK) 1 MG/0.2ML SOAJ Inject 1 Act into the skin daily as needed. 10/13/20   Janith Lima, MD  HUMALOG KWIKPEN 100 UNIT/ML KwikPen INJECT SUBCUTANEOUSLY 16  UNITS 3 TIMES DAILY 10/31/21   Janith Lima, MD  Insulin Pen Needle (INSUPEN PEN NEEDLES) 33G X 4 MM MISC Inject 1 Act into the skin 4 (four) times daily - after meals and at bedtime. 03/06/21   Janith Lima, MD  ipratropium (ATROVENT HFA) 17 MCG/ACT inhaler USE 2 PUFFS EVERY 4 HOURS  AS NEEDED FOR WHEEZING. 03/26/19   Janith Lima, MD  Lancets [facility] DELICA PLUS [id number]) MISC USE AS DIRECTED TO CHECK BLOOD SUGAR 4 TIMES DAILY AFTER MEALS AND AT BEDTIME. 05/24/20   Janith Lima, MD  lisinopril (ZESTRIL) 20 MG tablet TAKE 1 TABLET BY MOUTH  DAILY 10/14/21   Janith Lima, MD  loperamide (IMODIUM) 2 MG capsule Take by mouth as needed for diarrhea or loose stools.    [provider]  LORazepam (ATIVAN) 1 MG tablet Take 1 mg by mouth every 8 (eight) hours as needed for anxiety.    [provider]  metoprolol tartrate (LOPRESSOR) 25 MG tablet TAKE 1 TABLET BY MOUTH  TWICE DAILY 08/17/21   Janith Lima, MD  omeprazole (PRILOSEC) 20 MG capsule Take 20 mg by mouth daily as needed.    [provider]  [facility] VERIO test strip CHECK BLOOD SUGAR 4 TIMES A DAY. 07/25/20   Janith Lima, MD  rosuvastatin (CRESTOR) 20 MG tablet TAKE 1 TABLET BY MOUTH  DAILY 08/28/21   Janith Lima, MD  sertraline (ZOLOFT) 100 MG tablet Take 200 mg by mouth daily.    [provider]  Simethicone 125 MG CAPS Take 1 capsule by mouth daily as needed.    [provider]  SYNTHROID 75 MCG tablet TAKE 1 TABLET DAILY BEFORE  BREAKFAST FOR HYPOTHYROIDISM 09/21/21   Janith Lima, MD  TOUJEO SOLOSTAR 300 UNIT/ML Solostar Pen INJECT 50 UNITS INTO THE  SKIN DAILY 10/31/21   Janith Lima, MD  traZODone (DESYREL) 50 MG tablet Take 100 mg by mouth at bedtime as needed. 11/07/21   [provider]  TRINTELLIX 20 MG TABS tablet Take 20 mg by mouth daily. 10/28/21   [provider]  zolpidem (AMBIEN) 5 MG tablet Take 5 mg by mouth at bedtime.    [provider]  azelastine (ASTELIN) 137 MCG/SPRAY nasal spray 1 spray by Nasal route 2 (two) times daily. Use in each nostril as directed   03/19/12  [provider]  budesonide-formoterol (SYMBICORT) 160-4.5 MCG/ACT inhaler Inhale 2 puffs into the lungs 2 (two) times daily.    03/19/12  [provider]  diphenhydrAMINE (SOMINEX) 25 MG tablet Take 25 mg by mouth at bedtime as needed.    03/19/12  [provider]  temazepam (RESTORIL) 30 MG capsule Take 30 mg by mouth at bedtime as needed.  03/19/12  [provider]    Allergies    Jardiance [empagliflozin], Oxycodone hcl, Oxycodone-acetaminophen, Sulfamethoxazole, Trulicity [dulaglutide], Codeine phosphate, Erythromycin, Metformin and related, Morphine sulfate, and Tylenol [acetaminophen]  Review of Systems   Review of Systems  Constitutional:  Negative for chills and fever.  HENT:  Negative for ear pain and sore throat.   Eyes:  Negative for pain and visual disturbance.  Respiratory:  Negative for cough and shortness of breath.   Cardiovascular:  Negative for chest pain and palpitations.  Gastrointestinal:  Positive for abdominal pain, blood in stool, diarrhea, hematochezia, nausea and vomiting.  Genitourinary:  Negative for dysuria, frequency, hematuria and urgency.  Musculoskeletal:  Negative for arthralgias and back pain.  Skin:  Negative for color change and rash.  Neurological:  Negative for dizziness, seizures, syncope and light-headedness.  All other systems  reviewed and are negative.  Physical Exam Updated Vital Signs BP 109/60   Pulse 80   Temp 98.3 F (36.8 C) (Oral)   Resp 16   Ht 5\' 1"  (1.549 m)   Wt 107.5 kg   LMP 12/31/1993   SpO2 97%   BMI 44.78 kg/m   Physical Exam Vitals and nursing note reviewed. Exam conducted with a chaperone present Cyril Mourning, [employer]).  Constitutional:      General: She is not in acute distress.    Appearance: Normal appearance. She is not toxic-appearing.  HENT:     Head: Normocephalic and atraumatic.     Mouth/Throat:     Mouth: Mucous membranes are moist.  Eyes:     General: No scleral icterus. Cardiovascular:     Rate and Rhythm: Normal rate and regular rhythm.  Pulmonary:     Effort: Pulmonary effort is normal. No respiratory distress.     Breath sounds: Normal breath sounds.  Abdominal:     General: Bowel sounds are normal.     Palpations: Abdomen is soft.     Tenderness: There is abdominal tenderness. There is no guarding or rebound.     Comments: Domino exam limited secondary to patient's body habitus.  Abdomen is soft.  Active bowel sounds appreciated in all 4 quadrants.  No overlying skin changes noted.  Tender to palpation in left lower quadrant.  Genitourinary:    Rectum: No anal fissure, external hemorrhoid or internal hemorrhoid.     Comments: Small amount of bright red blood seen Hemoccult exam.  Stool brown in coloration. Musculoskeletal:        General: No deformity.     Cervical back: Normal range of motion.  Skin:    General: Skin is warm and dry.  Neurological:     General: No focal deficit present.     Mental Status: She is alert. Mental status is at baseline.    ED Results / Procedures / Treatments   Labs (all labs ordered are listed, but only abnormal results are displayed) Labs Reviewed  CBC WITH DIFFERENTIAL/PLATELET - Abnormal; Notable for the following components:      Result Value   WBC 16.7 (*)    Neutro Abs 13.2 (*)    Abs Immature Granulocytes 0.08 (*)     All other components within normal limits  COMPREHENSIVE METABOLIC PANEL - Abnormal; Notable for the following components:   Glucose, Bld 198 (*)    BUN 26 (*)    Creatinine, Ser 1.75 (*)    GFR, Estimated 31 (*)    All other components within normal limits  I-STAT CHEM 8, ED - Abnormal; Notable for the following components:   Potassium 5.7 (*)    BUN 44 (*)    Creatinine, Ser 1.60 (*)    Glucose, Bld 196 (*)    Hemoglobin 15.6 (*)    All other components within normal limits  RESP PANEL BY RT-PCR (FLU A&B, COVID) ARPGX2  LIPASE, BLOOD  PROTIME-INR  POC OCCULT BLOOD, ED  TYPE AND SCREEN  ABO/RH  TROPONIN I (HIGH SENSITIVITY)  TROPONIN I (HIGH SENSITIVITY)    EKG None  Radiology CT ANGIO GI BLEED  Result Date: 11/29/2021 CLINICAL DATA:  Bright red blood in stool, emesis EXAM: CTA ABDOMEN AND PELVIS WITHOUT AND WITH CONTRAST TECHNIQUE:  Multidetector CT imaging of the abdomen and pelvis was performed using the standard protocol during bolus administration of intravenous contrast. Multiplanar reconstructed images and MIPs were obtained and reviewed to evaluate the vascular anatomy. CONTRAST:  167mL OMNIPAQUE IOHEXOL 350 MG/ML SOLN COMPARISON:  CT abdomen/pelvis 05/25/2020 FINDINGS: VASCULAR Aorta: Normal caliber aorta without aneurysm, dissection, vasculitis or significant stenosis. There is mild scattered calcified atherosclerotic plaque. Celiac: Patent without evidence of aneurysm, dissection, vasculitis or significant stenosis. SMA: Patent without evidence of aneurysm, dissection, vasculitis or significant stenosis. Renals: There is mild calcified atherosclerotic plaque at the origins of the renal arteries without significant stenosis. There is no evidence of aneurysm, dissection, vasculitis, or fibromuscular dysplasia. IMA: Patent without evidence of aneurysm, dissection, vasculitis or significant stenosis. Inflow: Patent without evidence of aneurysm, dissection, vasculitis or  significant stenosis. Proximal Outflow: Bilateral common femoral and visualized portions of the superficial and profunda femoral arteries are patent without evidence of aneurysm, dissection, vasculitis or significant stenosis. Veins: The main portal and splenic veins are patent on the venous phase images. The IVC is patent. There is no evidence of active GI bleed. Review of the MIP images confirms the above findings. NON-VASCULAR Lower chest: The lung bases are clear. The imaged heart is unremarkable. Hepatobiliary: The liver and gallbladder are unremarkable. There is no biliary ductal dilatation. Pancreas: There is fatty atrophy of the pancreatic head. There are no focal lesions or contour abnormalities. There is no main pancreatic ductal dilatation or peripancreatic inflammatory change. Spleen: Unremarkable. Adrenals/Urinary Tract: The adrenals are unremarkable. A small hypodense lesion in the right upper pole most likely reflects a small cyst. The kidneys are otherwise unremarkable, with no other focal lesion, stone, hydronephrosis, or hydroureter. The bladder is decompressed but grossly unremarkable. Stomach/Bowel: Stomach is unremarkable. There is no evidence of bowel obstruction. There is marked wall thickening involving the descending and sigmoid colon with significant surrounding inflammatory change and mucosal hyperemia consistent with colitis. The remainder of the bowel is normal in appearance. Lymphatic: There is no abdominal or pelvic lymphadenopathy. Reproductive: The uterus is surgically absent. There is no adnexal mass. Other: There is trace free fluid in the left pericolic gutter, likely reactive. There is no ascites elsewhere. There is no free intraperitoneal air. Musculoskeletal: There is no acute osseous abnormality or aggressive osseous lesion. IMPRESSION: 1. No evidence of active GI bleed. 2. Marked wall thickening and surrounding inflammatory change involving the descending and sigmoid colon  consistent with infectious or inflammatory colitis. Recommend follow-up CT abdomen/pelvis after resolution of symptoms to exclude underlying mass lesion. Aortic Atherosclerosis (ICD10-I70.0). Electronically Signed   By: Valetta Mole M.D.   On: 11/29/2021 10:24    Procedures Procedures   Medications Ordered in ED Medications  sodium zirconium cyclosilicate (LOKELMA) packet 5 g (5 g Oral Given 11/29/21 1551)  amoxicillin-clavulanate (AUGMENTIN) 875-125 MG per tablet 1 tablet (has no administration in time range)  HYDROmorphone (DILAUDID) injection 0.5 mg (has no administration in time range)  iohexol (OMNIPAQUE) 350 MG/ML injection 100 mL (100 mLs Intravenous Contrast Given 11/29/21 1010)  HYDROmorphone (DILAUDID) injection 0.5 mg (0.5 mg Intravenous Given 11/29/21 1438)    ED Course  I have reviewed the triage vital signs and the nursing notes.  Pertinent labs & imaging results that were available during my care of the patient were reviewed by me and considered in my medical decision making (see chart for details)  71 year old female presents to the emergency department for evaluation of quadrant abdominal pain with hematochezia since yesterday.  Differential diagnosis includes was not limited to diverticulitis, colitis, gastroenteritis, mesenteric ischemia, bowel perforation.  On physical exam, patient has left lower quadrant tenderness palpation.  No guarding or rebound.  No overlying skin changes.  Patient has active bowel sounds in all 4 quadrants.  Vital signs stable.  Patient is normotensive with a normal heart rate.  Afebrile.  Patient satting 95% on room air.  I personally reviewed and interpreted the patient's labs and imaging.  In triage, CT angio GI bleed was ordered and showed no evidence of active GI bleed but marked wall thickening and surrounding obligatory changing with the descending and sigmoid colon consistent with infectious or inflammatory colitis.  CBC shows leukocytosis  with a left shift.  No anemia.  CMP shows mildly elevated glucose at 198.  Creatinine 1.75.  At Chem-8 shows potassium 5.7.  CMP shows potassium 4.4 which was drawn before.  Will order Lokelma.  Slightly elevated from her previous one 3 months ago at 1.64.  INR within normal range.  Lipase negative.  Type and screen placed.  Troponins placed because patient was complaining of chest pain in triage, but upon further investigation, patient denies any chest pain.  Troponins flat.  Patient reports she has a history of QT prolongation.  EKG shows borderline QT prolongation.  Will defer Zofran.  Given the CT findings, will start patient on Augmentin as an antibiotic.  P.o. challenge placed.  Patient has passed p.o. challenge without vomiting.  Dilaudid given for pain, patient reports she is feeling much better.  Hemoccult exam performed, patient will likely have a false positive as she is having bright red blood per rectum with soft brown stool.    Discussed lab and imaging findings with patient.  Advised her to follow-up with her PCP within the week for recheck of her potassium level.  Discussed with her that we will be prescribing her Augmentin, an antibiotic, to take as prescribed.  I have asked her to take ibuprofen or Tylenol as needed for pain.  Return precautions discussed.  Patient agrees to plan.  Patient is stable being discharged home in good condition.  I discussed this case with my attending physician who cosigned this note including patient's presenting symptoms, physical exam, and planned diagnostics and interventions. Attending physician stated agreement with plan or made changes to plan which were implemented.     MDM Rules/Calculators/A&P                          Final Clinical Impression(s) / ED Diagnoses Final diagnoses:  Colitis    Rx / DC Orders ED Discharge Orders          Ordered    amoxicillin-clavulanate (AUGMENTIN) 875-125 MG tablet  Every 12 hours        11/29/21 1754              Sherrell Puller, PA-C 11/29/21 1801    Elnora Morrison, MD 11/30/21 1001

## 2021-11-29 NOTE — ED Provider Notes (Signed)
Emergency Medicine Provider Triage Evaluation Note  Meredith Hayden , a 71 y.o. female  was evaluated in triage.  Pt complains of rectal bleeding.  Began about 14 hours PTA.  Having moderate bright red blood per rectum with clots.  She is not anticoagulated.  Has some generalized lower abdominal pain going into her back.  History of diverticulitis, s/p colectomy per patient.  Also having some palpitations, weakness.  Feels very fatigued. No CP, SOB.  Review of Systems  Positive: Palpitations, rectal bleeding, weakness Negative: Fever, CP, emesis  Physical Exam  BP 131/72 (BP Location: Left Arm)   Pulse 99   Temp 98.3 F (36.8 C) (Oral)   Resp 17   Ht 5\' 1"  (1.549 m)   Wt 107.5 kg   LMP 12/31/1993   SpO2 98%   BMI 44.78 kg/m  Gen:   Awake, no distress, appear pale Resp:  Normal effort  Cardiac: 2+ radial pulses Bl MSK:   Moves extremities without difficulty  ABD:  Diffusely tender Other:    Medical Decision Making  Medically screening exam initiated at 8:45 AM.  Appropriate orders placed.  Meredith Hayden was informed that the remainder of the evaluation will be completed by another provider, this initial triage assessment does not replace that evaluation, and the importance of remaining in the ED until their evaluation is complete.  ABD pain, rectal bleeding, weakness, palpitations   Meredith Hayden A, PA-C 11/29/21 0847    Lennice Sites, DO 11/29/21 9201

## 2021-11-29 NOTE — Discharge Instructions (Addendum)
You are seen here for evaluation of abdominal pain and rectal bleeding.  It is significant you have colitis.  You been prescribed Augmentin, an antibiotic, to take as prescribed and complete the entirety of the course.  You can take Tylenol and/or ibuprofen as needed for pain.  Please make sure to stay well-hydrated and obtain plenty rest.  Please follow-up with your PCP in a week for recheck of your potassium.  Additional information on colitis included in this discharge paperwork.  If you have any concern, new or worsening symptoms, please return to the nearest emergency department for evaluation.

## 2021-11-30 LAB — POC OCCULT BLOOD, ED: Fecal Occult Bld: POSITIVE — AB

## 2021-12-04 ENCOUNTER — Ambulatory Visit: Payer: Medicare Other | Admitting: Podiatry

## 2021-12-05 ENCOUNTER — Ambulatory Visit: Payer: Medicare Other

## 2021-12-06 ENCOUNTER — Encounter: Payer: Self-pay | Admitting: Internal Medicine

## 2021-12-07 ENCOUNTER — Inpatient Hospital Stay: Payer: Medicare Other | Admitting: Internal Medicine

## 2021-12-13 ENCOUNTER — Encounter: Payer: Self-pay | Admitting: Internal Medicine

## 2021-12-13 ENCOUNTER — Ambulatory Visit (INDEPENDENT_AMBULATORY_CARE_PROVIDER_SITE_OTHER): Payer: Medicare Other | Admitting: Internal Medicine

## 2021-12-13 ENCOUNTER — Other Ambulatory Visit: Payer: Self-pay

## 2021-12-13 VITALS — BP 126/78 | HR 62 | Temp 98.2°F | Ht 61.0 in | Wt 223.0 lb

## 2021-12-13 DIAGNOSIS — N1831 Chronic kidney disease, stage 3a: Secondary | ICD-10-CM

## 2021-12-13 DIAGNOSIS — Z794 Long term (current) use of insulin: Secondary | ICD-10-CM

## 2021-12-13 DIAGNOSIS — I10 Essential (primary) hypertension: Secondary | ICD-10-CM

## 2021-12-13 DIAGNOSIS — R197 Diarrhea, unspecified: Secondary | ICD-10-CM | POA: Insufficient documentation

## 2021-12-13 DIAGNOSIS — E119 Type 2 diabetes mellitus without complications: Secondary | ICD-10-CM | POA: Diagnosis not present

## 2021-12-13 DIAGNOSIS — E039 Hypothyroidism, unspecified: Secondary | ICD-10-CM

## 2021-12-13 DIAGNOSIS — K5732 Diverticulitis of large intestine without perforation or abscess without bleeding: Secondary | ICD-10-CM | POA: Insufficient documentation

## 2021-12-13 DIAGNOSIS — Z23 Encounter for immunization: Secondary | ICD-10-CM

## 2021-12-13 LAB — CBC WITH DIFFERENTIAL/PLATELET
Basophils Absolute: 0.1 10*3/uL (ref 0.0–0.1)
Basophils Relative: 0.9 % (ref 0.0–3.0)
Eosinophils Absolute: 0.2 10*3/uL (ref 0.0–0.7)
Eosinophils Relative: 2 % (ref 0.0–5.0)
HCT: 40.1 % (ref 36.0–46.0)
Hemoglobin: 13.3 g/dL (ref 12.0–15.0)
Lymphocytes Relative: 25.5 % (ref 12.0–46.0)
Lymphs Abs: 2.5 10*3/uL (ref 0.7–4.0)
MCHC: 33 g/dL (ref 30.0–36.0)
MCV: 91.8 fl (ref 78.0–100.0)
Monocytes Absolute: 0.7 10*3/uL (ref 0.1–1.0)
Monocytes Relative: 6.6 % (ref 3.0–12.0)
Neutro Abs: 6.5 10*3/uL (ref 1.4–7.7)
Neutrophils Relative %: 65 % (ref 43.0–77.0)
Platelets: 226 10*3/uL (ref 150.0–400.0)
RBC: 4.37 Mil/uL (ref 3.87–5.11)
RDW: 13.8 % (ref 11.5–15.5)
WBC: 9.9 10*3/uL (ref 4.0–10.5)

## 2021-12-13 LAB — BASIC METABOLIC PANEL
BUN: 21 mg/dL (ref 6–23)
CO2: 27 mEq/L (ref 19–32)
Calcium: 9.5 mg/dL (ref 8.4–10.5)
Chloride: 101 mEq/L (ref 96–112)
Creatinine, Ser: 1.39 mg/dL — ABNORMAL HIGH (ref 0.40–1.20)
GFR: 38.8 mL/min — ABNORMAL LOW (ref >60.00)
Glucose, Bld: 164 mg/dL — ABNORMAL HIGH (ref 70–99)
Potassium: 4.7 mEq/L (ref 3.5–5.1)
Sodium: 136 mEq/L (ref 135–145)

## 2021-12-13 LAB — HEMOGLOBIN A1C: Hgb A1c MFr Bld: 7.6 % — ABNORMAL HIGH (ref 4.6–6.5)

## 2021-12-13 LAB — TSH: TSH: 0.5 u[IU]/mL (ref 0.35–5.50)

## 2021-12-13 LAB — C-REACTIVE PROTEIN: CRP: <1 mg/dL (ref 0.5–20.0)

## 2021-12-13 NOTE — Progress Notes (Signed)
Subjective:  Patient ID: Meredith Hayden, female    DOB: [birth year]  Age: 71 y.o. MRN: [id number]  CC: Hypothyroidism and Hypertension   This visit occurred during the SARS-CoV-2 public health emergency.  Safety protocols were in place, including screening questions prior to the visit, additional usage of staff PPE, and extensive cleaning of exam room while observing appropriate contact time as indicated for disinfecting solutions.    HPI Meredith Hayden presents for f/up -  She complains of a 2-week history of diarrhea.  She recently completed a course of Augmentin for diverticulitis.  The abdominal pain is better.  She describes about 3 watery, explosive bowel movements each day.  She denies fever, chills, or bright red blood per rectum.  She has had a few episodes of nausea but no vomiting.  She has had a poor appetite and weight loss.  Outpatient Medications Prior to Visit  Medication Sig Dispense Refill   aspirin 81 MG tablet Take 81 mg by mouth daily.      calcium carbonate (TUMS - DOSED IN MG ELEMENTAL CALCIUM) 500 MG chewable tablet Chew 1 tablet by mouth daily as needed for indigestion or heartburn.     cetirizine (ZYRTEC) 10 MG tablet Take 10 mg by mouth daily.      Cholecalciferol (VITAMIN D3) 125 MCG (5000 UT) TBDP Take 5,000 Units by mouth daily.     Continuous Blood Gluc Receiver (FREESTYLE LIBRE 2 READER) DEVI 1 Act by Does not apply route daily. 2 each 5   Continuous Blood Gluc Sensor (FREESTYLE LIBRE 2 SENSOR) MISC 1 Act by Does not apply route daily. 2 each 5   cyanocobalamin 100 MCG tablet Take 100 mcg by mouth daily.       dicyclomine (BENTYL) 10 MG capsule TAKE 1 CAPSULE BY MOUTH 3 TIMES  DAILY BEFORE MEALS 270 capsule 0   Finerenone (KERENDIA) 20 MG TABS Take 1 tablet by mouth daily. 90 tablet 1   fluticasone (FLONASE) 50 MCG/ACT nasal spray Place 1 spray into both nostrils daily.     gabapentin (NEURONTIN) 600 MG tablet Take 1,200 mg by mouth 2 (two)  times daily.     Glucagon (GVOKE HYPOPEN 2-PACK) 1 MG/0.2ML SOAJ Inject 1 Act into the skin daily as needed. 2 mL 5   HUMALOG KWIKPEN 100 UNIT/ML KwikPen INJECT SUBCUTANEOUSLY 16  UNITS 3 TIMES DAILY 45 mL 3   Insulin Pen Needle (INSUPEN PEN NEEDLES) 33G X 4 MM MISC Inject 1 Act into the skin 4 (four) times daily - after meals and at bedtime. 300 each 5   ipratropium (ATROVENT HFA) 17 MCG/ACT inhaler USE 2 PUFFS EVERY 4 HOURS  AS NEEDED FOR WHEEZING. 12.9 g 3   Lancets (ONETOUCH DELICA PLUS [id number]) MISC USE AS DIRECTED TO CHECK BLOOD SUGAR 4 TIMES DAILY AFTER MEALS AND AT BEDTIME. 200 each 4   lisinopril (ZESTRIL) 20 MG tablet TAKE 1 TABLET BY MOUTH  DAILY 90 tablet 1   loperamide (IMODIUM) 2 MG capsule Take by mouth as needed for diarrhea or loose stools.     metoprolol tartrate (LOPRESSOR) 25 MG tablet TAKE 1 TABLET BY MOUTH  TWICE DAILY 180 tablet 0   omeprazole (PRILOSEC) 20 MG capsule Take 20 mg by mouth daily as needed.     ONETOUCH VERIO test strip CHECK BLOOD SUGAR 4 TIMES A DAY. 200 strip 5   rosuvastatin (CRESTOR) 20 MG tablet TAKE 1 TABLET BY MOUTH  DAILY 90 tablet 1   Simethicone 125  MG CAPS Take 1 capsule by mouth daily as needed.     SYNTHROID 75 MCG tablet TAKE 1 TABLET DAILY BEFORE BREAKFAST FOR HYPOTHYROIDISM 90 tablet 1   TOUJEO SOLOSTAR 300 UNIT/ML Solostar Pen INJECT 50 UNITS INTO THE  SKIN DAILY 18 mL 3   TRINTELLIX 20 MG TABS tablet Take 20 mg by mouth daily.     zolpidem (AMBIEN) 5 MG tablet Take 5 mg by mouth at bedtime.     amoxicillin-clavulanate (AUGMENTIN) 875-125 MG tablet Take 1 tablet by mouth every 12 (twelve) hours. 14 tablet 0   BELSOMRA 10 MG TABS Take 10 mg by mouth at bedtime as needed.     gabapentin (NEURONTIN) 100 MG capsule Take 100 mg by mouth 2 (two) times daily.     LORazepam (ATIVAN) 1 MG tablet Take 1 mg by mouth every 8 (eight) hours as needed for anxiety.     sertraline (ZOLOFT) 100 MG tablet Take 200 mg by mouth daily.     traZODone (DESYREL)  50 MG tablet Take 100 mg by mouth at bedtime as needed.     No facility-administered medications prior to visit.    ROS Review of Systems  Constitutional:  Positive for appetite change and unexpected weight change. Negative for chills, diaphoresis, fatigue and fever.  HENT: Negative.    Eyes: Negative.   Respiratory:  Negative for cough, chest tightness, shortness of breath and wheezing.   Cardiovascular:  Negative for chest pain, palpitations and leg swelling.  Gastrointestinal:  Positive for abdominal pain, diarrhea and nausea. Negative for abdominal distention, blood in stool, constipation and vomiting.  Endocrine: Negative.   Genitourinary: Negative.  Negative for decreased urine volume, difficulty urinating, dysuria, hematuria and urgency.  Musculoskeletal: Negative.   Skin: Negative.   Neurological:  Negative for dizziness and weakness.  Hematological:  Negative for adenopathy. Does not bruise/bleed easily.  Psychiatric/Behavioral: Negative.     Objective:  BP 126/78 (BP Location: Right Arm, Patient Position: Sitting, Cuff Size: Large)    Pulse 62    Temp 98.2 F (36.8 C) (Oral)    Ht 5\' 1"  (1.549 m)    Wt 223 lb (101.2 kg)    LMP 12/31/1993    SpO2 95%    BMI 42.14 kg/m   BP Readings from Last 3 Encounters:  12/13/21 126/78  11/29/21 125/79  10/05/21 124/86    Wt Readings from Last 3 Encounters:  12/13/21 223 lb (101.2 kg)  11/29/21 237 lb (107.5 kg)  10/05/21 237 lb (107.5 kg)    Physical Exam Vitals reviewed.  Constitutional:      General: She is not in acute distress.    Appearance: She is obese. She is not ill-appearing, toxic-appearing or diaphoretic.  Eyes:     General: No scleral icterus.    Conjunctiva/sclera: Conjunctivae normal.  Cardiovascular:     Rate and Rhythm: Normal rate and regular rhythm.     Heart sounds: No murmur heard. Pulmonary:     Effort: Pulmonary effort is normal.     Breath sounds: No stridor. No wheezing, rhonchi or rales.   Abdominal:     General: Abdomen is protuberant. There is no distension.     Palpations: There is no mass.     Tenderness: There is abdominal tenderness in the left lower quadrant. There is no guarding.     Hernia: No hernia is present.  Musculoskeletal:     Cervical back: Neck supple.  Lymphadenopathy:     Cervical: No cervical adenopathy.  Skin:    General: Skin is warm and dry.  Neurological:     General: No focal deficit present.  Psychiatric:        Mood and Affect: Mood normal.        Behavior: Behavior normal.    Lab Results  Component Value Date   WBC 9.9 12/13/2021   HGB 13.3 12/13/2021   HCT 40.1 12/13/2021   PLT 226.0 12/13/2021   GLUCOSE 164 (H) 12/13/2021   CHOL 184 08/21/2021   TRIG 266.0 (H) 08/21/2021   HDL 40.50 08/21/2021   LDLDIRECT 122.0 08/21/2021   LDLCALC 60 02/10/2020   ALT 16 11/29/2021   AST 25 11/29/2021   NA 136 12/13/2021   K 4.7 12/13/2021   CL 101 12/13/2021   CREATININE 1.39 (H) 12/13/2021   BUN 21 12/13/2021   CO2 27 12/13/2021   TSH 0.50 12/13/2021   INR 1.1 11/29/2021   HGBA1C 7.6 (H) 12/13/2021   MICROALBUR 0.9 08/21/2021    CT ANGIO GI BLEED  Result Date: 11/29/2021 CLINICAL DATA:  Bright red blood in stool, emesis EXAM: CTA ABDOMEN AND PELVIS WITHOUT AND WITH CONTRAST TECHNIQUE: Multidetector CT imaging of the abdomen and pelvis was performed using the standard protocol during bolus administration of intravenous contrast. Multiplanar reconstructed images and MIPs were obtained and reviewed to evaluate the vascular anatomy. CONTRAST:  135mL OMNIPAQUE IOHEXOL 350 MG/ML SOLN COMPARISON:  CT abdomen/pelvis 05/25/2020 FINDINGS: VASCULAR Aorta: Normal caliber aorta without aneurysm, dissection, vasculitis or significant stenosis. There is mild scattered calcified atherosclerotic plaque. Celiac: Patent without evidence of aneurysm, dissection, vasculitis or significant stenosis. SMA: Patent without evidence of aneurysm, dissection,  vasculitis or significant stenosis. Renals: There is mild calcified atherosclerotic plaque at the origins of the renal arteries without significant stenosis. There is no evidence of aneurysm, dissection, vasculitis, or fibromuscular dysplasia. IMA: Patent without evidence of aneurysm, dissection, vasculitis or significant stenosis. Inflow: Patent without evidence of aneurysm, dissection, vasculitis or significant stenosis. Proximal Outflow: Bilateral common femoral and visualized portions of the superficial and profunda femoral arteries are patent without evidence of aneurysm, dissection, vasculitis or significant stenosis. Veins: The main portal and splenic veins are patent on the venous phase images. The IVC is patent. There is no evidence of active GI bleed. Review of the MIP images confirms the above findings. NON-VASCULAR Lower chest: The lung bases are clear. The imaged heart is unremarkable. Hepatobiliary: The liver and gallbladder are unremarkable. There is no biliary ductal dilatation. Pancreas: There is fatty atrophy of the pancreatic head. There are no focal lesions or contour abnormalities. There is no main pancreatic ductal dilatation or peripancreatic inflammatory change. Spleen: Unremarkable. Adrenals/Urinary Tract: The adrenals are unremarkable. A small hypodense lesion in the right upper pole most likely reflects a small cyst. The kidneys are otherwise unremarkable, with no other focal lesion, stone, hydronephrosis, or hydroureter. The bladder is decompressed but grossly unremarkable. Stomach/Bowel: Stomach is unremarkable. There is no evidence of bowel obstruction. There is marked wall thickening involving the descending and sigmoid colon with significant surrounding inflammatory change and mucosal hyperemia consistent with colitis. The remainder of the bowel is normal in appearance. Lymphatic: There is no abdominal or pelvic lymphadenopathy. Reproductive: The uterus is surgically absent. There is  no adnexal mass. Other: There is trace free fluid in the left pericolic gutter, likely reactive. There is no ascites elsewhere. There is no free intraperitoneal air. Musculoskeletal: There is no acute osseous abnormality or aggressive osseous lesion. IMPRESSION: 1. No evidence of active  GI bleed. 2. Marked wall thickening and surrounding inflammatory change involving the descending and sigmoid colon consistent with infectious or inflammatory colitis. Recommend follow-up CT abdomen/pelvis after resolution of symptoms to exclude underlying mass lesion. Aortic Atherosclerosis (ICD10-I70.0). Electronically Signed   By: Valetta Mole M.D.   On: 11/29/2021 10:24    Assessment & Plan:   Atalya was seen today for hypothyroidism and hypertension.  Diagnoses and all orders for this visit:  Stage 3a chronic kidney disease ([place])- Her renal function is stable. -     Basic metabolic panel; Future -     Basic metabolic panel  Acquired hypothyroidism- Her TSH is in the normal range.  She will stay on the current dose of levothyroxine. -     TSH; Future -     TSH  Essential hypertension- Her blood pressure is adequately well controlled. -     CBC with Differential/Platelet; Future -     Basic metabolic panel; Future -     TSH; Future -     TSH -     Basic metabolic panel -     CBC with Differential/Platelet  Flu vaccine need -     Flu Vaccine QUAD High Dose(Fluad)  Diarrhea of presumed infectious origin- Will screen for IBD and C. difficile infection. -     Fecal lactoferrin, quant; Future -     Clostridium difficile EIA; Future -     CALPROTECTIN; Future -     CALPROTECTIN -     Clostridium difficile EIA -     Fecal lactoferrin, quant  Diverticulitis of colon without hemorrhage- Based on her symptoms, exam, and lab work this has resolved.  She has abdominal tenderness but no other signs of an acute abdominal process. -     C-reactive protein; Future -     C-reactive protein  Insulin-requiring or  dependent type II diabetes mellitus ([place])- Her blood sugar is adequately well controlled. -     Hemoglobin A1c; Future -     Hemoglobin A1c   I have discontinued Aldine Contes "Chris"'s sertraline, LORazepam, Belsomra, traZODone, and amoxicillin-clavulanate. I am also having her maintain her aspirin, cetirizine, cyanocobalamin, Vitamin D3, ipratropium, OneTouch Delica Plus [id number], OneTouch Verio, Gvoke HypoPen 2-Pack, Insupen Pen Needles, loperamide, Simethicone, omeprazole, calcium carbonate, fluticasone, zolpidem, FreeStyle Libre 2 Sensor, [employer] 2 Reader, metoprolol tartrate, rosuvastatin, Synthroid, Kerendia, lisinopril, Toujeo SoloStar, [employer] KwikPen, dicyclomine, gabapentin, and Trintellix.  No orders of the defined types were placed in this encounter.     Follow-up: Return in about 4 weeks (around 01/10/2022).  Scarlette Calico, MD

## 2021-12-13 NOTE — Patient Instructions (Signed)
Diarrhea, Adult ?Diarrhea is frequent loose and watery bowel movements. Diarrhea can make you feel weak and cause you to become dehydrated. Dehydration can make you tired and thirsty, cause you to have a dry mouth, and decrease how often you urinate. ?Diarrhea typically lasts 2-3 days. However, it can last longer if it is a sign of something more serious. It is important to treat your diarrhea as told by your health care provider. ?Follow these instructions at home: ?Eating and drinking ?  ?Follow these recommendations as told by your health care provider: ?Take an oral rehydration solution (ORS). This is an over-the-counter medicine that helps return your body to its normal balance of nutrients and water. It is found at pharmacies and retail stores. ?Drink plenty of fluids, such as water, ice chips, diluted fruit juice, and low-calorie sports drinks. You can drink milk also, if desired. ?Avoid drinking fluids that contain a lot of sugar or caffeine, such as energy drinks, sports drinks, and soda. ?Eat bland, easy-to-digest foods in small amounts as you are able. These foods include bananas, applesauce, rice, lean meats, toast, and crackers. ?Avoid alcohol. ?Avoid spicy or fatty foods. ? ?Medicines ?Take over-the-counter and prescription medicines only as told by your health care provider. ?If you were prescribed an antibiotic medicine, take it as told by your health care provider. Do not stop using the antibiotic even if you start to feel better. ?General instructions ? ?Wash your hands often using soap and water. If soap and water are not available, use a hand sanitizer. Others in the household should wash their hands as well. Hands should be washed: ?After using the toilet or changing a diaper. ?Before preparing, cooking, or serving food. ?While caring for a sick person or while visiting someone in a hospital. ?Drink enough fluid to keep your urine pale yellow. ?Rest at home while you recover. ?Watch your  condition for any changes. ?Take a warm bath to relieve any burning or pain from frequent diarrhea episodes. ?Keep all follow-up visits as told by your health care provider. This is important. ?Contact a health care provider if: ?You have a fever. ?Your diarrhea gets worse. ?You have new symptoms. ?You cannot keep fluids down. ?You feel light-headed or dizzy. ?You have a headache. ?You have muscle cramps. ?Get help right away if: ?You have chest pain. ?You feel extremely weak or you faint. ?You have bloody or black stools or stools that look like tar. ?You have severe pain, cramping, or bloating in your abdomen. ?You have trouble breathing or you are breathing very quickly. ?Your heart is beating very quickly. ?Your skin feels cold and clammy. ?You feel confused. ?You have signs of dehydration, such as: ?Dark urine, very little urine, or no urine. ?Cracked lips. ?Dry mouth. ?Sunken eyes. ?Sleepiness. ?Weakness. ?Summary ?Diarrhea is frequent loose and sometimes watery bowel movements. Diarrhea can make you feel weak and cause you to become dehydrated. ?Drink enough fluids to keep your urine pale yellow. ?Make sure that you wash your hands after using the toilet. If soap and water are not available, use hand sanitizer. ?Contact a health care provider if your diarrhea gets worse or you have new symptoms. ?Get help right away if you have signs of dehydration. ?This information is not intended to replace advice given to you by your health care provider. Make sure you discuss any questions you have with your health care provider. ?Document Revised: 06/28/2021 Document Reviewed: 06/28/2021 ?Elsevier Patient Education ? 2022 [employer] ? ?

## 2021-12-22 DIAGNOSIS — E1122 Type 2 diabetes mellitus with diabetic chronic kidney disease: Secondary | ICD-10-CM | POA: Diagnosis not present

## 2021-12-24 ENCOUNTER — Other Ambulatory Visit: Payer: Self-pay | Admitting: Internal Medicine

## 2021-12-24 DIAGNOSIS — I10 Essential (primary) hypertension: Secondary | ICD-10-CM

## 2021-12-28 ENCOUNTER — Encounter (HOSPITAL_COMMUNITY): Payer: Self-pay | Admitting: Radiology

## 2021-12-28 ENCOUNTER — Ambulatory Visit: Payer: Medicare Other | Admitting: Podiatry

## 2022-01-08 ENCOUNTER — Encounter: Payer: Self-pay | Admitting: Internal Medicine

## 2022-01-09 ENCOUNTER — Inpatient Hospital Stay: Payer: Medicare Other | Admitting: Internal Medicine

## 2022-01-15 ENCOUNTER — Telehealth: Payer: Medicare Other

## 2022-01-18 ENCOUNTER — Other Ambulatory Visit: Payer: Self-pay

## 2022-01-18 ENCOUNTER — Ambulatory Visit (INDEPENDENT_AMBULATORY_CARE_PROVIDER_SITE_OTHER): Payer: Medicare Other

## 2022-01-18 DIAGNOSIS — Z0000 Encounter for general adult medical examination without abnormal findings: Secondary | ICD-10-CM

## 2022-01-18 NOTE — Progress Notes (Signed)
I connected with Meredith Hayden today by telephone and verified that I am speaking with the correct person using two identifiers. Location patient: home Location provider: work Persons participating in the virtual visit: patient, provider.   I discussed the limitations, risks, security and privacy concerns of performing an evaluation and management service by telephone and the availability of in person appointments. I also discussed with the patient that there may be a patient responsible charge related to this service. The patient expressed understanding and verbally consented to this telephonic visit.    Interactive audio and video telecommunications were attempted between this provider and patient, however failed, due to patient having technical difficulties OR patient did not have access to video capability.  We continued and completed visit with audio only.  Some vital signs may be absent or patient reported.   Time Spent with patient on telephone encounter: 40 minutes  Subjective:   Meredith Hayden is a 72 y.o. female who presents for Medicare Annual (Subsequent) preventive examination.  Review of Systems     Cardiac Risk Factors include: advanced age (>28men, >26 women);diabetes mellitus;dyslipidemia;family history of premature cardiovascular disease;hypertension;obesity (BMI >30kg/m2);sedentary lifestyle     Objective:    There were no vitals filed for this visit. There is no height or weight on file to calculate BMI.  Advanced Directives 01/18/2022 01/02/2021 01/07/2019 01/01/2018 11/14/2016 01/25/2015  Does Patient Have a Medical Advance Directive? No No No No No No  Does patient want to make changes to medical advance directive? - - - Yes (ED - Information included in AVS) - -  Would patient like information on creating a medical advance directive? No - Patient declined No - Patient declined No - Patient declined - - No - patient declined information    Current Medications  (verified) Outpatient Encounter Medications as of 01/18/2022  Medication Sig   aspirin 81 MG tablet Take 81 mg by mouth daily.    calcium carbonate (TUMS - DOSED IN MG ELEMENTAL CALCIUM) 500 MG chewable tablet Chew 1 tablet by mouth daily as needed for indigestion or heartburn.   cetirizine (ZYRTEC) 10 MG tablet Take 10 mg by mouth daily.    Cholecalciferol (VITAMIN D3) 125 MCG (5000 UT) TBDP Take 5,000 Units by mouth daily.   Continuous Blood Gluc Receiver (FREESTYLE LIBRE 2 READER) DEVI 1 Act by Does not apply route daily.   Continuous Blood Gluc Sensor (FREESTYLE LIBRE 2 SENSOR) MISC 1 Act by Does not apply route daily.   cyanocobalamin 100 MCG tablet Take 100 mcg by mouth daily.     dicyclomine (BENTYL) 10 MG capsule TAKE 1 CAPSULE BY MOUTH 3 TIMES  DAILY BEFORE MEALS   Finerenone (KERENDIA) 20 MG TABS Take 1 tablet by mouth daily.   fluticasone (FLONASE) 50 MCG/ACT nasal spray Place 1 spray into both nostrils daily.   gabapentin (NEURONTIN) 600 MG tablet Take 1,200 mg by mouth 2 (two) times daily.   Glucagon (GVOKE HYPOPEN 2-PACK) 1 MG/0.2ML SOAJ Inject 1 Act into the skin daily as needed.   HUMALOG KWIKPEN 100 UNIT/ML KwikPen INJECT SUBCUTANEOUSLY 16  UNITS 3 TIMES DAILY   Insulin Pen Needle (INSUPEN PEN NEEDLES) 33G X 4 MM MISC Inject 1 Act into the skin 4 (four) times daily - after meals and at bedtime.   ipratropium (ATROVENT HFA) 17 MCG/ACT inhaler USE 2 PUFFS EVERY 4 HOURS  AS NEEDED FOR WHEEZING.   Lancets (ONETOUCH DELICA PLUS [id number]) MISC USE AS DIRECTED TO CHECK BLOOD SUGAR 4 TIMES  DAILY AFTER MEALS AND AT BEDTIME.   lisinopril (ZESTRIL) 20 MG tablet TAKE 1 TABLET BY MOUTH  DAILY   loperamide (IMODIUM) 2 MG capsule Take by mouth as needed for diarrhea or loose stools.   metoprolol tartrate (LOPRESSOR) 25 MG tablet TAKE 1 TABLET BY MOUTH  TWICE DAILY   omeprazole (PRILOSEC) 20 MG capsule Take 20 mg by mouth daily as needed.   ONETOUCH VERIO test strip CHECK BLOOD SUGAR 4 TIMES  A DAY.   rosuvastatin (CRESTOR) 20 MG tablet TAKE 1 TABLET BY MOUTH  DAILY   Simethicone 125 MG CAPS Take 1 capsule by mouth daily as needed.   SYNTHROID 75 MCG tablet TAKE 1 TABLET DAILY BEFORE BREAKFAST FOR HYPOTHYROIDISM   TOUJEO SOLOSTAR 300 UNIT/ML Solostar Pen INJECT 50 UNITS INTO THE  SKIN DAILY   TRINTELLIX 20 MG TABS tablet Take 20 mg by mouth daily.   zolpidem (AMBIEN) 5 MG tablet Take 5 mg by mouth at bedtime.   [DISCONTINUED] azelastine (ASTELIN) 137 MCG/SPRAY nasal spray 1 spray by Nasal route 2 (two) times daily. Use in each nostril as directed    [DISCONTINUED] budesonide-formoterol (SYMBICORT) 160-4.5 MCG/ACT inhaler Inhale 2 puffs into the lungs 2 (two) times daily.     [DISCONTINUED] diphenhydrAMINE (SOMINEX) 25 MG tablet Take 25 mg by mouth at bedtime as needed.     [DISCONTINUED] temazepam (RESTORIL) 30 MG capsule Take 30 mg by mouth at bedtime as needed.   No facility-administered encounter medications on file as of 01/18/2022.    Allergies (verified) Jardiance [empagliflozin], Oxycodone hcl, Oxycodone-acetaminophen, Sulfamethoxazole, Trulicity [dulaglutide], Codeine phosphate, Erythromycin, Metformin and related, Morphine sulfate, and Tylenol [acetaminophen]   History: Past Medical History:  Diagnosis Date   Allergy    Anxiety    Arthritis    Asthma    Blood transfusion without reported diagnosis 2002   with knee replacement   Depression    Diabetes mellitus    Diabetes mellitus type 2 with retinopathy ([place])    Diverticulitis    Dyspareunia    vaginal dryness   Fibroid    reason for hysterectomy   GERD (gastroesophageal reflux disease)    Heart abnormality    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 [place]   FOOT SURGERY Bilateral    NASAL SINUS SURGERY     OOPHORECTOMY     PARTIAL COLECTOMY     -RSO   ROTATOR CUFF REPAIR Left    TOTAL KNEE ARTHROPLASTY Bilateral 2002, 2004   Family History  Problem Relation Age of Onset   COPD Mother    Diabetes Mother    Heart attack Father 6   Hypertension Father    Heart attack Sister 5   Diabetes Sister    Hypertension Sister    Diabetes Brother    Hypertension Sister    Diabetes Sister    Hyperlipidemia Other        fhx   Hypertension Other        fhx  Cancer Other        ovarian/grandmother   Ovarian cancer Maternal Aunt    Diabetes Maternal Aunt    Stomach cancer Maternal Grandmother    Liver cancer Maternal Grandmother    Social History   Socioeconomic History   Marital status: Married    Spouse name: Juanda Crumble   Number of children: 2   Years of education: 18   Highest education level: Not on file  Occupational History   Occupation: disabled    Comment: orthopedic / psych  Tobacco Use   Smoking status: Never   Smokeless tobacco: Never  Vaping Use   Vaping Use: Never used  Substance and Sexual Activity   Alcohol use: No    Alcohol/week: 0.0 standard drinks    Comment: stopped in 2018, was social    Drug use: No   Sexual activity: Not Currently    Partners: Male    Birth control/protection: Surgical    Comment: TAH  Other Topics Concern   Not on file  Social History Narrative   Grew up in [place]. Currently resides in resides in a house with her husband. 4 dogs. Fun: Play with your dogs and be with grandchildren, needle work.    Denies any religious beliefs effecting healthcare.    Social Determinants of Health   Financial Resource Strain: Low Risk    Difficulty of Paying Living Expenses: Not hard at all  Food Insecurity: No Food Insecurity   Worried About  Charity fundraiser in the Last Year: Never true   [place] in the Last Year: Never true  Transportation Needs: No Transportation Needs   Lack of Transportation (Medical): No   Lack of Transportation (Non-Medical): No  Physical Activity: Inactive   Days of Exercise per Week: 0 days   Minutes of Exercise per Session: 0 min  Stress: No Stress Concern Present   Feeling of Stress : Not at all  Social Connections: Moderately Isolated   Frequency of Communication with Friends and Family: Never   Frequency of Social Gatherings with Friends and Family: Never   Attends Religious Services: Never   Marine scientist or Organizations: Yes   Attends Archivist Meetings: Never   Marital Status: Married    Tobacco Counseling Counseling given: Not Answered   Clinical Intake:  Pre-visit preparation completed: Yes  Pain : No/denies pain     Nutritional Risks: None Diabetes: Yes CBG done?: No Did pt. bring in CBG monitor from home?: No  How often do you need to have someone help you when you read instructions, pamphlets, or other written materials from your doctor or pharmacy?: 1 - Never What is the last grade level you completed in school?: Post Graduate Degree  Diabetic? yes  Interpreter Needed?: No  Information entered by :: Lisette Abu, LPN   Activities of Daily Living In your present state of health, do you have any difficulty performing the following activities: 01/18/2022  Hearing? N  Vision? N  Difficulty concentrating or making decisions? N  Walking or climbing stairs? N  Dressing or bathing? N  Doing errands, shopping? N  Preparing Food and eating ? N  Using the Toilet? N  In the past six months, have you accidently leaked urine? Y  Comment wear protection  Do you have problems with loss of bowel control? N  Managing your Medications? N  Managing your Finances? N  Housekeeping or managing your Housekeeping? N  Some recent data might be  hidden    Patient Care Team: Janith Lima, MD as PCP - General (Internal Medicine) Nunzio Cobbs, MD as Consulting Physician (Obstetrics and Gynecology) Irene Shipper, MD as Consulting Physician (Gastroenterology) Chucky May, MD as Consulting Physician (Psychiatry) Webb Laws, [place] as Referring Physician (Optometry) Szabat, Darnelle Maffucci, [facility] as Pharmacist (Pharmacist)  Indicate any recent Medical Services you may have received from other than [facility] providers in the past year (date may be approximate).     Assessment:   This is a routine wellness examination for Meredith Hayden.  Hearing/Vision screen Hearing Screening - Comments:: Patient denied any hearing difficulty.   No hearing aids.  Vision Screening - Comments:: Patient wears corrective glasses/contacts.  Eye exam done annually by: Howard McFarland, OD.  Dietary issues and exercise activities discussed: Current Exercise Habits: The patient does not participate in regular exercise at present, Exercise limited by: psychological condition(s);orthopedic condition(s)   Goals Addressed   None   Depression Screen PHQ 2/9 Scores 01/18/2022 08/21/2021 01/02/2021 05/23/2020 02/10/2020 07/28/2019 01/07/2019  PHQ - 2 Score 0 1 0 2 2 2 2   PHQ- 9 Score - - - 9 5 7 8     Fall Risk Fall Risk  01/18/2022 08/21/2021 01/02/2021 05/23/2020 02/10/2020  Falls in the past year? 0 0 0 0 0  Number falls in past yr: 0 0 0 0 0  Injury with Fall? 0 0 0 0 0  Comment - - - - -  Risk for fall due to : No Fall Risks - No Fall Risks Impaired balance/gait Orthopedic patient  Follow up Falls evaluation completed - Falls evaluation completed Falls evaluation completed Falls evaluation completed    [place]:  Any stairs in or around the home? No  If so, are there any without handrails? No  Home free of loose throw rugs in walkways, pet beds, electrical cords, etc? Yes  Adequate lighting in your home to reduce risk of  falls? Yes   ASSISTIVE DEVICES UTILIZED TO PREVENT FALLS:  Life alert? No  Use of a cane, walker or w/c? No  Grab bars in the bathroom? Yes  Shower chair or bench in shower? No  Elevated toilet seat or a handicapped toilet? No   TIMED UP AND GO:  Was the test performed? No .  Length of time to ambulate 10 feet: n/a sec.   Gait steady and fast without use of assistive device  Cognitive Function: Normal cognitive status assessed by direct observation by this Nurse Health Advisor. No abnormalities found.   MMSE - Mini Mental State Exam 01/01/2018  Not completed: Refused        Immunizations Immunization History  Administered Date(s) Administered   Fluad Quad(high Dose 65+) 10/21/2019, 10/13/2020, 12/13/2021   Hepatitis A, Adult 03/01/2015   Influenza Split 09/19/2011   Influenza Whole 12/08/2007, 10/07/2009, 10/13/2010   Influenza, High Dose Seasonal PF 10/12/2016, 09/05/2017, 11/25/2018   Influenza,inj,Quad PF,6+ Mos 12/09/2013, 09/30/2015   Influenza-Unspecified 11/26/2014   Pneumococcal Conjugate-13 01/01/2018   Pneumococcal Polysaccharide-23 09/19/2011, 01/07/2019   Td 09/19/2007   Tdap 05/19/2018   Zoster, Live 07/07/2013    TDAP status: Up to date  Flu Vaccine status: Up to date  Pneumococcal vaccine status: Up to date  Covid-19 vaccine status: Declined, Education has been provided regarding the importance of this vaccine but patient still declined. Advised may receive this vaccine at local pharmacy or Health Dept.or vaccine clinic. Aware to provide a copy of the vaccination  record if obtained from local pharmacy or Health Dept. Verbalized acceptance and understanding.  Qualifies for Shingles Vaccine? Yes   Zostavax completed Yes   Shingrix Completed?: No.    Education has been provided regarding the importance of this vaccine. Patient has been advised to call insurance company to determine out of pocket expense if they have not yet received this vaccine. Advised  may also receive vaccine at local pharmacy or Health Dept. Verbalized acceptance and understanding.  Screening Tests Health Maintenance  Topic Date Due   COVID-19 Vaccine (1) Never done   Zoster Vaccines- Shingrix (1 of 2) Never done   MAMMOGRAM  06/04/2020   OPHTHALMOLOGY EXAM  04/20/2022   HEMOGLOBIN A1C  06/13/2022   FOOT EXAM  08/21/2022   COLONOSCOPY (Pts 45-71yrs Insurance coverage will need to be confirmed)  11/14/2026   TETANUS/TDAP  05/19/2028   Pneumonia Vaccine 41+ Years old  Completed   INFLUENZA VACCINE  Completed   DEXA SCAN  Completed   Hepatitis C Screening  Completed   HPV VACCINES  Aged Out    Health Maintenance  Health Maintenance Due  Topic Date Due   COVID-19 Vaccine (1) Never done   Zoster Vaccines- Shingrix (1 of 2) Never done   MAMMOGRAM  06/04/2020    Colorectal cancer screening: Type of screening: Colonoscopy. Completed 11/14/2016. Repeat every 10 years  Mammogram status: No longer required due to patient refusal.  Bone Density status: Completed 03/09/2015. Results reflect: Bone density results: OSTEOPENIA. Repeat every 2-3 years. (Patient refused)  Lung Cancer Screening: (Low Dose CT Chest recommended if Age 61-80 years, 30 pack-year currently smoking OR have quit w/in 15years.) does not qualify.   Lung Cancer Screening Referral: no  Additional Screening:  Hepatitis C Screening: does qualify; Completed yes  Vision Screening: Recommended annual ophthalmology exams for early detection of glaucoma and other disorders of the eye. Is the patient up to date with their annual eye exam?  No  Who is the provider or what is the name of the office in which the patient attends annual eye exams? Howard McFarland, OD If pt is not established with a provider, would they like to be referred to a provider to establish care? No . (Patient refused)  Dental Screening: Recommended annual dental exams for proper oral hygiene  Community Resource Referral / Chronic  Care Management: CRR required this visit?  No   CCM required this visit?  No      Plan:     I have personally reviewed and noted the following in the patients chart:   Medical and social history Use of alcohol, tobacco or illicit drugs  Current medications and supplements including opioid prescriptions.  Functional ability and status Nutritional status Physical activity Advanced directives List of other physicians Hospitalizations, surgeries, and ER visits in previous 12 months Vitals Screenings to include cognitive, depression, and falls Referrals and appointments  In addition, I have reviewed and discussed with patient certain preventive protocols, quality metrics, and best practice recommendations. A written personalized care plan for preventive services as well as general preventive health recommendations were provided to patient.     Sheral Flow, LPN   6/96/2952   Nurse Notes:  Patient refused any referrals. Patient is cogitatively intact. There were no vitals filed for this visit. There is no height or weight on file to calculate BMI.

## 2022-01-22 DIAGNOSIS — E1122 Type 2 diabetes mellitus with diabetic chronic kidney disease: Secondary | ICD-10-CM | POA: Diagnosis not present

## 2022-01-30 ENCOUNTER — Other Ambulatory Visit: Payer: Self-pay | Admitting: Internal Medicine

## 2022-01-30 DIAGNOSIS — I10 Essential (primary) hypertension: Secondary | ICD-10-CM

## 2022-01-30 DIAGNOSIS — E11319 Type 2 diabetes mellitus with unspecified diabetic retinopathy without macular edema: Secondary | ICD-10-CM

## 2022-01-30 DIAGNOSIS — E119 Type 2 diabetes mellitus without complications: Secondary | ICD-10-CM

## 2022-01-30 DIAGNOSIS — N1831 Chronic kidney disease, stage 3a: Secondary | ICD-10-CM

## 2022-01-30 DIAGNOSIS — Z794 Long term (current) use of insulin: Secondary | ICD-10-CM

## 2022-01-30 DIAGNOSIS — E785 Hyperlipidemia, unspecified: Secondary | ICD-10-CM

## 2022-01-31 ENCOUNTER — Encounter: Payer: Medicare Other | Admitting: Obstetrics and Gynecology

## 2022-01-31 DIAGNOSIS — Z0289 Encounter for other administrative examinations: Secondary | ICD-10-CM

## 2022-01-31 NOTE — Progress Notes (Deleted)
72 y.o. G73P2002 Married Caucasian female here for annual exam.    PCP:     Patient's last menstrual period was 12/31/1993.           Sexually active: {yes no:314532}  The current method of family planning is status post hysterectomy.    Exercising: {yes no:314532}  {types:19826} Smoker:  no  Health Maintenance: Pap:  2009 normal History of abnormal Pap:  yes, years ago MMG:  ***03-09-15 category b density birads 1:neg Colonoscopy:  2017 normal;10 years BMD:  ***2016  Result :Osteopenia TDaP:  2019 Gardasil:   no HIV:*** Hep C: 2017 Neg Screening Labs:  Hb today: ***, Urine today: ***   reports that she has never smoked. She has never used smokeless tobacco. She reports that she does not drink alcohol and does not use drugs.  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 Ubaldo Glassing   CESAREAN SECTION  1978   FOOT SURGERY Bilateral    NASAL SINUS SURGERY     OOPHORECTOMY     PARTIAL COLECTOMY     -RSO   ROTATOR CUFF REPAIR Left    TOTAL KNEE ARTHROPLASTY Bilateral 2002, 2004    Current Outpatient Medications  Medication Sig Dispense Refill   aspirin 81 MG tablet Take 81 mg by mouth daily.       calcium carbonate (TUMS - DOSED IN MG ELEMENTAL CALCIUM) 500 MG chewable tablet Chew 1 tablet by mouth daily as needed for indigestion or heartburn.     cetirizine (ZYRTEC) 10 MG tablet Take 10 mg by mouth daily.      Cholecalciferol (VITAMIN D3) 125 MCG (5000 UT) TBDP Take 5,000 Units by mouth daily.     Continuous Blood Gluc Receiver (FREESTYLE LIBRE 2 READER) DEVI 1 Act by Does not apply route daily. 2 each 5   Continuous Blood Gluc Sensor (FREESTYLE LIBRE 2 SENSOR) MISC 1 Act by Does not apply route daily. 2 each 5   cyanocobalamin 100 MCG tablet Take 100 mcg by mouth daily.       dicyclomine (BENTYL) 10 MG capsule TAKE 1 CAPSULE BY MOUTH 3 TIMES  DAILY BEFORE MEALS 270 capsule 0   fluticasone (FLONASE) 50 MCG/ACT nasal spray Place 1 spray into both nostrils daily.     gabapentin (NEURONTIN) 600 MG tablet Take 1,200 mg by mouth 2 (two) times daily.     Glucagon (GVOKE HYPOPEN 2-PACK) 1 MG/0.2ML SOAJ Inject 1 Act into the skin daily as needed. 2 mL 5  HUMALOG KWIKPEN 100 UNIT/ML KwikPen INJECT SUBCUTANEOUSLY 16  UNITS 3 TIMES DAILY 45 mL 3   Insulin Pen Needle (INSUPEN PEN NEEDLES) 33G X 4 MM MISC Inject 1 Act into the skin 4 (four) times daily - after meals and at bedtime. 300 each 5   ipratropium (ATROVENT HFA) 17 MCG/ACT inhaler USE 2 PUFFS EVERY 4 HOURS  AS NEEDED FOR WHEEZING. 12.9 g 3   KERENDIA 20 MG TABS TAKE 1 TABLET BY MOUTH  DAILY 90 tablet 1   Lancets (ONETOUCH DELICA PLUS [id number]) MISC USE AS DIRECTED TO CHECK BLOOD SUGAR 4 TIMES DAILY AFTER MEALS AND AT BEDTIME. 200 each 4   lisinopril (ZESTRIL) 20 MG tablet TAKE 1 TABLET BY MOUTH  DAILY 90 tablet 1   loperamide (IMODIUM) 2 MG capsule Take by mouth as needed for diarrhea or loose stools.     metoprolol tartrate (LOPRESSOR) 25 MG tablet TAKE 1 TABLET BY MOUTH TWICE  DAILY 180 tablet 1   omeprazole (PRILOSEC) 20 MG capsule Take 20 mg by mouth daily as needed.     ONETOUCH VERIO test strip CHECK BLOOD SUGAR 4 TIMES A DAY. 200  strip 5   rosuvastatin (CRESTOR) 20 MG tablet TAKE 1 TABLET BY MOUTH  DAILY 90 tablet 1   Simethicone 125 MG CAPS Take 1 capsule by mouth daily as needed.     SYNTHROID 75 MCG tablet TAKE 1 TABLET DAILY BEFORE BREAKFAST FOR HYPOTHYROIDISM 90 tablet 1   TOUJEO SOLOSTAR 300 UNIT/ML Solostar Pen INJECT 50 UNITS INTO THE  SKIN DAILY 18 mL 3   TRINTELLIX 20 MG TABS tablet Take 20 mg by mouth daily.     zolpidem (AMBIEN) 5 MG tablet Take 5 mg by mouth at bedtime.     No current facility-administered medications for this visit.    Family History  Problem Relation Age of Onset   COPD Mother    Diabetes Mother    Heart attack Father 94   Hypertension Father    Heart attack Sister 67   Diabetes Sister    Hypertension Sister    Diabetes Brother    Hypertension Sister    Diabetes Sister    Hyperlipidemia Other        fhx   Hypertension Other        fhx   Cancer Other        ovarian/grandmother   Ovarian cancer Maternal Aunt    Diabetes Maternal Aunt    Stomach cancer Maternal Grandmother    Liver cancer Maternal Grandmother     Review of Systems  Exam:   LMP 12/31/1993     General appearance: alert, cooperative and appears stated age Head: normocephalic, without obvious abnormality, atraumatic Neck: no adenopathy, supple, symmetrical, trachea midline and thyroid normal to inspection and palpation Lungs: clear to auscultation bilaterally Breasts: normal appearance, no masses or tenderness, No nipple retraction or dimpling, No nipple discharge or bleeding, No axillary adenopathy Heart: regular rate and rhythm Abdomen: soft, non-tender; no masses, no organomegaly Extremities: extremities normal, atraumatic, no cyanosis or edema Skin: skin color, texture, turgor normal. No rashes or lesions Lymph nodes: cervical, supraclavicular, and axillary nodes normal. Neurologic: grossly normal  Pelvic: External genitalia:  no lesions              No abnormal inguinal nodes palpated.               Urethra:  normal appearing urethra with no masses, tenderness or lesions  Bartholins and Skenes: normal                 Vagina: normal appearing vagina with normal color and discharge, no lesions              Cervix: no lesions              Pap taken: {yes no:314532} Bimanual Exam:  Uterus:  normal size, contour, position, consistency, mobility, non-tender              Adnexa: no mass, fullness, tenderness              Rectal exam: {yes no:314532}.  Confirms.              Anus:  normal sphincter tone, no lesions  Chaperone was present for exam:  ***  Assessment:   Well woman visit with gynecologic exam.   Plan: Mammogram screening discussed. Self breast awareness reviewed. Pap and HR HPV as above. Guidelines for Calcium, Vitamin D, regular exercise program including cardiovascular and weight bearing exercise.   Follow up annually and prn.   Additional counseling given.  {yes [id number]. _______ minutes face to face time of which over 50% was spent in counseling.    After visit summary provided.

## 2022-02-22 DIAGNOSIS — E1122 Type 2 diabetes mellitus with diabetic chronic kidney disease: Secondary | ICD-10-CM | POA: Diagnosis not present

## 2022-03-19 DIAGNOSIS — E1122 Type 2 diabetes mellitus with diabetic chronic kidney disease: Secondary | ICD-10-CM | POA: Diagnosis not present

## 2022-03-29 ENCOUNTER — Encounter: Payer: Self-pay | Admitting: Internal Medicine

## 2022-04-05 ENCOUNTER — Ambulatory Visit (INDEPENDENT_AMBULATORY_CARE_PROVIDER_SITE_OTHER): Payer: Medicare Other | Admitting: Internal Medicine

## 2022-04-05 ENCOUNTER — Encounter: Payer: Self-pay | Admitting: Internal Medicine

## 2022-04-05 VITALS — BP 132/80 | HR 66 | Temp 98.9°F | Ht 61.0 in | Wt 231.0 lb

## 2022-04-05 DIAGNOSIS — Z794 Long term (current) use of insulin: Secondary | ICD-10-CM

## 2022-04-05 DIAGNOSIS — K581 Irritable bowel syndrome with constipation: Secondary | ICD-10-CM | POA: Diagnosis not present

## 2022-04-05 DIAGNOSIS — N1831 Chronic kidney disease, stage 3a: Secondary | ICD-10-CM

## 2022-04-05 DIAGNOSIS — Z23 Encounter for immunization: Secondary | ICD-10-CM | POA: Insufficient documentation

## 2022-04-05 DIAGNOSIS — E119 Type 2 diabetes mellitus without complications: Secondary | ICD-10-CM

## 2022-04-05 DIAGNOSIS — H9313 Tinnitus, bilateral: Secondary | ICD-10-CM | POA: Diagnosis not present

## 2022-04-05 DIAGNOSIS — I10 Essential (primary) hypertension: Secondary | ICD-10-CM | POA: Diagnosis not present

## 2022-04-05 LAB — BASIC METABOLIC PANEL
BUN: 28 mg/dL — ABNORMAL HIGH (ref 6–23)
CO2: 27 mEq/L (ref 19–32)
Calcium: 9.2 mg/dL (ref 8.4–10.5)
Chloride: 105 mEq/L (ref 96–112)
Creatinine, Ser: 1.51 mg/dL — ABNORMAL HIGH (ref 0.40–1.20)
GFR: 35.05 mL/min — ABNORMAL LOW (ref >60.00)
Glucose, Bld: 122 mg/dL — ABNORMAL HIGH (ref 70–99)
Potassium: 4.7 mEq/L (ref 3.5–5.1)
Sodium: 138 mEq/L (ref 135–145)

## 2022-04-05 LAB — HEMOGLOBIN A1C: Hgb A1c MFr Bld: 7.6 % — ABNORMAL HIGH (ref 4.6–6.5)

## 2022-04-05 MED ORDER — TRULANCE 3 MG PO TABS: 1.0000 | ORAL_TABLET | Freq: Every day | ORAL | 1 refills | Status: DC

## 2022-04-05 MED ORDER — SHINGRIX 50 MCG/0.5ML IM SUSR: 0.5000 mL | Freq: Once | INTRAMUSCULAR | 1 refills | Status: AC

## 2022-04-05 NOTE — Progress Notes (Signed)
? ?Subjective:  ?Patient ID: Meredith Hayden, female    DOB: November 07, 1950  Age: 72 y.o. MRN: [id number] ? ?CC: Abdominal Pain, Hypertension, and Diabetes ? ? ?HPI ?Meredith Hayden presents for f/up - ? ?She complains of a 70-monthhistory of aching in her left lower abdomen with constipation.  She denies nausea, vomiting, loss of appetite, weight loss, dysuria, or hematuria. ? ?Outpatient Medications Prior to Visit  ?Medication Sig Dispense Refill  ? Continuous Blood Gluc Receiver (FREESTYLE LIBRE 2 READER) DEVI 1 Act by Does not apply route daily. 2 each 5  ? Continuous Blood Gluc Sensor (FREESTYLE LIBRE 2 SENSOR) MISC 1 Act by Does not apply route daily. 2 each 5  ? dicyclomine (BENTYL) 10 MG capsule TAKE 1 CAPSULE BY MOUTH 3 TIMES  DAILY BEFORE MEALS 270 capsule 0  ? fluticasone (FLONASE) 50 MCG/ACT nasal spray Place 1 spray into both nostrils daily.    ? gabapentin (NEURONTIN) 600 MG tablet Take 1,200 mg by mouth 2 (two) times daily.    ? Glucagon (GVOKE HYPOPEN 2-PACK) 1 MG/0.2ML SOAJ Inject 1 Act into the skin daily as needed. 2 mL 5  ? HUMALOG KWIKPEN 100 UNIT/ML KwikPen INJECT SUBCUTANEOUSLY 16  UNITS 3 TIMES DAILY 45 mL 3  ? Insulin Pen Needle (INSUPEN PEN NEEDLES) 33G X 4 MM MISC Inject 1 Act into the skin 4 (four) times daily - after meals and at bedtime. 300 each 5  ? ipratropium (ATROVENT HFA) 17 MCG/ACT inhaler USE 2 PUFFS EVERY 4 HOURS  AS NEEDED FOR WHEEZING. 12.9 g 3  ? KERENDIA 20 MG TABS TAKE 1 TABLET BY MOUTH  DAILY 90 tablet 1  ? Lancets (ONETOUCH DELICA PLUS [id number] MISC USE AS DIRECTED TO CHECK BLOOD SUGAR 4 TIMES DAILY AFTER MEALS AND AT BEDTIME. 200 each 4  ? lisinopril (ZESTRIL) 20 MG tablet TAKE 1 TABLET BY MOUTH  DAILY 90 tablet 1  ? loperamide (IMODIUM) 2 MG capsule Take by mouth as needed for diarrhea or loose stools.    ? metoprolol tartrate (LOPRESSOR) 25 MG tablet TAKE 1 TABLET BY MOUTH TWICE  DAILY 180 tablet 1  ? omeprazole (PRILOSEC) 20 MG capsule Take 20 mg by mouth  daily as needed.    ? ONETOUCH VERIO test strip CHECK BLOOD SUGAR 4 TIMES A DAY. 200 strip 5  ? rosuvastatin (CRESTOR) 20 MG tablet TAKE 1 TABLET BY MOUTH  DAILY 90 tablet 1  ? Simethicone 125 MG CAPS Take 1 capsule by mouth daily as needed.    ? SYNTHROID 75 MCG tablet TAKE 1 TABLET DAILY BEFORE BREAKFAST FOR HYPOTHYROIDISM 90 tablet 1  ? TOUJEO SOLOSTAR 300 UNIT/ML Solostar Pen INJECT 50 UNITS INTO THE  SKIN DAILY 18 mL 3  ? TRINTELLIX 20 MG TABS tablet Take 20 mg by mouth daily.    ? zolpidem (AMBIEN) 5 MG tablet Take 5 mg by mouth at bedtime.    ? aspirin 81 MG tablet Take 81 mg by mouth daily.     ? calcium carbonate (TUMS - DOSED IN MG ELEMENTAL CALCIUM) 500 MG chewable tablet Chew 1 tablet by mouth daily as needed for indigestion or heartburn.    ? cetirizine (ZYRTEC) 10 MG tablet Take 10 mg by mouth daily.     ? Cholecalciferol (VITAMIN D3) 125 MCG (5000 UT) TBDP Take 5,000 Units by mouth daily. (Patient not taking: Reported on 04/05/2022)    ? cyanocobalamin 100 MCG tablet Take 100 mcg by mouth daily.   (Patient  not taking: Reported on 04/05/2022)    ? ?No facility-administered medications prior to visit.  ? ? ?ROS ?Review of Systems  ?Constitutional:  Positive for fatigue and unexpected weight change (wt gain). Negative for chills, diaphoresis and fever.  ?HENT:  Positive for hearing loss and tinnitus. Negative for ear pain.   ?Eyes: Negative.   ?Respiratory:  Negative for cough, chest tightness, shortness of breath and wheezing.   ?Cardiovascular:  Negative for chest pain, palpitations and leg swelling.  ?Gastrointestinal:  Positive for abdominal pain and constipation. Negative for blood in stool, diarrhea, nausea and vomiting.  ?Endocrine: Negative.   ?Genitourinary:  Negative for difficulty urinating, dysuria and hematuria.  ?Musculoskeletal: Negative.   ?Skin: Negative.   ?Neurological: Negative.  Negative for dizziness and weakness.  ?Hematological:  Negative for adenopathy. Does not bruise/bleed easily.   ?Psychiatric/Behavioral: Negative.    ? ?Objective:  ?BP 132/80 (BP Location: Right Arm, Patient Position: Sitting, Cuff Size: Large)   Pulse 66   Temp 98.9 ?F (37.2 ?C) (Oral)   Ht '5\' 1"'$  (1.549 m)   Wt 231 lb (104.8 kg)   LMP 12/31/1993   SpO2 96%   BMI 43.65 kg/m?  ? ?BP Readings from Last 3 Encounters:  ?04/05/22 132/80  ?12/13/21 126/78  ?11/29/21 125/79  ? ? ?Wt Readings from Last 3 Encounters:  ?04/05/22 231 lb (104.8 kg)  ?12/13/21 223 lb (101.2 kg)  ?11/29/21 237 lb (107.5 kg)  ? ? ?Physical Exam ?Vitals reviewed.  ?Constitutional:   ?   Appearance: She is obese. She is not ill-appearing.  ?HENT:  ?   Nose: Nose normal.  ?   Mouth/Throat:  ?   Mouth: Mucous membranes are moist.  ?Eyes:  ?   General: No scleral icterus. ?   Conjunctiva/sclera: Conjunctivae normal.  ?Cardiovascular:  ?   Rate and Rhythm: Normal rate and regular rhythm.  ?   Heart sounds: No murmur heard. ?Pulmonary:  ?   Effort: Pulmonary effort is normal.  ?   Breath sounds: No stridor. No wheezing, rhonchi or rales.  ?Abdominal:  ?   General: Abdomen is protuberant. Bowel sounds are normal. There is no distension.  ?   Palpations: Abdomen is soft. There is no hepatomegaly, splenomegaly or mass.  ?   Tenderness: There is no abdominal tenderness.  ?Musculoskeletal:     ?   General: Normal range of motion.  ?   Cervical back: Neck supple.  ?   Right lower leg: No edema.  ?   Left lower leg: No edema.  ?Lymphadenopathy:  ?   Cervical: No cervical adenopathy.  ?Skin: ?   General: Skin is warm and dry.  ?Neurological:  ?   General: No focal deficit present.  ?   Mental Status: She is alert.  ?Psychiatric:     ?   Mood and Affect: Mood normal.     ?   Behavior: Behavior normal.  ? ? ?Lab Results  ?Component Value Date  ? WBC 9.9 12/13/2021  ? HGB 13.3 12/13/2021  ? HCT 40.1 12/13/2021  ? PLT 226.0 12/13/2021  ? GLUCOSE 122 (H) 04/05/2022  ? CHOL 184 08/21/2021  ? TRIG 266.0 (H) 08/21/2021  ? HDL 40.50 08/21/2021  ? LDLDIRECT 122.0  08/21/2021  ? Murphy 60 02/10/2020  ? ALT 16 11/29/2021  ? AST 25 11/29/2021  ? NA 138 04/05/2022  ? K 4.7 04/05/2022  ? CL 105 04/05/2022  ? CREATININE 1.51 (H) 04/05/2022  ? BUN 28 (H)  04/05/2022  ? CO2 27 04/05/2022  ? TSH 0.50 12/13/2021  ? INR 1.1 11/29/2021  ? HGBA1C 7.6 (H) 04/05/2022  ? MICROALBUR 0.9 08/21/2021  ? ? ?CT ANGIO GI BLEED ? ?Result Date: 11/29/2021 ?CLINICAL DATA:  Bright red blood in stool, emesis EXAM: CTA ABDOMEN AND PELVIS WITHOUT AND WITH CONTRAST TECHNIQUE: Multidetector CT imaging of the abdomen and pelvis was performed using the standard protocol during bolus administration of intravenous contrast. Multiplanar reconstructed images and MIPs were obtained and reviewed to evaluate the vascular anatomy. CONTRAST:  135m OMNIPAQUE IOHEXOL 350 MG/ML SOLN COMPARISON:  CT abdomen/pelvis 05/25/2020 FINDINGS: VASCULAR Aorta: Normal caliber aorta without aneurysm, dissection, vasculitis or significant stenosis. There is mild scattered calcified atherosclerotic plaque. Celiac: Patent without evidence of aneurysm, dissection, vasculitis or significant stenosis. SMA: Patent without evidence of aneurysm, dissection, vasculitis or significant stenosis. Renals: There is mild calcified atherosclerotic plaque at the origins of the renal arteries without significant stenosis. There is no evidence of aneurysm, dissection, vasculitis, or fibromuscular dysplasia. IMA: Patent without evidence of aneurysm, dissection, vasculitis or significant stenosis. Inflow: Patent without evidence of aneurysm, dissection, vasculitis or significant stenosis. Proximal Outflow: Bilateral common femoral and visualized portions of the superficial and profunda femoral arteries are patent without evidence of aneurysm, dissection, vasculitis or significant stenosis. Veins: The main portal and splenic veins are patent on the venous phase images. The IVC is patent. There is no evidence of active GI bleed. Review of the MIP images  confirms the above findings. NON-VASCULAR Lower chest: The lung bases are clear. The imaged heart is unremarkable. Hepatobiliary: The liver and gallbladder are unremarkable. There is no biliary ductal dilatation. Pancr

## 2022-04-05 NOTE — Patient Instructions (Signed)
Irritable Bowel Syndrome, Adult ?Irritable bowel syndrome (IBS) is a group of symptoms that affects the organs responsible for digestion (gastrointestinal or GI tract). IBS is not one specific disease. ?To regulate how the GI tract works, the body sends signals back and forth between the intestines and the brain. If you have IBS, there may be a problem with these signals. As a result, the GI tract does not function normally. The intestines may become more sensitive and overreact to certain things. This may be especially true when you eat certain foods or when you are under stress. ?There are four types of IBS. These may be determined based on the consistency of your stool (feces): ?IBS with diarrhea. ?IBS with constipation. ?Mixed IBS. ?Unsubtyped IBS. ?It is important to know which type of IBS you have. Certain treatments are more likely to be helpful for certain types of IBS. ?What are the causes? ?The exact cause of IBS is not known. ?What increases the risk? ?You may have a higher risk for IBS if you: ?Are female. ?Are younger than 30. ?Have a family history of IBS. ?Have a mental health condition, such as depression, anxiety, or post-traumatic stress disorder. ?Have had a bacterial infection of your GI tract. ?What are the signs or symptoms? ?Symptoms of IBS vary from person to person. The main symptom is abdominal pain or discomfort. Other symptoms usually include one or more of the following: ?Diarrhea, constipation, or both. ?Abdominal swelling or bloating. ?Feeling full after eating a small or regular-sized meal. ?Frequent gas. ?Mucus in the stool. ?A feeling of having more stool left after a bowel movement. ?Symptoms tend to come and go. They may be triggered by stress, mental health conditions, or certain foods. ?How is this diagnosed? ?This condition may be diagnosed based on a physical exam, your medical history, and your symptoms. You may have tests, such as: ?Blood tests. ?Stool test. ?X-rays. ?CT  scan. ?Colonoscopy. This is a procedure in which your GI tract is viewed with a long, thin, flexible tube. ?How is this treated? ?There is no cure for IBS, but treatment can help relieve symptoms. Treatment depends on the type of IBS you have, and may include: ?Changes to your diet, such as: ?Avoiding foods that cause symptoms. ?Drinking more water. ?Following a low-FODMAP (fermentable oligosaccharides, disaccharides, monosaccharides, and polyols) diet for up to 6 weeks, or as told by your health care provider. FODMAPs are sugars that are hard for some people to digest. ?Eating more fiber. ?Eating medium-sized meals at the same times every day. ?Medicines. These may include: ?Fiber supplements, if you have constipation. ?Medicine to control diarrhea (antidiarrheal medicines). ?Medicine to help control muscle tightening (spasms) in your GI tract (antispasmodic medicines). ?Medicines to help with mental health conditions, such as antidepressants or tranquilizers. ?Talk therapy or counseling. ?Working with a diet and nutrition specialist (dietitian) to help create a food plan that is right for you. ?Managing your stress. ?Follow these instructions at home: ?Eating and drinking ?Eat a healthy diet. ?Eat medium-sized meals at about the same time every day. Do not eat large meals. ?Gradually eat more fiber-rich foods. These include whole grains, fruits, and vegetables. This may be especially helpful if you have IBS with constipation. ?Eat a diet low in FODMAPs. ?Drink enough fluid to keep your urine pale yellow. ?Keep a journal of foods that seem to trigger symptoms. ?Avoid foods and drinks that: ?Contain added sugar. ?Make your symptoms worse. Dairy products, caffeinated drinks, and carbonated drinks can make symptoms worse  for some people. ?General instructions ?Take over-the-counter and prescription medicines and supplements only as told by your health care provider. ?Get enough exercise. Do at least 150 minutes of  moderate-intensity exercise each week. ?Manage your stress. Getting enough sleep and exercise can help you manage stress. ?Keep all follow-up visits as told by your health care provider and therapist. This is important. ?Alcohol Use ?Do not drink alcohol if: ?Your health care provider tells you not to drink. ?You are pregnant, may be pregnant, or are planning to become pregnant. ?If you drink alcohol, limit how much you have: ?0-1 drink a day for women. ?0-2 drinks a day for men. ?Be aware of how much alcohol is in your drink. In the [place] one drink equals one typical bottle of beer (12 oz), one-half glass of wine (5 oz), or one shot of hard liquor (1? oz). ?Contact a health care provider if you have: ?Constant pain. ?Weight loss. ?Difficulty or pain when swallowing. ?Diarrhea that gets worse. ?Get help right away if you have: ?Severe abdominal pain. ?Fever. ?Diarrhea with symptoms of dehydration, such as dizziness or dry mouth. ?Bright red blood in your stool. ?Stool that is black and tarry. ?Abdominal swelling. ?Vomiting that does not stop. ?Blood in your vomit. ?Summary ?Irritable bowel syndrome (IBS) is not one specific disease. It is a group of symptoms that affects digestion. ?Your intestines may become more sensitive and overreact to certain things. This may be especially true when you eat certain foods or when you are under stress. ?There is no cure for IBS, but treatment can help relieve symptoms. ?This information is not intended to replace advice given to you by your health care provider. Make sure you discuss any questions you have with your health care provider. ?Document Revised: 08/11/2020 Document Reviewed: 08/18/2020 ?Elsevier Patient Education ? [employer]. ? ?

## 2022-04-07 ENCOUNTER — Encounter: Payer: Self-pay | Admitting: Internal Medicine

## 2022-04-18 ENCOUNTER — Ambulatory Visit: Payer: Medicare Other | Admitting: Audiologist

## 2022-04-18 DIAGNOSIS — E1122 Type 2 diabetes mellitus with diabetic chronic kidney disease: Secondary | ICD-10-CM | POA: Diagnosis not present

## 2022-04-25 ENCOUNTER — Ambulatory Visit: Payer: Medicare Other | Attending: Internal Medicine | Admitting: Audiologist

## 2022-05-05 ENCOUNTER — Other Ambulatory Visit: Payer: Self-pay | Admitting: Internal Medicine

## 2022-05-05 DIAGNOSIS — E039 Hypothyroidism, unspecified: Secondary | ICD-10-CM

## 2022-05-07 ENCOUNTER — Other Ambulatory Visit: Payer: Self-pay | Admitting: Internal Medicine

## 2022-05-07 DIAGNOSIS — E785 Hyperlipidemia, unspecified: Secondary | ICD-10-CM

## 2022-05-07 DIAGNOSIS — E11319 Type 2 diabetes mellitus with unspecified diabetic retinopathy without macular edema: Secondary | ICD-10-CM

## 2022-05-08 ENCOUNTER — Other Ambulatory Visit: Payer: Self-pay | Admitting: Internal Medicine

## 2022-05-08 DIAGNOSIS — E11319 Type 2 diabetes mellitus with unspecified diabetic retinopathy without macular edema: Secondary | ICD-10-CM

## 2022-05-08 DIAGNOSIS — I10 Essential (primary) hypertension: Secondary | ICD-10-CM

## 2022-05-15 LAB — HM DIABETES EYE EXAM

## 2022-05-18 DIAGNOSIS — E1122 Type 2 diabetes mellitus with diabetic chronic kidney disease: Secondary | ICD-10-CM | POA: Diagnosis not present

## 2022-05-23 DIAGNOSIS — L900 Lichen sclerosus et atrophicus: Secondary | ICD-10-CM | POA: Diagnosis not present

## 2022-05-23 DIAGNOSIS — B372 Candidiasis of skin and nail: Secondary | ICD-10-CM | POA: Diagnosis not present

## 2022-05-25 DIAGNOSIS — L249 Irritant contact dermatitis, unspecified cause: Secondary | ICD-10-CM | POA: Diagnosis not present

## 2022-05-25 DIAGNOSIS — L900 Lichen sclerosus et atrophicus: Secondary | ICD-10-CM | POA: Diagnosis not present

## 2022-05-25 DIAGNOSIS — L304 Erythema intertrigo: Secondary | ICD-10-CM | POA: Diagnosis not present

## 2022-05-28 ENCOUNTER — Other Ambulatory Visit: Payer: Self-pay | Admitting: Internal Medicine

## 2022-05-28 DIAGNOSIS — E11319 Type 2 diabetes mellitus with unspecified diabetic retinopathy without macular edema: Secondary | ICD-10-CM

## 2022-05-28 DIAGNOSIS — N1831 Chronic kidney disease, stage 3a: Secondary | ICD-10-CM

## 2022-05-28 DIAGNOSIS — E119 Type 2 diabetes mellitus without complications: Secondary | ICD-10-CM

## 2022-06-12 DIAGNOSIS — E1122 Type 2 diabetes mellitus with diabetic chronic kidney disease: Secondary | ICD-10-CM | POA: Diagnosis not present

## 2022-06-21 ENCOUNTER — Ambulatory Visit: Payer: Medicare Other | Admitting: Internal Medicine

## 2022-06-25 ENCOUNTER — Other Ambulatory Visit: Payer: Self-pay | Admitting: Internal Medicine

## 2022-07-07 ENCOUNTER — Other Ambulatory Visit: Payer: Self-pay | Admitting: Internal Medicine

## 2022-07-07 DIAGNOSIS — I10 Essential (primary) hypertension: Secondary | ICD-10-CM

## 2022-07-08 IMAGING — CT CTA GI BLEED
3 of 10 series · 11 of 46 positions shown, 17 images · IV contrast (Omni 300)
Comparison: CT abdomen/pelvis 05/25/2020

CLINICAL DATA: Bright red blood in stool, emesis

EXAM:
CTA ABDOMEN AND PELVIS WITHOUT AND WITH CONTRAST
TECHNIQUE: Multidetector CT imaging of the abdomen and pelvis was performed
using the standard protocol during bolus administration of
intravenous contrast. Multiplanar reconstructed images and MIPs were
obtained and reviewed to evaluate the vascular anatomy.
CONTRAST:  100mL OMNIPAQUE IOHEXOL 350 MG/ML SOLN

[Series 6: arterial 3.0 · axial · arterial · 0.98mm/px · z∈[+968,+1085]mm · 3 of 155 slices shown]
[im 13/155  soft-tissue]
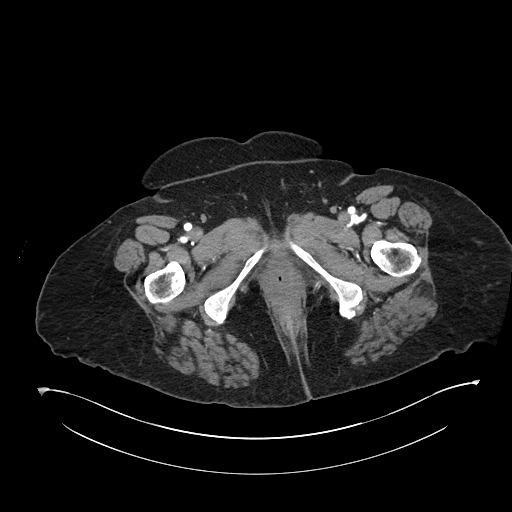
[im 39/155  soft-tissue]
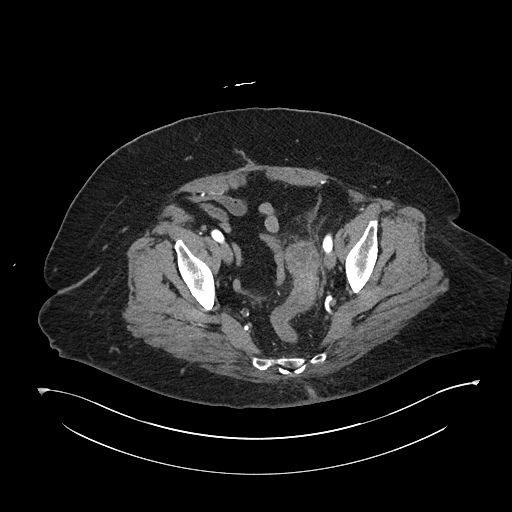
[im 52/155  soft-tissue]
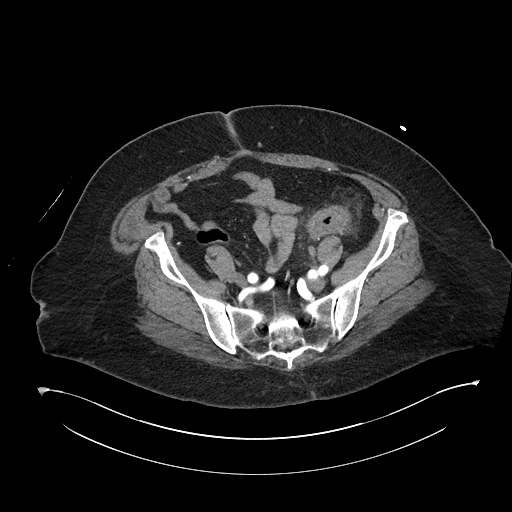

[Series 8: arterial cor · coronal · arterial · 0.92mm/px · 2 of 156 slices shown, 3 images]
[im 52/156  soft-tissue]
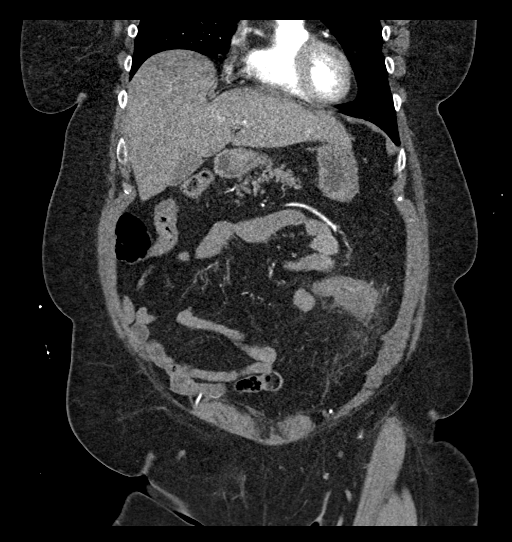
[im 52/156  bone]
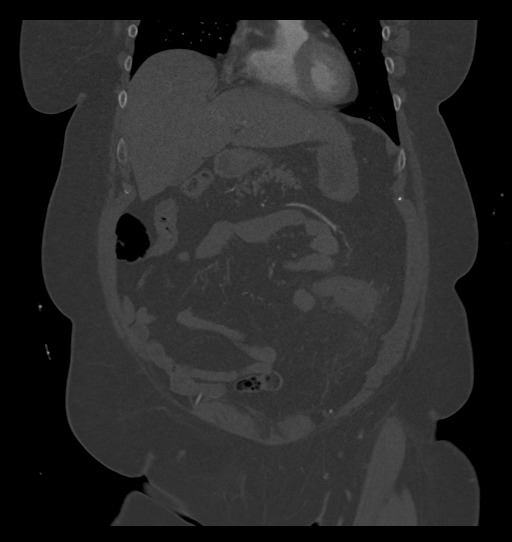
[im 104/156  soft-tissue]
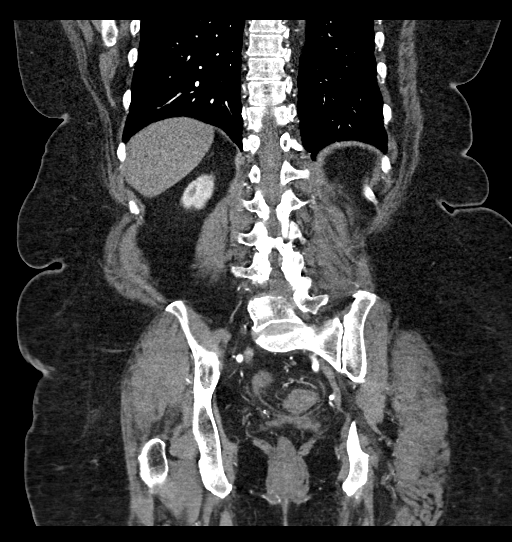

[Series 12: portal venous · axial · portal-venous · 0.98mm/px · z∈[+999,+1324]mm · 6 of 93 slices shown, 11 images]
[im 14/93  soft-tissue]
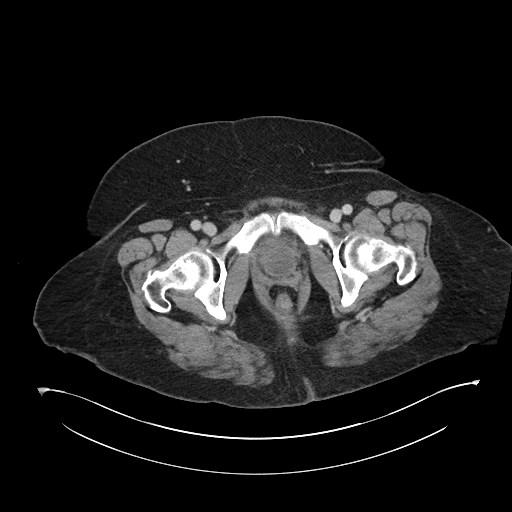
[im 14/93  bone]
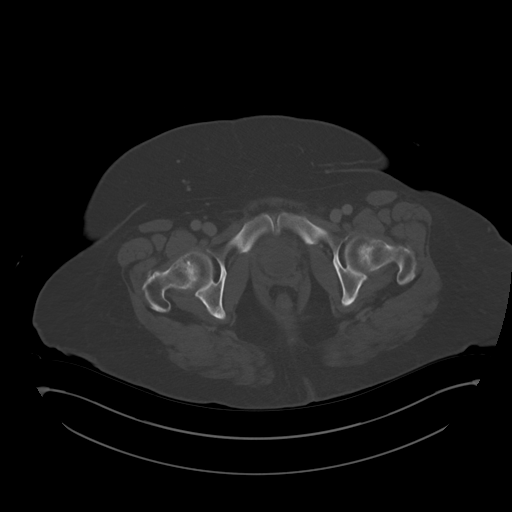
[im 27/93  soft-tissue]
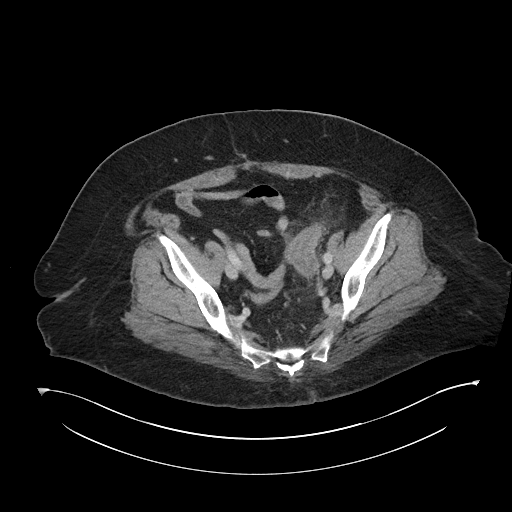
[im 40/93  soft-tissue]
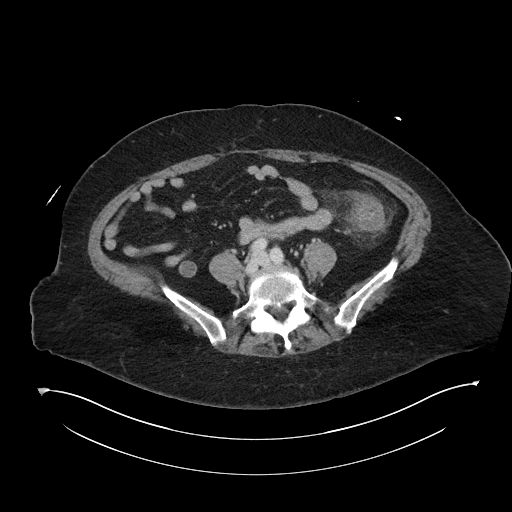
[im 40/93  lung]
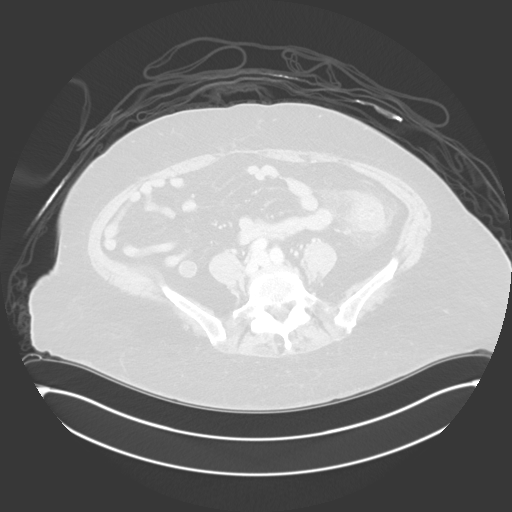
[im 53/93  soft-tissue]
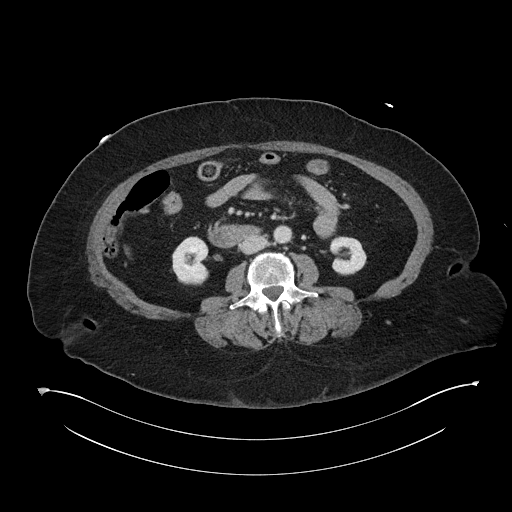
[im 53/93  lung]
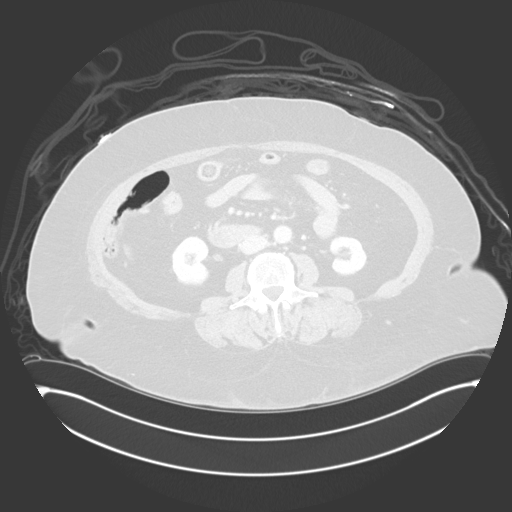
[im 66/93  soft-tissue]
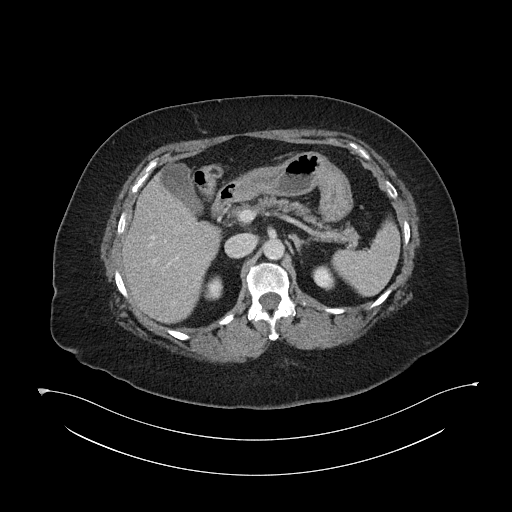
[im 66/93  lung]
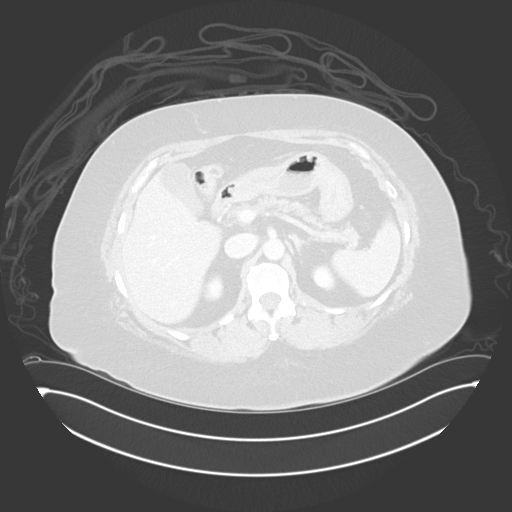
[im 79/93  soft-tissue]
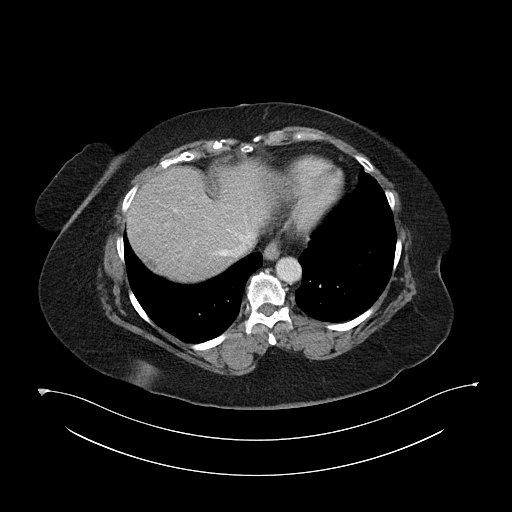
[im 79/93  lung]
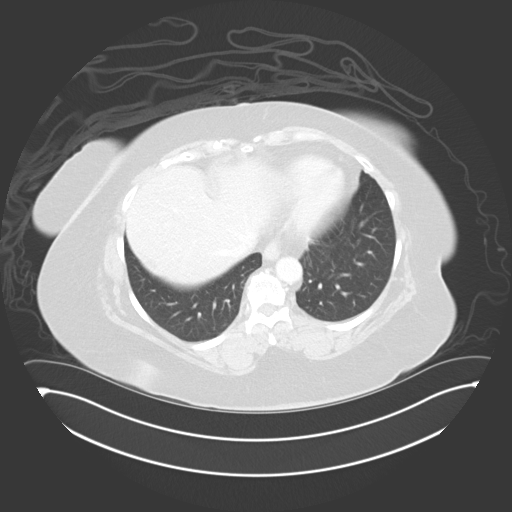

[11 of 46 positions shown; findings below may reference images not displayed]

FINDINGS: VASCULAR

Aorta: Normal caliber aorta without aneurysm, dissection, vasculitis
or significant stenosis. There is mild scattered calcified
atherosclerotic plaque.

Celiac: Patent without evidence of aneurysm, dissection, vasculitis
or significant stenosis.

SMA: Patent without evidence of aneurysm, dissection, vasculitis or
significant stenosis.

Renals: There is mild calcified atherosclerotic plaque at the
origins of the renal arteries without significant stenosis. There is
no evidence of aneurysm, dissection, vasculitis, or fibromuscular
dysplasia.

IMA: Patent without evidence of aneurysm, dissection, vasculitis or
significant stenosis.

Inflow: Patent without evidence of aneurysm, dissection, vasculitis
or significant stenosis.

Proximal Outflow: Bilateral common femoral and visualized portions
of the superficial and profunda femoral arteries are patent without
evidence of aneurysm, dissection, vasculitis or significant
stenosis.

Veins: The main portal and splenic veins are patent on the venous
phase images. The IVC is patent.

There is no evidence of active GI bleed.

Review of the MIP images confirms the above findings.

NON-VASCULAR

Lower chest: The lung bases are clear. The imaged heart is
unremarkable.

Hepatobiliary: The liver and gallbladder are unremarkable. There is
no biliary ductal dilatation.

Pancreas: There is fatty atrophy of the pancreatic head. There are
no focal lesions or contour abnormalities. There is no main
pancreatic ductal dilatation or peripancreatic inflammatory change.

Spleen: Unremarkable.

Adrenals/Urinary Tract: The adrenals are unremarkable.

A small hypodense lesion in the right upper pole most likely
reflects a small cyst. The kidneys are otherwise unremarkable, with
no other focal lesion, stone, hydronephrosis, or hydroureter. The
bladder is decompressed but grossly unremarkable.

Stomach/Bowel: Stomach is unremarkable. There is no evidence of
bowel obstruction.

There is marked wall thickening involving the descending and sigmoid
colon with significant surrounding inflammatory change and mucosal
hyperemia consistent with colitis. The remainder of the bowel is
normal in appearance.

Lymphatic: There is no abdominal or pelvic lymphadenopathy.

Reproductive: The uterus is surgically absent. There is no adnexal
mass.

Other: There is trace free fluid in the left pericolic gutter,
likely reactive. There is no ascites elsewhere. There is no free
intraperitoneal air.

Musculoskeletal: There is no acute osseous abnormality or aggressive
osseous lesion.
IMPRESSION: 1. No evidence of active GI bleed.
2. Marked wall thickening and surrounding inflammatory change
involving the descending and sigmoid colon consistent with
infectious or inflammatory colitis. Recommend follow-up CT
abdomen/pelvis after resolution of symptoms to exclude underlying
mass lesion.

Aortic Atherosclerosis (3O34C-DHI.I).

## 2022-07-12 DIAGNOSIS — E1122 Type 2 diabetes mellitus with diabetic chronic kidney disease: Secondary | ICD-10-CM | POA: Diagnosis not present

## 2022-07-30 ENCOUNTER — Other Ambulatory Visit (INDEPENDENT_AMBULATORY_CARE_PROVIDER_SITE_OTHER): Payer: Medicare Other

## 2022-07-30 ENCOUNTER — Ambulatory Visit (INDEPENDENT_AMBULATORY_CARE_PROVIDER_SITE_OTHER): Payer: Medicare Other | Admitting: Internal Medicine

## 2022-07-30 ENCOUNTER — Encounter: Payer: Self-pay | Admitting: Internal Medicine

## 2022-07-30 VITALS — BP 130/58 | HR 62 | Temp 98.5°F | Resp 16 | Ht 61.0 in | Wt 246.0 lb

## 2022-07-30 DIAGNOSIS — E785 Hyperlipidemia, unspecified: Secondary | ICD-10-CM | POA: Diagnosis not present

## 2022-07-30 DIAGNOSIS — E039 Hypothyroidism, unspecified: Secondary | ICD-10-CM

## 2022-07-30 DIAGNOSIS — I10 Essential (primary) hypertension: Secondary | ICD-10-CM

## 2022-07-30 DIAGNOSIS — R10814 Left lower quadrant abdominal tenderness: Secondary | ICD-10-CM

## 2022-07-30 DIAGNOSIS — N1831 Chronic kidney disease, stage 3a: Secondary | ICD-10-CM | POA: Diagnosis not present

## 2022-07-30 DIAGNOSIS — E781 Pure hyperglyceridemia: Secondary | ICD-10-CM

## 2022-07-30 DIAGNOSIS — Z794 Long term (current) use of insulin: Secondary | ICD-10-CM

## 2022-07-30 DIAGNOSIS — E119 Type 2 diabetes mellitus without complications: Secondary | ICD-10-CM | POA: Diagnosis not present

## 2022-07-30 DIAGNOSIS — I340 Nonrheumatic mitral (valve) insufficiency: Secondary | ICD-10-CM

## 2022-07-30 LAB — HEMOGLOBIN A1C: Hgb A1c MFr Bld: 7.1 % — ABNORMAL HIGH (ref 4.6–6.5)

## 2022-07-30 NOTE — Progress Notes (Signed)
Subjective:  Patient ID: Meredith Hayden, female    DOB: [birth year]  Age: 73 y.o. MRN: [id number]  CC: Abdominal Pain, Hypertension, Diabetes, and Hyperlipidemia   HPI Nevelyn Mellott presents for f/up -  She complains of a 54-monthhistory of left-sided abdominal pain that she describes as sharp and stabbing.  She has alternating constipation and diarrhea.  She is taking Metamucil.  She is incontinent of stool and urine.  Outpatient Medications Prior to Visit  Medication Sig Dispense Refill   aspirin 81 MG tablet Take 81 mg by mouth daily.      calcium carbonate (TUMS - DOSED IN MG ELEMENTAL CALCIUM) 500 MG chewable tablet Chew 1 tablet by mouth daily as needed for indigestion or heartburn.     cetirizine (ZYRTEC) 10 MG tablet Take 10 mg by mouth daily.      Cholecalciferol (VITAMIN D3) 125 MCG (5000 UT) TBDP Take 5,000 Units by mouth daily.     clobetasol ointment (TEMOVATE) 04.03% Apply 1 Application topically 2 (two) times daily.     Continuous Blood Gluc Receiver (FREESTYLE LIBRE 2 READER) DEVI 1 Act by Does not apply route daily. 2 each 5   Continuous Blood Gluc Sensor (FREESTYLE LIBRE 2 SENSOR) MISC 1 Act by Does not apply route daily. 2 each 5   cyanocobalamin 100 MCG tablet Take 100 mcg by mouth daily.     dicyclomine (BENTYL) 10 MG capsule TAKE 1 CAPSULE BY MOUTH 3 TIMES  DAILY BEFORE MEALS 270 capsule 0   fluticasone (FLONASE) 50 MCG/ACT nasal spray Place 1 spray into both nostrils daily.     gabapentin (NEURONTIN) 600 MG tablet Take 1,200 mg by mouth 2 (two) times daily.     Glucagon (GVOKE HYPOPEN 2-PACK) 1 MG/0.2ML SOAJ Inject 1 Act into the skin daily as needed. 2 mL 5   HUMALOG KWIKPEN 100 UNIT/ML KwikPen INJECT SUBCUTANEOUSLY 16  UNITS 3 TIMES DAILY 45 mL 3   ipratropium (ATROVENT HFA) 17 MCG/ACT inhaler USE 2 PUFFS EVERY 4 HOURS  AS NEEDED FOR WHEEZING. 12.9 g 3   KERENDIA 20 MG TABS TAKE 1 TABLET BY MOUTH DAILY 100 tablet 1   Lancets (ONETOUCH DELICA PLUS  [id number] MISC USE AS DIRECTED TO CHECK BLOOD SUGAR 4 TIMES DAILY AFTER MEALS AND AT BEDTIME. 200 each 4   lisinopril (ZESTRIL) 20 MG tablet TAKE 1 TABLET BY MOUTH DAILY 100 tablet 1   loperamide (IMODIUM) 2 MG capsule Take by mouth as needed for diarrhea or loose stools.     metoprolol tartrate (LOPRESSOR) 25 MG tablet TAKE 1 TABLET BY MOUTH TWICE  DAILY 200 tablet 0   nystatin ointment (MYCOSTATIN) Apply 1 Application topically 2 (two) times daily.     omeprazole (PRILOSEC) 20 MG capsule Take 20 mg by mouth daily as needed.     ONETOUCH VERIO test strip CHECK BLOOD SUGAR 4 TIMES A DAY. 200 strip 5   rosuvastatin (CRESTOR) 20 MG tablet TAKE 1 TABLET BY MOUTH DAILY 100 tablet 1   Simethicone 125 MG CAPS Take 1 capsule by mouth daily as needed.     SYNTHROID 75 MCG tablet TAKE 1 TABLET DAILY BEFORE BREAKFAST FOR HYPOTHYROIDISM 90 tablet 1   TOUJEO SOLOSTAR 300 UNIT/ML Solostar Pen INJECT 50 UNITS INTO THE  SKIN DAILY 18 mL 3   UNIFINE PENTIPS PLUS 33G X 4 MM MISC USE FOUR TIMES DAILY( AFTER MEALS AND AT BEDTIME) 300 each 3   Vilazodone HCl (VIIBRYD) 40 MG TABS Take  by mouth daily.     zaleplon (SONATA) 10 MG capsule Take 10 mg by mouth at bedtime as needed for sleep.     Plecanatide (TRULANCE) 3 MG TABS Take 1 tablet by mouth daily. 90 tablet 1   TRINTELLIX 20 MG TABS tablet Take 20 mg by mouth daily.     zolpidem (AMBIEN) 5 MG tablet Take 5 mg by mouth at bedtime.     azelastine (ASTELIN) 137 MCG/SPRAY nasal spray 1 spray by Nasal route 2 (two) times daily. Use in each nostril as directed      budesonide-formoterol (SYMBICORT) 160-4.5 MCG/ACT inhaler Inhale 2 puffs into the lungs 2 (two) times daily.       diphenhydrAMINE (SOMINEX) 25 MG tablet Take 25 mg by mouth at bedtime as needed.       temazepam (RESTORIL) 30 MG capsule Take 30 mg by mouth at bedtime as needed.     No facility-administered medications prior to visit.    ROS Review of Systems  Constitutional:  Positive for unexpected  weight change (wt gain). Negative for appetite change, chills, diaphoresis and fatigue.  HENT: Negative.    Respiratory: Negative.  Negative for cough, chest tightness, shortness of breath and wheezing.   Cardiovascular:  Negative for chest pain, palpitations and leg swelling.  Gastrointestinal:  Positive for abdominal pain, constipation and diarrhea. Negative for blood in stool, nausea and vomiting.  Endocrine: Negative.   Genitourinary:  Negative for difficulty urinating and dysuria.  Musculoskeletal: Negative.   Skin: Negative.   Neurological:  Negative for weakness and headaches.  Hematological:  Negative for adenopathy. Does not bruise/bleed easily.  Psychiatric/Behavioral: Negative.      Objective:  BP (!) 130/58 (BP Location: Right Arm, Patient Position: Sitting, Cuff Size: Large)   Pulse 62   Temp 98.5 F (36.9 C) (Oral)   Resp 16   Ht '5\' 1"'$  (1.549 m)   Wt 246 lb (111.6 kg)   LMP 12/31/1993   SpO2 96%   BMI 46.48 kg/m   BP Readings from Last 3 Encounters:  07/30/22 (!) 130/58  04/05/22 132/80  12/13/21 126/78    Wt Readings from Last 3 Encounters:  07/30/22 246 lb (111.6 kg)  04/05/22 231 lb (104.8 kg)  12/13/21 223 lb (101.2 kg)    Physical Exam Vitals reviewed.  Constitutional:      General: She is not in acute distress.    Appearance: She is obese. She is not toxic-appearing or diaphoretic.  HENT:     Nose: Nose normal.     Mouth/Throat:     Mouth: Mucous membranes are moist.  Eyes:     General: No scleral icterus.    Conjunctiva/sclera: Conjunctivae normal.  Cardiovascular:     Rate and Rhythm: Regular rhythm. Bradycardia present.     Heart sounds: S1 normal and S2 normal. Heart sounds are distant. No murmur heard.    No friction rub. No gallop.     Comments: EKG- SB, 58 bpm TWI in V1 is new Low voltage  Inf/ant/lat Infarct pattern is old Pulmonary:     Effort: Pulmonary effort is normal.     Breath sounds: No stridor. No wheezing, rhonchi or  rales.  Abdominal:     General: Abdomen is protuberant. Bowel sounds are normal. There is no distension.     Palpations: Abdomen is soft. There is no hepatomegaly, splenomegaly or mass.     Tenderness: There is abdominal tenderness in the left upper quadrant and left lower quadrant. There  is no guarding or rebound.  Musculoskeletal:     Cervical back: Neck supple.     Right lower leg: No edema.     Left lower leg: No edema.  Lymphadenopathy:     Cervical: No cervical adenopathy.  Skin:    General: Skin is warm and dry.  Neurological:     General: No focal deficit present.     Mental Status: She is alert.  Psychiatric:        Mood and Affect: Mood normal.        Behavior: Behavior normal.     Lab Results  Component Value Date   WBC 9.2 07/30/2022   HGB 12.6 07/30/2022   HCT 38.4 07/30/2022   PLT 194.0 07/30/2022   GLUCOSE 105 (H) 07/30/2022   CHOL 145 07/30/2022   TRIG 278.0 (H) 07/30/2022   HDL 46.70 07/30/2022   LDLDIRECT 70.0 07/30/2022   LDLCALC 60 02/10/2020   ALT 9 07/30/2022   AST 14 07/30/2022   NA 139 07/30/2022   K 4.5 07/30/2022   CL 104 07/30/2022   CREATININE 1.64 (H) 07/30/2022   BUN 31 (H) 07/30/2022   CO2 26 07/30/2022   TSH 0.98 07/30/2022   INR 1.1 11/29/2021   HGBA1C 7.1 (H) 07/30/2022   MICROALBUR <0.7 07/30/2022    CT ANGIO GI BLEED  Result Date: 11/29/2021 CLINICAL DATA:  Bright red blood in stool, emesis EXAM: CTA ABDOMEN AND PELVIS WITHOUT AND WITH CONTRAST TECHNIQUE: Multidetector CT imaging of the abdomen and pelvis was performed using the standard protocol during bolus administration of intravenous contrast. Multiplanar reconstructed images and MIPs were obtained and reviewed to evaluate the vascular anatomy. CONTRAST:  160m OMNIPAQUE IOHEXOL 350 MG/ML SOLN COMPARISON:  CT abdomen/pelvis 05/25/2020 FINDINGS: VASCULAR Aorta: Normal caliber aorta without aneurysm, dissection, vasculitis or significant stenosis. There is mild scattered  calcified atherosclerotic plaque. Celiac: Patent without evidence of aneurysm, dissection, vasculitis or significant stenosis. SMA: Patent without evidence of aneurysm, dissection, vasculitis or significant stenosis. Renals: There is mild calcified atherosclerotic plaque at the origins of the renal arteries without significant stenosis. There is no evidence of aneurysm, dissection, vasculitis, or fibromuscular dysplasia. IMA: Patent without evidence of aneurysm, dissection, vasculitis or significant stenosis. Inflow: Patent without evidence of aneurysm, dissection, vasculitis or significant stenosis. Proximal Outflow: Bilateral common femoral and visualized portions of the superficial and profunda femoral arteries are patent without evidence of aneurysm, dissection, vasculitis or significant stenosis. Veins: The main portal and splenic veins are patent on the venous phase images. The IVC is patent. There is no evidence of active GI bleed. Review of the MIP images confirms the above findings. NON-VASCULAR Lower chest: The lung bases are clear. The imaged heart is unremarkable. Hepatobiliary: The liver and gallbladder are unremarkable. There is no biliary ductal dilatation. Pancreas: There is fatty atrophy of the pancreatic head. There are no focal lesions or contour abnormalities. There is no main pancreatic ductal dilatation or peripancreatic inflammatory change. Spleen: Unremarkable. Adrenals/Urinary Tract: The adrenals are unremarkable. A small hypodense lesion in the right upper pole most likely reflects a small cyst. The kidneys are otherwise unremarkable, with no other focal lesion, stone, hydronephrosis, or hydroureter. The bladder is decompressed but grossly unremarkable. Stomach/Bowel: Stomach is unremarkable. There is no evidence of bowel obstruction. There is marked wall thickening involving the descending and sigmoid colon with significant surrounding inflammatory change and mucosal hyperemia consistent  with colitis. The remainder of the bowel is normal in appearance. Lymphatic: There is no abdominal  or pelvic lymphadenopathy. Reproductive: The uterus is surgically absent. There is no adnexal mass. Other: There is trace free fluid in the left pericolic gutter, likely reactive. There is no ascites elsewhere. There is no free intraperitoneal air. Musculoskeletal: There is no acute osseous abnormality or aggressive osseous lesion. IMPRESSION: 1. No evidence of active GI bleed. 2. Marked wall thickening and surrounding inflammatory change involving the descending and sigmoid colon consistent with infectious or inflammatory colitis. Recommend follow-up CT abdomen/pelvis after resolution of symptoms to exclude underlying mass lesion. Aortic Atherosclerosis (ICD10-I70.0). Electronically Signed   By: Valetta Mole M.D.   On: 11/29/2021 10:24    Assessment & Plan:   Gracelin was seen today for abdominal pain, hypertension, diabetes and hyperlipidemia.  Diagnoses and all orders for this visit:  Essential hypertension- Her blood pressure is well controlled. -     Basic metabolic panel; Future -     TSH; Future -     Urinalysis, Routine w reflex microscopic; Future -     Hepatic function panel; Future -     CBC with Differential/Platelet; Future -     EKG 12-Lead  Insulin-requiring or dependent type II diabetes mellitus ([place])- Her blood sugar is adequately well controlled. -     Basic metabolic panel; Future -     Hemoglobin A1c; Future -     Microalbumin / creatinine urine ratio; Future  Acquired hypothyroidism- She is euthyroid. -     TSH; Future  Stage 3a chronic kidney disease ([place])- Her renal function is stable. -     Urinalysis, Routine w reflex microscopic; Future -     Microalbumin / creatinine urine ratio; Future  Pure hypertriglyceridemia -     Lipid panel; Future -     Hepatic function panel; Future  Hyperlipidemia LDL goal <130- LDL goal achieved. Doing well on the statin  -     Lipid  panel; Future -     Hepatic function panel; Future  Left lower quadrant abdominal tenderness without rebound tenderness- Her labs and examination are not consistent with an acute abdominal process.  I think she is symptomatic from IBS.  Nonrheumatic mitral valve regurgitation- She has a wide pulse pressure.  I recommended an echocardiogram to evaluate for valvular heart disease. -     ECHOCARDIOGRAM COMPLETE; Future   I have discontinued Aldine Contes "Chris"'s azelastine, diphenhydrAMINE, budesonide-formoterol, temazepam, zolpidem, Trintellix, and Trulance. I am also having her maintain her aspirin, cetirizine, cyanocobalamin, Vitamin D3, ipratropium, OneTouch Delica Plus [id number], OneTouch Verio, G[employer]oke HypoPen 2-Pack, loperamide, Simethicone, omeprazole, calcium carbonate, fluticasone, FreeStyle Libre 2 Sensor, [employer] 2 Reader, [employer], [employer] KwikPen, dicyclomine, gabapentin, Synthroid, rosuvastatin, lisinopril, Kerendia, Unifine Pentips Plus, metoprolol tartrate, Vilazodone HCl, zaleplon, clobetasol ointment, and nystatin ointment.  No orders of the defined types were placed in this encounter.    Follow-up: No follow-ups on file.  Scarlette Calico, MD

## 2022-07-31 LAB — LIPID PANEL
Cholesterol: 145 mg/dL (ref 0–200)
HDL: 46.7 mg/dL (ref >39.00)
NonHDL: 98.47
Total CHOL/HDL Ratio: 3
Triglycerides: 278 mg/dL — ABNORMAL HIGH (ref 0.0–149.0)
VLDL: 55.6 mg/dL — ABNORMAL HIGH (ref 0.0–40.0)

## 2022-07-31 LAB — URINALYSIS, ROUTINE W REFLEX MICROSCOPIC
Bilirubin Urine: NEGATIVE
Hgb urine dipstick: NEGATIVE
Ketones, ur: NEGATIVE
Nitrite: POSITIVE — AB
RBC / HPF: NONE SEEN (ref 0–?)
Specific Gravity, Urine: 1.025 (ref 1.000–1.030)
Total Protein, Urine: NEGATIVE
Urine Glucose: NEGATIVE
Urobilinogen, UA: 0.2 (ref 0.0–1.0)
pH: 5.5 (ref 5.0–8.0)

## 2022-07-31 LAB — CBC WITH DIFFERENTIAL/PLATELET
Basophils Absolute: 0.1 10*3/uL (ref 0.0–0.1)
Basophils Relative: 0.7 % (ref 0.0–3.0)
Eosinophils Absolute: 0.2 10*3/uL (ref 0.0–0.7)
Eosinophils Relative: 2 % (ref 0.0–5.0)
HCT: 38.4 % (ref 36.0–46.0)
Hemoglobin: 12.6 g/dL (ref 12.0–15.0)
Lymphocytes Relative: 32.9 % (ref 12.0–46.0)
Lymphs Abs: 3 10*3/uL (ref 0.7–4.0)
MCHC: 32.8 g/dL (ref 30.0–36.0)
MCV: 94.6 fl (ref 78.0–100.0)
Monocytes Absolute: 0.7 10*3/uL (ref 0.1–1.0)
Monocytes Relative: 7.7 % (ref 3.0–12.0)
Neutro Abs: 5.2 10*3/uL (ref 1.4–7.7)
Neutrophils Relative %: 56.7 % (ref 43.0–77.0)
Platelets: 194 10*3/uL (ref 150.0–400.0)
RBC: 4.06 Mil/uL (ref 3.87–5.11)
RDW: 12.9 % (ref 11.5–15.5)
WBC: 9.2 10*3/uL (ref 4.0–10.5)

## 2022-07-31 LAB — BASIC METABOLIC PANEL
BUN: 31 mg/dL — ABNORMAL HIGH (ref 6–23)
CO2: 26 mEq/L (ref 19–32)
Calcium: 9 mg/dL (ref 8.4–10.5)
Chloride: 104 mEq/L (ref 96–112)
Creatinine, Ser: 1.64 mg/dL — ABNORMAL HIGH (ref 0.40–1.20)
GFR: 31.67 mL/min — ABNORMAL LOW (ref >60.00)
Glucose, Bld: 105 mg/dL — ABNORMAL HIGH (ref 70–99)
Potassium: 4.5 mEq/L (ref 3.5–5.1)
Sodium: 139 mEq/L (ref 135–145)

## 2022-07-31 LAB — HEPATIC FUNCTION PANEL
ALT: 9 U/L (ref 0–35)
AST: 14 U/L (ref 0–37)
Albumin: 4.1 g/dL (ref 3.5–5.2)
Alkaline Phosphatase: 58 U/L (ref 39–117)
Bilirubin, Direct: 0 mg/dL (ref 0.0–0.3)
Total Bilirubin: 0.4 mg/dL (ref 0.2–1.2)
Total Protein: 6.8 g/dL (ref 6.0–8.3)

## 2022-07-31 LAB — MICROALBUMIN / CREATININE URINE RATIO
Creatinine,U: 192.6 mg/dL
Microalb Creat Ratio: 0.4 mg/g (ref 0.0–30.0)
Microalb, Ur: <0.7 mg/dL (ref 0.0–1.9)

## 2022-07-31 LAB — TSH: TSH: 0.98 u[IU]/mL (ref 0.35–5.50)

## 2022-07-31 LAB — LDL CHOLESTEROL, DIRECT: Direct LDL: 70 mg/dL

## 2022-08-02 ENCOUNTER — Encounter: Payer: Self-pay | Admitting: Internal Medicine

## 2022-08-11 DIAGNOSIS — E1122 Type 2 diabetes mellitus with diabetic chronic kidney disease: Secondary | ICD-10-CM | POA: Diagnosis not present

## 2022-08-20 ENCOUNTER — Ambulatory Visit (HOSPITAL_COMMUNITY): Payer: Medicare Other | Attending: Cardiovascular Disease

## 2022-08-20 DIAGNOSIS — I340 Nonrheumatic mitral (valve) insufficiency: Secondary | ICD-10-CM | POA: Insufficient documentation

## 2022-08-20 LAB — ECHOCARDIOGRAM COMPLETE
Area-P 1/2: 2.27 cm2
S' Lateral: 2.8 cm

## 2022-08-31 ENCOUNTER — Other Ambulatory Visit: Payer: Self-pay | Admitting: Internal Medicine

## 2022-08-31 DIAGNOSIS — I10 Essential (primary) hypertension: Secondary | ICD-10-CM

## 2022-09-04 DIAGNOSIS — E1122 Type 2 diabetes mellitus with diabetic chronic kidney disease: Secondary | ICD-10-CM | POA: Diagnosis not present

## 2022-09-06 DIAGNOSIS — N398 Other specified disorders of urinary system: Secondary | ICD-10-CM | POA: Diagnosis not present

## 2022-09-06 DIAGNOSIS — K582 Mixed irritable bowel syndrome: Secondary | ICD-10-CM | POA: Diagnosis not present

## 2022-10-04 DIAGNOSIS — E1122 Type 2 diabetes mellitus with diabetic chronic kidney disease: Secondary | ICD-10-CM | POA: Diagnosis not present

## 2022-10-18 ENCOUNTER — Other Ambulatory Visit: Payer: Self-pay | Admitting: Internal Medicine

## 2022-10-18 DIAGNOSIS — E11319 Type 2 diabetes mellitus with unspecified diabetic retinopathy without macular edema: Secondary | ICD-10-CM

## 2022-10-18 DIAGNOSIS — Z794 Long term (current) use of insulin: Secondary | ICD-10-CM

## 2022-10-27 ENCOUNTER — Other Ambulatory Visit: Payer: Self-pay | Admitting: Internal Medicine

## 2022-11-03 DIAGNOSIS — E1122 Type 2 diabetes mellitus with diabetic chronic kidney disease: Secondary | ICD-10-CM | POA: Diagnosis not present

## 2022-11-09 ENCOUNTER — Other Ambulatory Visit: Payer: Self-pay | Admitting: Internal Medicine

## 2022-11-09 DIAGNOSIS — E785 Hyperlipidemia, unspecified: Secondary | ICD-10-CM

## 2022-11-09 DIAGNOSIS — E11319 Type 2 diabetes mellitus with unspecified diabetic retinopathy without macular edema: Secondary | ICD-10-CM

## 2022-11-09 DIAGNOSIS — I10 Essential (primary) hypertension: Secondary | ICD-10-CM

## 2022-12-06 ENCOUNTER — Other Ambulatory Visit: Payer: Self-pay | Admitting: Internal Medicine

## 2022-12-06 ENCOUNTER — Telehealth: Payer: Self-pay | Admitting: Internal Medicine

## 2022-12-06 DIAGNOSIS — Z794 Long term (current) use of insulin: Secondary | ICD-10-CM

## 2022-12-06 DIAGNOSIS — N1831 Chronic kidney disease, stage 3a: Secondary | ICD-10-CM

## 2022-12-06 DIAGNOSIS — E11319 Type 2 diabetes mellitus with unspecified diabetic retinopathy without macular edema: Secondary | ICD-10-CM

## 2022-12-06 NOTE — Telephone Encounter (Signed)
[facility] called in reference to a fax sent on 11/15/22. They need Dr. [name] to addend the fax and add to the notes if the patient is still using the glucose monitor and then sign and date. If there are any questions you can call the [facility] callback line at [phone] and use reference number [id number].

## 2022-12-06 NOTE — Telephone Encounter (Signed)
Fax was originally returned on 11/28. Will resend.

## 2022-12-10 ENCOUNTER — Telehealth: Payer: Self-pay | Admitting: Internal Medicine

## 2022-12-10 NOTE — Telephone Encounter (Signed)
7/31 OV note, med list, and form has been faxed again.

## 2022-12-10 NOTE — Telephone Encounter (Signed)
Meredith Hayden called back stating that still have not received a fax of the Addendum office notes of 07/20/2022. Meredith Hayden stated that it has to be labeled as addendum. Along with medication list. Forms can be faxed to [phone].

## 2022-12-12 DIAGNOSIS — E118 Type 2 diabetes mellitus with unspecified complications: Secondary | ICD-10-CM | POA: Diagnosis not present

## 2022-12-12 DIAGNOSIS — E1122 Type 2 diabetes mellitus with diabetic chronic kidney disease: Secondary | ICD-10-CM | POA: Diagnosis not present

## 2022-12-12 DIAGNOSIS — Z794 Long term (current) use of insulin: Secondary | ICD-10-CM | POA: Diagnosis not present

## 2022-12-21 ENCOUNTER — Other Ambulatory Visit: Payer: Self-pay | Admitting: Internal Medicine

## 2022-12-21 DIAGNOSIS — E039 Hypothyroidism, unspecified: Secondary | ICD-10-CM

## 2023-01-07 ENCOUNTER — Telehealth: Payer: Self-pay | Admitting: Internal Medicine

## 2023-01-07 NOTE — Telephone Encounter (Signed)
Left message for patient to call back to schedule Medicare Annual Wellness Visit   Last AWV  01/18/22  Please schedule at anytime with [facility] if patient calls the office back.     Any questions, please call me at [phone]

## 2023-01-11 DIAGNOSIS — Z794 Long term (current) use of insulin: Secondary | ICD-10-CM | POA: Diagnosis not present

## 2023-01-11 DIAGNOSIS — E118 Type 2 diabetes mellitus with unspecified complications: Secondary | ICD-10-CM | POA: Diagnosis not present

## 2023-01-11 DIAGNOSIS — E1122 Type 2 diabetes mellitus with diabetic chronic kidney disease: Secondary | ICD-10-CM | POA: Diagnosis not present

## 2023-01-18 ENCOUNTER — Other Ambulatory Visit: Payer: Self-pay | Admitting: Internal Medicine

## 2023-01-18 DIAGNOSIS — I10 Essential (primary) hypertension: Secondary | ICD-10-CM

## 2023-01-18 DIAGNOSIS — E11319 Type 2 diabetes mellitus with unspecified diabetic retinopathy without macular edema: Secondary | ICD-10-CM

## 2023-01-18 DIAGNOSIS — E785 Hyperlipidemia, unspecified: Secondary | ICD-10-CM

## 2023-01-19 ENCOUNTER — Other Ambulatory Visit: Payer: Self-pay | Admitting: Internal Medicine

## 2023-01-19 DIAGNOSIS — E11319 Type 2 diabetes mellitus with unspecified diabetic retinopathy without macular edema: Secondary | ICD-10-CM

## 2023-01-19 DIAGNOSIS — I10 Essential (primary) hypertension: Secondary | ICD-10-CM

## 2023-01-19 DIAGNOSIS — E785 Hyperlipidemia, unspecified: Secondary | ICD-10-CM

## 2023-01-31 ENCOUNTER — Encounter: Payer: Self-pay | Admitting: Internal Medicine

## 2023-01-31 ENCOUNTER — Ambulatory Visit (INDEPENDENT_AMBULATORY_CARE_PROVIDER_SITE_OTHER): Payer: Medicare Other | Admitting: Internal Medicine

## 2023-01-31 VITALS — BP 134/78 | HR 70 | Temp 98.4°F | Resp 16 | Ht 61.0 in | Wt 254.0 lb

## 2023-01-31 DIAGNOSIS — R300 Dysuria: Secondary | ICD-10-CM | POA: Diagnosis not present

## 2023-01-31 DIAGNOSIS — E785 Hyperlipidemia, unspecified: Secondary | ICD-10-CM | POA: Diagnosis not present

## 2023-01-31 DIAGNOSIS — Z1231 Encounter for screening mammogram for malignant neoplasm of breast: Secondary | ICD-10-CM

## 2023-01-31 DIAGNOSIS — Z0001 Encounter for general adult medical examination with abnormal findings: Secondary | ICD-10-CM | POA: Diagnosis not present

## 2023-01-31 DIAGNOSIS — R10817 Generalized abdominal tenderness: Secondary | ICD-10-CM | POA: Diagnosis not present

## 2023-01-31 DIAGNOSIS — E781 Pure hyperglyceridemia: Secondary | ICD-10-CM | POA: Diagnosis not present

## 2023-01-31 DIAGNOSIS — E119 Type 2 diabetes mellitus without complications: Secondary | ICD-10-CM | POA: Diagnosis not present

## 2023-01-31 DIAGNOSIS — I10 Essential (primary) hypertension: Secondary | ICD-10-CM | POA: Diagnosis not present

## 2023-01-31 DIAGNOSIS — Z794 Long term (current) use of insulin: Secondary | ICD-10-CM

## 2023-01-31 DIAGNOSIS — E039 Hypothyroidism, unspecified: Secondary | ICD-10-CM | POA: Diagnosis not present

## 2023-01-31 DIAGNOSIS — N3000 Acute cystitis without hematuria: Secondary | ICD-10-CM | POA: Diagnosis not present

## 2023-01-31 DIAGNOSIS — Z23 Encounter for immunization: Secondary | ICD-10-CM

## 2023-01-31 DIAGNOSIS — N1831 Chronic kidney disease, stage 3a: Secondary | ICD-10-CM

## 2023-01-31 LAB — URINALYSIS, ROUTINE W REFLEX MICROSCOPIC
Bilirubin Urine: NEGATIVE
Ketones, ur: NEGATIVE
Nitrite: NEGATIVE
Specific Gravity, Urine: 1.01 (ref 1.000–1.030)
Urine Glucose: NEGATIVE
Urobilinogen, UA: 0.2 (ref 0.0–1.0)
pH: 6 (ref 5.0–8.0)

## 2023-01-31 LAB — BASIC METABOLIC PANEL
BUN: 25 mg/dL — ABNORMAL HIGH (ref 6–23)
CO2: 28 mEq/L (ref 19–32)
Calcium: 8.9 mg/dL (ref 8.4–10.5)
Chloride: 103 mEq/L (ref 96–112)
Creatinine, Ser: 1.47 mg/dL — ABNORMAL HIGH (ref 0.40–1.20)
GFR: 35.99 mL/min — ABNORMAL LOW (ref >60.00)
Glucose, Bld: 55 mg/dL — ABNORMAL LOW (ref 70–99)
Potassium: 4.7 mEq/L (ref 3.5–5.1)
Sodium: 139 mEq/L (ref 135–145)

## 2023-01-31 LAB — LIPASE: Lipase: 36 U/L (ref 11.0–59.0)

## 2023-01-31 LAB — CBC WITH DIFFERENTIAL/PLATELET
Basophils Absolute: 0.1 10*3/uL (ref 0.0–0.1)
Basophils Relative: 0.6 % (ref 0.0–3.0)
Eosinophils Absolute: 0.2 10*3/uL (ref 0.0–0.7)
Eosinophils Relative: 1.8 % (ref 0.0–5.0)
HCT: 38.2 % (ref 36.0–46.0)
Hemoglobin: 12.6 g/dL (ref 12.0–15.0)
Lymphocytes Relative: 29.6 % (ref 12.0–46.0)
Lymphs Abs: 4 10*3/uL (ref 0.7–4.0)
MCHC: 32.8 g/dL (ref 30.0–36.0)
MCV: 93.6 fl (ref 78.0–100.0)
Monocytes Absolute: 1.2 10*3/uL — ABNORMAL HIGH (ref 0.1–1.0)
Monocytes Relative: 8.7 % (ref 3.0–12.0)
Neutro Abs: 8 10*3/uL — ABNORMAL HIGH (ref 1.4–7.7)
Neutrophils Relative %: 59.3 % (ref 43.0–77.0)
Platelets: 198 10*3/uL (ref 150.0–400.0)
RBC: 4.09 Mil/uL (ref 3.87–5.11)
RDW: 13.2 % (ref 11.5–15.5)
WBC: 13.4 10*3/uL — ABNORMAL HIGH (ref 4.0–10.5)

## 2023-01-31 LAB — TRIGLYCERIDES: Triglycerides: 180 mg/dL — ABNORMAL HIGH (ref 0.0–149.0)

## 2023-01-31 LAB — HEPATIC FUNCTION PANEL
ALT: 11 U/L (ref 0–35)
AST: 18 U/L (ref 0–37)
Albumin: 4.3 g/dL (ref 3.5–5.2)
Alkaline Phosphatase: 64 U/L (ref 39–117)
Bilirubin, Direct: 0.1 mg/dL (ref 0.0–0.3)
Total Bilirubin: 0.5 mg/dL (ref 0.2–1.2)
Total Protein: 7 g/dL (ref 6.0–8.3)

## 2023-01-31 LAB — HEMOGLOBIN A1C: Hgb A1c MFr Bld: 6.9 % — ABNORMAL HIGH (ref 4.6–6.5)

## 2023-01-31 LAB — TSH: TSH: 0.72 u[IU]/mL (ref 0.35–5.50)

## 2023-01-31 MED ORDER — UNIFINE PENTIPS PLUS 33G X 4 MM MISC: 1.0000 | Freq: Four times a day (QID) | 1 refills | Status: AC

## 2023-01-31 MED ORDER — NITROFURANTOIN MONOHYD MACRO 100 MG PO CAPS: 100.0000 mg | ORAL_CAPSULE | Freq: Two times a day (BID) | ORAL | 0 refills | Status: DC

## 2023-01-31 NOTE — Patient Instructions (Signed)

## 2023-01-31 NOTE — Progress Notes (Signed)
Subjective:  Patient ID: Meredith Hayden, female    DOB: 1950/04/04  Age: 73 y.o. MRN: [id number]  CC: Annual Exam and Abdominal Pain   HPI Meredith Hayden presents for a CPX and f/up -----  She had an episode of chest heaviness at rest 3 days ago.  She has had no more chest pain since.  She has no exertional symptoms and denies diaphoresis, dyspnea on exertion, or palpitations.  She continues to complain of abdominal pain that she describes as a pinching sensation that occurs in several areas of her abdomen.  She complains of alternating constipation and diarrhea.  Her appetite is good, she denies melena, or bright red blood per rectum.  She also complains of a several day history of urinary urgency and dysuria.  Outpatient Medications Prior to Visit  Medication Sig Dispense Refill   aspirin 81 MG tablet Take 81 mg by mouth daily.      cetirizine (ZYRTEC) 10 MG tablet Take 10 mg by mouth daily.      Cholecalciferol (VITAMIN D3) 125 MCG (5000 UT) TBDP Take 5,000 Units by mouth daily.     clobetasol ointment (TEMOVATE) 4.12 % Apply 1 Application topically 2 (two) times daily.     Continuous Blood Gluc Receiver (FREESTYLE LIBRE 2 READER) DEVI 1 Act by Does not apply route daily. 2 each 5   Continuous Blood Gluc Sensor (FREESTYLE LIBRE 2 SENSOR) MISC 1 Act by Does not apply route daily. 2 each 5   cyanocobalamin 100 MCG tablet Take 100 mcg by mouth daily.     dicyclomine (BENTYL) 10 MG capsule TAKE 1 CAPSULE BY MOUTH 3 TIMES  DAILY BEFORE MEALS 270 capsule 0   Finerenone (KERENDIA) 20 MG TABS TAKE 1 TABLET BY MOUTH DAILY 100 tablet 0   fluticasone (FLONASE) 50 MCG/ACT nasal spray Place 1 spray into both nostrils daily.     gabapentin (NEURONTIN) 600 MG tablet Take 1,200 mg by mouth 2 (two) times daily.     Glucagon (GVOKE HYPOPEN 2-PACK) 1 MG/0.2ML SOAJ Inject 1 Act into the skin daily as needed. 2 mL 5   HUMALOG KWIKPEN 100 UNIT/ML KwikPen INJECT SUBCUTANEOUSLY 16 UNITS 3 TIMES  DAILY 60 mL 2   ipratropium (ATROVENT HFA) 17 MCG/ACT inhaler USE 2 PUFFS EVERY 4 HOURS  AS NEEDED FOR WHEEZING. 12.9 g 3   Lancets (ONETOUCH DELICA PLUS [id number]) MISC USE AS DIRECTED TO CHECK BLOOD SUGAR 4 TIMES DAILY AFTER MEALS AND AT BEDTIME. 200 each 4   lisinopril (ZESTRIL) 20 MG tablet TAKE 1 TABLET BY MOUTH DAILY 30 tablet 0   loperamide (IMODIUM) 2 MG capsule Take by mouth as needed for diarrhea or loose stools.     metoprolol tartrate (LOPRESSOR) 25 MG tablet TAKE 1 TABLET BY MOUTH TWICE  DAILY 200 tablet 5   nystatin ointment (MYCOSTATIN) Apply 1 Application topically 2 (two) times daily.     omeprazole (PRILOSEC) 20 MG capsule Take 20 mg by mouth daily as needed.     ONETOUCH VERIO test strip CHECK BLOOD SUGAR 4 TIMES A DAY. 200 strip 5   rosuvastatin (CRESTOR) 20 MG tablet TAKE 1 TABLET BY MOUTH DAILY 30 tablet 0   Simethicone 125 MG CAPS Take 1 capsule by mouth daily as needed.     SYNTHROID 75 MCG tablet TAKE 1 TABLET DAILY BEFORE BREAKFAST FOR HYPOTHYROIDISM 90 tablet 1   TOUJEO SOLOSTAR 300 UNIT/ML Solostar Pen INJECT SUBCUTANEOUSLY 50 UNITS  DAILY 18 mL 1   zaleplon (  SONATA) 10 MG capsule Take 10 mg by mouth at bedtime as needed for sleep.     UNIFINE PENTIPS PLUS 33G X 4 MM MISC USE FOUR TIMES DAILY( AFTER MEALS AND AT BEDTIME) 300 each 3   calcium carbonate (TUMS - DOSED IN MG ELEMENTAL CALCIUM) 500 MG chewable tablet Chew 1 tablet by mouth daily as needed for indigestion or heartburn.     Vilazodone HCl (VIIBRYD) 40 MG TABS Take by mouth daily.     No facility-administered medications prior to visit.    ROS Review of Systems  Constitutional:  Positive for unexpected weight change (wt gain). Negative for appetite change, chills, diaphoresis and fatigue.  Eyes: Negative.   Respiratory: Negative.  Negative for cough, chest tightness, shortness of breath and wheezing.   Cardiovascular:  Positive for chest pain. Negative for palpitations and leg swelling.   Gastrointestinal:  Positive for abdominal pain, constipation and diarrhea. Negative for blood in stool, nausea and vomiting.  Genitourinary:  Positive for dysuria, frequency and urgency. Negative for difficulty urinating, flank pain, hematuria and vaginal bleeding.  Musculoskeletal: Negative.  Negative for arthralgias and myalgias.  Skin: Negative.   Neurological: Negative.  Negative for dizziness and weakness.  Hematological:  Negative for adenopathy. Does not bruise/bleed easily.  Psychiatric/Behavioral: Negative.      Objective:  BP 134/78 (BP Location: Left Arm, Patient Position: Sitting, Cuff Size: Large)   Pulse 70   Temp 98.4 F (36.9 C) (Oral)   Resp 16   Ht '5\' 1"'$  (1.549 m)   Wt 254 lb (115.2 kg)   LMP 12/31/1993   SpO2 94%   BMI 47.99 kg/m   BP Readings from Last 3 Encounters:  01/31/23 134/78  07/30/22 (!) 130/58  04/05/22 132/80    Wt Readings from Last 3 Encounters:  01/31/23 254 lb (115.2 kg)  07/30/22 246 lb (111.6 kg)  04/05/22 231 lb (104.8 kg)    Physical Exam Vitals reviewed. Exam conducted with a chaperone present (Shirron).  Constitutional:      Appearance: Normal appearance. She is obese.  HENT:     Mouth/Throat:     Mouth: Mucous membranes are moist.  Eyes:     General: No scleral icterus.    Conjunctiva/sclera: Conjunctivae normal.  Cardiovascular:     Rate and Rhythm: Normal rate and regular rhythm.     Heart sounds: No murmur heard.    No friction rub. No gallop.     Comments: EKG- ++++artifact SR, 63 bpm LAD, low voltage c/w body Antero/lateral/infract pattern is not new Pulmonary:     Effort: Pulmonary effort is normal.     Breath sounds: No stridor. No wheezing, rhonchi or rales.  Abdominal:     General: Abdomen is protuberant. Bowel sounds are normal. There is no distension.     Palpations: Abdomen is soft. There is no hepatomegaly, splenomegaly or mass.     Tenderness: There is generalized abdominal tenderness. There is no  guarding or rebound.     Hernia: No hernia is present.  Genitourinary:    Rectum: Normal. Guaiac result negative. No mass, tenderness, anal fissure, external hemorrhoid or internal hemorrhoid. Normal anal tone.  Musculoskeletal:        General: Normal range of motion.     Cervical back: Neck supple.     Right lower leg: No edema.     Left lower leg: No edema.  Lymphadenopathy:     Cervical: No cervical adenopathy.  Skin:    General: Skin is  warm and dry.  Neurological:     General: No focal deficit present.     Mental Status: She is alert.  Psychiatric:        Mood and Affect: Mood normal.        Behavior: Behavior normal.        Thought Content: Thought content normal.        Judgment: Judgment normal.     Lab Results  Component Value Date   WBC 13.4 (H) 01/31/2023   HGB 12.6 01/31/2023   HCT 38.2 01/31/2023   PLT 198.0 01/31/2023   GLUCOSE 55 (L) 01/31/2023   CHOL 145 07/30/2022   TRIG 180.0 (H) 01/31/2023   HDL 46.70 07/30/2022   LDLDIRECT 70.0 07/30/2022   LDLCALC 60 02/10/2020   ALT 11 01/31/2023   AST 18 01/31/2023   NA 139 01/31/2023   K 4.7 01/31/2023   CL 103 01/31/2023   CREATININE 1.47 (H) 01/31/2023   BUN 25 (H) 01/31/2023   CO2 28 01/31/2023   TSH 0.72 01/31/2023   INR 1.1 11/29/2021   HGBA1C 6.9 (H) 01/31/2023   MICROALBUR <0.7 07/30/2022    CT ANGIO GI BLEED  Result Date: 11/29/2021 CLINICAL DATA:  Bright red blood in stool, emesis EXAM: CTA ABDOMEN AND PELVIS WITHOUT AND WITH CONTRAST TECHNIQUE: Multidetector CT imaging of the abdomen and pelvis was performed using the standard protocol during bolus administration of intravenous contrast. Multiplanar reconstructed images and MIPs were obtained and reviewed to evaluate the vascular anatomy. CONTRAST:  147m OMNIPAQUE IOHEXOL 350 MG/ML SOLN COMPARISON:  CT abdomen/pelvis 05/25/2020 FINDINGS: VASCULAR Aorta: Normal caliber aorta without aneurysm, dissection, vasculitis or significant stenosis. There  is mild scattered calcified atherosclerotic plaque. Celiac: Patent without evidence of aneurysm, dissection, vasculitis or significant stenosis. SMA: Patent without evidence of aneurysm, dissection, vasculitis or significant stenosis. Renals: There is mild calcified atherosclerotic plaque at the origins of the renal arteries without significant stenosis. There is no evidence of aneurysm, dissection, vasculitis, or fibromuscular dysplasia. IMA: Patent without evidence of aneurysm, dissection, vasculitis or significant stenosis. Inflow: Patent without evidence of aneurysm, dissection, vasculitis or significant stenosis. Proximal Outflow: Bilateral common femoral and visualized portions of the superficial and profunda femoral arteries are patent without evidence of aneurysm, dissection, vasculitis or significant stenosis. Veins: The main portal and splenic veins are patent on the venous phase images. The IVC is patent. There is no evidence of active GI bleed. Review of the MIP images confirms the above findings. NON-VASCULAR Lower chest: The lung bases are clear. The imaged heart is unremarkable. Hepatobiliary: The liver and gallbladder are unremarkable. There is no biliary ductal dilatation. Pancreas: There is fatty atrophy of the pancreatic head. There are no focal lesions or contour abnormalities. There is no main pancreatic ductal dilatation or peripancreatic inflammatory change. Spleen: Unremarkable. Adrenals/Urinary Tract: The adrenals are unremarkable. A small hypodense lesion in the right upper pole most likely reflects a small cyst. The kidneys are otherwise unremarkable, with no other focal lesion, stone, hydronephrosis, or hydroureter. The bladder is decompressed but grossly unremarkable. Stomach/Bowel: Stomach is unremarkable. There is no evidence of bowel obstruction. There is marked wall thickening involving the descending and sigmoid colon with significant surrounding inflammatory change and mucosal  hyperemia consistent with colitis. The remainder of the bowel is normal in appearance. Lymphatic: There is no abdominal or pelvic lymphadenopathy. Reproductive: The uterus is surgically absent. There is no adnexal mass. Other: There is trace free fluid in the left pericolic gutter, likely reactive. There  is no ascites elsewhere. There is no free intraperitoneal air. Musculoskeletal: There is no acute osseous abnormality or aggressive osseous lesion. IMPRESSION: 1. No evidence of active GI bleed. 2. Marked wall thickening and surrounding inflammatory change involving the descending and sigmoid colon consistent with infectious or inflammatory colitis. Recommend follow-up CT abdomen/pelvis after resolution of symptoms to exclude underlying mass lesion. Aortic Atherosclerosis (ICD10-I70.0). Electronically Signed   By: Valetta Mole M.D.   On: 11/29/2021 10:24    Assessment & Plan:   Meredith Hayden was seen today for annual exam and abdominal pain.  Diagnoses and all orders for this visit:  Essential hypertension- Her blood pressure is adequately well-controlled. -     EKG 12-Lead -     Basic metabolic panel; Future -     CBC with Differential/Platelet; Future -     Urinalysis, Routine w reflex microscopic; Future -     Hepatic function panel; Future -     TSH; Future -     TSH -     Hepatic function panel -     Urinalysis, Routine w reflex microscopic -     CBC with Differential/Platelet -     Basic metabolic panel  Acquired hypothyroidism- She is euthyroid. -     TSH; Future -     TSH  Insulin-requiring or dependent type II diabetes mellitus ([place])- Her blood sugar is adequately well-controlled. -     Insulin Pen Needle (UNIFINE PENTIPS PLUS) 33G X 4 MM MISC; Inject 1 Act into the skin 4 (four) times daily. -     Hemoglobin A1c; Future -     Hemoglobin A1c -     HM Diabetes Foot Exam  Stage 3a chronic kidney disease ([place])- Her renal function has improved slightly.  Will continue [place]. -      Basic metabolic panel; Future -     Urinalysis, Routine w reflex microscopic; Future -     Urinalysis, Routine w reflex microscopic -     Basic metabolic panel  Hyperlipidemia LDL goal <130- LDL goal achieved. Doing well on the statin  -     Hepatic function panel; Future -     Hepatic function panel  Pure hypertriglyceridemia -     Hepatic function panel; Future -     Triglycerides; Future -     Triglycerides -     Hepatic function panel  Flu vaccine need -     Flu Vaccine QUAD High Dose(Fluad)  Dysuria- UA is consistent with acute cystitis.  Will start nitrofurantoin pending urine culture results. -     Urinalysis, Routine w reflex microscopic; Future -     CULTURE, URINE COMPREHENSIVE; Future -     CULTURE, URINE COMPREHENSIVE -     Urinalysis, Routine w reflex microscopic  Generalized abdominal tenderness without rebound tenderness- Exam is not consistent with an acute abdominal process.  Lipase is normal.  This is irritable bowel syndrome. -     Lipase; Future -     Lipase  Visit for screening mammogram -     MM DIGITAL SCREENING BILATERAL; Future  Acute cystitis without hematuria -     nitrofurantoin, macrocrystal-monohydrate, (MACROBID) 100 MG capsule; Take 1 capsule (100 mg total) by mouth 2 (two) times daily for 5 days.  Encounter for general adult medical examination with abnormal findings- Exam completed, labs reviewed, vaccines reviewed and updated, cancer screenings addressed, patient education was given.   I have discontinued Meredith Hayden "Chris"'s calcium carbonate and  Vilazodone HCl. I have also changed her Unifine Pentips Plus. Additionally, I am having her start on nitrofurantoin (macrocrystal-monohydrate). Lastly, I am having her maintain her aspirin, cetirizine, cyanocobalamin, Vitamin D3, ipratropium, OneTouch Delica Plus [id number], OneTouch Verio, G[employer]oke HypoPen 2-Pack, loperamide, Simethicone, omeprazole, fluticasone, FreeStyle Libre 2 Sensor, [employer] 2 Reader, dicyclomine, gabapentin, Synthroid, zaleplon, clobetasol ointment, nystatin ointment, metoprolol tartrate, Toujeo SoloStar, [employer] KwikPen, Kerendia, rosuvastatin, and lisinopril.  Meds ordered this encounter  Medications   Insulin Pen Needle (UNIFINE PENTIPS PLUS) 33G X 4 MM MISC    Sig: Inject 1 Act into the skin 4 (four) times daily.    Dispense:  400 each    Refill:  1   nitrofurantoin, macrocrystal-monohydrate, (MACROBID) 100 MG capsule    Sig: Take 1 capsule (100 mg total) by mouth 2 (two) times daily for 5 days.    Dispense:  10 capsule    Refill:  0     Follow-up: Return in about 3 months (around 05/01/2023).  Scarlette Calico, MD

## 2023-02-04 LAB — CULTURE, URINE COMPREHENSIVE

## 2023-02-05 ENCOUNTER — Encounter: Payer: Self-pay | Admitting: Internal Medicine

## 2023-02-05 DIAGNOSIS — N390 Urinary tract infection, site not specified: Secondary | ICD-10-CM | POA: Insufficient documentation

## 2023-02-05 DIAGNOSIS — B9689 Other specified bacterial agents as the cause of diseases classified elsewhere: Secondary | ICD-10-CM | POA: Insufficient documentation

## 2023-02-05 MED ORDER — LEVOFLOXACIN 500 MG PO TABS: 500.0000 mg | ORAL_TABLET | Freq: Every day | ORAL | 0 refills | Status: DC

## 2023-02-06 ENCOUNTER — Other Ambulatory Visit: Payer: Self-pay | Admitting: Internal Medicine

## 2023-02-06 ENCOUNTER — Encounter: Payer: Self-pay | Admitting: Internal Medicine

## 2023-02-06 DIAGNOSIS — N390 Urinary tract infection, site not specified: Secondary | ICD-10-CM

## 2023-02-06 DIAGNOSIS — N3000 Acute cystitis without hematuria: Secondary | ICD-10-CM

## 2023-02-06 MED ORDER — LEVOFLOXACIN 500 MG PO TABS: 500.0000 mg | ORAL_TABLET | Freq: Every day | ORAL | 0 refills | Status: AC

## 2023-02-10 ENCOUNTER — Other Ambulatory Visit: Payer: Self-pay | Admitting: Internal Medicine

## 2023-02-10 DIAGNOSIS — E785 Hyperlipidemia, unspecified: Secondary | ICD-10-CM

## 2023-02-10 DIAGNOSIS — E118 Type 2 diabetes mellitus with unspecified complications: Secondary | ICD-10-CM | POA: Diagnosis not present

## 2023-02-10 DIAGNOSIS — Z794 Long term (current) use of insulin: Secondary | ICD-10-CM | POA: Diagnosis not present

## 2023-02-10 DIAGNOSIS — E11319 Type 2 diabetes mellitus with unspecified diabetic retinopathy without macular edema: Secondary | ICD-10-CM

## 2023-02-10 DIAGNOSIS — E1122 Type 2 diabetes mellitus with diabetic chronic kidney disease: Secondary | ICD-10-CM | POA: Diagnosis not present

## 2023-02-10 DIAGNOSIS — I10 Essential (primary) hypertension: Secondary | ICD-10-CM

## 2023-02-13 ENCOUNTER — Other Ambulatory Visit: Payer: Self-pay | Admitting: Internal Medicine

## 2023-02-13 DIAGNOSIS — N1831 Chronic kidney disease, stage 3a: Secondary | ICD-10-CM

## 2023-02-13 DIAGNOSIS — E119 Type 2 diabetes mellitus without complications: Secondary | ICD-10-CM

## 2023-02-13 DIAGNOSIS — E11319 Type 2 diabetes mellitus with unspecified diabetic retinopathy without macular edema: Secondary | ICD-10-CM

## 2023-02-19 ENCOUNTER — Ambulatory Visit (INDEPENDENT_AMBULATORY_CARE_PROVIDER_SITE_OTHER): Payer: Medicare Other

## 2023-02-19 VITALS — Ht 61.0 in | Wt 254.0 lb

## 2023-02-19 DIAGNOSIS — Z1211 Encounter for screening for malignant neoplasm of colon: Secondary | ICD-10-CM

## 2023-02-19 DIAGNOSIS — Z0000 Encounter for general adult medical examination without abnormal findings: Secondary | ICD-10-CM

## 2023-02-19 NOTE — Progress Notes (Signed)
I connected with  Meredith Hayden on 02/19/2023 at 3:30 pm EST by telephone and verified that I am speaking with the correct person using two identifiers.  Location: Patient: Home Provider: [place] Persons participating in the virtual visit: [place]   I discussed the limitations, risks, security and privacy concerns of performing an evaluation and management service by telephone and the availability of in person appointments. The patient expressed understanding and agreed to proceed.  Interactive audio and video telecommunications were attempted between this nurse and patient, however failed, due to patient having technical difficulties OR patient did not have access to video capability.  We continued and completed visit with audio only.  Some vital signs may be absent or patient reported.   Sheral Flow, LPN Subjective:   Meredith Hayden is a 73 y.o. female who presents for Medicare Annual (Subsequent) preventive examination.  Patient Medicare AWV questionnaire was completed by the patient on 02/18/2023; I have confirmed that all information answered by patient is correct and no changes since this date.    Review of Systems     Cardiac Risk Factors include: advanced age (>48mn, >>40women);diabetes mellitus;dyslipidemia;family history of premature cardiovascular disease;hypertension;obesity (BMI >30kg/m2);sedentary lifestyle     Objective:    Today's Vitals   02/19/23 1534 02/19/23 1548  Weight: 254 lb (115.2 kg)   Height: 5' 1"$  (1.549 m)   PainSc: 7  7   PainLoc: Back    Body mass index is 47.99 kg/m.     02/19/2023    3:38 PM 01/18/2022    1:45 PM 01/02/2021    2:05 PM 01/07/2019    3:36 PM 01/01/2018   10:51 AM 11/14/2016    9:48 AM 01/25/2015   10:33 PM  Advanced Directives  Does Patient Have a Medical Advance Directive? No No No No No No No  Does patient want to make changes to medical advance directive?     Yes (ED - Information  included in AVS)    Would patient like information on creating a medical advance directive? No - Patient declined No - Patient declined No - Patient declined No - Patient declined   No - patient declined information    Current Medications (verified) Outpatient Encounter Medications as of 02/19/2023  Medication Sig   aspirin 81 MG tablet Take 81 mg by mouth daily.    cetirizine (ZYRTEC) 10 MG tablet Take 10 mg by mouth daily.    Cholecalciferol (VITAMIN D3) 125 MCG (5000 UT) TBDP Take 5,000 Units by mouth daily.   clobetasol ointment (TEMOVATE) 0AB-123456789% Apply 1 Application topically 2 (two) times daily.   Continuous Blood Gluc Receiver (FREESTYLE LIBRE 2 READER) DEVI 1 Act by Does not apply route daily.   Continuous Blood Gluc Sensor (FREESTYLE LIBRE 2 SENSOR) MISC 1 Act by Does not apply route daily.   cyanocobalamin 100 MCG tablet Take 100 mcg by mouth daily.   dicyclomine (BENTYL) 10 MG capsule TAKE 1 CAPSULE BY MOUTH 3 TIMES  DAILY BEFORE MEALS   Finerenone (KERENDIA) 20 MG TABS TAKE 1 TABLET BY MOUTH DAILY   fluticasone (FLONASE) 50 MCG/ACT nasal spray Place 1 spray into both nostrils daily.   gabapentin (NEURONTIN) 600 MG tablet Take 1,200 mg by mouth 2 (two) times daily.   Glucagon (GVOKE HYPOPEN 2-PACK) 1 MG/0.2ML SOAJ Inject 1 Act into the skin daily as needed.   HUMALOG KWIKPEN 100 UNIT/ML KwikPen INJECT SUBCUTANEOUSLY 16 UNITS 3 TIMES DAILY   Insulin Pen Needle (  UNIFINE PENTIPS PLUS) 33G X 4 MM MISC Inject 1 Act into the skin 4 (four) times daily.   ipratropium (ATROVENT HFA) 17 MCG/ACT inhaler USE 2 PUFFS EVERY 4 HOURS  AS NEEDED FOR WHEEZING.   Lancets (ONETOUCH DELICA PLUS 123XX123) MISC USE AS DIRECTED TO CHECK BLOOD SUGAR 4 TIMES DAILY AFTER MEALS AND AT BEDTIME.   lisinopril (ZESTRIL) 20 MG tablet TAKE 1 TABLET BY MOUTH DAILY   loperamide (IMODIUM) 2 MG capsule Take by mouth as needed for diarrhea or loose stools.   metoprolol tartrate (LOPRESSOR) 25 MG tablet TAKE 1 TABLET BY  MOUTH TWICE  DAILY   nystatin ointment (MYCOSTATIN) Apply 1 Application topically 2 (two) times daily.   omeprazole (PRILOSEC) 20 MG capsule Take 20 mg by mouth daily as needed.   ONETOUCH VERIO test strip CHECK BLOOD SUGAR 4 TIMES A DAY.   rosuvastatin (CRESTOR) 20 MG tablet TAKE 1 TABLET BY MOUTH DAILY   Simethicone 125 MG CAPS Take 1 capsule by mouth daily as needed.   SYNTHROID 75 MCG tablet TAKE 1 TABLET DAILY BEFORE BREAKFAST FOR HYPOTHYROIDISM   TOUJEO SOLOSTAR 300 UNIT/ML Solostar Pen INJECT SUBCUTANEOUSLY 50 UNITS  DAILY   zaleplon (SONATA) 10 MG capsule Take 10 mg by mouth at bedtime as needed for sleep.   No facility-administered encounter medications on file as of 02/19/2023.    Allergies (verified) Jardiance [empagliflozin], Oxycodone hcl, Oxycodone-acetaminophen, Sulfamethoxazole, Benzodiazepines, Trulicity [dulaglutide], Codeine phosphate, Erythromycin, Metformin and related, Morphine sulfate, and Tylenol [acetaminophen]   History: Past Medical History:  Diagnosis Date   Allergy    Anxiety    Arthritis    Asthma    Blood transfusion without reported diagnosis 2002   with knee replacement   Depression    Diabetes mellitus    Diabetes mellitus type 2 with retinopathy ([place])    Diverticulitis    Dyspareunia    vaginal dryness   Fibroid    reason for hysterectomy   GERD (gastroesophageal reflux disease)    Heart abnormality    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 [place]   FOOT SURGERY Bilateral    NASAL SINUS SURGERY     OOPHORECTOMY     PARTIAL COLECTOMY      -RSO   ROTATOR CUFF REPAIR Left    TOTAL KNEE ARTHROPLASTY Bilateral 2002, 2004   Family History  Problem Relation Age of Onset   COPD Mother    Diabetes Mother    Heart attack Father 57   Hypertension Father    Heart attack Sister 29   Diabetes Sister    Hypertension Sister    Diabetes Brother    Hypertension Sister    Diabetes Sister    Hyperlipidemia Other        fhx   Hypertension Other        fhx   Cancer Other        ovarian/grandmother   Ovarian cancer Maternal Aunt    Diabetes Maternal Aunt  Stomach cancer Maternal Grandmother    Liver cancer Maternal Grandmother    Social History   Socioeconomic History   Marital status: Married    Spouse name: Juanda Crumble   Number of children: 2   Years of education: 18   Highest education level: Not on file  Occupational History   Occupation: disabled    Comment: orthopedic / psych  Tobacco Use   Smoking status: Never   Smokeless tobacco: Never  Vaping Use   Vaping Use: Never used  Substance and Sexual Activity   Alcohol use: No    Alcohol/week: 0.0 standard drinks of alcohol    Comment: stopped in 2018, was social    Drug use: No   Sexual activity: Not Currently    Partners: Male    Birth control/protection: Surgical    Comment: TAH  Other Topics Concern   Not on file  Social History Narrative   Grew up in [place]. Currently resides in resides in a house with her husband. 4 dogs. Fun: Play with your dogs and be with grandchildren, needle work.    Denies any religious beliefs effecting healthcare.    Social Determinants of Health   Financial Resource Strain: Low Risk  (02/19/2023)   Overall Financial Resource Strain (CARDIA)    Difficulty of Paying Living Expenses: Not hard at all  Food Insecurity: No Food Insecurity (02/19/2023)   Hunger Vital Sign    Worried About Running Out of Food in the Last Year: Never true    Ran Out of Food in the Last Year: Never true  Transportation Needs: No Transportation Needs  (02/19/2023)   PRAPARE - Hydrologist (Medical): No    Lack of Transportation (Non-Medical): No  Physical Activity: Inactive (02/19/2023)   Exercise Vital Sign    Days of Exercise per Week: 0 days    Minutes of Exercise per Session: 0 min  Stress: Stress Concern Present (02/19/2023)   [place]    Feeling of Stress : To some extent  Social Connections: Moderately Isolated (02/19/2023)   Social Connection and Isolation Panel [NHANES]    Frequency of Communication with Friends and Family: Three times a week    Frequency of Social Gatherings with Friends and Family: Patient refused    Attends Religious Services: Never    Marine scientist or Organizations: No    Attends Music therapist: Patient refused    Marital Status: Married    Tobacco Counseling Counseling given: Not Answered   Clinical Intake:  Pre-visit preparation completed: Yes  Pain : 0-10 Pain Score: 7  Pain Type: Chronic pain Pain Location: Back Pain Orientation: Lower Pain Relieving Factors: Aleve  Pain Relieving Factors: Aleve  BMI - recorded: 47.99 Nutritional Status: BMI > 30  Obese Nutritional Risks: None Diabetes: Yes  How often do you need to have someone help you when you read instructions, pamphlets, or other written materials from your doctor or pharmacy?: 1 - Never What is the last grade level you completed in school?: HSG  Nutrition Risk Assessment:  Has the patient had any N/V/D within the last 2 months?  Yes  Does the patient have any non-healing wounds?  No  Has the patient had any unintentional weight loss or weight gain?  No   Diabetes:  Is the patient diabetic?  Yes  If diabetic, was a CBG obtained today?  No  Did the patient bring in their glucometer  from home?  No  How often do you monitor your CBG's? Continuous Blood Glucose Monitor.   Financial Strains and Diabetes  Management:  Are you having any financial strains with the device, your supplies or your medication? No .  Does the patient want to be seen by Chronic Care Management for management of their diabetes?  No  Would the patient like to be referred to a Nutritionist or for Diabetic Management?  No   Diabetic Exams:  Diabetic Eye Exam: Completed 05/15/2022 Diabetic Foot Exam: Completed 01/31/2023   Interpreter Needed?: No  Information entered by :: Rakayla Ricklefs N. Marinda Tyer, LPN.   Activities of Daily Living    02/19/2023    3:56 PM 02/18/2023    5:41 PM  In your present state of health, do you have any difficulty performing the following activities:  Hearing? 0 0  Vision? 1 1  Difficulty concentrating or making decisions? 0 0  Walking or climbing stairs? 1 1  Dressing or bathing? 0 0  Doing errands, shopping? 1 1  Preparing Food and eating ? N N  Using the Toilet? N N  In the past six months, have you accidently leaked urine? Y Y  Do you have problems with loss of bowel control? N N  Managing your Medications? N N  Managing your Finances? N N  Housekeeping or managing your Housekeeping? Tempie Donning    Patient Care Team: Janith Lima, MD as PCP - General (Internal Medicine) Nunzio Cobbs, MD as Consulting Physician (Obstetrics and Gynecology) Irene Shipper, MD as Consulting Physician (Gastroenterology) Chucky May, MD as Consulting Physician (Psychiatry) Webb Laws, [place] as Referring Physician (Optometry) Szabat, Darnelle Maffucci, [facility] (Inactive) as Pharmacist (Pharmacist)  Indicate any recent Medical Services you may have received from other than [facility] providers in the past year (date may be approximate).     Assessment:   This is a routine wellness examination for [place].  Hearing/Vision screen Hearing Screening - Comments:: Denies hearing difficulties   Vision Screening - Comments:: Wears rx glasses - up to date with routine eye exams with Webb Laws,  OD.   Dietary issues and exercise activities discussed: Current Exercise Habits: The patient does not participate in regular exercise at present, Exercise limited by: orthopedic condition(s)   Goals Addressed             This Visit's Progress    Client will verbalize knowledge of diabetes self-management as evidenced by Hgb A1C <7 or as defined by provider.            Depression Screen    02/19/2023    3:40 PM 01/18/2022    1:47 PM 08/21/2021    3:25 PM 01/02/2021    2:25 PM 05/23/2020    3:28 PM 02/10/2020   11:32 AM 07/28/2019    4:08 PM  PHQ 2/9 Scores  PHQ - 2 Score 2 0 1 0 2 2 2  $ PHQ- 9 Score 7    9 5 7    $ Fall Risk    02/19/2023    3:55 PM 02/18/2023    5:41 PM 02/01/2023    9:57 AM 01/31/2023    3:19 PM 01/18/2022    1:46 PM  Fall Risk   Falls in the past year? 1 1 1 1 $ 0  Number falls in past yr: 1 1 1 $ 0 0  Injury with Fall? 0 0 0 0 0  Risk for fall due to : Impaired balance/gait   Other (  Comment) No Fall Risks  Follow up Falls evaluation completed   Falls evaluation completed Falls evaluation completed    [place]:  Any stairs in or around the home? No  If so, are there any without handrails? No  Home free of loose throw rugs in walkways, pet beds, electrical cords, etc? Yes  Adequate lighting in your home to reduce risk of falls? Yes   ASSISTIVE DEVICES UTILIZED TO PREVENT FALLS:  Life alert? No  Use of a cane, walker or w/c? Yes  Grab bars in the bathroom? Yes  Shower chair or bench in shower? Yes  Elevated toilet seat or a handicapped toilet? No   TIMED UP AND GO:  Was the test performed? No . Telephonic Visit  Cognitive Function:    01/01/2018   10:57 AM  MMSE - Mini Mental State Exam  Not completed: Refused        02/19/2023    3:57 PM  6CIT Screen  What Year? 0 points  What month? 0 points  What time? 0 points  Count back from 20 0 points  Months in reverse 0 points  Repeat phrase 0 points  Total Score  0 points    Immunizations Immunization History  Administered Date(s) Administered   Fluad Quad(high Dose 65+) 10/21/2019, 10/13/2020, 12/13/2021, 01/31/2023   Hepatitis A, Adult 03/01/2015   Influenza Split 09/19/2011   Influenza Whole 12/08/2007, 10/07/2009, 10/13/2010   Influenza, High Dose Seasonal PF 10/12/2016, 09/05/2017, 11/25/2018   Influenza,inj,Quad PF,6+ Mos 12/09/2013, 09/30/2015   Influenza-Unspecified 11/26/2014   Pneumococcal Conjugate-13 01/01/2018   Pneumococcal Polysaccharide-23 09/19/2011, 01/07/2019   Td 09/19/2007   Tdap 05/19/2018   Zoster Recombinat (Shingrix) 04/05/2022   Zoster, Live 07/07/2013    TDAP status: Up to date  Flu Vaccine status: Up to date  Pneumococcal vaccine status: Up to date  Covid-19 vaccine status: Declined, Education has been provided regarding the importance of this vaccine but patient still declined. Advised may receive this vaccine at local pharmacy or Health Dept.or vaccine clinic. Aware to provide a copy of the vaccination record if obtained from local pharmacy or Health Dept. Verbalized acceptance and understanding.  Qualifies for Shingles Vaccine? Yes   Zostavax completed Yes   Shingrix Completed?: Yes  Screening Tests Health Maintenance  Topic Date Due   Zoster Vaccines- Shingrix (2 of 2) 05/31/2022   MAMMOGRAM  04/06/2023 (Originally 06/04/2020)   OPHTHALMOLOGY EXAM  05/16/2023   Diabetic kidney evaluation - Urine ACR  07/31/2023   HEMOGLOBIN A1C  08/01/2023   Diabetic kidney evaluation - eGFR measurement  02/01/2024   FOOT EXAM  02/01/2024   Medicare Annual Wellness (AWV)  02/20/2024   COLONOSCOPY (Pts 45-72yr Insurance coverage will need to be confirmed)  11/14/2026   DTaP/Tdap/Td (3 - Td or Tdap) 05/19/2028   Pneumonia Vaccine 73 Years old  Completed   INFLUENZA VACCINE  Completed   DEXA SCAN  Completed   Hepatitis C Screening  Completed   HPV VACCINES  Aged Out   COVID-19 Vaccine  Discontinued    Health  Maintenance  Health Maintenance Due  Topic Date Due   Zoster Vaccines- Shingrix (2 of 2) 05/31/2022    Colorectal cancer screening: Referral to GI placed 02/19/2023. Pt aware the office will call re: appt.  Mammogram status: Completed or scheduled for 04/03/2023. Repeat every year  Bone Density status: Completed 03/09/2015. Results reflect: Bone density results: OSTEOPENIA. Repeat every 2-3 years.  Lung Cancer Screening: (Low Dose  CT Chest recommended if Age 52-80 years, 30 pack-year currently smoking OR have quit w/in 15years.) does not qualify.   Lung Cancer Screening Referral: no  Additional Screening:  Hepatitis C Screening: does qualify; Completed 10/12/2016  Vision Screening: Recommended annual ophthalmology exams for early detection of glaucoma and other disorders of the eye. Is the patient up to date with their annual eye exam?  Yes  Who is the provider or what is the name of the office in which the patient attends annual eye exams? Howard McFarland, OD. If pt is not established with a provider, would they like to be referred to a provider to establish care? No .   Dental Screening: Recommended annual dental exams for proper oral hygiene  Community Resource Referral / Chronic Care Management: CRR required this visit?  No   CCM required this visit?  No .     Plan:     I have personally reviewed and noted the following in the patient's chart:   Medical and social history Use of alcohol, tobacco or illicit drugs  Current medications and supplements including opioid prescriptions. Patient is not currently taking opioid prescriptions. Functional ability and status Nutritional status Physical activity Advanced directives List of other physicians Hospitalizations, surgeries, and ER visits in previous 12 months Vitals Screenings to include cognitive, depression, and falls Referrals and appointments  In addition, I have reviewed and discussed with patient certain  preventive protocols, quality metrics, and best practice recommendations. A written personalized care plan for preventive services as well as general preventive health recommendations were provided to patient.     Sheral Flow, LPN   [id number]   Nurse Notes:  Normal cognitive status assessed by direct observation by this Nurse Health Advisor. No abnormalities found.   Patient Medicare AWV questionnaire was completed by the patient on 02/18/2023; I have confirmed that all information answered by patient is correct and no changes since this date.

## 2023-02-19 NOTE — Patient Instructions (Signed)
Meredith Hayden , Thank you for taking time to come for your Medicare Wellness Visit. I appreciate your ongoing commitment to your health goals. Please review the following plan we discussed and let me know if I can assist you in the future.   These are the goals we discussed:  Goals      Client will verbalize knowledge of diabetes self-management as evidenced by Hgb A1C <7 or as defined by provider.            This is a list of the screening recommended for you and due dates:  Health Maintenance  Topic Date Due   Zoster (Shingles) Vaccine (2 of 2) 05/31/2022   Mammogram  04/06/2023*   Eye exam for diabetics  05/16/2023   Yearly kidney health urinalysis for diabetes  07/31/2023   Hemoglobin A1C  08/01/2023   Yearly kidney function blood test for diabetes  02/01/2024   Complete foot exam   02/01/2024   Medicare Annual Wellness Visit  02/20/2024   Colon Cancer Screening  11/14/2026   DTaP/Tdap/Td vaccine (3 - Td or Tdap) 05/19/2028   Pneumonia Vaccine  Completed   Flu Shot  Completed   DEXA scan (bone density measurement)  Completed   Hepatitis C Screening: USPSTF Recommendation to screen - Ages 67-79 yo.  Completed   HPV Vaccine  Aged Out   COVID-19 Vaccine  Discontinued  *Topic was postponed. The date shown is not the original due date.    Advanced directives: No; Advance directive discussed with you today. Even though you declined this today please call our office should you change your mind and we can give you the proper paperwork for you to fill out.  Conditions/risks identified: Yes; Type II Diabetes; Frequent Falls  Next appointment: Follow up in one year for your annual wellness visit.   Preventive Care 71 Years and Older, Female Preventive care refers to lifestyle choices and visits with your health care provider that can promote health and wellness. What does preventive care include? A yearly physical exam. This is also called an annual well check. Dental exams once or  twice a year. Routine eye exams. Ask your health care provider how often you should have your eyes checked. Personal lifestyle choices, including: Daily care of your teeth and gums. Regular physical activity. Eating a healthy diet. Avoiding tobacco and drug use. Limiting alcohol use. Practicing safe sex. Taking low-dose aspirin every day. Taking vitamin and mineral supplements as recommended by your health care provider. What happens during an annual well check? The services and screenings done by your health care provider during your annual well check will depend on your age, overall health, lifestyle risk factors, and family history of disease. Counseling  Your health care provider may ask you questions about your: Alcohol use. Tobacco use. Drug use. Emotional well-being. Home and relationship well-being. Sexual activity. Eating habits. History of falls. Memory and ability to understand (cognition). Work and work Statistician. Reproductive health. Screening  You may have the following tests or measurements: Height, weight, and BMI. Blood pressure. Lipid and cholesterol levels. These may be checked every 5 years, or more frequently if you are over 55 years old. Skin check. Lung cancer screening. You may have this screening every year starting at age 49 if you have a 30-pack-year history of smoking and currently smoke or have quit within the past 15 years. Fecal occult blood test (FOBT) of the stool. You may have this test every year starting at age 74. Flexible  sigmoidoscopy or colonoscopy. You may have a sigmoidoscopy every 5 years or a colonoscopy every 10 years starting at age 8. Hepatitis C blood test. Hepatitis B blood test. Sexually transmitted disease (STD) testing. Diabetes screening. This is done by checking your blood sugar (glucose) after you have not eaten for a while (fasting). You may have this done every 1-3 years. Bone density scan. This is done to screen for  osteoporosis. You may have this done starting at age 89. Mammogram. This may be done every 1-2 years. Talk to your health care provider about how often you should have regular mammograms. Talk with your health care provider about your test results, treatment options, and if necessary, the need for more tests. Vaccines  Your health care provider may recommend certain vaccines, such as: Influenza vaccine. This is recommended every year. Tetanus, diphtheria, and acellular pertussis (Tdap, Td) vaccine. You may need a Td booster every 10 years. Zoster vaccine. You may need this after age 44. Pneumococcal 13-valent conjugate (PCV13) vaccine. One dose is recommended after age 54. Pneumococcal polysaccharide (PPSV23) vaccine. One dose is recommended after age 30. Talk to your health care provider about which screenings and vaccines you need and how often you need them. This information is not intended to replace advice given to you by your health care provider. Make sure you discuss any questions you have with your health care provider. Document Released: 01/13/2016 Document Revised: 09/05/2016 Document Reviewed: 10/18/2015 Elsevier Interactive Patient Education  2017 [employer] Prevention in the Home Falls can cause injuries. They can happen to people of all ages. There are many things you can do to make your home safe and to help prevent falls. What can I do on the outside of my home? Regularly fix the edges of walkways and driveways and fix any cracks. Remove anything that might make you trip as you walk through a door, such as a raised step or threshold. Trim any bushes or trees on the path to your home. Use bright outdoor lighting. Clear any walking paths of anything that might make someone trip, such as rocks or tools. Regularly check to see if handrails are loose or broken. Make sure that both sides of any steps have handrails. Any raised decks and porches should have guardrails on  the edges. Have any leaves, snow, or ice cleared regularly. Use sand or salt on walking paths during winter. Clean up any spills in your garage right away. This includes oil or grease spills. What can I do in the bathroom? Use night lights. Install grab bars by the toilet and in the tub and shower. Do not use towel bars as grab bars. Use non-skid mats or decals in the tub or shower. If you need to sit down in the shower, use a plastic, non-slip stool. Keep the floor dry. Clean up any water that spills on the floor as soon as it happens. Remove soap buildup in the tub or shower regularly. Attach bath mats securely with double-sided non-slip rug tape. Do not have throw rugs and other things on the floor that can make you trip. What can I do in the bedroom? Use night lights. Make sure that you have a light by your bed that is easy to reach. Do not use any sheets or blankets that are too big for your bed. They should not hang down onto the floor. Have a firm chair that has side arms. You can use this for support while you get dressed.  Do not have throw rugs and other things on the floor that can make you trip. What can I do in the kitchen? Clean up any spills right away. Avoid walking on wet floors. Keep items that you use a lot in easy-to-reach places. If you need to reach something above you, use a strong step stool that has a grab bar. Keep electrical cords out of the way. Do not use floor polish or wax that makes floors slippery. If you must use wax, use non-skid floor wax. Do not have throw rugs and other things on the floor that can make you trip. What can I do with my stairs? Do not leave any items on the stairs. Make sure that there are handrails on both sides of the stairs and use them. Fix handrails that are broken or loose. Make sure that handrails are as long as the stairways. Check any carpeting to make sure that it is firmly attached to the stairs. Fix any carpet that is loose  or worn. Avoid having throw rugs at the top or bottom of the stairs. If you do have throw rugs, attach them to the floor with carpet tape. Make sure that you have a light switch at the top of the stairs and the bottom of the stairs. If you do not have them, ask someone to add them for you. What else can I do to help prevent falls? Wear shoes that: Do not have high heels. Have rubber bottoms. Are comfortable and fit you well. Are closed at the toe. Do not wear sandals. If you use a stepladder: Make sure that it is fully opened. Do not climb a closed stepladder. Make sure that both sides of the stepladder are locked into place. Ask someone to hold it for you, if possible. Clearly mark and make sure that you can see: Any grab bars or handrails. First and last steps. Where the edge of each step is. Use tools that help you move around (mobility aids) if they are needed. These include: Canes. Walkers. Scooters. Crutches. Turn on the lights when you go into a dark area. Replace any light bulbs as soon as they burn out. Set up your furniture so you have a clear path. Avoid moving your furniture around. If any of your floors are uneven, fix them. If there are any pets around you, be aware of where they are. Review your medicines with your doctor. Some medicines can make you feel dizzy. This can increase your chance of falling. Ask your doctor what other things that you can do to help prevent falls. This information is not intended to replace advice given to you by your health care provider. Make sure you discuss any questions you have with your health care provider. Document Released: 10/13/2009 Document Revised: 05/24/2016 Document Reviewed: 01/21/2015 Elsevier Interactive Patient Education  2017 [employer].

## 2023-03-06 DIAGNOSIS — E1122 Type 2 diabetes mellitus with diabetic chronic kidney disease: Secondary | ICD-10-CM | POA: Diagnosis not present

## 2023-03-06 DIAGNOSIS — E118 Type 2 diabetes mellitus with unspecified complications: Secondary | ICD-10-CM | POA: Diagnosis not present

## 2023-03-06 DIAGNOSIS — Z794 Long term (current) use of insulin: Secondary | ICD-10-CM | POA: Diagnosis not present

## 2023-03-13 ENCOUNTER — Telehealth: Payer: Self-pay | Admitting: Internal Medicine

## 2023-03-13 NOTE — Telephone Encounter (Signed)
Faxing 01/31/23 clinical notes to [phone] per [facility] rep. Its takes 48hrs to load on their end.

## 2023-03-13 NOTE — Telephone Encounter (Signed)
[facility] is requesting clinical notes from appointment on 01/31/23. They are needed for billing insurance for the patient's Freestyle Libre 2. Spoke with Legrand Como and he said best callback number is [phone], extension [phone]

## 2023-03-26 ENCOUNTER — Other Ambulatory Visit: Payer: Self-pay | Admitting: Internal Medicine

## 2023-03-26 DIAGNOSIS — E039 Hypothyroidism, unspecified: Secondary | ICD-10-CM

## 2023-04-03 ENCOUNTER — Ambulatory Visit: Payer: Medicare Other

## 2023-04-05 DIAGNOSIS — E1122 Type 2 diabetes mellitus with diabetic chronic kidney disease: Secondary | ICD-10-CM | POA: Diagnosis not present

## 2023-04-05 DIAGNOSIS — E118 Type 2 diabetes mellitus with unspecified complications: Secondary | ICD-10-CM | POA: Diagnosis not present

## 2023-04-05 DIAGNOSIS — Z794 Long term (current) use of insulin: Secondary | ICD-10-CM | POA: Diagnosis not present

## 2023-04-07 ENCOUNTER — Encounter: Payer: Self-pay | Admitting: Internal Medicine

## 2023-04-09 ENCOUNTER — Encounter: Payer: Self-pay | Admitting: Internal Medicine

## 2023-04-09 ENCOUNTER — Ambulatory Visit (INDEPENDENT_AMBULATORY_CARE_PROVIDER_SITE_OTHER): Payer: Medicare Other | Admitting: Internal Medicine

## 2023-04-09 VITALS — BP 118/78 | HR 58 | Temp 98.2°F | Ht 61.0 in | Wt 250.0 lb

## 2023-04-09 DIAGNOSIS — I471 Supraventricular tachycardia, unspecified: Secondary | ICD-10-CM | POA: Diagnosis not present

## 2023-04-09 DIAGNOSIS — R001 Bradycardia, unspecified: Secondary | ICD-10-CM | POA: Diagnosis not present

## 2023-04-09 NOTE — Progress Notes (Unsigned)
Subjective:  Patient ID: Meredith Hayden, female    DOB: 09/12/1950  Age: 73 y.o. MRN: [id number]  CC: No chief complaint on file.   HPI Meredith Hayden presents for f/up ---  She wears an Apple watch.  Over the last month she has been awakened during her sleep with warnings that her heart rate is into the 40s.  She is asymptomatic with this.  She is active during the day and the heart monitor tells her her pulse goes up to 80-100.  She denies dizziness, lightheadedness, syncope, near syncope, chest pain, shortness of breath, diaphoresis, or edema.  Outpatient Medications Prior to Visit  Medication Sig Dispense Refill   aspirin 81 MG tablet Take 81 mg by mouth daily.      cetirizine (ZYRTEC) 10 MG tablet Take 10 mg by mouth daily.      Cholecalciferol (VITAMIN D3) 125 MCG (5000 UT) TBDP Take 5,000 Units by mouth daily.     clobetasol ointment (TEMOVATE) 0.05 % Apply 1 Application topically 2 (two) times daily.     Continuous Blood Gluc Receiver (FREESTYLE LIBRE 2 READER) DEVI 1 Act by Does not apply route daily. 2 each 5   Continuous Blood Gluc Sensor (FREESTYLE LIBRE 2 SENSOR) MISC 1 Act by Does not apply route daily. 2 each 5   cyanocobalamin 100 MCG tablet Take 100 mcg by mouth daily.     dicyclomine (BENTYL) 10 MG capsule TAKE 1 CAPSULE BY MOUTH 3 TIMES  DAILY BEFORE MEALS 270 capsule 0   Finerenone (KERENDIA) 20 MG TABS TAKE 1 TABLET BY MOUTH DAILY 100 tablet 1   fluticasone (FLONASE) 50 MCG/ACT nasal spray Place 1 spray into both nostrils daily.     gabapentin (NEURONTIN) 600 MG tablet Take 1,200 mg by mouth 2 (two) times daily.     Glucagon (GVOKE HYPOPEN 2-PACK) 1 MG/0.2ML SOAJ Inject 1 Act into the skin daily as needed. 2 mL 5   HUMALOG KWIKPEN 100 UNIT/ML KwikPen INJECT SUBCUTANEOUSLY 16 UNITS 3 TIMES DAILY 60 mL 2   Insulin Pen Needle (UNIFINE PENTIPS PLUS) 33G X 4 MM MISC Inject 1 Act into the skin 4 (four) times daily. 400 each 1   ipratropium (ATROVENT HFA) 17  MCG/ACT inhaler USE 2 PUFFS EVERY 4 HOURS  AS NEEDED FOR WHEEZING. 12.9 g 3   Lancets (ONETOUCH DELICA PLUS LANCET33G) MISC USE AS DIRECTED TO CHECK BLOOD SUGAR 4 TIMES DAILY AFTER MEALS AND AT BEDTIME. 200 each 4   lisinopril (ZESTRIL) 20 MG tablet TAKE 1 TABLET BY MOUTH DAILY 30 tablet 3   loperamide (IMODIUM) 2 MG capsule Take by mouth as needed for diarrhea or loose stools.     metoprolol tartrate (LOPRESSOR) 25 MG tablet TAKE 1 TABLET BY MOUTH TWICE  DAILY 200 tablet 5   nystatin ointment (MYCOSTATIN) Apply 1 Application topically 2 (two) times daily.     omeprazole (PRILOSEC) 20 MG capsule Take 20 mg by mouth daily as needed.     ONETOUCH VERIO test strip CHECK BLOOD SUGAR 4 TIMES A DAY. 200 strip 5   rosuvastatin (CRESTOR) 20 MG tablet TAKE 1 TABLET BY MOUTH DAILY 30 tablet 3   Simethicone 125 MG CAPS Take 1 capsule by mouth daily as needed.     SYNTHROID 75 MCG tablet TAKE 1 TABLET DAILY BEFORE BREAKFAST FOR HYPOTHYROIDISM 90 tablet 1   TOUJEO SOLOSTAR 300 UNIT/ML Solostar Pen INJECT SUBCUTANEOUSLY 50 UNITS  DAILY 18 mL 1   zaleplon (SONATA) 10 MG  capsule Take 10 mg by mouth at bedtime as needed for sleep.     No facility-administered medications prior to visit.    ROS Review of Systems  Constitutional: Negative.  Negative for diaphoresis and fatigue.  HENT: Negative.    Eyes: Negative.   Respiratory:  Negative for chest tightness, shortness of breath and wheezing.   Cardiovascular: Negative.  Negative for palpitations and leg swelling.  Gastrointestinal:  Negative for abdominal pain, diarrhea, nausea and vomiting.  Endocrine: Negative.   Genitourinary: Negative.  Negative for difficulty urinating.  Musculoskeletal: Negative.  Negative for back pain.  Skin: Negative.   Neurological:  Negative for dizziness, weakness and headaches.  Hematological:  Negative for adenopathy. Does not bruise/bleed easily.  Psychiatric/Behavioral: Negative.      Objective:  BP 118/78 (BP  Location: Right Arm, Patient Position: Sitting, Cuff Size: Large)   Pulse (!) 58   Temp 98.2 F (36.8 C) (Oral)   Ht 5\' 1"  (1.549 m)   Wt 250 lb (113.4 kg)   LMP 12/31/1993   SpO2 90%   BMI 47.24 kg/m   BP Readings from Last 3 Encounters:  04/09/23 118/78  01/31/23 134/78  07/30/22 (!) 130/58    Wt Readings from Last 3 Encounters:  04/09/23 250 lb (113.4 kg)  02/19/23 254 lb (115.2 kg)  01/31/23 254 lb (115.2 kg)    Physical Exam Vitals reviewed.  Constitutional:      Appearance: She is obese. She is not ill-appearing.  HENT:     Mouth/Throat:     Mouth: Mucous membranes are moist.  Eyes:     General: No scleral icterus.    Conjunctiva/sclera: Conjunctivae normal.  Cardiovascular:     Heart sounds: No murmur heard.    No friction rub. No gallop.  Pulmonary:     Effort: Pulmonary effort is normal.     Breath sounds: No stridor. No wheezing, rhonchi or rales.  Abdominal:     General: Abdomen is protuberant. Bowel sounds are normal. There is no distension.     Palpations: Abdomen is soft. There is no hepatomegaly, splenomegaly or mass.     Tenderness: There is no abdominal tenderness.  Musculoskeletal:        General: Normal range of motion.     Cervical back: Neck supple.     Right lower leg: No edema.     Left lower leg: No edema.  Lymphadenopathy:     Cervical: No cervical adenopathy.  Skin:    General: Skin is warm and dry.  Neurological:     General: No focal deficit present.     Mental Status: She is alert. Mental status is at baseline.     Lab Results  Component Value Date   WBC 13.4 (H) 01/31/2023   HGB 12.6 01/31/2023   HCT 38.2 01/31/2023   PLT 198.0 01/31/2023   GLUCOSE 55 (L) 01/31/2023   CHOL 145 07/30/2022   TRIG 180.0 (H) 01/31/2023   HDL 46.70 07/30/2022   LDLDIRECT 70.0 07/30/2022   LDLCALC 60 02/10/2020   ALT 11 01/31/2023   AST 18 01/31/2023   NA 139 01/31/2023   K 4.7 01/31/2023   CL 103 01/31/2023   CREATININE 1.47 (H)  01/31/2023   BUN 25 (H) 01/31/2023   CO2 28 01/31/2023   TSH 0.72 01/31/2023   INR 1.1 11/29/2021   HGBA1C 6.9 (H) 01/31/2023   MICROALBUR <0.7 07/30/2022    CT ANGIO GI BLEED  Result Date: 11/29/2021 CLINICAL DATA:  Bright  red blood in stool, emesis EXAM: CTA ABDOMEN AND PELVIS WITHOUT AND WITH CONTRAST TECHNIQUE: Multidetector CT imaging of the abdomen and pelvis was performed using the standard protocol during bolus administration of intravenous contrast. Multiplanar reconstructed images and MIPs were obtained and reviewed to evaluate the vascular anatomy. CONTRAST:  OMNIPAQUE IOHEXOL 350 MG/ML SOLN COMPARISON:  CT abdomen/pelvis 05/25/2020 FINDINGS: VASCULAR Aorta: Normal caliber aorta without aneurysm, dissection, vasculitis or significant stenosis. There is mild scattered calcified atherosclerotic plaque. Celiac: Patent without evidence of aneurysm, dissection, vasculitis or significant stenosis. SMA: Patent without evidence of aneurysm, dissection, vasculitis or significant stenosis. Renals: There is mild calcified atherosclerotic plaque at the origins of the renal arteries without significant stenosis. There is no evidence of aneurysm, dissection, vasculitis, or fibromuscular dysplasia. IMA: Patent without evidence of aneurysm, dissection, vasculitis or significant stenosis. Inflow: Patent without evidence of aneurysm, dissection, vasculitis or significant stenosis. Proximal Outflow: Bilateral common femoral and visualized portions of the superficial and profunda femoral arteries are patent without evidence of aneurysm, dissection, vasculitis or significant stenosis. Veins: The main portal and splenic veins are patent on the venous phase images. The IVC is patent. There is no evidence of active GI bleed. Review of the MIP images confirms the above findings. NON-VASCULAR Lower chest: The lung bases are clear. The imaged heart is unremarkable. Hepatobiliary: The liver and gallbladder are  unremarkable. There is no biliary ductal dilatation. Pancreas: There is fatty atrophy of the pancreatic head. There are no focal lesions or contour abnormalities. There is no main pancreatic ductal dilatation or peripancreatic inflammatory change. Spleen: Unremarkable. Adrenals/Urinary Tract: The adrenals are unremarkable. A small hypodense lesion in the right upper pole most likely reflects a small cyst. The kidneys are otherwise unremarkable, with no other focal lesion, stone, hydronephrosis, or hydroureter. The bladder is decompressed but grossly unremarkable. Stomach/Bowel: Stomach is unremarkable. There is no evidence of bowel obstruction. There is marked wall thickening involving the descending and sigmoid colon with significant surrounding inflammatory change and mucosal hyperemia consistent with colitis. The remainder of the bowel is normal in appearance. Lymphatic: There is no abdominal or pelvic lymphadenopathy. Reproductive: The uterus is surgically absent. There is no adnexal mass. Other: There is trace free fluid in the left pericolic gutter, likely reactive. There is no ascites elsewhere. There is no free intraperitoneal air. Musculoskeletal: There is no acute osseous abnormality or aggressive osseous lesion. IMPRESSION: 1. No evidence of active GI bleed. 2. Marked wall thickening and surrounding inflammatory change involving the descending and sigmoid colon consistent with infectious or inflammatory colitis. Recommend follow-up CT abdomen/pelvis after resolution of symptoms to exclude underlying mass lesion. Aortic Atherosclerosis (ICD10-I70.0). Electronically Signed   By: Lesia Hausen M.D.   On: 11/29/2021 10:24    Assessment & Plan:    Bradycardia- She is asymptomatic with this.  She is not willing to stop the beta-blocker because she has a history of PSVT and prolonged QT.  Her Apple Watch has documented chronotropic competence.  I recommended that she undergo a cardiac event monitor to  evaluate for pauses. -     CARDIAC EVENT MONITOR; Future  PSVT (paroxysmal supraventricular tachycardia)- HR is well controlled. -     CARDIAC EVENT MONITOR; Future     Follow-up: Return in about 3 months (around 07/09/2023).  Sanda Linger, MD

## 2023-04-09 NOTE — Patient Instructions (Signed)
Bradycardia, Adult Bradycardia is a slower-than-normal heartbeat. A normal resting heart rate for an adult ranges from 60 to 100 beats per minute. With bradycardia, the resting heart rate is less than 60 beats per minute. Bradycardia can prevent enough oxygen from reaching certain areas of your body when you are active. It can be serious if it keeps enough oxygen from reaching your brain and other parts of your body. Bradycardia is not a problem for everyone. For some healthy adults, a slow resting heart rate is normal. What are the causes? This condition may be caused by: A problem with the heart, including: A problem with the heart's electrical system, such as a heart block. With a heart block, electrical signals between the chambers of the heart are partially or completely blocked, so they are not able to work as they should. A problem with the heart's natural pacemaker (sinus node). Heart disease. A heart attack. Heart damage. Lyme disease. A heart infection. A heart condition that is present at birth (congenital heart defect). Certain medicines that treat heart conditions. Certain conditions, such as hypothyroidism and obstructive sleep apnea. Problems with the balance of chemicals and other substances, like potassium, in the blood. Trauma. Radiation therapy. What increases the risk? You are more likely to develop this condition if you: Are age 65 or older. Have high blood pressure (hypertension), high cholesterol (hyperlipidemia), or diabetes. Drink heavily, use tobacco or nicotine products, or use drugs. What are the signs or symptoms? Symptoms of this condition include: Light-headedness. Feeling faint or fainting. Fatigue and weakness. Trouble with activity or exercise. Shortness of breath. Chest pain (angina). Drowsiness. Confusion. Dizziness. How is this diagnosed? This condition may be diagnosed based on: Your symptoms. Your medical history. A physical exam. During  the exam, your health care provider will listen to your heartbeat and check your pulse. To confirm the diagnosis, your health care provider may order tests, such as: Blood tests. An electrocardiogram (ECG). This test records the heart's electrical activity. The test can show how fast your heart is beating and whether the heartbeat is steady. A test in which you wear a portable device (event recorder or Holter monitor) to record your heart's electrical activity while you go about your day. An exercise test. How is this treated? Treatment for this condition depends on the cause of the condition and how severe your symptoms are. Treatment may involve: Treatment of the underlying condition. Changing your medicines or how much medicine you take. Having a small, battery-operated device called a pacemaker implanted under the skin. When bradycardia occurs, this device can be used to increase your heart rate and help your heart beat in a regular rhythm. Follow these instructions at home: Lifestyle Manage any health conditions that contribute to bradycardia as told by your health care provider. Follow a heart-healthy diet. A nutrition specialist (dietitian) can help educate you about healthy food options and changes. Follow an exercise program that is approved by your health care provider. Maintain a healthy weight. Try to reduce or manage your stress, such as with yoga or meditation. If you need help reducing stress, ask your health care provider. Do not use any products that contain nicotine or tobacco. These products include cigarettes, chewing tobacco, and vaping devices, such as e-cigarettes. If you need help quitting, ask your health care provider. Do not use illegal drugs. Alcohol use If you drink alcohol: Limit how much you have to: 0-1 drink a day for women who are not pregnant. 0-2 drinks a day   for men. Know how much alcohol is in a drink. In the [place] one drink equals one 12 oz bottle of  beer (355 mL), one 5 oz glass of wine (148 mL), or one 1 oz glass of hard liquor (44 mL). General instructions Take over-the-counter and prescription medicines only as told by your health care provider. Keep all follow-up visits. This is important. How is this prevented? In some cases, bradycardia may be prevented by: Treating underlying medical problems. Stopping behaviors or medicines that can trigger the condition. Contact a health care provider if: You feel light-headed or dizzy. You almost faint. You feel weak or are easily fatigued during physical activity. You experience confusion or have memory problems. Get help right away if: You faint. You have chest pains or an irregular heartbeat (palpitations). You have trouble breathing. These symptoms may represent a serious problem that is an emergency. Do not wait to see if the symptoms will go away. Get medical help right away. Call your local emergency services (911 in the [place] Do not drive yourself to the hospital. Summary Bradycardia is a slower-than-normal heartbeat. With bradycardia, the resting heart rate is less than 60 beats per minute. Treatment for this condition depends on the cause. Manage any health conditions that contribute to bradycardia as told by your health care provider. Do not use any products that contain nicotine or tobacco. These products include cigarettes, chewing tobacco, and vaping devices, such as e-cigarettes. Keep all follow-up visits. This is important. This information is not intended to replace advice given to you by your health care provider. Make sure you discuss any questions you have with your health care provider. Document Revised: 04/09/2021 Document Reviewed: 04/09/2021 Elsevier Patient Education  2023 Elsevier Inc.  

## 2023-04-11 DIAGNOSIS — I471 Supraventricular tachycardia, unspecified: Secondary | ICD-10-CM | POA: Diagnosis not present

## 2023-04-11 DIAGNOSIS — R001 Bradycardia, unspecified: Secondary | ICD-10-CM | POA: Diagnosis not present

## 2023-04-14 ENCOUNTER — Other Ambulatory Visit: Payer: Self-pay | Admitting: Internal Medicine

## 2023-04-14 DIAGNOSIS — E11319 Type 2 diabetes mellitus with unspecified diabetic retinopathy without macular edema: Secondary | ICD-10-CM

## 2023-04-14 DIAGNOSIS — E119 Type 2 diabetes mellitus without complications: Secondary | ICD-10-CM

## 2023-04-17 DIAGNOSIS — H2513 Age-related nuclear cataract, bilateral: Secondary | ICD-10-CM | POA: Diagnosis not present

## 2023-04-17 DIAGNOSIS — E119 Type 2 diabetes mellitus without complications: Secondary | ICD-10-CM | POA: Diagnosis not present

## 2023-04-17 DIAGNOSIS — H524 Presbyopia: Secondary | ICD-10-CM | POA: Diagnosis not present

## 2023-04-17 DIAGNOSIS — H52223 Regular astigmatism, bilateral: Secondary | ICD-10-CM | POA: Diagnosis not present

## 2023-04-17 DIAGNOSIS — Z135 Encounter for screening for eye and ear disorders: Secondary | ICD-10-CM | POA: Diagnosis not present

## 2023-04-23 DIAGNOSIS — N1832 Chronic kidney disease, stage 3b: Secondary | ICD-10-CM | POA: Diagnosis not present

## 2023-04-23 DIAGNOSIS — E1140 Type 2 diabetes mellitus with diabetic neuropathy, unspecified: Secondary | ICD-10-CM | POA: Diagnosis not present

## 2023-04-23 DIAGNOSIS — K760 Fatty (change of) liver, not elsewhere classified: Secondary | ICD-10-CM | POA: Diagnosis not present

## 2023-04-28 ENCOUNTER — Other Ambulatory Visit: Payer: Self-pay | Admitting: Internal Medicine

## 2023-04-28 DIAGNOSIS — E11319 Type 2 diabetes mellitus with unspecified diabetic retinopathy without macular edema: Secondary | ICD-10-CM

## 2023-04-28 DIAGNOSIS — E785 Hyperlipidemia, unspecified: Secondary | ICD-10-CM

## 2023-04-28 DIAGNOSIS — I10 Essential (primary) hypertension: Secondary | ICD-10-CM

## 2023-05-05 DIAGNOSIS — E118 Type 2 diabetes mellitus with unspecified complications: Secondary | ICD-10-CM | POA: Diagnosis not present

## 2023-05-05 DIAGNOSIS — Z794 Long term (current) use of insulin: Secondary | ICD-10-CM | POA: Diagnosis not present

## 2023-05-05 DIAGNOSIS — E1122 Type 2 diabetes mellitus with diabetic chronic kidney disease: Secondary | ICD-10-CM | POA: Diagnosis not present

## 2023-05-14 LAB — HM DIABETES EYE EXAM

## 2023-05-29 DIAGNOSIS — E1122 Type 2 diabetes mellitus with diabetic chronic kidney disease: Secondary | ICD-10-CM | POA: Diagnosis not present

## 2023-05-29 DIAGNOSIS — Z794 Long term (current) use of insulin: Secondary | ICD-10-CM | POA: Diagnosis not present

## 2023-05-29 DIAGNOSIS — E118 Type 2 diabetes mellitus with unspecified complications: Secondary | ICD-10-CM | POA: Diagnosis not present

## 2023-05-30 ENCOUNTER — Ambulatory Visit: Payer: Medicare Other | Attending: Internal Medicine

## 2023-05-30 DIAGNOSIS — R001 Bradycardia, unspecified: Secondary | ICD-10-CM

## 2023-05-30 DIAGNOSIS — I471 Supraventricular tachycardia, unspecified: Secondary | ICD-10-CM

## 2023-06-10 ENCOUNTER — Other Ambulatory Visit: Payer: Self-pay | Admitting: Internal Medicine

## 2023-06-10 ENCOUNTER — Encounter: Payer: Self-pay | Admitting: Internal Medicine

## 2023-06-10 DIAGNOSIS — R001 Bradycardia, unspecified: Secondary | ICD-10-CM

## 2023-06-18 ENCOUNTER — Other Ambulatory Visit: Payer: Self-pay | Admitting: Internal Medicine

## 2023-06-18 ENCOUNTER — Encounter: Payer: Self-pay | Admitting: Internal Medicine

## 2023-06-23 ENCOUNTER — Other Ambulatory Visit: Payer: Self-pay | Admitting: Internal Medicine

## 2023-06-23 DIAGNOSIS — E119 Type 2 diabetes mellitus without complications: Secondary | ICD-10-CM

## 2023-06-23 MED ORDER — LINAGLIPTIN 5 MG PO TABS
5.0000 mg | ORAL_TABLET | Freq: Every day | ORAL | 0 refills | Status: DC
Start: 2023-06-23 — End: 2023-08-24

## 2023-06-28 DIAGNOSIS — E118 Type 2 diabetes mellitus with unspecified complications: Secondary | ICD-10-CM | POA: Diagnosis not present

## 2023-06-28 DIAGNOSIS — Z794 Long term (current) use of insulin: Secondary | ICD-10-CM | POA: Diagnosis not present

## 2023-06-28 DIAGNOSIS — E1122 Type 2 diabetes mellitus with diabetic chronic kidney disease: Secondary | ICD-10-CM | POA: Diagnosis not present

## 2023-07-07 ENCOUNTER — Other Ambulatory Visit: Payer: Self-pay | Admitting: Internal Medicine

## 2023-07-07 DIAGNOSIS — E11319 Type 2 diabetes mellitus with unspecified diabetic retinopathy without macular edema: Secondary | ICD-10-CM

## 2023-07-07 DIAGNOSIS — I10 Essential (primary) hypertension: Secondary | ICD-10-CM

## 2023-07-07 DIAGNOSIS — E785 Hyperlipidemia, unspecified: Secondary | ICD-10-CM

## 2023-07-28 DIAGNOSIS — E118 Type 2 diabetes mellitus with unspecified complications: Secondary | ICD-10-CM | POA: Diagnosis not present

## 2023-07-28 DIAGNOSIS — Z794 Long term (current) use of insulin: Secondary | ICD-10-CM | POA: Diagnosis not present

## 2023-07-28 DIAGNOSIS — E1122 Type 2 diabetes mellitus with diabetic chronic kidney disease: Secondary | ICD-10-CM | POA: Diagnosis not present

## 2023-08-01 ENCOUNTER — Other Ambulatory Visit: Payer: Self-pay | Admitting: Internal Medicine

## 2023-08-01 DIAGNOSIS — E119 Type 2 diabetes mellitus without complications: Secondary | ICD-10-CM

## 2023-08-01 DIAGNOSIS — N1831 Chronic kidney disease, stage 3a: Secondary | ICD-10-CM

## 2023-08-01 DIAGNOSIS — E11319 Type 2 diabetes mellitus with unspecified diabetic retinopathy without macular edema: Secondary | ICD-10-CM

## 2023-08-13 ENCOUNTER — Encounter: Payer: Self-pay | Admitting: Interventional Cardiology

## 2023-08-13 ENCOUNTER — Ambulatory Visit: Payer: Medicare Other | Attending: Interventional Cardiology | Admitting: Interventional Cardiology

## 2023-08-13 VITALS — BP 127/75 | HR 80 | Ht 61.0 in | Wt 250.0 lb

## 2023-08-13 DIAGNOSIS — R001 Bradycardia, unspecified: Secondary | ICD-10-CM | POA: Diagnosis not present

## 2023-08-13 DIAGNOSIS — I471 Supraventricular tachycardia, unspecified: Secondary | ICD-10-CM | POA: Diagnosis not present

## 2023-08-13 DIAGNOSIS — I10 Essential (primary) hypertension: Secondary | ICD-10-CM

## 2023-08-13 MED ORDER — METOPROLOL TARTRATE 25 MG PO TABS: 12.5000 mg | ORAL_TABLET | Freq: Two times a day (BID) | ORAL | 3 refills | Status: DC

## 2023-08-13 NOTE — Progress Notes (Signed)
Cardiology Office Note   Date:  08/13/2023   ID:  Sanantha, Proffer 1950-02-17, MRN [id number]  PCP:  Etta Grandchild, MD    No chief complaint on file.  bradycardia  Wt Readings from Last 3 Encounters:  08/13/23 250 lb (113.4 kg)  04/09/23 250 lb (113.4 kg)  02/19/23 254 lb (115.2 kg)       History of Present Illness: Nixon Rulli is a 73 y.o. female who is being seen today for the evaluation of bradycardia at the request of Etta Grandchild, MD.   Applewatch was waking her up with HR in the 40s.  Taking metoprolol BID, since she had some SVT in 2018.  She cut her back on metoprolol to once a day.  No significant SVT episodes since 2016.  Shortlived palpitations these days.   Echo in 2023 showed normal swallowing mechanism LV/RV/valvular function.    Recent monitor showed: " NSR with sinus brady and sinus tachy 2. PAC's and PVC's. 3. No prolonged pauses 4. Noise artifact is present making it impossible to definitively exclude brief atrial fib. 5. 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 Nicholas Lose   CESAREAN SECTION  1978   FOOT SURGERY Bilateral    NASAL  SINUS SURGERY     OOPHORECTOMY     PARTIAL COLECTOMY     -RSO   ROTATOR CUFF REPAIR Left    TOTAL KNEE ARTHROPLASTY Bilateral 2002, 2004     Current Outpatient Medications  Medication Sig Dispense Refill   aspirin 81 MG tablet Take 81 mg by mouth daily.      cetirizine (ZYRTEC) 10 MG tablet Take 10 mg by mouth daily.      Cholecalciferol (VITAMIN D3) 125 MCG (5000 UT) TBDP Take 5,000 Units by mouth daily.     clobetasol ointment (TEMOVATE) 0.05 % Apply 1 Application topically 2 (two) times daily.     Continuous Blood Gluc Receiver (FREESTYLE LIBRE 2 READER) DEVI 1 Act by Does not apply route daily. 2 each 5   Continuous Blood Gluc Sensor (FREESTYLE LIBRE 2 SENSOR) MISC 1 Act by Does not apply route daily. 2 each 5   dicyclomine (BENTYL) 10 MG capsule TAKE 1 CAPSULE BY MOUTH 3 TIMES  DAILY BEFORE MEALS 270 capsule 0   fluticasone (FLONASE) 50 MCG/ACT nasal spray Place 1 spray into both nostrils daily.     gabapentin (NEURONTIN) 600 MG tablet Take 1,200 mg by mouth 2 (  two) times daily.     Glucagon (GVOKE HYPOPEN 2-PACK) 1 MG/0.2ML SOAJ Inject 1 Act into the skin daily as needed. 2 mL 5   HUMALOG KWIKPEN 100 UNIT/ML KwikPen INJECT SUBCUTANEOUSLY 16 UNITS 3 TIMES DAILY 60 mL 2   Insulin Pen Needle (UNIFINE PENTIPS PLUS) 33G X 4 MM MISC Inject 1 Act into the skin 4 (four) times daily. 400 each 1   ipratropium (ATROVENT HFA) 17 MCG/ACT inhaler USE 2 PUFFS EVERY 4 HOURS  AS NEEDED FOR WHEEZING. 12.9 g 3   KERENDIA 20 MG TABS TAKE 1 TABLET BY MOUTH DAILY 100 tablet 0   Lancets (ONETOUCH DELICA PLUS LANCET33G) MISC USE AS DIRECTED TO CHECK BLOOD SUGAR 4 TIMES DAILY AFTER MEALS AND AT BEDTIME. 200 each 4   linagliptin (TRADJENTA) 5 MG TABS tablet Take 1 tablet (5 mg total) by mouth daily. 90 tablet 0   lisinopril (ZESTRIL) 20 MG tablet TAKE 1 TABLET BY MOUTH DAILY 100 tablet 0   loperamide (IMODIUM) 2 MG capsule Take by mouth as needed for diarrhea or loose stools.     metoprolol tartrate  (LOPRESSOR) 25 MG tablet TAKE 1 TABLET BY MOUTH TWICE  DAILY 200 tablet 5   nystatin ointment (MYCOSTATIN) Apply 1 Application topically 2 (two) times daily.     omeprazole (PRILOSEC) 20 MG capsule Take 20 mg by mouth daily as needed.     ONETOUCH VERIO test strip CHECK BLOOD SUGAR 4 TIMES A DAY. 200 strip 5   rosuvastatin (CRESTOR) 20 MG tablet TAKE 1 TABLET BY MOUTH DAILY 100 tablet 0   Simethicone 125 MG CAPS Take 1 capsule by mouth daily as needed.     SYNTHROID 75 MCG tablet TAKE 1 TABLET DAILY BEFORE BREAKFAST FOR HYPOTHYROIDISM 90 tablet 1   TOUJEO SOLOSTAR 300 UNIT/ML Solostar Pen INJECT SUBCUTANEOUSLY 50 UNITS  DAILY 18 mL 1   zaleplon (SONATA) 10 MG capsule Take 10 mg by mouth at bedtime as needed for sleep.     No current facility-administered medications for this visit.    Allergies:   Jardiance [empagliflozin], Oxycodone hcl, Oxycodone-acetaminophen, Sulfamethoxazole, Benzodiazepines, Trulicity [dulaglutide], Codeine phosphate, Erythromycin, Metformin and related, Morphine sulfate, and Tylenol [acetaminophen]    Social History:  The patient  reports that she has never smoked. She has never used smokeless tobacco. She reports that she does not drink alcohol and does not use drugs.   Family History:  The patient's family history includes COPD in her mother; Cancer in an other family member; Diabetes in her brother, maternal aunt, mother, sister, and sister; Heart attack (age of onset: 41) in her sister; Heart attack (age of onset: 37) in her father; Hyperlipidemia in an other family member; Hypertension in her father, sister, sister, and another family member; Liver cancer in her maternal grandmother; Ovarian cancer in her maternal aunt; Stomach cancer in her maternal grandmother.    ROS:  Please see the history of present illness.   Otherwise, review of systems are positive for dizziness- felt faint on a few occasion when standing; no syncope.   All other systems are reviewed and  negative.    PHYSICAL EXAM: VS:  BP 127/75   Pulse 80   Ht 5\' 1"  (1.549 m)   Wt 250 lb (113.4 kg)   LMP 12/31/1993   SpO2 95%   BMI 47.24 kg/m  , BMI Body mass index is 47.24 kg/m. GEN: Well nourished, well developed, in no acute distress HEENT: normal Neck: no JVD, carotid bruits, or  masses Cardiac: RRR; no murmurs, rubs, or gallops,no edema  Respiratory:  clear to auscultation bilaterally, normal work of breathing GI: soft, nontender, nondistended, + BS MS: no deformity or atrophy Skin: warm and dry, no rash Neuro:  Strength and sensation are intact Psych: euthymic mood, full affect   EKG:   The ekg ordered today demonstrates NSR,  T wave inversion in the lateral leads and V2   Recent Labs: 01/31/2023: ALT 11; BUN 25; Creatinine, Ser 1.47; Hemoglobin 12.6; Platelets 198.0; Potassium 4.7; Sodium 139; TSH 0.72   Lipid Panel    Component Value Date/Time   CHOL 145 07/30/2022 1656   TRIG 180.0 (H) 01/31/2023 1611   HDL 46.70 07/30/2022 1656   CHOLHDL 3 07/30/2022 1656   VLDL 55.6 (H) 07/30/2022 1656   LDLCALC 60 02/10/2020 1204   LDLDIRECT 70.0 07/30/2022 1656     Other studies Reviewed: Additional studies/ records that were reviewed today with results demonstrating: A1C 6.9. LDL 60.   ASSESSMENT AND PLAN:  Bradycardia: noted during sleep by Apple watch.  Stay well-hydrated.  Had SVT and is tolerating 25 mg once a day.  Can change to 12.5 mg BID.  Family history of early coronary artery disease: Consider doing a calcium scoring CT.   already on high-dose statin, the calcium scoring CT may not really change management Obesity: Whole food, plant based diet.  High-fiber diet.  Avoid processed foods.  DM: Well-controlled with A1c of 6.9.   Current medicines are reviewed at length with the patient today.  The patient concerns regarding her medicines were addressed.  The following changes have been made:  No change  Labs/ tests ordered today include:   Orders  Placed This Encounter  Procedures   EKG 12-Lead    Recommend 150 minutes/week of aerobic exercise Low fat, low carb, high fiber diet recommended  Disposition:   FU in 1 year with Dr. Mayford Knife   Signed, Lance Muss, MD  08/13/2023 3:16 PM    [facility] [address] [place], [place], [place]  [address] Phone: [phone]; Fax: [phone]

## 2023-08-13 NOTE — Patient Instructions (Signed)
Medication Instructions:  Your physician has recommended you make the following change in your medication: Decrease metoprolol tartrate to 12.5 mg by mouth twice daily  *If you need a refill on your cardiac medications before your next appointment, please call your pharmacy*   Lab Work: none If you have labs (blood work) drawn today and your tests are completely normal, you will receive your results only by: MyChart Message (if you have MyChart) OR A paper copy in the mail If you have any lab test that is abnormal or we need to change your treatment, we will call you to review the results.   Testing/Procedures: none   Follow-Up: At [facility], you and your health needs are our priority.  As part of our continuing mission to provide you with exceptional heart care, we have created designated Provider Care Teams.  These Care Teams include your primary Cardiologist (physician) and Advanced Practice Providers (APPs -  Physician Assistants and Nurse Practitioners) who all work together to provide you with the care you need, when you need it.  We recommend signing up for the patient portal called "MyChart".  Sign up information is provided on this After Visit Summary.  MyChart is used to connect with patients for Virtual Visits (Telemedicine).  Patients are able to view lab/test results, encounter notes, upcoming appointments, etc.  Non-urgent messages can be sent to your provider as well.   To learn more about what you can do with MyChart, go to [email or web address].    Your next appointment:   12 month(s)  Provider:   Dr Mayford Knife    Other Instructions

## 2023-08-22 ENCOUNTER — Other Ambulatory Visit: Payer: Self-pay | Admitting: Medical Genetics

## 2023-08-22 DIAGNOSIS — Z006 Encounter for examination for normal comparison and control in clinical research program: Secondary | ICD-10-CM

## 2023-08-23 DIAGNOSIS — E118 Type 2 diabetes mellitus with unspecified complications: Secondary | ICD-10-CM | POA: Diagnosis not present

## 2023-08-23 DIAGNOSIS — E1122 Type 2 diabetes mellitus with diabetic chronic kidney disease: Secondary | ICD-10-CM | POA: Diagnosis not present

## 2023-08-23 DIAGNOSIS — Z794 Long term (current) use of insulin: Secondary | ICD-10-CM | POA: Diagnosis not present

## 2023-08-24 ENCOUNTER — Other Ambulatory Visit: Payer: Self-pay | Admitting: Internal Medicine

## 2023-08-24 DIAGNOSIS — E119 Type 2 diabetes mellitus without complications: Secondary | ICD-10-CM

## 2023-08-27 DIAGNOSIS — Z794 Long term (current) use of insulin: Secondary | ICD-10-CM | POA: Diagnosis not present

## 2023-08-27 DIAGNOSIS — N1832 Chronic kidney disease, stage 3b: Secondary | ICD-10-CM | POA: Diagnosis not present

## 2023-08-27 DIAGNOSIS — E1140 Type 2 diabetes mellitus with diabetic neuropathy, unspecified: Secondary | ICD-10-CM | POA: Diagnosis not present

## 2023-08-27 DIAGNOSIS — K760 Fatty (change of) liver, not elsewhere classified: Secondary | ICD-10-CM | POA: Diagnosis not present

## 2023-08-27 LAB — LIPID PANEL
Cholesterol: 150 (ref 0–200)
HDL: 50 (ref 35–70)
LDL Cholesterol: 74
Triglycerides: 156 (ref 40–160)

## 2023-08-27 LAB — MICROALBUMIN / CREATININE URINE RATIO: Microalb Creat Ratio: 17.5

## 2023-08-27 LAB — PROTEIN / CREATININE RATIO, URINE: Creatinine, Urine: 180

## 2023-08-27 LAB — MICROALBUMIN, URINE
Microalb, Ur: 3.15
Microalb, Ur: 3.15

## 2023-09-16 ENCOUNTER — Inpatient Hospital Stay (HOSPITAL_COMMUNITY)
Admission: EM | Admit: 2023-09-16 | Discharge: 2023-09-20 | DRG: 392 | Disposition: A | Discharge status: Home / Self Care | Payer: Medicare Other | Attending: Internal Medicine | Admitting: Internal Medicine

## 2023-09-16 ENCOUNTER — Other Ambulatory Visit: Payer: Self-pay

## 2023-09-16 ENCOUNTER — Emergency Department (HOSPITAL_COMMUNITY): Payer: Medicare Other

## 2023-09-16 ENCOUNTER — Encounter (HOSPITAL_COMMUNITY): Payer: Self-pay | Admitting: *Deleted

## 2023-09-16 DIAGNOSIS — F419 Anxiety disorder, unspecified: Secondary | ICD-10-CM | POA: Diagnosis present

## 2023-09-16 DIAGNOSIS — G8929 Other chronic pain: Secondary | ICD-10-CM | POA: Diagnosis not present

## 2023-09-16 DIAGNOSIS — Z23 Encounter for immunization: Secondary | ICD-10-CM | POA: Diagnosis not present

## 2023-09-16 DIAGNOSIS — A09 Infectious gastroenteritis and colitis, unspecified: Principal | ICD-10-CM | POA: Diagnosis present

## 2023-09-16 DIAGNOSIS — Z885 Allergy status to narcotic agent status: Secondary | ICD-10-CM

## 2023-09-16 DIAGNOSIS — R112 Nausea with vomiting, unspecified: Secondary | ICD-10-CM

## 2023-09-16 DIAGNOSIS — Z888 Allergy status to other drugs, medicaments and biological substances status: Secondary | ICD-10-CM

## 2023-09-16 DIAGNOSIS — M8580 Other specified disorders of bone density and structure, unspecified site: Secondary | ICD-10-CM | POA: Diagnosis present

## 2023-09-16 DIAGNOSIS — E119 Type 2 diabetes mellitus without complications: Secondary | ICD-10-CM

## 2023-09-16 DIAGNOSIS — K760 Fatty (change of) liver, not elsewhere classified: Secondary | ICD-10-CM | POA: Diagnosis not present

## 2023-09-16 DIAGNOSIS — E039 Hypothyroidism, unspecified: Secondary | ICD-10-CM | POA: Diagnosis present

## 2023-09-16 DIAGNOSIS — B9620 Unspecified Escherichia coli [E. coli] as the cause of diseases classified elsewhere: Secondary | ICD-10-CM | POA: Diagnosis present

## 2023-09-16 DIAGNOSIS — J45909 Unspecified asthma, uncomplicated: Secondary | ICD-10-CM | POA: Diagnosis not present

## 2023-09-16 DIAGNOSIS — I10 Essential (primary) hypertension: Secondary | ICD-10-CM | POA: Diagnosis not present

## 2023-09-16 DIAGNOSIS — K589 Irritable bowel syndrome without diarrhea: Secondary | ICD-10-CM | POA: Diagnosis present

## 2023-09-16 DIAGNOSIS — Z794 Long term (current) use of insulin: Secondary | ICD-10-CM

## 2023-09-16 DIAGNOSIS — E875 Hyperkalemia: Secondary | ICD-10-CM | POA: Diagnosis not present

## 2023-09-16 DIAGNOSIS — I129 Hypertensive chronic kidney disease with stage 1 through stage 4 chronic kidney disease, or unspecified chronic kidney disease: Secondary | ICD-10-CM | POA: Diagnosis present

## 2023-09-16 DIAGNOSIS — Z7982 Long term (current) use of aspirin: Secondary | ICD-10-CM

## 2023-09-16 DIAGNOSIS — E11649 Type 2 diabetes mellitus with hypoglycemia without coma: Secondary | ICD-10-CM | POA: Diagnosis not present

## 2023-09-16 DIAGNOSIS — F32A Depression, unspecified: Secondary | ICD-10-CM | POA: Diagnosis present

## 2023-09-16 DIAGNOSIS — K529 Noninfective gastroenteritis and colitis, unspecified: Principal | ICD-10-CM | POA: Diagnosis present

## 2023-09-16 DIAGNOSIS — R933 Abnormal findings on diagnostic imaging of other parts of digestive tract: Secondary | ICD-10-CM

## 2023-09-16 DIAGNOSIS — Z825 Family history of asthma and other chronic lower respiratory diseases: Secondary | ICD-10-CM

## 2023-09-16 DIAGNOSIS — N179 Acute kidney failure, unspecified: Secondary | ICD-10-CM | POA: Diagnosis not present

## 2023-09-16 DIAGNOSIS — Z6841 Body Mass Index (BMI) 40.0 and over, adult: Secondary | ICD-10-CM

## 2023-09-16 DIAGNOSIS — E1140 Type 2 diabetes mellitus with diabetic neuropathy, unspecified: Secondary | ICD-10-CM | POA: Diagnosis not present

## 2023-09-16 DIAGNOSIS — Z79890 Hormone replacement therapy (postmenopausal): Secondary | ICD-10-CM

## 2023-09-16 DIAGNOSIS — I471 Supraventricular tachycardia, unspecified: Secondary | ICD-10-CM | POA: Diagnosis not present

## 2023-09-16 DIAGNOSIS — K219 Gastro-esophageal reflux disease without esophagitis: Secondary | ICD-10-CM | POA: Diagnosis present

## 2023-09-16 DIAGNOSIS — K5730 Diverticulosis of large intestine without perforation or abscess without bleeding: Secondary | ICD-10-CM | POA: Diagnosis present

## 2023-09-16 DIAGNOSIS — N390 Urinary tract infection, site not specified: Secondary | ICD-10-CM | POA: Insufficient documentation

## 2023-09-16 DIAGNOSIS — Z1629 Resistance to other single specified antibiotic: Secondary | ICD-10-CM | POA: Diagnosis present

## 2023-09-16 DIAGNOSIS — Z833 Family history of diabetes mellitus: Secondary | ICD-10-CM

## 2023-09-16 DIAGNOSIS — N1832 Chronic kidney disease, stage 3b: Secondary | ICD-10-CM | POA: Diagnosis not present

## 2023-09-16 DIAGNOSIS — E1122 Type 2 diabetes mellitus with diabetic chronic kidney disease: Secondary | ICD-10-CM | POA: Diagnosis not present

## 2023-09-16 DIAGNOSIS — N3000 Acute cystitis without hematuria: Secondary | ICD-10-CM

## 2023-09-16 DIAGNOSIS — Z7984 Long term (current) use of oral hypoglycemic drugs: Secondary | ICD-10-CM

## 2023-09-16 DIAGNOSIS — Z90710 Acquired absence of both cervix and uterus: Secondary | ICD-10-CM

## 2023-09-16 DIAGNOSIS — Z8249 Family history of ischemic heart disease and other diseases of the circulatory system: Secondary | ICD-10-CM

## 2023-09-16 DIAGNOSIS — G4733 Obstructive sleep apnea (adult) (pediatric): Secondary | ICD-10-CM | POA: Diagnosis present

## 2023-09-16 DIAGNOSIS — Z79899 Other long term (current) drug therapy: Secondary | ICD-10-CM

## 2023-09-16 DIAGNOSIS — E785 Hyperlipidemia, unspecified: Secondary | ICD-10-CM | POA: Diagnosis not present

## 2023-09-16 DIAGNOSIS — Z7985 Long-term (current) use of injectable non-insulin antidiabetic drugs: Secondary | ICD-10-CM

## 2023-09-16 DIAGNOSIS — R109 Unspecified abdominal pain: Secondary | ICD-10-CM | POA: Diagnosis not present

## 2023-09-16 DIAGNOSIS — Z881 Allergy status to other antibiotic agents status: Secondary | ICD-10-CM

## 2023-09-16 DIAGNOSIS — Z96653 Presence of artificial knee joint, bilateral: Secondary | ICD-10-CM | POA: Diagnosis present

## 2023-09-16 DIAGNOSIS — Z882 Allergy status to sulfonamides status: Secondary | ICD-10-CM

## 2023-09-16 LAB — LIPASE, BLOOD: Lipase: 35 U/L (ref 11–51)

## 2023-09-16 LAB — URINALYSIS, ROUTINE W REFLEX MICROSCOPIC
Bilirubin Urine: NEGATIVE
Glucose, UA: NEGATIVE mg/dL
Ketones, ur: NEGATIVE mg/dL
Nitrite: NEGATIVE
Protein, ur: NEGATIVE mg/dL
Specific Gravity, Urine: 1.019 (ref 1.005–1.030)
WBC, UA: >50 WBC/hpf (ref 0–5)
pH: 5 (ref 5.0–8.0)

## 2023-09-16 LAB — COMPREHENSIVE METABOLIC PANEL
ALT: 12 U/L (ref 0–44)
AST: 20 U/L (ref 15–41)
Albumin: 4.5 g/dL (ref 3.5–5.0)
Alkaline Phosphatase: 78 U/L (ref 38–126)
Anion gap: 10 (ref 5–15)
BUN: 39 mg/dL — ABNORMAL HIGH (ref 8–23)
CO2: 21 mmol/L — ABNORMAL LOW (ref 22–32)
Calcium: 9.1 mg/dL (ref 8.9–10.3)
Chloride: 104 mmol/L (ref 98–111)
Creatinine, Ser: 1.87 mg/dL — ABNORMAL HIGH (ref 0.44–1.00)
GFR, Estimated: 29 mL/min — ABNORMAL LOW (ref >60)
Glucose, Bld: 164 mg/dL — ABNORMAL HIGH (ref 70–99)
Potassium: 6.1 mmol/L — ABNORMAL HIGH (ref 3.5–5.1)
Sodium: 135 mmol/L (ref 135–145)
Total Bilirubin: 0.7 mg/dL (ref 0.3–1.2)
Total Protein: 7.8 g/dL (ref 6.5–8.1)

## 2023-09-16 MED ORDER — FENTANYL CITRATE PF 50 MCG/ML IJ SOSY
75.0000 ug | PREFILLED_SYRINGE | Freq: Once | INTRAMUSCULAR | Status: AC
  Administered 2023-09-16: 75 ug via INTRAVENOUS
  Filled 2023-09-16: qty 2

## 2023-09-16 MED ORDER — ONDANSETRON HCL 4 MG/2ML IJ SOLN
4.0000 mg | Freq: Once | INTRAMUSCULAR | Status: AC
  Administered 2023-09-16: 4 mg via INTRAVENOUS
  Filled 2023-09-16: qty 2

## 2023-09-16 MED ORDER — SODIUM CHLORIDE 0.9 % IV SOLN
1.0000 g | Freq: Once | INTRAVENOUS | Status: AC
  Administered 2023-09-17: 1 g via INTRAVENOUS
  Filled 2023-09-16: qty 10

## 2023-09-16 MED ORDER — SODIUM CHLORIDE 0.9 % IV BOLUS
1000.0000 mL | Freq: Once | INTRAVENOUS | Status: AC
  Administered 2023-09-16: 1000 mL via INTRAVENOUS

## 2023-09-16 NOTE — ED Provider Notes (Signed)
[place] EMERGENCY DEPARTMENT AT [facility] Provider Note   CSN: [id number] Arrival date & time: 09/16/23  1644     History {Add pertinent medical, surgical, social history, OB history to HPI:1} Chief Complaint  Patient presents with   Abdominal Pain   Emesis   Nausea    Meredith Hayden is a 73 y.o. female, history of diabetes, colitis, who presents to the ED secondary to abdominal pain since this a.m., associated rectal bleeding.  She states that she had acute abdominal pain that started this morning, that was cramping in nature, associated with nausea, vomiting, and 4 episodes of diarrhea.  She states she shipped she tried to dry heave 4 times, without anything really coming.  After the 4 episodes of diarrhea she started having bright red blood from her rectum.  She denies any dizziness, or shortness of breath.  Is not on any blood thinners, but does take aspirin daily.  States she has had something like this similarly happened before, and she had to be hospitalized for colitis.  Notes that she did have a temperature of 100 today, but denies any current fever.  History of 3 abdominal surgeries including C-section, hysterectomy, and a bowel resection secondary to severe diverticulitis.  Reports she is not passing gas  Home Medications Prior to Admission medications   Medication Sig Start Date End Date Taking? Authorizing Provider  aspirin 81 MG tablet Take 81 mg by mouth daily.     [provider]  cetirizine (ZYRTEC) 10 MG tablet Take 10 mg by mouth daily.     [provider]  Cholecalciferol (VITAMIN D3) 125 MCG (5000 UT) TBDP Take 5,000 Units by mouth daily.    [provider]  clobetasol ointment (TEMOVATE) 0.05 % Apply 1 Application topically 2 (two) times daily.    [provider]  Continuous Blood Gluc Receiver (FREESTYLE LIBRE 2 READER) DEVI 1 Act by Does not apply route daily. 07/17/21   Etta Grandchild, MD  Continuous  Blood Gluc Sensor (FREESTYLE LIBRE 2 SENSOR) MISC 1 Act by Does not apply route daily. 07/17/21   Etta Grandchild, MD  dicyclomine (BENTYL) 10 MG capsule TAKE 1 CAPSULE BY MOUTH 3 TIMES  DAILY BEFORE MEALS 11/20/21   Etta Grandchild, MD  fluticasone (FLONASE) 50 MCG/ACT nasal spray Place 1 spray into both nostrils daily.    [provider]  gabapentin (NEURONTIN) 600 MG tablet Take 1,200 mg by mouth 2 (two) times daily. 10/31/21   [provider]  Glucagon (GVOKE HYPOPEN 2-PACK) 1 MG/0.2ML SOAJ Inject 1 Act into the skin daily as needed. 10/13/20   Etta Grandchild, MD  HUMALOG KWIKPEN 100 UNIT/ML KwikPen INJECT SUBCUTANEOUSLY 16 UNITS 3 TIMES DAILY 10/28/22   Etta Grandchild, MD  Insulin Pen Needle (UNIFINE PENTIPS PLUS) 33G X 4 MM MISC Inject 1 Act into the skin 4 (four) times daily. 01/31/23   Etta Grandchild, MD  ipratropium (ATROVENT HFA) 17 MCG/ACT inhaler USE 2 PUFFS EVERY 4 HOURS  AS NEEDED FOR WHEEZING. 03/26/19   Etta Grandchild, MD  KERENDIA 20 MG TABS TAKE 1 TABLET BY MOUTH DAILY 08/02/23   Etta Grandchild, MD  Lancets [facility] DELICA PLUS LANCET33G) MISC USE AS DIRECTED TO CHECK BLOOD SUGAR 4 TIMES DAILY AFTER MEALS AND AT BEDTIME. 05/24/20   Etta Grandchild, MD  lisinopril (ZESTRIL) 20 MG tablet TAKE 1 TABLET BY MOUTH DAILY 07/08/23   Etta Grandchild, MD  loperamide (IMODIUM)  2 MG capsule Take by mouth as needed for diarrhea or loose stools.    [provider]  metoprolol tartrate (LOPRESSOR) 25 MG tablet Take 0.5 tablets (12.5 mg total) by mouth 2 (two) times daily. 08/13/23   Corky Crafts, MD  nystatin ointment (MYCOSTATIN) Apply 1 Application topically 2 (two) times daily.    [provider]  omeprazole (PRILOSEC) 20 MG capsule Take 20 mg by mouth daily as needed.    [provider]  [facility] VERIO test strip CHECK BLOOD SUGAR 4 TIMES A DAY. 07/25/20   Etta Grandchild, MD  rosuvastatin (CRESTOR) 20 MG tablet TAKE 1 TABLET BY MOUTH DAILY 07/08/23    Etta Grandchild, MD  Simethicone 125 MG CAPS Take 1 capsule by mouth daily as needed.    [provider]  SYNTHROID 75 MCG tablet TAKE 1 TABLET DAILY BEFORE BREAKFAST FOR HYPOTHYROIDISM 03/26/23   Etta Grandchild, MD  TOUJEO SOLOSTAR 300 UNIT/ML Solostar Pen INJECT SUBCUTANEOUSLY 50 UNITS  DAILY 04/15/23   Etta Grandchild, MD  TRADJENTA 5 MG TABS tablet TAKE 1 TABLET BY MOUTH DAILY 08/24/23   Etta Grandchild, MD  zaleplon (SONATA) 10 MG capsule Take 10 mg by mouth at bedtime as needed for sleep.    [provider]      Allergies    Jardiance [empagliflozin], Oxycodone hcl, Oxycodone-acetaminophen, Sulfamethoxazole, Benzodiazepines, Trulicity [dulaglutide], Codeine phosphate, Erythromycin, Metformin and related, Morphine sulfate, and Tylenol [acetaminophen]    Review of Systems   Review of Systems  Gastrointestinal:  Positive for abdominal pain, anal bleeding, blood in stool, diarrhea, nausea and vomiting. Negative for constipation.    Physical Exam Updated Vital Signs BP 137/72 (BP Location: Right Arm)   Pulse (!) 102   Temp 98.1 F (36.7 C) (Oral)   Resp 20   Ht 5\' 2"  (1.575 m)   Wt 113.4 kg   LMP 12/31/1993   SpO2 96%   BMI 45.73 kg/m  Physical Exam Vitals and nursing note reviewed. Exam conducted with a chaperone present.  Constitutional:      General: She is not in acute distress.    Appearance: She is well-developed.  HENT:     Head: Normocephalic and atraumatic.  Eyes:     Conjunctiva/sclera: Conjunctivae normal.  Cardiovascular:     Rate and Rhythm: Normal rate and regular rhythm.     Heart sounds: No murmur heard. Pulmonary:     Effort: Pulmonary effort is normal. No respiratory distress.     Breath sounds: Normal breath sounds.  Abdominal:     Palpations: Abdomen is soft.     Tenderness: There is generalized abdominal tenderness. There is guarding.  Genitourinary:    Comments: Bright red blood per rectum.  No evidence of any kind of fissure or  mass. Musculoskeletal:        General: No swelling.     Cervical back: Neck supple.  Skin:    General: Skin is warm and dry.     Capillary Refill: Capillary refill takes less than 2 seconds.  Neurological:     Mental Status: She is alert.  Psychiatric:        Mood and Affect: Mood normal.     ED Results / Procedures / Treatments   Labs (all labs ordered are listed, but only abnormal results are displayed) Labs Reviewed  COMPREHENSIVE METABOLIC PANEL - Abnormal; Notable for the following components:      Result Value   Potassium 6.1 (*)  CO2 21 (*)    Glucose, Bld 164 (*)    BUN 39 (*)    Creatinine, Ser 1.87 (*)    GFR, Estimated 29 (*)    All other components within normal limits  LIPASE, BLOOD  URINALYSIS, ROUTINE W REFLEX MICROSCOPIC  CBC  OCCULT BLOOD X 1 CARD TO LAB, STOOL  LACTIC ACID, PLASMA  LACTIC ACID, PLASMA  POTASSIUM    EKG None  Radiology No results found.  Procedures Procedures  {Document cardiac monitor, telemetry assessment procedure when appropriate:1}  Medications Ordered in ED Medications  sodium chloride 0.9 % bolus 1,000 mL (has no administration in time range)  ondansetron (ZOFRAN) injection 4 mg (has no administration in time range)  fentaNYL (SUBLIMAZE) injection 75 mcg (has no administration in time range)    ED Course/ Medical Decision Making/ A&P   {   Click here for ABCD2, HEART and other calculatorsREFRESH Note before signing :1}                              Medical Decision Making Amount and/or Complexity of Data Reviewed Labs: ordered. Radiology: ordered.  Risk Prescription drug management.   ***  {Document critical care time when appropriate:1} {Document review of labs and clinical decision tools ie heart score, Chads2Vasc2 etc:1}  {Document your independent review of radiology images, and any outside records:1} {Document your discussion with family members, caretakers, and with consultants:1} {Document social  determinants of health affecting pt's care:1} {Document your decision making why or why not admission, treatments were needed:1} Final Clinical Impression(s) / ED Diagnoses Final diagnoses:  None    Rx / DC Orders ED Discharge Orders     None

## 2023-09-16 NOTE — ED Triage Notes (Addendum)
This morning abd pain with N/V and increased stools presenting with blood, Pt now more blood than stool.  has history of same. Unable to eat and drink.

## 2023-09-17 ENCOUNTER — Other Ambulatory Visit: Payer: Self-pay | Admitting: Internal Medicine

## 2023-09-17 DIAGNOSIS — K529 Noninfective gastroenteritis and colitis, unspecified: Secondary | ICD-10-CM | POA: Diagnosis not present

## 2023-09-17 DIAGNOSIS — G4733 Obstructive sleep apnea (adult) (pediatric): Secondary | ICD-10-CM | POA: Diagnosis present

## 2023-09-17 DIAGNOSIS — E039 Hypothyroidism, unspecified: Secondary | ICD-10-CM | POA: Diagnosis present

## 2023-09-17 DIAGNOSIS — F32A Depression, unspecified: Secondary | ICD-10-CM | POA: Diagnosis present

## 2023-09-17 DIAGNOSIS — R1032 Left lower quadrant pain: Secondary | ICD-10-CM | POA: Diagnosis not present

## 2023-09-17 DIAGNOSIS — N1832 Chronic kidney disease, stage 3b: Secondary | ICD-10-CM | POA: Diagnosis present

## 2023-09-17 DIAGNOSIS — I10 Essential (primary) hypertension: Secondary | ICD-10-CM

## 2023-09-17 DIAGNOSIS — E1122 Type 2 diabetes mellitus with diabetic chronic kidney disease: Secondary | ICD-10-CM | POA: Diagnosis present

## 2023-09-17 DIAGNOSIS — K760 Fatty (change of) liver, not elsewhere classified: Secondary | ICD-10-CM | POA: Diagnosis present

## 2023-09-17 DIAGNOSIS — N390 Urinary tract infection, site not specified: Secondary | ICD-10-CM | POA: Diagnosis present

## 2023-09-17 DIAGNOSIS — Z1629 Resistance to other single specified antibiotic: Secondary | ICD-10-CM | POA: Diagnosis present

## 2023-09-17 DIAGNOSIS — R933 Abnormal findings on diagnostic imaging of other parts of digestive tract: Secondary | ICD-10-CM | POA: Diagnosis not present

## 2023-09-17 DIAGNOSIS — Z6841 Body Mass Index (BMI) 40.0 and over, adult: Secondary | ICD-10-CM | POA: Diagnosis not present

## 2023-09-17 DIAGNOSIS — E785 Hyperlipidemia, unspecified: Secondary | ICD-10-CM

## 2023-09-17 DIAGNOSIS — E875 Hyperkalemia: Secondary | ICD-10-CM | POA: Diagnosis present

## 2023-09-17 DIAGNOSIS — E11319 Type 2 diabetes mellitus with unspecified diabetic retinopathy without macular edema: Secondary | ICD-10-CM

## 2023-09-17 DIAGNOSIS — N179 Acute kidney failure, unspecified: Secondary | ICD-10-CM

## 2023-09-17 DIAGNOSIS — F419 Anxiety disorder, unspecified: Secondary | ICD-10-CM | POA: Diagnosis present

## 2023-09-17 DIAGNOSIS — I471 Supraventricular tachycardia, unspecified: Secondary | ICD-10-CM | POA: Diagnosis present

## 2023-09-17 DIAGNOSIS — Z794 Long term (current) use of insulin: Secondary | ICD-10-CM | POA: Diagnosis not present

## 2023-09-17 DIAGNOSIS — E11649 Type 2 diabetes mellitus with hypoglycemia without coma: Secondary | ICD-10-CM | POA: Diagnosis not present

## 2023-09-17 DIAGNOSIS — B9620 Unspecified Escherichia coli [E. coli] as the cause of diseases classified elsewhere: Secondary | ICD-10-CM | POA: Diagnosis present

## 2023-09-17 DIAGNOSIS — Z23 Encounter for immunization: Secondary | ICD-10-CM | POA: Diagnosis not present

## 2023-09-17 DIAGNOSIS — I129 Hypertensive chronic kidney disease with stage 1 through stage 4 chronic kidney disease, or unspecified chronic kidney disease: Secondary | ICD-10-CM | POA: Diagnosis present

## 2023-09-17 DIAGNOSIS — E1140 Type 2 diabetes mellitus with diabetic neuropathy, unspecified: Secondary | ICD-10-CM | POA: Diagnosis present

## 2023-09-17 DIAGNOSIS — J45909 Unspecified asthma, uncomplicated: Secondary | ICD-10-CM | POA: Diagnosis present

## 2023-09-17 DIAGNOSIS — A09 Infectious gastroenteritis and colitis, unspecified: Secondary | ICD-10-CM | POA: Diagnosis present

## 2023-09-17 DIAGNOSIS — K625 Hemorrhage of anus and rectum: Secondary | ICD-10-CM | POA: Diagnosis not present

## 2023-09-17 DIAGNOSIS — G8929 Other chronic pain: Secondary | ICD-10-CM | POA: Diagnosis present

## 2023-09-17 LAB — CBC WITH DIFFERENTIAL/PLATELET
Abs Immature Granulocytes: 0.09 10*3/uL — ABNORMAL HIGH (ref 0.00–0.07)
Basophils Absolute: 0 10*3/uL (ref 0.0–0.1)
Basophils Relative: 0 %
Eosinophils Absolute: 0 10*3/uL (ref 0.0–0.5)
Eosinophils Relative: 0 %
HCT: 39.6 % (ref 36.0–46.0)
Hemoglobin: 12.8 g/dL (ref 12.0–15.0)
Immature Granulocytes: 1 %
Lymphocytes Relative: 18 %
Lymphs Abs: 2.5 10*3/uL (ref 0.7–4.0)
MCH: 30.8 pg (ref 26.0–34.0)
MCHC: 32.3 g/dL (ref 30.0–36.0)
MCV: 95.4 fL (ref 80.0–100.0)
Monocytes Absolute: 1.3 10*3/uL — ABNORMAL HIGH (ref 0.1–1.0)
Monocytes Relative: 9 %
Neutro Abs: 10.1 10*3/uL — ABNORMAL HIGH (ref 1.7–7.7)
Neutrophils Relative %: 72 %
Platelets: 161 10*3/uL (ref 150–400)
RBC: 4.15 MIL/uL (ref 3.87–5.11)
RDW: 12.7 % (ref 11.5–15.5)
WBC: 14 10*3/uL — ABNORMAL HIGH (ref 4.0–10.5)
nRBC: 0 % (ref 0.0–0.2)

## 2023-09-17 LAB — CBG MONITORING, ED
Glucose-Capillary: 105 mg/dL — ABNORMAL HIGH (ref 70–99)
Glucose-Capillary: 98 mg/dL (ref 70–99)
Glucose-Capillary: 112 mg/dL — ABNORMAL HIGH (ref 70–99)
Glucose-Capillary: 79 mg/dL (ref 70–99)

## 2023-09-17 LAB — BASIC METABOLIC PANEL
Anion gap: 9 (ref 5–15)
BUN: 36 mg/dL — ABNORMAL HIGH (ref 8–23)
CO2: 20 mmol/L — ABNORMAL LOW (ref 22–32)
Calcium: 8.4 mg/dL — ABNORMAL LOW (ref 8.9–10.3)
Chloride: 107 mmol/L (ref 98–111)
Creatinine, Ser: 1.7 mg/dL — ABNORMAL HIGH (ref 0.44–1.00)
GFR, Estimated: 32 mL/min — ABNORMAL LOW (ref >60)
Glucose, Bld: 104 mg/dL — ABNORMAL HIGH (ref 70–99)
Potassium: 4.8 mmol/L (ref 3.5–5.1)
Sodium: 136 mmol/L (ref 135–145)

## 2023-09-17 LAB — POC OCCULT BLOOD, ED: Fecal Occult Bld: POSITIVE — AB

## 2023-09-17 LAB — C DIFFICILE QUICK SCREEN W PCR REFLEX
C Diff antigen: NEGATIVE
C Diff interpretation: NOT DETECTED
C Diff toxin: NEGATIVE

## 2023-09-17 LAB — CBC
HCT: 38.6 % (ref 36.0–46.0)
Hemoglobin: 12.2 g/dL (ref 12.0–15.0)
MCH: 30.5 pg (ref 26.0–34.0)
MCHC: 31.6 g/dL (ref 30.0–36.0)
MCV: 96.5 fL (ref 80.0–100.0)
Platelets: 149 10*3/uL — ABNORMAL LOW (ref 150–400)
RBC: 4 MIL/uL (ref 3.87–5.11)
RDW: 12.8 % (ref 11.5–15.5)
WBC: 13.6 10*3/uL — ABNORMAL HIGH (ref 4.0–10.5)
nRBC: 0 % (ref 0.0–0.2)

## 2023-09-17 LAB — LACTIC ACID, PLASMA
Lactic Acid, Venous: 1.8 mmol/L (ref 0.5–1.9)
Lactic Acid, Venous: 0.7 mmol/L (ref 0.5–1.9)

## 2023-09-17 LAB — POTASSIUM: Potassium: 5.1 mmol/L (ref 3.5–5.1)

## 2023-09-17 LAB — HIV ANTIBODY (ROUTINE TESTING W REFLEX): HIV Screen 4th Generation wRfx: NONREACTIVE

## 2023-09-17 LAB — GLUCOSE, CAPILLARY: Glucose-Capillary: 90 mg/dL (ref 70–99)

## 2023-09-17 MED ORDER — GABAPENTIN 300 MG PO CAPS
600.0000 mg | ORAL_CAPSULE | Freq: Two times a day (BID) | ORAL | Status: DC
  Administered 2023-09-17 – 2023-09-20 (×7): 600 mg via ORAL
  Filled 2023-09-17 (×5): qty 6
  Filled 2023-09-17: qty 2
  Filled 2023-09-17: qty 6

## 2023-09-17 MED ORDER — SIMETHICONE 80 MG PO CHEW
80.0000 mg | CHEWABLE_TABLET | Freq: Once | ORAL | Status: AC
  Administered 2023-09-17: 80 mg via ORAL

## 2023-09-17 MED ORDER — METRONIDAZOLE 500 MG PO TABS
500.0000 mg | ORAL_TABLET | Freq: Two times a day (BID) | ORAL | Status: DC
  Administered 2023-09-17 – 2023-09-20 (×7): 500 mg via ORAL
  Filled 2023-09-17 (×7): qty 1

## 2023-09-17 MED ORDER — INSULIN GLARGINE-YFGN 100 UNIT/ML ~~LOC~~ SOLN
45.0000 [IU] | Freq: Every day | SUBCUTANEOUS | Status: DC
  Administered 2023-09-17: 45 [IU] via SUBCUTANEOUS
  Filled 2023-09-17 (×2): qty 0.45

## 2023-09-17 MED ORDER — LEVOTHYROXINE SODIUM 75 MCG PO TABS
75.0000 ug | ORAL_TABLET | Freq: Every day | ORAL | Status: DC
  Administered 2023-09-17 – 2023-09-20 (×4): 75 ug via ORAL
  Filled 2023-09-17 (×4): qty 1

## 2023-09-17 MED ORDER — SIMETHICONE 80 MG PO CHEW
CHEWABLE_TABLET | ORAL | Status: AC
  Administered 2023-09-17: 80 mg
  Filled 2023-09-17: qty 1

## 2023-09-17 MED ORDER — SODIUM CHLORIDE 0.9 % IV SOLN
2.0000 g | INTRAVENOUS | Status: DC
  Administered 2023-09-18 – 2023-09-20 (×3): 2 g via INTRAVENOUS
  Filled 2023-09-17 (×3): qty 20

## 2023-09-17 MED ORDER — METRONIDAZOLE 500 MG PO TABS
500.0000 mg | ORAL_TABLET | Freq: Once | ORAL | Status: AC
  Administered 2023-09-17: 500 mg via ORAL
  Filled 2023-09-17: qty 1

## 2023-09-17 MED ORDER — FENTANYL CITRATE PF 50 MCG/ML IJ SOSY
25.0000 ug | PREFILLED_SYRINGE | Freq: Once | INTRAMUSCULAR | Status: AC
  Administered 2023-09-17: 25 ug via INTRAVENOUS
  Filled 2023-09-17: qty 1

## 2023-09-17 MED ORDER — SODIUM CHLORIDE 0.9 % IV SOLN: INTRAVENOUS | Status: DC

## 2023-09-17 MED ORDER — PANTOPRAZOLE SODIUM 40 MG PO TBEC
40.0000 mg | DELAYED_RELEASE_TABLET | Freq: Every day | ORAL | Status: DC
  Administered 2023-09-17 – 2023-09-20 (×4): 40 mg via ORAL
  Filled 2023-09-17 (×4): qty 1

## 2023-09-17 MED ORDER — NALOXONE HCL 0.4 MG/ML IJ SOLN: 0.4000 mg | INTRAMUSCULAR | Status: DC | PRN

## 2023-09-17 MED ORDER — ROSUVASTATIN CALCIUM 20 MG PO TABS
20.0000 mg | ORAL_TABLET | Freq: Every day | ORAL | Status: DC
  Administered 2023-09-17 – 2023-09-20 (×4): 20 mg via ORAL
  Filled 2023-09-17 (×4): qty 1

## 2023-09-17 MED ORDER — ONDANSETRON HCL 4 MG/2ML IJ SOLN: 4.0000 mg | Freq: Four times a day (QID) | INTRAMUSCULAR | Status: DC | PRN

## 2023-09-17 MED ORDER — SODIUM CHLORIDE 0.9 % IV SOLN
1.0000 g | Freq: Once | INTRAVENOUS | Status: AC
  Administered 2023-09-17: 1 g via INTRAVENOUS
  Filled 2023-09-17: qty 10

## 2023-09-17 MED ORDER — FENTANYL CITRATE PF 50 MCG/ML IJ SOSY
25.0000 ug | PREFILLED_SYRINGE | INTRAMUSCULAR | Status: DC | PRN
  Administered 2023-09-18 – 2023-09-19 (×2): 25 ug via INTRAVENOUS
  Filled 2023-09-17 (×2): qty 1

## 2023-09-17 MED ORDER — INSULIN ASPART 100 UNIT/ML IJ SOLN
0.0000 [IU] | Freq: Every day | INTRAMUSCULAR | Status: DC
  Filled 2023-09-17: qty 0.05

## 2023-09-17 MED ORDER — VORTIOXETINE HBR 5 MG PO TABS
20.0000 mg | ORAL_TABLET | Freq: Every day | ORAL | Status: DC
  Administered 2023-09-17 – 2023-09-20 (×4): 20 mg via ORAL
  Filled 2023-09-17 (×4): qty 4

## 2023-09-17 MED ORDER — TRAZODONE HCL 50 MG PO TABS
50.0000 mg | ORAL_TABLET | Freq: Every day | ORAL | Status: DC
  Administered 2023-09-17 – 2023-09-19 (×3): 50 mg via ORAL
  Filled 2023-09-17 (×3): qty 1

## 2023-09-17 MED ORDER — METOPROLOL TARTRATE 12.5 MG HALF TABLET
12.5000 mg | ORAL_TABLET | Freq: Two times a day (BID) | ORAL | Status: DC
  Administered 2023-09-17 – 2023-09-20 (×7): 12.5 mg via ORAL
  Filled 2023-09-17 (×7): qty 1

## 2023-09-17 MED ORDER — INSULIN ASPART 100 UNIT/ML IJ SOLN
0.0000 [IU] | Freq: Three times a day (TID) | INTRAMUSCULAR | Status: DC
  Filled 2023-09-17: qty 0.09

## 2023-09-17 NOTE — H&P (Signed)
History and Physical    Meredith Hayden ZOX:[id number] DOB: Dec 12, 1950 DOA: 09/16/2023  PCP: Etta Grandchild, MD  Patient coming from: Home  Chief Complaint: Abdominal pain  HPI: Meredith Hayden is a 73 y.o. female with medical history significant of insulin-dependent diabetes, GERD, hypertension, hyperlipidemia, IBS, OSA, anxiety, depression, asthma, hypothyroidism, SVT, CKD stage IIIb presented to ED with complaints of abdominal, nausea, vomiting, and bloody diarrhea.  Hemodynamically stable, afebrile.  Labs notable for WBC 14.0, hemoglobin 12.8, platelet count 161k, potassium 5.1, bicarb 21, glucose 164, BUN 39, creatinine 1.8 (baseline 1.3-1.5), normal lipase and LFTs, lactic acid normal x 2.  UA with large amount of leukocytes and microscopy showing >50 WBCs and many bacteria. CT abdomen pelvis showing infectious or inflammatory colitis involving the left colon. ED PA sent a secure chat message to Dr. Elnoria Howard on-call for GI requesting consultation in the morning.  Patient was given fentanyl, ceftriaxone, metronidazole, Zofran, and 1 L normal saline.  [facility] called to admit.   Patient states yesterday morning she started having severe left lower quadrant abdominal pain associated with nausea, vomiting, and bloody diarrhea.  No further episodes of vomiting since noon yesterday but continued to have bloody diarrhea throughout the day.  She reports history of colitis 2 years ago.  Denies fevers.  She is not sure if she was having any dysuria.  Denies cough, shortness of breath, or chest pain.  She takes aspirin at home and no other blood thinners.   Review of Systems:  Review of Systems  All other systems reviewed and are negative.   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 Nicholas Lose   CESAREAN SECTION  1978   FOOT SURGERY Bilateral    NASAL SINUS SURGERY     OOPHORECTOMY     PARTIAL COLECTOMY     -RSO   ROTATOR CUFF REPAIR Left    TOTAL KNEE ARTHROPLASTY Bilateral 2002, 2004     reports that she has never smoked. She has never used smokeless tobacco. She reports that she does not drink alcohol and does not use drugs.  Allergies  Allergen Reactions   Jardiance [Empagliflozin] Other (See Comments)    Vaginal yeast infections   Oxycodone Hcl Anaphylaxis    REACTION: unspecified   Oxycodone-Acetaminophen Anaphylaxis    Patient states that she can regular tylenol without any problems   Sulfamethoxazole     REACTION: rash   Benzodiazepines    Trulicity [Dulaglutide] Other (See Comments)    Upper  GI pain   Codeine Phosphate Itching    REACTION: unspecified   Erythromycin Other (See Comments)    Dizzy and sweaty, vomiting, diarrhea    Metformin And Related Diarrhea   Morphine Sulfate Itching    Tolerated hydromorphone    Tylenol [Acetaminophen] Other (See Comments)    Fatty liver    Family History  Problem Relation Age of Onset   COPD Mother    Diabetes Mother    Heart attack Father 51   Hypertension Father    Heart attack Sister 5   Diabetes Sister    Hypertension Sister    Diabetes Brother    Hypertension Sister    Diabetes Sister    Hyperlipidemia Other        fhx    Hypertension Other        fhx   Cancer Other        ovarian/grandmother   Ovarian cancer Maternal Aunt    Diabetes Maternal Aunt    Stomach cancer Maternal Grandmother    Liver cancer Maternal Grandmother     Prior to Admission medications   Medication Sig Start Date End Date Taking? Authorizing Provider  aspirin 81 MG tablet Take 81 mg by mouth daily.    Yes [provider]  cetirizine (ZYRTEC) 10 MG tablet Take 10 mg by mouth daily.    Yes [provider]  Cholecalciferol (VITAMIN D3) 125 MCG (5000 UT) TBDP Take 5,000 Units by mouth 3 (three) times a week.   Yes [provider]  clobetasol ointment (TEMOVATE) 0.05 % Apply 1 Application topically daily.   Yes [provider]  dicyclomine (BENTYL) 10 MG capsule TAKE 1 CAPSULE BY MOUTH 3 TIMES  DAILY BEFORE MEALS Patient taking differently: Take 10 mg by mouth 3 (three) times daily as needed for spasms. 11/20/21  Yes Etta Grandchild, MD  fluticasone (FLONASE) 50 MCG/ACT nasal spray Place 1 spray into both nostrils daily.   Yes [provider]  gabapentin (NEURONTIN) 600 MG tablet Take 600 mg by mouth 2 (two) times daily. 10/31/21  Yes [provider]  Glucagon (GVOKE HYPOPEN 2-PACK) 1 MG/0.2ML SOAJ Inject 1 Act into the skin daily as needed. 10/13/20  Yes Etta Grandchild, MD  HUMALOG KWIKPEN 100 UNIT/ML KwikPen INJECT SUBCUTANEOUSLY 16 UNITS 3 TIMES DAILY Patient taking differently: Inject 14 Units into the skin in the morning and at bedtime. 10/28/22  Yes Etta Grandchild, MD  ipratropium (ATROVENT HFA) 17 MCG/ACT inhaler USE 2 PUFFS EVERY 4 HOURS  AS NEEDED FOR WHEEZING. 03/26/19  Yes Etta Grandchild, MD  KERENDIA 20 MG TABS TAKE 1 TABLET BY MOUTH DAILY 08/02/23  Yes Etta Grandchild, MD  lisinopril (ZESTRIL) 20 MG tablet TAKE 1 TABLET BY MOUTH DAILY 07/08/23  Yes Etta Grandchild, MD  loperamide (IMODIUM) 2 MG capsule Take 2 mg by mouth as needed for diarrhea or loose stools.   Yes [provider]  metoprolol tartrate (LOPRESSOR) 25 MG tablet Take 0.5 tablets (12.5 mg total) by mouth 2 (two) times daily. 08/13/23  Yes Corky Crafts, MD  nystatin ointment (MYCOSTATIN) Apply 1 Application topically 2 (two) times daily as needed.   Yes [provider]  omeprazole (PRILOSEC) 20 MG capsule Take 20 mg by mouth daily.   Yes [provider]  rosuvastatin (CRESTOR) 20 MG tablet TAKE 1 TABLET BY MOUTH DAILY 07/08/23  Yes Etta Grandchild, MD  Semaglutide,0.25 or 0.5MG /DOS, (OZEMPIC, 0.25 OR 0.5 MG/DOSE,) 2  MG/3ML SOPN Inject 0.25 mg into the skin once a week.   Yes [provider]  Simethicone 125 MG CAPS Take 1 capsule by mouth daily as needed.   Yes [provider]  SYNTHROID 75 MCG tablet TAKE 1 TABLET DAILY BEFORE BREAKFAST FOR HYPOTHYROIDISM 03/26/23  Yes Etta Grandchild, MD  TOUJEO SOLOSTAR 300 UNIT/ML Solostar Pen INJECT SUBCUTANEOUSLY 50 UNITS  DAILY Patient taking differently: Inject 45 Units into the skin daily. 04/15/23  Yes Etta Grandchild, MD  traZODone (DESYREL) 50 MG tablet Take 50 mg by mouth at bedtime. 05/20/23  Yes [provider]  TRINTELLIX 20 MG TABS tablet Take 20 mg by mouth daily. 07/13/23  Yes [provider]  zaleplon (SONATA) 10 MG capsule Take 10 mg by mouth at bedtime as needed for sleep.   Yes [provider]  Continuous Blood Gluc Receiver (FREESTYLE LIBRE 2 READER) DEVI 1 Act by Does not apply route daily. 07/17/21   Etta Grandchild, MD  Continuous Blood Gluc Sensor (FREESTYLE LIBRE 2 SENSOR) MISC 1 Act by Does not apply route daily. 07/17/21   Etta Grandchild, MD  Insulin Pen Needle (UNIFINE PENTIPS PLUS) 33G X 4 MM MISC Inject 1 Act into the skin 4 (four) times daily. 01/31/23   Etta Grandchild, MD  Lancets (ONETOUCH DELICA PLUS LANCET33G) MISC USE AS DIRECTED TO CHECK BLOOD SUGAR 4 TIMES DAILY AFTER MEALS AND AT BEDTIME. 05/24/20   Etta Grandchild, MD  [facility] VERIO test strip CHECK BLOOD SUGAR  4 TIMES A DAY. 07/25/20   Etta Grandchild, MD  TRADJENTA 5 MG TABS tablet TAKE 1 TABLET BY MOUTH DAILY Patient not taking: Reported on 09/17/2023 08/24/23   Etta Grandchild, MD    Physical Exam: Vitals:   09/17/23 0407 09/17/23 0545 09/17/23 0630 09/17/23 0700  BP:  135/88 137/72 137/78  Pulse:  72 72 72  Resp:  16 14 15   Temp: 98.2 F (36.8 C) 98.4 F (36.9 C)    TempSrc: Oral Oral    SpO2:  92% 95% 96%  Weight:      Height:        Physical Exam Vitals reviewed.  Constitutional:      General: She is not in acute distress. HENT:     Head: Normocephalic and atraumatic.  Eyes:     Extraocular Movements: Extraocular movements intact.  Cardiovascular:     Rate and Rhythm: Normal rate and regular rhythm.     Pulses: Normal pulses.  Pulmonary:     Effort: Pulmonary effort is normal. No respiratory distress.     Breath sounds: Normal breath sounds. No wheezing or rales.  Abdominal:     General: Bowel sounds are normal. There is no distension.     Palpations: Abdomen is soft.     Tenderness: There is abdominal tenderness. There is no guarding or rebound.     Comments: Left lower quadrant tender to palpation  Musculoskeletal:     Cervical back: Normal range of motion.     Right lower leg: No edema.     Left lower leg: No edema.  Skin:    General: Skin is warm and dry.  Neurological:     General: No focal deficit present.     Mental Status: She is alert and oriented to person, place, and time.     Labs on Admission: I have personally reviewed following labs and imaging studies  CBC: Recent Labs  Lab 09/17/23 0127  WBC 14.0*  NEUTROABS 10.1*  HGB 12.8  HCT 39.6  MCV 95.4  PLT 161   Basic Metabolic Panel: Recent Labs  Lab 09/16/23 2044 09/16/23 2356  NA 135  --   K 6.1* 5.1  CL 104  --   CO2 21*  --   GLUCOSE 164*  --   BUN 39*  --   CREATININE 1.87*  --   CALCIUM 9.1  --    GFR: Estimated Creatinine Clearance: 33.3 mL/min (A) (by C-G formula based on  SCr of 1.87 mg/dL (H)). Liver Function Tests: Recent Labs  Lab 09/16/23 2044  AST 20  ALT 12  ALKPHOS 78  BILITOT 0.7  PROT 7.8  ALBUMIN 4.5   Recent Labs  Lab 09/16/23 2044  LIPASE 35   No results for input(s): "AMMONIA" in the last 168 hours. Coagulation Profile: No results for input(s): "INR", "PROTIME" in the last 168 hours. Cardiac Enzymes: No results for input(s): "CKTOTAL", "CKMB", "CKMBINDEX", "TROPONINI" in the last 168 hours. BNP (last 3 results) No results for input(s): "PROBNP" in the last 8760 hours. HbA1C: No results for input(s): "HGBA1C" in the last 72 hours. CBG: No results for input(s): "GLUCAP" in the last 168 hours. Lipid Profile: No results for input(s): "CHOL", "HDL", "LDLCALC", "TRIG", "CHOLHDL", "LDLDIRECT" in the last 72 hours. Thyroid Function Tests: No results for input(s): "TSH", "T4TOTAL", "FREET4", "T3FREE", "THYROIDAB" in the last 72 hours. Anemia Panel: No results for input(s): "VITAMINB12", "FOLATE", "FERRITIN", "TIBC", "IRON", "RETICCTPCT" in the last 72 hours. Urine analysis:    Component Value Date/Time   COLORURINE YELLOW 09/16/2023 2234   APPEARANCEUR HAZY (A) 09/16/2023 2234   APPEARANCEUR Clear 12/20/2014 1542   LABSPEC 1.019 09/16/2023 2234   LABSPEC 1.020 12/20/2014 1542   PHURINE 5.0 09/16/2023 2234   GLUCOSEU NEGATIVE 09/16/2023 2234   GLUCOSEU NEGATIVE 01/31/2023 1611   HGBUR SMALL (A) 09/16/2023 2234   HGBUR trace-intact 01/06/2008 1523   BILIRUBINUR NEGATIVE 09/16/2023 2234   BILIRUBINUR negative 09/05/2017 1014   BILIRUBINUR Negative 12/20/2014 1542   KETONESUR NEGATIVE 09/16/2023 2234   PROTEINUR NEGATIVE 09/16/2023 2234   UROBILINOGEN 0.2 01/31/2023 1611   NITRITE NEGATIVE 09/16/2023 2234   LEUKOCYTESUR LARGE (A) 09/16/2023 2234   LEUKOCYTESUR Trace 12/20/2014 1542    Radiological Exams on Admission: CT ABDOMEN PELVIS WO CONTRAST  Result Date: 09/16/2023 CLINICAL DATA:  Abdominal pain, nausea/vomiting,  right red blood per rectum EXAM: CT ABDOMEN AND PELVIS WITHOUT CONTRAST TECHNIQUE: Multidetector CT imaging of the abdomen and pelvis was performed following the standard protocol without IV contrast. RADIATION DOSE REDUCTION: This exam was performed according to the departmental dose-optimization program which includes automated exposure control, adjustment of the mA and/or kV according to patient size and/or use of iterative reconstruction technique. COMPARISON:  CTA abdomen/pelvis dated 11/29/2021 FINDINGS: Lower chest: Lung bases are clear. Hepatobiliary: Unenhanced liver is unremarkable. Suspected small gallstones (series 2/image 21), without associated lytic changes in node pedicle sclerotic dilatation. Pancreas: Within normal limits. Spleen: Within normal limits. Adrenals/Urinary Tract: Adrenal glands are within normal limits. Kidneys are within normal. No renal, ureteral, or bladder calculi. No hydronephrosis Bladder is underdistended. Stomach/Bowel: Stomach is within normal limits. No evidence of bowel obstruction. Appendix is not discretely visualized. Diffuse wall thickening with mild pericolonic inflammatory changes involving the left colon, suggesting infectious/inflammatory colitis. Vascular/Lymphatic: No evidence of abdominal aortic aneurysm. Atherosclerotic calcifications of the abdominal aorta and branch vessels. No suspicious abdominopelvic lymphadenopathy. Reproductive: Status post hysterectomy. No adnexal masses. Other: No abdominopelvic ascites. Musculoskeletal:  Mild degenerative changes of the lower thoracic spine. IMPRESSION: Infectious/inflammatory colitis involving the left colon. Suspected small gallstones. Electronically Signed   By: Charline Bills M.D.   On: 09/16/2023 23:55    EKG: Independently reviewed. Sinus rhythm with first-degree AV block, T wave inversions anterolaterally. No significant change since prior tracing.   Assessment and Plan  Infectious or inflammatory colitis  involving the left colon Patient presenting with complaints of left lower quadrant abdominal pain, nausea, vomiting, and bloody diarrhea x 1 day. Hemodynamically stable, afebrile.  WBC 14.0, hemoglobin 12.8, lactate normal x 2. CT abdomen pelvis showing infectious or inflammatory colitis involving the left colon.  GI consulted.  Continue ceftriaxone and metronidazole.  Continue IV fluid hydration, pain management, and antiemetic as needed.  Clear liquid diet ordered as she is no longer vomiting.  Hold aspirin.  Monitor CBC.  C. difficile PCR and GI pathogen panel ordered, enteric precautions.   UTI UA with evidence of pyuria and bacteriuria.  Continue ceftriaxone.  Urine culture added on.   AKI on CKD stage IIIb Likely prerenal in etiology.  BUN 39, creatinine 1.8 (baseline 1.3-1.5). Continue IV fluid hydration and monitor renal function.  Avoid nephrotoxic agents/hold home lisinopril.   Borderline hyperkalemia Likely due to home ACE inhibitor use and AKI.  Hold home lisinopril and monitor labs.   Insulin-dependent diabetes A1c 6.9 on 01/31/2023, repeat ordered.  Continue Toujeo 45 units daily.  Sensitive sliding scale insulin ACHS.   GERD Continue Protonix.   Hypertension Blood pressure stable.  Continue metoprolol.   Hyperlipidemia Continue Crestor.   Anxiety and depression Continue trazodone and vortioxetine.   Hypothyroidism Continue Synthroid.   Asthma Stable, no signs of acute exacerbation.   History of SVT Continue metoprolol.   Chronic pain/neuropathy Continue gabapentin.  DVT prophylaxis: SCDs Code Status: Full Code (discussed with the patient) Consults called: GI Level of care: Telemetry bed Admission status: It is my clinical opinion that admission to INPATIENT is reasonable and necessary because of the expectation that this patient will require hospital care that crosses at least 2 midnights to treat this condition based on the medical complexity of the problems  presented.  Given the aforementioned information, the predictability of an adverse outcome is felt to be significant.  John Giovanni MD [facility]  If 7PM-7AM, please contact night-coverage [email or web address]  09/17/2023, 7:21 AM

## 2023-09-17 NOTE — Consult Note (Signed)
CONSULT NOTE FOR [facility]  Reason for Consult: Colitis and hematochezia Referring Physician: [facility]  [name] HPI: This is a 73 year old female with a PMH of asthma, diabetes, GERD, HTN, and colitis is admitted with hematochezia and colitis.  The patient states that she was in her usual state of health until yesterday morning.  Acutely she started to experience multiple diarrheal bowel movements that progressed into bloody bowel movements.  At first it started with LLQ abdominal pain and it was associated with nausea and vomiting.  Shortly afterwards she started to have problems with the bloody diarrhea.  Her symptoms continued to progress and she presented to the ER.  Her admission labs show stability with her HGB in the 12 g/dL range and she was hemodynamically stable.  Her creatinine did increase mildly up to 1.7.  The lactic acid was within normal limits.  She does not report any sick contacts, travel, or use of any new medications.  There was an equivocal history of eating some chicken.  The patient did have a very similar episode in 10/2021 and she only in the ER.  Both CT scans show a left sided colitis and it was read as an infectious or inflammatory colitis.  During the first presentation she was treated with Augmentin, but she suffered with diarrhea after being on the medication.  Work up by her PCP, Dr. Sanda Linger, did not yield any pathology.  There were no further notes from 11/2021.  In the ER she had an additional 10 bloody bowel movement, but currently she is feeling well.  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 Nicholas Lose   CESAREAN SECTION  1978   FOOT SURGERY Bilateral    NASAL SINUS SURGERY     OOPHORECTOMY     PARTIAL COLECTOMY     -RSO   ROTATOR CUFF REPAIR Left    TOTAL KNEE ARTHROPLASTY Bilateral 2002, 2004    Family History  Problem Relation Age of Onset   COPD Mother    Diabetes Mother    Heart attack Father 53   Hypertension Father    Heart attack Sister 55   Diabetes Sister    Hypertension Sister    Diabetes Brother    Hypertension Sister    Diabetes Sister    Hyperlipidemia Other        fhx   Hypertension Other        fhx   Cancer Other  ovarian/grandmother   Ovarian cancer Maternal Aunt    Diabetes Maternal Aunt    Stomach cancer Maternal Grandmother    Liver cancer Maternal Grandmother     Social History:  reports that she has never smoked. She has never used smokeless tobacco. She reports that she does not drink alcohol and does not use drugs.  Allergies:  Allergies  Allergen Reactions   Jardiance [Empagliflozin] Other (See Comments)    Vaginal yeast infections   Oxycodone Hcl Anaphylaxis    REACTION: unspecified   Oxycodone-Acetaminophen Anaphylaxis    Patient states that she can regular tylenol without any problems   Sulfamethoxazole     REACTION: rash   Benzodiazepines    Trulicity [Dulaglutide] Other (See Comments)    Upper GI pain   Codeine Phosphate Itching    REACTION: unspecified   Erythromycin Other (See Comments)    Dizzy and sweaty, vomiting, diarrhea    Metformin And Related Diarrhea   Morphine  Sulfate Itching    Tolerated hydromorphone    Tylenol [Acetaminophen] Other (See Comments)    Fatty liver    Medications: Scheduled:  gabapentin  600 mg Oral BID   insulin aspart  0-5 Units Subcutaneous QHS   insulin aspart  0-9 Units Subcutaneous TID WC   insulin glargine-yfgn  45 Units Subcutaneous Daily   levothyroxine  75 mcg Oral Q0600   metoprolol tartrate  12.5 mg Oral BID   metroNIDAZOLE  500 mg Oral Q12H   pantoprazole  40 mg Oral Daily   rosuvastatin  20 mg Oral Daily   traZODone  50 mg Oral QHS   vortioxetine HBr  20 mg Oral Daily   Continuous:  sodium chloride 100 mL/hr at 09/17/23 2010   [START ON 09/18/2023] cefTRIAXone (ROCEPHIN)  IV      Results for orders placed or performed during the hospital encounter of 09/16/23 (from the past 24 hour(s))  Lipase, blood     Status: None   Collection Time: 09/16/23  8:44 PM  Result Value Ref Range   Lipase 35 11 - 51 U/L  Comprehensive metabolic panel     Status: Abnormal   Collection Time: 09/16/23  8:44 PM  Result Value Ref Range   Sodium 135 135 - 145 mmol/L   Potassium 6.1 (H) 3.5 - 5.1 mmol/L   Chloride 104 98 - 111 mmol/L   CO2 21 (L) 22 - 32 mmol/L   Glucose, Bld 164 (H) 70 - 99 mg/dL   BUN 39 (H) 8 - 23 mg/dL   Creatinine, Ser 5.18 (H) 0.44 - 1.00 mg/dL   Calcium 9.1 8.9 - 84.1 mg/dL   Total Protein 7.8 6.5 - 8.1 g/dL   Albumin 4.5 3.5 - 5.0 g/dL   AST 20 15 - 41 U/L   ALT 12 0 - 44 U/L   Alkaline Phosphatase 78 38 - 126 U/L   Total Bilirubin 0.7 0.3 - 1.2 mg/dL   GFR, Estimated 29 (L) >60 mL/min   Anion gap 10 5 - 15  Urinalysis, Routine w reflex microscopic -Urine, Unspecified Source     Status: Abnormal   Collection Time: 09/16/23 10:34 PM  Result Value Ref Range   Color, Urine YELLOW YELLOW   APPearance HAZY (A) CLEAR   Specific Gravity, Urine 1.019 1.005 - 1.030   pH 5.0 5.0 - 8.0   Glucose, UA NEGATIVE NEGATIVE mg/dL   Hgb urine dipstick SMALL (A) NEGATIVE   Bilirubin Urine NEGATIVE NEGATIVE    Ketones, ur NEGATIVE  NEGATIVE mg/dL   Protein, ur NEGATIVE NEGATIVE mg/dL   Nitrite NEGATIVE NEGATIVE   Leukocytes,Ua LARGE (A) NEGATIVE   RBC / HPF 0-5 0 - 5 RBC/hpf   WBC, UA >50 0 - 5 WBC/hpf   Bacteria, UA MANY (A) NONE SEEN   Squamous Epithelial / HPF 0-5 0 - 5 /HPF   Mucus PRESENT    Hyaline Casts, UA PRESENT   Lactic acid, plasma     Status: None   Collection Time: 09/16/23 10:56 PM  Result Value Ref Range   Lactic Acid, Venous 1.8 0.5 - 1.9 mmol/L  Potassium     Status: None   Collection Time: 09/16/23 11:56 PM  Result Value Ref Range   Potassium 5.1 3.5 - 5.1 mmol/L  POC occult blood, ED     Status: Abnormal   Collection Time: 09/16/23 11:59 PM  Result Value Ref Range   Fecal Occult Bld POSITIVE (A) NEGATIVE  Lactic acid, plasma     Status: None   Collection Time: 09/17/23  1:27 AM  Result Value Ref Range   Lactic Acid, Venous 0.7 0.5 - 1.9 mmol/L  CBC with Differential     Status: Abnormal   Collection Time: 09/17/23  1:27 AM  Result Value Ref Range   WBC 14.0 (H) 4.0 - 10.5 K/uL   RBC 4.15 3.87 - 5.11 MIL/uL   Hemoglobin 12.8 12.0 - 15.0 g/dL   HCT 16.1 09.6 - 04.5 %   MCV 95.4 80.0 - 100.0 fL   MCH 30.8 26.0 - 34.0 pg   MCHC 32.3 30.0 - 36.0 g/dL   RDW 40.9 81.1 - 91.4 %   Platelets 161 150 - 400 K/uL   nRBC 0.0 0.0 - 0.2 %   Neutrophils Relative % 72 %   Neutro Abs 10.1 (H) 1.7 - 7.7 K/uL   Lymphocytes Relative 18 %   Lymphs Abs 2.5 0.7 - 4.0 K/uL   Monocytes Relative 9 %   Monocytes Absolute 1.3 (H) 0.1 - 1.0 K/uL   Eosinophils Relative 0 %   Eosinophils Absolute 0.0 0.0 - 0.5 K/uL   Basophils Relative 0 %   Basophils Absolute 0.0 0.0 - 0.1 K/uL   Immature Granulocytes 1 %   Abs Immature Granulocytes 0.09 (H) 0.00 - 0.07 K/uL  C Difficile Quick Screen w PCR reflex     Status: None   Collection Time: 09/17/23  2:18 AM   Specimen: Stool  Result Value Ref Range   C Diff antigen NEGATIVE NEGATIVE   C Diff toxin NEGATIVE NEGATIVE   C Diff  interpretation No C. difficile detected.   CBG monitoring, ED     Status: Abnormal   Collection Time: 09/17/23  7:47 AM  Result Value Ref Range   Glucose-Capillary 105 (H) 70 - 99 mg/dL  HIV Antibody (routine testing w rflx)     Status: None   Collection Time: 09/17/23  8:13 AM  Result Value Ref Range   HIV Screen 4th Generation wRfx Non Reactive Non Reactive  CBC     Status: Abnormal   Collection Time: 09/17/23  8:13 AM  Result Value Ref Range   WBC 13.6 (H) 4.0 - 10.5 K/uL   RBC 4.00 3.87 - 5.11 MIL/uL   Hemoglobin 12.2 12.0 - 15.0 g/dL   HCT 78.2 95.6 - 21.3 %   MCV 96.5 80.0 - 100.0 fL   MCH 30.5 26.0 - 34.0 pg   MCHC 31.6 30.0 - 36.0 g/dL   RDW 12.8  11.5 - 15.5 %   Platelets 149 (L) 150 - 400 K/uL   nRBC 0.0 0.0 - 0.2 %  Basic metabolic panel     Status: Abnormal   Collection Time: 09/17/23  8:13 AM  Result Value Ref Range   Sodium 136 135 - 145 mmol/L   Potassium 4.8 3.5 - 5.1 mmol/L   Chloride 107 98 - 111 mmol/L   CO2 20 (L) 22 - 32 mmol/L   Glucose, Bld 104 (H) 70 - 99 mg/dL   BUN 36 (H) 8 - 23 mg/dL   Creatinine, Ser 1.61 (H) 0.44 - 1.00 mg/dL   Calcium 8.4 (L) 8.9 - 10.3 mg/dL   GFR, Estimated 32 (L) >60 mL/min   Anion gap 9 5 - 15  CBG monitoring, ED     Status: None   Collection Time: 09/17/23 10:16 AM  Result Value Ref Range   Glucose-Capillary 98 70 - 99 mg/dL  CBG monitoring, ED     Status: Abnormal   Collection Time: 09/17/23 12:54 PM  Result Value Ref Range   Glucose-Capillary 112 (H) 70 - 99 mg/dL  CBG monitoring, ED     Status: None   Collection Time: 09/17/23  5:04 PM  Result Value Ref Range   Glucose-Capillary 79 70 - 99 mg/dL     CT ABDOMEN PELVIS WO CONTRAST  Result Date: 09/16/2023 CLINICAL DATA:  Abdominal pain, nausea/vomiting, right red blood per rectum EXAM: CT ABDOMEN AND PELVIS WITHOUT CONTRAST TECHNIQUE: Multidetector CT imaging of the abdomen and pelvis was performed following the standard protocol without IV contrast. RADIATION DOSE  REDUCTION: This exam was performed according to the departmental dose-optimization program which includes automated exposure control, adjustment of the mA and/or kV according to patient size and/or use of iterative reconstruction technique. COMPARISON:  CTA abdomen/pelvis dated 11/29/2021 FINDINGS: Lower chest: Lung bases are clear. Hepatobiliary: Unenhanced liver is unremarkable. Suspected small gallstones (series 2/image 21), without associated lytic changes in node pedicle sclerotic dilatation. Pancreas: Within normal limits. Spleen: Within normal limits. Adrenals/Urinary Tract: Adrenal glands are within normal limits. Kidneys are within normal. No renal, ureteral, or bladder calculi. No hydronephrosis Bladder is underdistended. Stomach/Bowel: Stomach is within normal limits. No evidence of bowel obstruction. Appendix is not discretely visualized. Diffuse wall thickening with mild pericolonic inflammatory changes involving the left colon, suggesting infectious/inflammatory colitis. Vascular/Lymphatic: No evidence of abdominal aortic aneurysm. Atherosclerotic calcifications of the abdominal aorta and branch vessels. No suspicious abdominopelvic lymphadenopathy. Reproductive: Status post hysterectomy. No adnexal masses. Other: No abdominopelvic ascites. Musculoskeletal: Mild degenerative changes of the lower thoracic spine. IMPRESSION: Infectious/inflammatory colitis involving the left colon. Suspected small gallstones. Electronically Signed   By: Charline Bills M.D.   On: 09/16/2023 23:55    ROS:  As stated above in the HPI otherwise negative.  Blood pressure (!) 118/52, pulse 64, temperature (!) 97.5 F (36.4 C), temperature source Oral, resp. rate 16, height 5\' 2"  (1.575 m), weight 113.4 kg, last menstrual period 12/31/1993, SpO2 100%.    PE: Gen: NAD, Alert and Oriented HEENT:  [place]/AT, EOMI Neck: Supple, no LAD Lungs: CTA Bilaterally CV: RRR without M/G/R ABD: Soft, NTND, +BS Ext: No  C/C/E  Assessment/Plan: 1) Recurrent colitis. 2) Hematochezia. 3) LLQ abdominal pain.   She is reporting an improvement.  This is the second episode, and it is in the same distribution.  There is no clear infectious history.  Her last colonoscopy was with Dr. Marina Goodell in 2017 and it was a normal examination.  With  the onset of pain followed by hematochezia, the clinical presentation is consistent with an ischemic colitis.  During the intercurrent period she did not report any problems with hematochezia or abdominal pain, which argues against an inflammatory disorder.  Plan: 1) Continue with ceftriaxone. 2) Consider FFS to confirm or refute ischemic colitis. 3) [facility] will assume care in the AM.  Abrianna Sidman D 09/17/2023, 8:00 PM

## 2023-09-17 NOTE — Progress Notes (Signed)
   Patient seen and examined at bedside, patient admitted after midnight, please see earlier detailed admission note by John Giovanni, MD. Briefly, patient presented abdominal pain and found to have evidence of colitis involving the left colon on CT scan. GI consulted. Empiric antibiotics started.  Subjective: Some mild improvement in abdominal pain.  BP (!) 100/53   Pulse 62   Temp 97.7 F (36.5 C) (Oral)   Resp 17   Ht 5\' 2"  (1.575 m)   Wt 113.4 kg   LMP 12/31/1993   SpO2 96%   BMI 45.73 kg/m   General exam: Appears calm and comfortable Respiratory system: Clear to auscultation. Respiratory effort normal. Cardiovascular system: S1 & S2 heard, RRR. No murmurs, rubs, gallops or clicks. Gastrointestinal system: Abdomen is nondistended, soft and mildly tender in right quadrant. Normal bowel sounds heard. Central nervous system: Alert and oriented. No focal neurological deficits. Musculoskeletal: No calf tenderness Psychiatry: Judgement and insight appear normal. Mood & affect appropriate.   Brief assessment/Plan:  Colitis Unclear if infectious or inflammatory. On-going diarrhea. Ceftriaxone and Flagyl started empirically. GI consulted. -Follow-up GI recommendations -Continue Ceftriaxone/Flagyl -Follow-up GI pathogen panel and C. Difficile testing  AKI on CKD stage IIIb Baseline creatinine appears to be about 1.4-1.5 from February 2024. Creatinine of 1.87 on admission. Lisinopril held and IV fluids given. Creatinine improved to 1.7 today. -Continue IV fluids  UTI Urinalysis consistent with infection. Patient is unsure if she has had symptoms secondary to abdominal symptoms. Urine culture obtained on admission. Patient is on Ceftriaxone for colitis. -Follow-up urine culture  Hyperkalemia Resolved. Lisinopril held.  GERD Primary hypertension Hyperlipidemia Anxiety Depression Hypothyroidism Asthma SVT Chronic pain Neuropathy Per H&P  Family communication:  Husband at bedside DVT prophylaxis: SCDs Disposition: Discharge home likely in 2-4 days pending improvement of colitis, transition to oral antibiotics if needed and GI recommendations/management.  Jacquelin Hawking, MD [facility] 09/17/2023, 3:29 PM

## 2023-09-17 NOTE — ED Notes (Signed)
Pt's stool was too watery. Will collect later.

## 2023-09-18 DIAGNOSIS — K529 Noninfective gastroenteritis and colitis, unspecified: Secondary | ICD-10-CM | POA: Diagnosis not present

## 2023-09-18 DIAGNOSIS — R933 Abnormal findings on diagnostic imaging of other parts of digestive tract: Secondary | ICD-10-CM | POA: Diagnosis not present

## 2023-09-18 LAB — GLUCOSE, CAPILLARY
Glucose-Capillary: 61 mg/dL — ABNORMAL LOW (ref 70–99)
Glucose-Capillary: 90 mg/dL (ref 70–99)
Glucose-Capillary: 66 mg/dL — ABNORMAL LOW (ref 70–99)
Glucose-Capillary: 102 mg/dL — ABNORMAL HIGH (ref 70–99)
Glucose-Capillary: 92 mg/dL (ref 70–99)
Glucose-Capillary: 106 mg/dL — ABNORMAL HIGH (ref 70–99)

## 2023-09-18 MED ORDER — SIMETHICONE 80 MG PO CHEW
80.0000 mg | CHEWABLE_TABLET | Freq: Four times a day (QID) | ORAL | Status: DC | PRN
  Administered 2023-09-18 – 2023-09-19 (×3): 80 mg via ORAL
  Filled 2023-09-18 (×3): qty 1

## 2023-09-18 MED ORDER — FLUTICASONE PROPIONATE 50 MCG/ACT NA SUSP
1.0000 | Freq: Every day | NASAL | Status: DC
  Administered 2023-09-18 – 2023-09-20 (×3): 1 via NASAL
  Filled 2023-09-18: qty 16

## 2023-09-18 MED ORDER — CLOBETASOL PROPIONATE 0.05 % EX OINT
1.0000 | TOPICAL_OINTMENT | Freq: Every day | CUTANEOUS | Status: DC
  Administered 2023-09-18 – 2023-09-20 (×3): 1 via TOPICAL
  Filled 2023-09-18: qty 15

## 2023-09-18 MED ORDER — DEXTROSE-SODIUM CHLORIDE 5-0.9 % IV SOLN: INTRAVENOUS | Status: DC

## 2023-09-18 MED ORDER — LORATADINE 10 MG PO TABS
10.0000 mg | ORAL_TABLET | Freq: Every day | ORAL | Status: DC
  Administered 2023-09-18 – 2023-09-20 (×3): 10 mg via ORAL
  Filled 2023-09-18 (×3): qty 1

## 2023-09-18 MED ORDER — DICYCLOMINE HCL 10 MG PO CAPS
10.0000 mg | ORAL_CAPSULE | Freq: Three times a day (TID) | ORAL | Status: DC | PRN
  Administered 2023-09-18 – 2023-09-19 (×3): 10 mg via ORAL
  Filled 2023-09-18 (×2): qty 1

## 2023-09-18 NOTE — Progress Notes (Signed)
PROGRESS NOTE Meredith Hayden  YQI:[id number] DOB: [birth year] DOA: 09/16/2023 PCP: Meredith Grandchild, MD  Brief Narrative/Hospital Course: 73 y.o.f w/ IDDM, GERD, hypertension, hyperlipidemia, IBS, OSA, anxiety, depression, asthma, hypothyroidism, SVT, CKD stage IIIb presented to ED with complaints of abdominal, nausea, vomiting, and bloody diarrhea. In ED-vitals stable labs with leukocytosis AKI hyperkalemia, normal lipase and LFTs, lactic acid normal x 2.  UA with large amount of leukocytes and microscopy showing >50 WBCs and many bacteria CT abdomen pelvis>> infectious or inflammatory colitis involving the left colon. GI was consulted and admitted for further management after starting antibiotics     Subjective: Patient seen and examined  Husband at the bedside Reports she ate her breakfast afterwards having constant left lower quadrant abdominal pain Also having diarrhea with loose stool, no bleeding Denies chest pain fever chills nausea vomiting Overnight patient has been afebrile BP stable saturating on room air Labs hypoglycemia 61 improved to 90 earlier this morning, creatinine down to 1.4 WBC down to 12.3   Assessment and Plan: Principal Problem:   Colitis Active Problems:   Hypothyroidism   Essential hypertension   Diabetes (HCC)   UTI (urinary tract infection)   AKI (acute kidney injury) (HCC)   Recurrent colitis  Hematochezia -Hemoccult positive Left lower quadrant abdominal pain Remote history of sigmoid colon resection/? for  Diverticular disease: Last colonoscopy 2017 was normal, GI following less likely inflammatory bowel disease, continue with empiric antibiotics, ivf, supportive care- possible colonoscopy- [facility] following-defer plan to GI.  Insulin-dependent diabetes mellitus Hypoglycemia transient: PTA on Humalog 14 units Premeal morning and bedtime, Toujeo 50 units daily.  Hold long-acting and Premeal insulin keep on SSI but check cbg q4h. switched  ivf to d5ns Recent Labs  Lab 09/17/23 0813 09/17/23 1016 09/17/23 1254 09/17/23 1704 09/17/23 2156 09/18/23 0742 09/18/23 0840  GLUCAP  --    < > 112* 79 90 61* 90  HGBA1C 6.5*  --   --   --   --   --   --    < > = values in this interval not displayed.    UTI: Follow-up urine culture continue antibiotics as #1.  AKI on CKD stage IIIb: Baseline creatinine 1.5 renal 3024 February on admission 1.8 holding lisinopril, keeping on IV fluid hydration Recent Labs    01/31/23 1611 09/16/23 2044 09/16/23 2356 09/17/23 0813 09/18/23 0444  BUN 25* 39*  --  36* 26*  CREATININE 1.47* 1.87*  --  1.70* 1.45*  CO2 28 21*  --  20* 21*  K 4.7 6.1* 5.1 4.8 4.3    Hyperkalemia: Resolved lisinopril on hold.  GERD: Continue PPI  SVT Primary hypertension: Blood pressure controlled on metoprolol 12.5 twice daily  Hyperlipidemia: Continue Crestor 20 mg  Chronic pain Neuropathy Anxiety Depression: Her mood is stable and appears pleasant.  Continue trazodone, Neurontin Trintellix  Hypothyroidism: Continue home Synthroid  Asthma: Prn nebs  Morbid  Obesity:Patient's Body mass index is 45.73 kg/m. : Will benefit with PCP follow-up, weight loss  healthy lifestyle and outpatient sleep evaluation.  DVT prophylaxis: SCDs Start: 09/17/23 0717 Code Status:   Code Status: Full Code Family Communication: plan of care discussed with patient/husband at bedside. Patient status is: Inpatient because of colitis Level of care: Telemetry   Dispo: The patient is from: Home            Anticipated disposition: TBD  Objective: Vitals last 24 hrs: Vitals:   09/17/23 1741 09/17/23 2024 09/18/23 0222 09/18/23 3875  BP: (!) 118/52 113/60 (!) 127/51 (!) 146/58  Pulse: 64 64 62 69  Resp: 16 18 18 18   Temp: (!) 97.5 F (36.4 C) 98 F (36.7 C) 98.5 F (36.9 C) 98.1 F (36.7 C)  TempSrc: Oral Oral Oral Oral  SpO2: 100% 99% 97% 98%  Weight:      Height:       Weight change:   Physical  Examination: General exam: alert awake, pleasant HEENT:Oral mucosa moist, Ear/Nose WNL grossly Respiratory system: bilaterally CLEAR BS, no use of accessory muscle Cardiovascular system: S1 & S2 +, No JVD. Gastrointestinal system: Abdomen soft, tender left lower quadrant OBESE ABDOMEN, ,ND, BS+ Nervous System:Alert, awake, moving extremities. Extremities: LE edema NEG ,distal peripheral pulses palpable.  Skin: No rashes,no icterus. MSK: Normal muscle bulk,tone, power  Medications reviewed:  Scheduled Meds:  gabapentin  600 mg Oral BID   insulin aspart  0-5 Units Subcutaneous QHS   insulin aspart  0-9 Units Subcutaneous TID WC   levothyroxine  75 mcg Oral Q0600   metoprolol tartrate  12.5 mg Oral BID   metroNIDAZOLE  500 mg Oral Q12H   pantoprazole  40 mg Oral Daily   rosuvastatin  20 mg Oral Daily   traZODone  50 mg Oral QHS   vortioxetine HBr  20 mg Oral Daily   Continuous Infusions:  cefTRIAXone (ROCEPHIN)  IV 2 g (09/18/23 0935)   dextrose 5 % and 0.9 % NaCl 75 mL/hr at 09/18/23 1129    Diet Order             Diet clear liquid Room service appropriate? Yes; Fluid consistency: Thin  Diet effective now                   Intake/Output Summary (Last 24 hours) at 09/18/2023 1140 Last data filed at 09/18/2023 1000 Gross per 24 hour  Intake 3373.33 ml  Output 1850 ml  Net 1523.33 ml   Net IO Since Admission: 1,523.33 mL [09/18/23 1140]  Wt Readings from Last 3 Encounters:  09/16/23 113.4 kg  08/13/23 113.4 kg  04/09/23 113.4 kg     Unresulted Labs (From admission, onward)     Start     Ordered   09/19/23 0500  CBC  Daily,   R      09/18/23 0937   09/19/23 0500  Basic metabolic panel  Daily,   R      09/18/23 0937   09/16/23 2259  Occult blood card to lab, stool  Once,   URGENT        09/16/23 2259          Data Reviewed: I have personally reviewed following labs and imaging studies CBC: Recent Labs  Lab 09/17/23 0127 09/17/23 0813 09/18/23 0444   WBC 14.0* 13.6* 12.3*  NEUTROABS 10.1*  --   --   HGB 12.8 12.2 11.8*  HCT 39.6 38.6 37.6  MCV 95.4 96.5 97.7  PLT 161 149* 152   Basic Metabolic Panel: Recent Labs  Lab 09/16/23 2044 09/16/23 2356 09/17/23 0813 09/18/23 0444  NA 135  --  136 137  K 6.1* 5.1 4.8 4.3  CL 104  --  107 110  CO2 21*  --  20* 21*  GLUCOSE 164*  --  104* 73  BUN 39*  --  36* 26*  CREATININE 1.87*  --  1.70* 1.45*  CALCIUM 9.1  --  8.4* 8.2*   GFR: Estimated Creatinine Clearance: 43 mL/min (A) (by C-G formula  based on SCr of 1.45 mg/dL (H)). Liver Function Tests: Recent Labs  Lab 09/16/23 2044 09/18/23 0444  AST 20 21  ALT 12 11  ALKPHOS 78 59  BILITOT 0.7 0.7  PROT 7.8 6.3*  ALBUMIN 4.5 3.7   Recent Labs  Lab 09/16/23 2044  LIPASE 35   Recent Labs    09/17/23 0813  HGBA1C 6.5*   CBG: Recent Labs  Lab 09/17/23 1254 09/17/23 1704 09/17/23 2156 09/18/23 0742 09/18/23 0840  GLUCAP 112* 79 90 61* 90   Recent Labs  Lab 09/16/23 2256 09/17/23 0127  LATICACIDVEN 1.8 0.7    Recent Results (from the past 240 hour(s))  Urine Culture (for pregnant, neutropenic or urologic patients or patients with an indwelling urinary catheter)     Status: Abnormal (Preliminary result)   Collection Time: 09/16/23  7:57 AM   Specimen: Urine, Clean Catch  Result Value Ref Range Status   Specimen Description   Final    URINE, CLEAN CATCH Performed at [facility], [address], [place], [place] [address]    Special Requests   Final    NONE Performed at [facility], [address], [place], [place] [address]    Culture (A)  Final    >=100,000 COLONIES/mL ESCHERICHIA COLI SUSCEPTIBILITIES TO FOLLOW Performed at [facility], [address] [place], [place] [address]    Report Status PENDING  Incomplete  Gastrointestinal Panel by PCR , Stool     Status: None   Collection Time: 09/17/23  2:18 AM   Specimen: Stool  Result Value Ref Range  Status   Campylobacter species NOT DETECTED NOT DETECTED Final   Plesimonas shigelloides NOT DETECTED NOT DETECTED Final   Salmonella species NOT DETECTED NOT DETECTED Final   Yersinia enterocolitica NOT DETECTED NOT DETECTED Final   Vibrio species NOT DETECTED NOT DETECTED Final   Vibrio cholerae NOT DETECTED NOT DETECTED Final   Enteroaggregative E coli (EAEC) NOT DETECTED NOT DETECTED Final   Enteropathogenic E coli (EPEC) NOT DETECTED NOT DETECTED Final   Enterotoxigenic E coli (ETEC) NOT DETECTED NOT DETECTED Final   Shiga like toxin producing E coli (STEC) NOT DETECTED NOT DETECTED Final   Shigella/Enteroinvasive E coli (EIEC) NOT DETECTED NOT DETECTED Final   Cryptosporidium NOT DETECTED NOT DETECTED Final   Cyclospora cayetanensis NOT DETECTED NOT DETECTED Final   Entamoeba histolytica NOT DETECTED NOT DETECTED Final   Giardia lamblia NOT DETECTED NOT DETECTED Final   Adenovirus F40/41 NOT DETECTED NOT DETECTED Final   Astrovirus NOT DETECTED NOT DETECTED Final   Norovirus GI/GII NOT DETECTED NOT DETECTED Final   Rotavirus A NOT DETECTED NOT DETECTED Final   Sapovirus (I, II, IV, and V) NOT DETECTED NOT DETECTED Final    Comment: Performed at [facility], [address] [place], [place] [address]  C Difficile Quick Screen w PCR reflex     Status: None   Collection Time: 09/17/23  2:18 AM   Specimen: Stool  Result Value Ref Range Status   C Diff antigen NEGATIVE NEGATIVE Final   C Diff toxin NEGATIVE NEGATIVE Final   C Diff interpretation No C. difficile detected.  Final    Comment: Performed at [facility], [address], [place], [place] [address]    Antimicrobials: Anti-infectives (From admission, onward)    Start     Dose/Rate Route Frequency Ordered Stop   09/18/23 1000  cefTRIAXone (ROCEPHIN) 2 g in sodium chloride 0.9 %  100 mL IVPB        2 g 200 mL/hr over 30 Minutes Intravenous Every 24 hours 09/17/23 0719     09/17/23 1400   metroNIDAZOLE (FLAGYL) tablet 500 mg        500 mg Oral Every 12 hours 09/17/23 0719     09/17/23 1200  cefTRIAXone (ROCEPHIN) 1 g in sodium chloride 0.9 % 100 mL IVPB        1 g 200 mL/hr over 30 Minutes Intravenous  Once 09/17/23 0719 09/17/23 1319   09/17/23 0230  metroNIDAZOLE (FLAGYL) tablet 500 mg        500 mg Oral  Once 09/17/23 0216 09/17/23 0337   09/17/23 0000  cefTRIAXone (ROCEPHIN) 1 g in sodium chloride 0.9 % 100 mL IVPB        1 g 200 mL/hr over 30 Minutes Intravenous  Once 09/16/23 2354 09/17/23 0207      Culture/Microbiology    Component Value Date/Time   SDES  09/16/2023 0757    URINE, CLEAN CATCH Performed at [facility], [address], [place], [place] [address]    SPECREQUEST  09/16/2023 0757    NONE Performed at [facility], [address], [place], [place] [address]    CULT (A) 09/16/2023 0757    >=100,000 COLONIES/mL ESCHERICHIA COLI SUSCEPTIBILITIES TO FOLLOW Performed at [facility], [address] [place], [place] [address]    REPTSTATUS PENDING 09/16/2023 0757    Radiology Studies: CT ABDOMEN PELVIS WO CONTRAST  Result Date: 09/16/2023 CLINICAL DATA:  Abdominal pain, nausea/vomiting, right red blood per rectum EXAM: CT ABDOMEN AND PELVIS WITHOUT CONTRAST TECHNIQUE: Multidetector CT imaging of the abdomen and pelvis was performed following the standard protocol without IV contrast. RADIATION DOSE REDUCTION: This exam was performed according to the departmental dose-optimization program which includes automated exposure control, adjustment of the mA and/or kV according to patient size and/or use of iterative reconstruction technique. COMPARISON:  CTA abdomen/pelvis dated 11/29/2021 FINDINGS: Lower chest: Lung bases are clear. Hepatobiliary: Unenhanced liver is unremarkable. Suspected small gallstones (series 2/image 21), without associated lytic changes in node pedicle sclerotic dilatation. Pancreas: Within  normal limits. Spleen: Within normal limits. Adrenals/Urinary Tract: Adrenal glands are within normal limits. Kidneys are within normal. No renal, ureteral, or bladder calculi. No hydronephrosis Bladder is underdistended. Stomach/Bowel: Stomach is within normal limits. No evidence of bowel obstruction. Appendix is not discretely visualized. Diffuse wall thickening with mild pericolonic inflammatory changes involving the left colon, suggesting infectious/inflammatory colitis. Vascular/Lymphatic: No evidence of abdominal aortic aneurysm. Atherosclerotic calcifications of the abdominal aorta and branch vessels. No suspicious abdominopelvic lymphadenopathy. Reproductive: Status post hysterectomy. No adnexal masses. Other: No abdominopelvic ascites. Musculoskeletal: Mild degenerative changes of the lower thoracic spine. IMPRESSION: Infectious/inflammatory colitis involving the left colon. Suspected small gallstones. Electronically Signed   By: Charline Bills M.D.   On: 09/16/2023 23:55     LOS: 1 day   Lanae Boast, MD [facility]  09/18/2023, 11:40 AM

## 2023-09-18 NOTE — Hospital Course (Addendum)
73 y.o.f w/ IDDM, GERD, hypertension, hyperlipidemia, IBS, OSA, anxiety, depression, asthma, hypothyroidism, SVT, CKD stage IIIb presented to ED with complaints of abdominal, nausea, vomiting, and bloody diarrhea. In ED-vitals stable labs with leukocytosis AKI hyperkalemia, normal lipase and LFTs, lactic acid normal x 2.  UA with large amount of leukocytes and microscopy showing >50 WBCs and many bacteria CT abdomen pelvis>> infectious or inflammatory colitis involving the left colon. GI was consulted and admitted for further management and started on antibiotics  GI has seen the patient.  Managing conservatively with IV antibiotics and planning for outpatient colonoscopy/sigmoidoscopy once acute issue resolves.  No more bleeding noticed, diet was slowly advanced at this time tolerating soft diet she had hypoglycemia likely in the setting of insulin with n.p.o. status, managed with IV dextrose and has been weaned off.  She needs to discuss with her PCP regarding returning back on her insulin as sugar remains controlled without insulin regimen at this time.  Instructed to continue sliding scale insulin appropriate instruction provided explained risk of hypoglycemia-

## 2023-09-18 NOTE — Progress Notes (Addendum)
Daily Progress Note  DOA: 09/16/2023 Hospital Day: 3  Chief Complaint: LLQ pain, N/V, bloody diarrhea and colitis on CT scan   Primary GI :  Meredith Flemings, MD   ASSESSMENT & PLAN   Brief Narrative:  Meredith Hayden is a 73 y.o. year old female with a history of  IBS, remote partial colectomy ( ? For diverticular disease), GERD, HTN, DM, asthma,. See PMH for additional history. Admitted 9/17 for diarrhea with blood. Dr. Elnoria Howard did GI consult, we are assuming care today.   Abdominal pain, N/V,  bloody diarrhea, fever, and leukocytosis (14K)  with non-contrast CT scan showing left sided colitis. She has a similar symptoms and similar CTA findings in 2022 but has been fine in between these two episodes. Infectious colitis not excluded though C-diff and GI path panel are negative. IBD seems unlikely given abrupt onset. Could this be ischemic?  She is afebrile, hemodynamically stable. WBC improved to 12.3 on Rocephin and Flagyl. Hgb stable overnight but with overall 1 gram since admission ( 12.8 >> 11.8). Still with LLQ pain but otherwise symptoms resolving.  --Keep on clear liquids today. --Continue antibiotics  --Consider outpatient flex sigmoidoscopy or colonoscopy after acute symptoms have fully resolved.   Remote history of sigmoid colon resection  (? for diverticular disease)    Obesity / DM2, recently started Ozempic, last dose was 6 days ago  History of QT prolongation  CKD stage 3b.  Creatinine stable at 1.45, GFR 39   Attending Physician Note   I have taken an interval history, reviewed the chart and examined the patient. I performed a substantive portion of this encounter, including complete performance of at least one of the key components, in conjunction with the APP. I agree with the APP's note, impression and recommendations with my edits. My additional impressions and recommendations are as follows.   Suspected ischemic colitis, symptoms improving. Continue IV  antibiotics for now. Advance diet as tolerated. Maintain adequate hydration long term. OK for discharge when pain controlled and tolerating a soft diet. Consider outpatient flex sigmoidoscopy or colonoscopy after acute symptoms have fully resolved. Defer to primary GI at outpatient follow up.   Meredith Head, MD [facility] See AM[facility], [facility], for our on call provider     Subjective   Minimal passage of blood this am. No diarrhea / stools. Still has moderate amount of LLQ pain. No further N/V.    Objective   Previous GI studies:  Nov 2017 Screening colonoscopy + EGD - The entire examined colon is normal on direct and retroflexion views. Evidence for prior sigmoid resection. - No specimens collected.  EGD 1. GERD  2. Benign esophageal stricture status post dilation 56 [address] [place] 3. Antral erosions. Status post biopsy. 4. Otherwise normal exam.   Recent Labs    09/17/23 0127 09/17/23 0813 09/18/23 0444  WBC 14.0* 13.6* 12.3*  HGB 12.8 12.2 11.8*  HCT 39.6 38.6 37.6  PLT 161 149* 152   BMET Recent Labs    09/16/23 2044 09/16/23 2356 09/17/23 0813 09/18/23 0444  NA 135  --  136 137  K 6.1* 5.1 4.8 4.3  CL 104  --  107 110  CO2 21*  --  20* 21*  GLUCOSE 164*  --  104* 73  BUN 39*  --  36* 26*  CREATININE 1.87*  --  1.70* 1.45*  CALCIUM 9.1  --  8.4* 8.2*   LFT Recent Labs    09/18/23 0444  PROT 6.3*  ALBUMIN 3.7  AST 21  ALT 11  ALKPHOS 59  BILITOT 0.7   PT/INR No results for input(s): "LABPROT", "INR" in the last 72 hours.   Imaging:  CT ABDOMEN PELVIS WO CONTRAST CLINICAL DATA:  Abdominal pain, nausea/vomiting, right red blood per rectum  EXAM: CT ABDOMEN AND PELVIS WITHOUT CONTRAST  TECHNIQUE: Multidetector CT imaging of the abdomen and pelvis was performed following the standard protocol without IV contrast.  RADIATION DOSE REDUCTION: This exam was performed according to the departmental dose-optimization program which includes automated exposure  control, adjustment of the mA and/or kV according to patient size and/or use of iterative reconstruction technique.  COMPARISON:  CTA abdomen/pelvis dated 11/29/2021  FINDINGS: Lower chest: Lung bases are clear.  Hepatobiliary: Unenhanced liver is unremarkable.  Suspected small gallstones (series 2/image 21), without associated lytic changes in node pedicle sclerotic dilatation.  Pancreas: Within normal limits.  Spleen: Within normal limits.  Adrenals/Urinary Tract: Adrenal glands are within normal limits.  Kidneys are within normal. No renal, ureteral, or bladder calculi. No hydronephrosis  Bladder is underdistended.  Stomach/Bowel: Stomach is within normal limits.  No evidence of bowel obstruction.  Appendix is not discretely visualized.  Diffuse wall thickening with mild pericolonic inflammatory changes involving the left colon, suggesting infectious/inflammatory colitis.  Vascular/Lymphatic: No evidence of abdominal aortic aneurysm.  Atherosclerotic calcifications of the abdominal aorta and branch vessels.  No suspicious abdominopelvic lymphadenopathy.  Reproductive: Status post hysterectomy.  No adnexal masses.  Other: No abdominopelvic ascites.  Musculoskeletal: Mild degenerative changes of the lower thoracic spine.  IMPRESSION: Infectious/inflammatory colitis involving the left colon.  Suspected small gallstones.  Electronically Signed   By: Charline Bills M.D.   On: 09/16/2023 23:55     Scheduled inpatient medications:   gabapentin  600 mg Oral BID   insulin aspart  0-5 Units Subcutaneous QHS   insulin aspart  0-9 Units Subcutaneous TID WC   levothyroxine  75 mcg Oral Q0600   metoprolol tartrate  12.5 mg Oral BID   metroNIDAZOLE  500 mg Oral Q12H   pantoprazole  40 mg Oral Daily   rosuvastatin  20 mg Oral Daily   traZODone  50 mg Oral QHS   vortioxetine HBr  20 mg Oral Daily   Continuous inpatient infusions:   cefTRIAXone (ROCEPHIN)   IV 2 g (09/18/23 0935)   dextrose 5 % and 0.9 % NaCl     PRN inpatient medications: fentaNYL (SUBLIMAZE) injection, naLOXone (NARCAN)  injection, ondansetron (ZOFRAN) IV, simethicone  Vital signs in last 24 hours: Temp:  [97.5 F (36.4 C)-98.5 F (36.9 C)] 98.1 F (36.7 C) (09/18 0437) Pulse Rate:  [62-72] 69 (09/18 0437) Resp:  [16-21] 18 (09/18 0437) BP: (96-146)/(51-67) 146/58 (09/18 0437) SpO2:  [95 %-100 %] 98 % (09/18 0437) Last BM Date : 09/18/23  Intake/Output Summary (Last 24 hours) at 09/18/2023 1009 Last data filed at 09/18/2023 0600 Gross per 24 hour  Intake 3373.33 ml  Output 1550 ml  Net 1823.33 ml    Intake/Output from previous day: 09/17 0701 - 09/18 0700 In: 3373.3 [P.O.:960; I.V.:2313.3; IV Piggyback:100] Out: 1550 [Urine:1550] Intake/Output this shift: No intake/output data recorded.   Physical Exam:  General: Alert female in NAD Heart:  Regular rate and rhythm.  Pulmonary: Normal respiratory effort Abdomen: Soft, nondistended, moderate LLQ tenderness.  Normal bowel sounds. Extremities: No lower extremity edema  Neurologic: Alert and oriented Psych: Pleasant. Cooperative. Insight appears normal.    Principal Problem:   Colitis Active  Problems:   Hypothyroidism   Essential hypertension   Diabetes (HCC)   UTI (urinary tract infection)   AKI (acute kidney injury) (HCC)     LOS: 1 day   Willette Cluster ,NP 09/18/2023, 10:09 AM

## 2023-09-18 NOTE — Plan of Care (Signed)
  Problem: Skin Integrity: Goal: Risk for impaired skin integrity will decrease Outcome: Progressing   Problem: Clinical Measurements: Goal: Diagnostic test results will improve Outcome: Progressing   Problem: Activity: Goal: Risk for activity intolerance will decrease Outcome: Completed/Met

## 2023-09-19 DIAGNOSIS — R933 Abnormal findings on diagnostic imaging of other parts of digestive tract: Secondary | ICD-10-CM | POA: Diagnosis not present

## 2023-09-19 DIAGNOSIS — K529 Noninfective gastroenteritis and colitis, unspecified: Secondary | ICD-10-CM | POA: Diagnosis not present

## 2023-09-19 LAB — CBC
HCT: 33.6 % — ABNORMAL LOW (ref 36.0–46.0)
Hemoglobin: 10.7 g/dL — ABNORMAL LOW (ref 12.0–15.0)
MCH: 30.6 pg (ref 26.0–34.0)
MCHC: 31.8 g/dL (ref 30.0–36.0)
MCV: 96 fL (ref 80.0–100.0)
Platelets: 139 10*3/uL — ABNORMAL LOW (ref 150–400)
RBC: 3.5 MIL/uL — ABNORMAL LOW (ref 3.87–5.11)
RDW: 12.5 % (ref 11.5–15.5)
WBC: 8.2 10*3/uL (ref 4.0–10.5)
nRBC: 0 % (ref 0.0–0.2)

## 2023-09-19 LAB — URINE CULTURE: Culture: >=100000 — AB

## 2023-09-19 LAB — BASIC METABOLIC PANEL
Anion gap: 7 (ref 5–15)
BUN: 16 mg/dL (ref 8–23)
CO2: 21 mmol/L — ABNORMAL LOW (ref 22–32)
Calcium: 8.2 mg/dL — ABNORMAL LOW (ref 8.9–10.3)
Chloride: 111 mmol/L (ref 98–111)
Creatinine, Ser: 1.39 mg/dL — ABNORMAL HIGH (ref 0.44–1.00)
GFR, Estimated: 41 mL/min — ABNORMAL LOW (ref >60)
Glucose, Bld: 101 mg/dL — ABNORMAL HIGH (ref 70–99)
Potassium: 4.2 mmol/L (ref 3.5–5.1)
Sodium: 139 mmol/L (ref 135–145)

## 2023-09-19 LAB — GLUCOSE, CAPILLARY
Glucose-Capillary: 102 mg/dL — ABNORMAL HIGH (ref 70–99)
Glucose-Capillary: 122 mg/dL — ABNORMAL HIGH (ref 70–99)
Glucose-Capillary: 126 mg/dL — ABNORMAL HIGH (ref 70–99)
Glucose-Capillary: 128 mg/dL — ABNORMAL HIGH (ref 70–99)

## 2023-09-19 MED ORDER — INFLUENZA VAC A&B SURF ANT ADJ 0.5 ML IM SUSY
0.5000 mL | PREFILLED_SYRINGE | INTRAMUSCULAR | Status: AC
  Administered 2023-09-20: 0.5 mL via INTRAMUSCULAR
  Filled 2023-09-19: qty 0.5

## 2023-09-19 NOTE — Plan of Care (Signed)
  Problem: Education: Goal: Ability to describe self-care measures that may prevent or decrease complications (Diabetes Survival Skills Education) will improve Outcome: Progressing Goal: Individualized Educational Video(s) Outcome: Progressing   

## 2023-09-19 NOTE — Progress Notes (Signed)
PROGRESS NOTE Meredith Hayden  HKV:[id number] DOB: 1950/10/13 DOA: 09/16/2023 PCP: Etta Grandchild, MD  Brief Narrative/Hospital Course: 73 y.o.f w/ IDDM, GERD, hypertension, hyperlipidemia, IBS, OSA, anxiety, depression, asthma, hypothyroidism, SVT, CKD stage IIIb presented to ED with complaints of abdominal, nausea, vomiting, and bloody diarrhea. In ED-vitals stable labs with leukocytosis AKI hyperkalemia, normal lipase and LFTs, lactic acid normal x 2.  UA with large amount of leukocytes and microscopy showing >50 WBCs and many bacteria CT abdomen pelvis>> infectious or inflammatory colitis involving the left colon. GI was consulted and admitted for further management and started on antibiotics  GI has seen the patient     Subjective: Patient seen and examined this morning resting comfortably on the bedside chair  Stool improving She did not try clear liquid diet yesterday since she had pain after breakfast, waiting for her clear liquid diet. She feels overall much better Overnight she has been afebrile, blood sugar stable creatinine down to 1.3  Assessment and Plan: Principal Problem:   Colitis Active Problems:   Hypothyroidism   Essential hypertension   Diabetes (HCC)   UTI (urinary tract infection)   AKI (acute kidney injury) (HCC)   Abnormal CT scan, colon   Recurrent colitis  Hematochezia -Hemoccult positive Left lower quadrant abdominal pain Remote history of sigmoid colon resection/? for  Diverticular disease: Last colonoscopy 2017 was normal, GI following less likely inflammatory bowel disease. GI panel and C. difficile negative GI has seen the patient advised antibiotics and to consider outpatient flexible sigmoidoscopy or colonoscopy after acute symptoms have fully resolved.  Patient has been newly IV opiates for pain control> on clear liquid diet if tolerating today with advance to soft diet and possible discharge in the morning , wean off ivf once able to take  p.o.   Insulin-dependent diabetes mellitus Hypoglycemia transient: PTA on Humalog 14 units Premeal morning and bedtime, Toujeo 50 units daily.  Due to hypoglycemia insulin has been discontinued placed on D5 blood sugar fairly stable on D5.  Monitor  Recent Labs  Lab 09/17/23 0813 09/17/23 1016 09/18/23 1147 09/18/23 1307 09/18/23 1620 09/18/23 2157 09/19/23 0732  GLUCAP  --    < > 66* 102* 92 106* 102*  HGBA1C 6.5*  --   --   --   --   --   --    < > = values in this interval not displayed.    E coli UTI: Sensitive to cefazolin/ceftriaxone resistant to Bactrim and Cipro.  Continue ceftriaxone.    AKI on CKD stage IIIb: Baseline creatinine 1.5 in February, peaked to 1.8 now downtrending keep on IV fluids.  Continue to monitor Recent Labs    01/31/23 1611 09/16/23 2044 09/16/23 2356 09/17/23 0813 09/18/23 0444 09/19/23 0509  BUN 25* 39*  --  36* 26* 16  CREATININE 1.47* 1.87*  --  1.70* 1.45* 1.39*  CO2 28 21*  --  20* 21* 21*  K 4.7 6.1* 5.1 4.8 4.3 4.2    Hyperkalemia: Resolved, hold lisinopril  GERD: Continue PPI  SVT Primary hypertension: BP and HR stable, Cont metoprolol 12.5 twice daily  Hyperlipidemia: Continue Crestor 20 mg  Chronic pain Neuropathy Anxiety Depression: Her mood is stable and is pleasant. Continue trazodone, Neurontin Trintellix  Hypothyroidism: Continue home Synthroid  Asthma: Not wheezing.  Prn nebs  Morbid  Obesity:Patient's Body mass index is 45.73 kg/m. : Will benefit with PCP follow-up, weight loss  healthy lifestyle and outpatient sleep evaluation.  DVT prophylaxis: SCDs Start: 09/17/23  1914 Code Status:   Code Status: Full Code Family Communication: plan of care discussed with patient/husband at bedside. Patient status is: Inpatient because of colitis Level of care: Telemetry   Dispo: The patient is from: Home            Anticipated disposition: TBD  Objective: Vitals last 24 hrs: Vitals:   09/18/23 0437  09/18/23 1311 09/18/23 2028 09/19/23 0625  BP: (!) 146/58 (!) 100/43 (!) 119/55 (!) 114/51  Pulse: 69 (!) 51 65 69  Resp: 18 18 18 18   Temp: 98.1 F (36.7 C) 98.4 F (36.9 C) 98.2 F (36.8 C) 98.1 F (36.7 C)  TempSrc: Oral Oral Oral Oral  SpO2: 98% 95% 95% 98%  Weight:      Height:       Weight change:   Physical Examination: General exam: alert awake, orientedx3 HEENT:Oral mucosa moist, Ear/Nose WNL grossly Respiratory system: Bilaterally clear BS,no use of accessory muscle Cardiovascular system: S1 & S2 +, No JVD. Gastrointestinal system: Abdomen soft,obese, mildly tender in mid to lower abdomen,ND, BS+ Nervous System: Alert, awake, moving all extremities,and following commands. Extremities: LE edema neg,distal peripheral pulses palpable and warm.  Skin: No rashes,no icterus. MSK: Normal muscle bulk,tone, power   Medications reviewed:  Scheduled Meds:  clobetasol ointment  1 Application Topical Daily   fluticasone  1 spray Each Nare Daily   gabapentin  600 mg Oral BID   insulin aspart  0-5 Units Subcutaneous QHS   insulin aspart  0-9 Units Subcutaneous TID WC   levothyroxine  75 mcg Oral Q0600   loratadine  10 mg Oral Daily   metoprolol tartrate  12.5 mg Oral BID   metroNIDAZOLE  500 mg Oral Q12H   pantoprazole  40 mg Oral Daily   rosuvastatin  20 mg Oral Daily   traZODone  50 mg Oral QHS   vortioxetine HBr  20 mg Oral Daily   Continuous Infusions:  cefTRIAXone (ROCEPHIN)  IV 2 g (09/19/23 0927)   dextrose 5 % and 0.9 % NaCl 75 mL/hr at 09/19/23 0000    Diet Order             DIET SOFT Room service appropriate? Yes; Fluid consistency: Thin  Diet effective now                   Intake/Output Summary (Last 24 hours) at 09/19/2023 1120 Last data filed at 09/19/2023 1000 Gross per 24 hour  Intake 2539.17 ml  Output 2250 ml  Net 289.17 ml   Net IO Since Admission: 1,895.83 mL [09/19/23 1120]  Wt Readings from Last 3 Encounters:  09/16/23 113.4 kg   08/13/23 113.4 kg  04/09/23 113.4 kg     Unresulted Labs (From admission, onward)     Start     Ordered   09/19/23 0500  CBC  Daily,   R      09/18/23 0937   09/19/23 0500  Basic metabolic panel  Daily,   R      09/18/23 0937   09/16/23 2259  Occult blood card to lab, stool  Once,   URGENT        09/16/23 2259          Data Reviewed: I have personally reviewed following labs and imaging studies CBC: Recent Labs  Lab 09/17/23 0127 09/17/23 0813 09/18/23 0444 09/19/23 0509  WBC 14.0* 13.6* 12.3* 8.2  NEUTROABS 10.1*  --   --   --   HGB 12.8 12.2  11.8* 10.7*  HCT 39.6 38.6 37.6 33.6*  MCV 95.4 96.5 97.7 96.0  PLT 161 149* 152 139*   Basic Metabolic Panel: Recent Labs  Lab 09/16/23 2044 09/16/23 2356 09/17/23 0813 09/18/23 0444 09/19/23 0509  NA 135  --  136 137 139  K 6.1* 5.1 4.8 4.3 4.2  CL 104  --  107 110 111  CO2 21*  --  20* 21* 21*  GLUCOSE 164*  --  104* 73 101*  BUN 39*  --  36* 26* 16  CREATININE 1.87*  --  1.70* 1.45* 1.39*  CALCIUM 9.1  --  8.4* 8.2* 8.2*   GFR: Estimated Creatinine Clearance: 44.8 mL/min (A) (by C-G formula based on SCr of 1.39 mg/dL (H)). Liver Function Tests: Recent Labs  Lab 09/16/23 2044 09/18/23 0444  AST 20 21  ALT 12 11  ALKPHOS 78 59  BILITOT 0.7 0.7  PROT 7.8 6.3*  ALBUMIN 4.5 3.7   Recent Labs  Lab 09/16/23 2044  LIPASE 35   Recent Labs    09/17/23 0813  HGBA1C 6.5*   CBG: Recent Labs  Lab 09/18/23 1147 09/18/23 1307 09/18/23 1620 09/18/23 2157 09/19/23 0732  GLUCAP 66* 102* 92 106* 102*   Recent Labs  Lab 09/16/23 2256 09/17/23 0127  LATICACIDVEN 1.8 0.7    Recent Results (from the past 240 hour(s))  Urine Culture (for pregnant, neutropenic or urologic patients or patients with an indwelling urinary catheter)     Status: Abnormal   Collection Time: 09/16/23  7:57 AM   Specimen: Urine, Clean Catch  Result Value Ref Range Status   Specimen Description   Final    URINE, CLEAN  CATCH Performed at [facility], [address] [place], [place] [address]    Special Requests   Final    NONE Performed at [facility], [address], [place], [place] [address]    Culture >=100,000 COLONIES/mL ESCHERICHIA COLI (A)  Final   Report Status 09/19/2023 FINAL  Final   Organism ID, Bacteria ESCHERICHIA COLI (A)  Final      Susceptibility   Escherichia coli - MIC*    AMPICILLIN <=2 SENSITIVE Sensitive     CEFAZOLIN <=4 SENSITIVE Sensitive     CEFEPIME <=0.12 SENSITIVE Sensitive     CEFTRIAXONE <=0.25 SENSITIVE Sensitive     CIPROFLOXACIN >=4 RESISTANT Resistant     GENTAMICIN <=1 SENSITIVE Sensitive     IMIPENEM <=0.25 SENSITIVE Sensitive     NITROFURANTOIN <=16 SENSITIVE Sensitive     TRIMETH/SULFA >=320 RESISTANT Resistant     AMPICILLIN/SULBACTAM <=2 SENSITIVE Sensitive     PIP/TAZO <=4 SENSITIVE Sensitive     * >=100,000 COLONIES/mL ESCHERICHIA COLI  Gastrointestinal Panel by PCR , Stool     Status: None   Collection Time: 09/17/23  2:18 AM   Specimen: Stool  Result Value Ref Range Status   Campylobacter species NOT DETECTED NOT DETECTED Final   Plesimonas shigelloides NOT DETECTED NOT DETECTED Final   Salmonella species NOT DETECTED NOT DETECTED Final   Yersinia enterocolitica NOT DETECTED NOT DETECTED Final   Vibrio species NOT DETECTED NOT DETECTED Final   Vibrio cholerae NOT DETECTED NOT DETECTED Final   Enteroaggregative E coli (EAEC) NOT DETECTED NOT DETECTED Final   Enteropathogenic E coli (EPEC) NOT DETECTED NOT DETECTED Final   Enterotoxigenic E coli (ETEC) NOT DETECTED NOT DETECTED Final   Shiga like toxin producing E coli (STEC) NOT DETECTED NOT DETECTED Final   Shigella/Enteroinvasive  E coli (EIEC) NOT DETECTED NOT DETECTED Final   Cryptosporidium NOT DETECTED NOT DETECTED Final   Cyclospora cayetanensis NOT DETECTED NOT DETECTED Final   Entamoeba histolytica NOT DETECTED NOT DETECTED Final   Giardia lamblia  NOT DETECTED NOT DETECTED Final   Adenovirus F40/41 NOT DETECTED NOT DETECTED Final   Astrovirus NOT DETECTED NOT DETECTED Final   Norovirus GI/GII NOT DETECTED NOT DETECTED Final   Rotavirus A NOT DETECTED NOT DETECTED Final   Sapovirus (I, II, IV, and V) NOT DETECTED NOT DETECTED Final    Comment: Performed at [facility] [address] [place], [place] [address]  C Difficile Quick Screen w PCR reflex     Status: None   Collection Time: 09/17/23  2:18 AM   Specimen: Stool  Result Value Ref Range Status   C Diff antigen NEGATIVE NEGATIVE Final   C Diff toxin NEGATIVE NEGATIVE Final   C Diff interpretation No C. difficile detected.  Final    Comment: Performed at [facility], [address] [place], [place] [address]    Antimicrobials: Anti-infectives (From admission, onward)    Start     Dose/Rate Route Frequency Ordered Stop   09/18/23 1000  cefTRIAXone (ROCEPHIN) 2 g in sodium chloride 0.9 % 100 mL IVPB        2 g 200 mL/hr over 30 Minutes Intravenous Every 24 hours 09/17/23 0719     09/17/23 1400  metroNIDAZOLE (FLAGYL) tablet 500 mg        500 mg Oral Every 12 hours 09/17/23 0719     09/17/23 1200  cefTRIAXone (ROCEPHIN) 1 g in sodium chloride 0.9 % 100 mL IVPB        1 g 200 mL/hr over 30 Minutes Intravenous  Once 09/17/23 0719 09/17/23 1319   09/17/23 0230  metroNIDAZOLE (FLAGYL) tablet 500 mg        500 mg Oral  Once 09/17/23 0216 09/17/23 0337   09/17/23 0000  cefTRIAXone (ROCEPHIN) 1 g in sodium chloride 0.9 % 100 mL IVPB        1 g 200 mL/hr over 30 Minutes Intravenous  Once 09/16/23 2354 09/17/23 0207      Culture/Microbiology    Component Value Date/Time   SDES  09/16/2023 0757    URINE, CLEAN CATCH Performed at [facility], [address] [place], [place] [address]    SPECREQUEST  09/16/2023 0757    NONE Performed at [facility], [address], [place], [place] [address]    CULT  >=100,000 COLONIES/mL ESCHERICHIA COLI (A) 09/16/2023 0757   REPTSTATUS 09/19/2023 FINAL 09/16/2023 0757    Radiology Studies: No results found.   LOS: 2 days   Lanae Boast, MD [facility]  09/19/2023, 11:20 AM

## 2023-09-19 NOTE — Discharge Summary (Signed)
Physician Discharge Summary  Meredith Hayden HYQ:[id number] DOB: 1950-01-31 DOA: 09/16/2023  PCP: Etta Grandchild, MD  Admit date: 09/16/2023 Discharge date: 09/20/2023 Recommendations for Outpatient Follow-up:  Follow up with PCP in 1 weeks-call for appointment Please obtain BMP/CBC in one week   Discharge Dispo: Home Discharge Condition: Stable Code Status:   Code Status: Full Code Diet recommendation:  Diet Order             DIET SOFT Room service appropriate? Yes; Fluid consistency: Thin  Diet effective now                    Brief/Interim Summary: 73 y.o.f w/ IDDM, GERD, hypertension, hyperlipidemia, IBS, OSA, anxiety, depression, asthma, hypothyroidism, SVT, CKD stage IIIb presented to ED with complaints of abdominal, nausea, vomiting, and bloody diarrhea. In ED-vitals stable labs with leukocytosis AKI hyperkalemia, normal lipase and LFTs, lactic acid normal x 2.  UA with large amount of leukocytes and microscopy showing >50 WBCs and many bacteria CT abdomen pelvis>> infectious or inflammatory colitis involving the left colon. GI was consulted and admitted for further management and started on antibiotics  GI has seen the patient.  Managing conservatively with IV antibiotics and planning for outpatient colonoscopy/sigmoidoscopy once acute issue resolves.  No more bleeding noticed, diet was slowly advanced at this time tolerating soft diet she had hypoglycemia likely in the setting of insulin with n.p.o. status, managed with IV dextrose and has been weaned off.  She needs to discuss with her PCP regarding returning back on her insulin as sugar remains controlled without insulin regimen at this time.  Instructed to continue sliding scale insulin appropriate instruction provided explained risk of hypoglycemia-      Discharge Diagnoses:  Principal Problem:   Colitis Active Problems:   Hypothyroidism   Essential hypertension   Diabetes (HCC)   UTI (urinary tract  infection)   AKI (acute kidney injury) (HCC)   Abnormal CT scan, colon Recurrent colitis  Hematochezia -Hemoccult positive Left lower quadrant abdominal pain Remote history of sigmoid colon resection/? for  Diverticular disease: Last colonoscopy 2017 was normal, GI following less likely inflammatory bowel disease. GI panel and C. difficile negative GI has seen the patient advised antibiotics and to consider outpatient flexible sigmoidoscopy or colonoscopy after acute symptoms have fully resolved.  Patient at this time doing well tolerating soft diet okay for discharge home on oral antibiotics and outpatient follow-up with GI.     Insulin-dependent diabetes mellitus Hypoglycemia transient: PTA on Humalog 14 units Premeal morning and bedtime, Toujeo 50 units daily.  Due to hypoglycemia insulin has been discontinued placed on D5 blood sugar fairly stable, without D5, she is on Humalog at home I changed to sliding scale insulin start long-acting insulin for now I advised her to follow-up with PCP, sliding scale insulin instruction provided   E coli UTI: Sensitive to cefazolin/ceftriaxone resistant to Bactrim and Cipro.  Continue omnicef.     AKI on CKD stage IIIb: Baseline creatinine 1.5 in February, peaked to 1.8 now downtrending keep on IV fluids.  Continue to monitor Renal function overall stable follow-up outpatient  Hyperkalemia: Resolved   GERD: Continue PPI   SVT Primary hypertension: BP and HR stable, Cont metoprolol 12.5 twice daily   Hyperlipidemia: Continue Crestor 20 mg   Chronic pain Neuropathy Anxiety Depression: Her mood is stable and is pleasant. Continue trazodone, Neurontin Trintellix   Hypothyroidism: Continue home Synthroid   Asthma: Not wheezing.  Prn nebs   Morbid  Obesity:Patient's Body mass index is 45.73 kg/m. : Will benefit with PCP follow-up, weight loss  healthy lifestyle and outpatient sleep evaluation.    Consults: GI Subjective: Alert  awake oriented resting comfortably did not sleep well, tolerating soft diet, eager to go home today  Discharge Exam: Vitals:   09/19/23 1933 09/20/23 0600  BP: 139/71 (!) 152/64  Pulse: 64 (!) 56  Resp: 16 16  Temp: 98.1 F (36.7 C) 98.4 F (36.9 C)  SpO2: 98% 95%   General: Pt is alert, awake, not in acute distress Cardiovascular: RRR, S1/S2 +, no rubs, no gallops Respiratory: CTA bilaterally, no wheezing, no rhonchi Abdominal: Soft, NT, ND, bowel sounds + Extremities: no edema, no cyanosis  Discharge Instructions  Discharge Instructions     Discharge instructions   Complete by: As directed    Please call call MD or return to ER for similar or worsening recurring problem that brought you to hospital or if any fever,nausea/vomiting,abdominal pain, uncontrolled pain, chest pain,  shortness of breath or any other alarming symptoms.  You will need to follow-up with gastroenterology for outpatient procedure.  Please monitor blood sugar q. ACH S at home and use sliding scale insulin as ordered since the blood sugar remains well-controlled without using long-acting insulin, follow-up with PCP for further discussion.  Please follow-up your doctor as instructed in a week time and call the office for appointment.  Check blood sugar 3 times a day and bedtime at home. If blood sugar running above 200 less than 70 please call your MD to adjust insulin. If blood sugars running less 100 do not use insulin and call MD. If you noticed signs and symptoms of hypoglycemia or low blood sugar like jitteriness, confusion, thirst, tremor, sweating- Check blood sugar, drink sugary drink/biscuits/sweets to increase sugar level and call MD or return to ER.   Please avoid alcohol, smoking, or any other illicit substance and maintain healthy habits including taking your regular medications as prescribed.  You were cared for by a hospitalist during your hospital stay. If you have any questions about your  discharge medications or the care you received while you were in the hospital after you are discharged, you can call the unit and ask to speak with the hospitalist on call if the hospitalist that took care of you is not available.  Once you are discharged, your primary care physician will handle any further medical issues. Please note that NO REFILLS for any discharge medications will be authorized once you are discharged, as it is imperative that you return to your primary care physician (or establish a relationship with a primary care physician if you do not have one) for your aftercare needs so that they can reassess your need for medications and monitor your lab values   Increase activity slowly   Complete by: As directed       Allergies as of 09/20/2023       Reactions   Jardiance [empagliflozin] Other (See Comments)   Vaginal yeast infections   Oxycodone Hcl Anaphylaxis   REACTION: unspecified   Oxycodone-acetaminophen Anaphylaxis   Patient states that she can regular tylenol without any problems   Sulfamethoxazole    REACTION: rash   Benzodiazepines    Trulicity [dulaglutide] Other (See Comments)   Upper GI pain   Codeine Phosphate Itching   REACTION: unspecified   Erythromycin Other (See Comments)   Dizzy and sweaty, vomiting, diarrhea    Metformin And Related Diarrhea   Morphine Sulfate Itching  Tolerated hydromorphone    Tylenol [acetaminophen] Other (See Comments)   Fatty liver        Medication List     STOP taking these medications    Toujeo SoloStar 300 UNIT/ML Solostar Pen Generic drug: insulin glargine (1 Unit Dial)       TAKE these medications    aspirin 81 MG tablet Take 81 mg by mouth daily.   cefdinir 300 MG capsule Commonly known as: OMNICEF Take 1 capsule (300 mg total) by mouth 2 (two) times daily for 7 days.   cetirizine 10 MG tablet Commonly known as: ZYRTEC Take 10 mg by mouth daily.   clobetasol ointment 0.05 % Commonly known as:  TEMOVATE Apply 1 Application topically daily.   dicyclomine 10 MG capsule Commonly known as: BENTYL TAKE 1 CAPSULE BY MOUTH 3 TIMES  DAILY BEFORE MEALS What changed: See the new instructions.   fluticasone 50 MCG/ACT nasal spray Commonly known as: FLONASE Place 1 spray into both nostrils daily.   FreeStyle Libre 2 Reader Philpot 1 Act by Does not apply route daily.   FreeStyle Libre 2 Sensor Misc 1 Act by Does not apply route daily.   gabapentin 600 MG tablet Commonly known as: NEURONTIN Take 600 mg by mouth 2 (two) times daily.   Gvoke HypoPen 2-Pack 1 MG/0.2ML Soaj Generic drug: Glucagon Inject 1 Act into the skin daily as needed.   insulin lispro 100 UNIT/ML KwikPen Commonly known as: HumaLOG KwikPen Inject 0-9 Units into the skin 3 (three) times daily. CBG 70 - 120: 0 units CBG 121 - 150: 1 unit CBG 151 - 200: 2 units CBG 201 - 250: 3 units CBG 251 - 300: 5 units CBG 301 - 350: 7 units CBG 351 - 400: 9 units CBG > 400: call MD What changed: See the new instructions.   ipratropium 17 MCG/ACT inhaler Commonly known as: Atrovent HFA USE 2 PUFFS EVERY 4 HOURS  AS NEEDED FOR WHEEZING.   Kerendia 20 MG Tabs Generic drug: Finerenone TAKE 1 TABLET BY MOUTH DAILY   lisinopril 20 MG tablet Commonly known as: ZESTRIL TAKE 1 TABLET BY MOUTH DAILY   loperamide 2 MG capsule Commonly known as: IMODIUM Take 2 mg by mouth as needed for diarrhea or loose stools.   metoprolol tartrate 25 MG tablet Commonly known as: LOPRESSOR Take 0.5 tablets (12.5 mg total) by mouth 2 (two) times daily.   metroNIDAZOLE 500 MG tablet Commonly known as: Flagyl Take 1 tablet (500 mg total) by mouth 2 (two) times daily for 7 days.   nystatin ointment Commonly known as: MYCOSTATIN Apply 1 Application topically 2 (two) times daily as needed.   omeprazole 20 MG capsule Commonly known as: PRILOSEC Take 20 mg by mouth daily.   OneTouch Delica Plus Lancet33G Misc USE AS DIRECTED TO CHECK BLOOD  SUGAR 4 TIMES DAILY AFTER MEALS AND AT BEDTIME.   OneTouch Verio test strip Generic drug: glucose blood CHECK BLOOD SUGAR 4 TIMES A DAY.   Ozempic (0.25 or 0.5 MG/DOSE) 2 MG/3ML Sopn Generic drug: Semaglutide(0.25 or 0.5MG /DOS) Inject 0.25 mg into the skin once a week.   rosuvastatin 20 MG tablet Commonly known as: CRESTOR TAKE 1 TABLET BY MOUTH DAILY   Simethicone 125 MG Caps Take 1 capsule by mouth daily as needed.   Synthroid 75 MCG tablet Generic drug: levothyroxine TAKE 1 TABLET DAILY BEFORE BREAKFAST FOR HYPOTHYROIDISM   traZODone 50 MG tablet Commonly known as: DESYREL Take 50 mg by mouth at bedtime.  Trintellix 20 MG Tabs tablet Generic drug: vortioxetine HBr Take 20 mg by mouth daily.   Unifine Pentips Plus 33G X 4 MM Misc Generic drug: Insulin Pen Needle Inject 1 Act into the skin 4 (four) times daily.   Vitamin D3 125 MCG (5000 UT) Tbdp Take 5,000 Units by mouth 3 (three) times a week.   zaleplon 10 MG capsule Commonly known as: SONATA Take 10 mg by mouth at bedtime as needed for sleep.        Follow-up Information     Hilarie Fredrickson, MD Follow up on 12/13/2023.   Specialty: Gastroenterology Why: at 11:40 am Contact information: [address] [place] [address] [phone]         Etta Grandchild, MD Follow up in 1 week(s).   Specialty: Internal Medicine Contact information: [address] [place] [address] [phone]                Allergies  Allergen Reactions   Jardiance [Empagliflozin] Other (See Comments)    Vaginal yeast infections   Oxycodone Hcl Anaphylaxis    REACTION: unspecified   Oxycodone-Acetaminophen Anaphylaxis    Patient states that she can regular tylenol without any problems   Sulfamethoxazole     REACTION: rash   Benzodiazepines    Trulicity [Dulaglutide] Other (See Comments)    Upper GI pain   Codeine Phosphate Itching    REACTION: unspecified   Erythromycin Other (See Comments)     Dizzy and sweaty, vomiting, diarrhea    Metformin And Related Diarrhea   Morphine Sulfate Itching    Tolerated hydromorphone    Tylenol [Acetaminophen] Other (See Comments)    Fatty liver    The results of significant diagnostics from this hospitalization (including imaging, microbiology, ancillary and laboratory) are listed below for reference.    Microbiology: Recent Results (from the past 240 hour(s))  Urine Culture (for pregnant, neutropenic or urologic patients or patients with an indwelling urinary catheter)     Status: Abnormal   Collection Time: 09/16/23  7:57 AM   Specimen: Urine, Clean Catch  Result Value Ref Range Status   Specimen Description   Final    URINE, CLEAN CATCH Performed at [facility], [address], [place], [place] [address]    Special Requests   Final    NONE Performed at [facility], [address] [place], [place] [address]    Culture >=100,000 COLONIES/mL ESCHERICHIA COLI (A)  Final   Report Status 09/19/2023 FINAL  Final   Organism ID, Bacteria ESCHERICHIA COLI (A)  Final      Susceptibility   Escherichia coli - MIC*    AMPICILLIN <=2 SENSITIVE Sensitive     CEFAZOLIN <=4 SENSITIVE Sensitive     CEFEPIME <=0.12 SENSITIVE Sensitive     CEFTRIAXONE <=0.25 SENSITIVE Sensitive     CIPROFLOXACIN >=4 RESISTANT Resistant     GENTAMICIN <=1 SENSITIVE Sensitive     IMIPENEM <=0.25 SENSITIVE Sensitive     NITROFURANTOIN <=16 SENSITIVE Sensitive     TRIMETH/SULFA >=320 RESISTANT Resistant     AMPICILLIN/SULBACTAM <=2 SENSITIVE Sensitive     PIP/TAZO <=4 SENSITIVE Sensitive     * >=100,000 COLONIES/mL ESCHERICHIA COLI  Gastrointestinal Panel by PCR , Stool     Status: None   Collection Time: 09/17/23  2:18 AM   Specimen: Stool  Result Value Ref Range Status   Campylobacter species NOT DETECTED NOT DETECTED Final   Plesimonas shigelloides NOT DETECTED  NOT DETECTED Final   Salmonella species NOT DETECTED NOT  DETECTED Final   Yersinia enterocolitica NOT DETECTED NOT DETECTED Final   Vibrio species NOT DETECTED NOT DETECTED Final   Vibrio cholerae NOT DETECTED NOT DETECTED Final   Enteroaggregative E coli (EAEC) NOT DETECTED NOT DETECTED Final   Enteropathogenic E coli (EPEC) NOT DETECTED NOT DETECTED Final   Enterotoxigenic E coli (ETEC) NOT DETECTED NOT DETECTED Final   Shiga like toxin producing E coli (STEC) NOT DETECTED NOT DETECTED Final   Shigella/Enteroinvasive E coli (EIEC) NOT DETECTED NOT DETECTED Final   Cryptosporidium NOT DETECTED NOT DETECTED Final   Cyclospora cayetanensis NOT DETECTED NOT DETECTED Final   Entamoeba histolytica NOT DETECTED NOT DETECTED Final   Giardia lamblia NOT DETECTED NOT DETECTED Final   Adenovirus F40/41 NOT DETECTED NOT DETECTED Final   Astrovirus NOT DETECTED NOT DETECTED Final   Norovirus GI/GII NOT DETECTED NOT DETECTED Final   Rotavirus A NOT DETECTED NOT DETECTED Final   Sapovirus (I, II, IV, and V) NOT DETECTED NOT DETECTED Final    Comment: Performed at [facility], [address] [place], [place] [address]  C Difficile Quick Screen w PCR reflex     Status: None   Collection Time: 09/17/23  2:18 AM   Specimen: Stool  Result Value Ref Range Status   C Diff antigen NEGATIVE NEGATIVE Final   C Diff toxin NEGATIVE NEGATIVE Final   C Diff interpretation No C. difficile detected.  Final    Comment: Performed at [facility], [address], [place], [place] [address]    Procedures/Studies: CT ABDOMEN PELVIS WO CONTRAST  Result Date: 09/16/2023 CLINICAL DATA:  Abdominal pain, nausea/vomiting, right red blood per rectum EXAM: CT ABDOMEN AND PELVIS WITHOUT CONTRAST TECHNIQUE: Multidetector CT imaging of the abdomen and pelvis was performed following the standard protocol without IV contrast. RADIATION DOSE REDUCTION: This exam was performed according to the departmental dose-optimization program which includes automated  exposure control, adjustment of the mA and/or kV according to patient size and/or use of iterative reconstruction technique. COMPARISON:  CTA abdomen/pelvis dated 11/29/2021 FINDINGS: Lower chest: Lung bases are clear. Hepatobiliary: Unenhanced liver is unremarkable. Suspected small gallstones (series 2/image 21), without associated lytic changes in node pedicle sclerotic dilatation. Pancreas: Within normal limits. Spleen: Within normal limits. Adrenals/Urinary Tract: Adrenal glands are within normal limits. Kidneys are within normal. No renal, ureteral, or bladder calculi. No hydronephrosis Bladder is underdistended. Stomach/Bowel: Stomach is within normal limits. No evidence of bowel obstruction. Appendix is not discretely visualized. Diffuse wall thickening with mild pericolonic inflammatory changes involving the left colon, suggesting infectious/inflammatory colitis. Vascular/Lymphatic: No evidence of abdominal aortic aneurysm. Atherosclerotic calcifications of the abdominal aorta and branch vessels. No suspicious abdominopelvic lymphadenopathy. Reproductive: Status post hysterectomy. No adnexal masses. Other: No abdominopelvic ascites. Musculoskeletal: Mild degenerative changes of the lower thoracic spine. IMPRESSION: Infectious/inflammatory colitis involving the left colon. Suspected small gallstones. Electronically Signed   By: Charline Bills M.D.   On: 09/16/2023 23:55    Labs: BNP (last 3 results) No results for input(s): "BNP" in the last 8760 hours. Basic Metabolic Panel: Recent Labs  Lab 09/16/23 2044 09/16/23 2356 09/17/23 0813 09/18/23 0444 09/19/23 0509 09/20/23 0441  NA 135  --  136 137 139 135  K 6.1* 5.1 4.8 4.3 4.2 3.8  CL 104  --  107 110 111 107  CO2 21*  --  20* 21* 21* 20*  GLUCOSE 164*  --  104* 73 101*  104*  BUN 39*  --  36* 26* 16 14  CREATININE 1.87*  --  1.70* 1.45* 1.39* 1.45*  CALCIUM 9.1  --  8.4* 8.2* 8.2* 8.1*   Liver Function Tests: Recent Labs  Lab  09/16/23 2044 09/18/23 0444  AST 20 21  ALT 12 11  ALKPHOS 78 59  BILITOT 0.7 0.7  PROT 7.8 6.3*  ALBUMIN 4.5 3.7   Recent Labs  Lab 09/16/23 2044  LIPASE 35   No results for input(s): "AMMONIA" in the last 168 hours. CBC: Recent Labs  Lab 09/17/23 0127 09/17/23 0813 09/18/23 0444 09/19/23 0509 09/20/23 0441  WBC 14.0* 13.6* 12.3* 8.2 9.8  NEUTROABS 10.1*  --   --   --   --   HGB 12.8 12.2 11.8* 10.7* 10.7*  HCT 39.6 38.6 37.6 33.6* 33.4*  MCV 95.4 96.5 97.7 96.0 95.7  PLT 161 149* 152 139* 139*   Cardiac Enzymes: No results for input(s): "CKTOTAL", "CKMB", "CKMBINDEX", "TROPONINI" in the last 168 hours. BNP: Invalid input(s): "POCBNP" CBG: Recent Labs  Lab 09/19/23 0732 09/19/23 1125 09/19/23 1628 09/19/23 2146 09/20/23 0738  GLUCAP 102* 122* 126* 128* 104*   D-Dimer No results for input(s): "DDIMER" in the last 72 hours. Hgb A1c No results for input(s): "HGBA1C" in the last 72 hours.  Lipid Profile No results for input(s): "CHOL", "HDL", "LDLCALC", "TRIG", "CHOLHDL", "LDLDIRECT" in the last 72 hours. Thyroid function studies No results for input(s): "TSH", "T4TOTAL", "T3FREE", "THYROIDAB" in the last 72 hours.  Invalid input(s): "FREET3" Anemia work up No results for input(s): "VITAMINB12", "FOLATE", "FERRITIN", "TIBC", "IRON", "RETICCTPCT" in the last 72 hours. Urinalysis    Component Value Date/Time   COLORURINE YELLOW 09/16/2023 2234   APPEARANCEUR HAZY (A) 09/16/2023 2234   APPEARANCEUR Clear 12/20/2014 1542   LABSPEC 1.019 09/16/2023 2234   LABSPEC 1.020 12/20/2014 1542   PHURINE 5.0 09/16/2023 2234   GLUCOSEU NEGATIVE 09/16/2023 2234   GLUCOSEU NEGATIVE 01/31/2023 1611   HGBUR SMALL (A) 09/16/2023 2234   HGBUR trace-intact 01/06/2008 1523   BILIRUBINUR NEGATIVE 09/16/2023 2234   BILIRUBINUR negative 09/05/2017 1014   BILIRUBINUR Negative 12/20/2014 1542   KETONESUR NEGATIVE 09/16/2023 2234   PROTEINUR NEGATIVE 09/16/2023 2234    UROBILINOGEN 0.2 01/31/2023 1611   NITRITE NEGATIVE 09/16/2023 2234   LEUKOCYTESUR LARGE (A) 09/16/2023 2234   LEUKOCYTESUR Trace 12/20/2014 1542   Sepsis Labs Recent Labs  Lab 09/17/23 0813 09/18/23 0444 09/19/23 0509 09/20/23 0441  WBC 13.6* 12.3* 8.2 9.8   Microbiology Recent Results (from the past 240 hour(s))  Urine Culture (for pregnant, neutropenic or urologic patients or patients with an indwelling urinary catheter)     Status: Abnormal   Collection Time: 09/16/23  7:57 AM   Specimen: Urine, Clean Catch  Result Value Ref Range Status   Specimen Description   Final    URINE, CLEAN CATCH Performed at [facility], [address], [place], [place] [address]    Special Requests   Final    NONE Performed at [facility], [address], [place], [place] [address]    Culture >=100,000 COLONIES/mL ESCHERICHIA COLI (A)  Final   Report Status 09/19/2023 FINAL  Final   Organism ID, Bacteria ESCHERICHIA COLI (A)  Final      Susceptibility   Escherichia coli - MIC*    AMPICILLIN <=2 SENSITIVE Sensitive     CEFAZOLIN <=4 SENSITIVE Sensitive     CEFEPIME <=0.12 SENSITIVE Sensitive     CEFTRIAXONE <=  0.25 SENSITIVE Sensitive     CIPROFLOXACIN >=4 RESISTANT Resistant     GENTAMICIN <=1 SENSITIVE Sensitive     IMIPENEM <=0.25 SENSITIVE Sensitive     NITROFURANTOIN <=16 SENSITIVE Sensitive     TRIMETH/SULFA >=320 RESISTANT Resistant     AMPICILLIN/SULBACTAM <=2 SENSITIVE Sensitive     PIP/TAZO <=4 SENSITIVE Sensitive     * >=100,000 COLONIES/mL ESCHERICHIA COLI  Gastrointestinal Panel by PCR , Stool     Status: None   Collection Time: 09/17/23  2:18 AM   Specimen: Stool  Result Value Ref Range Status   Campylobacter species NOT DETECTED NOT DETECTED Final   Plesimonas shigelloides NOT DETECTED NOT DETECTED Final   Salmonella species NOT DETECTED NOT DETECTED Final   Yersinia enterocolitica NOT DETECTED NOT DETECTED Final   Vibrio species  NOT DETECTED NOT DETECTED Final   Vibrio cholerae NOT DETECTED NOT DETECTED Final   Enteroaggregative E coli (EAEC) NOT DETECTED NOT DETECTED Final   Enteropathogenic E coli (EPEC) NOT DETECTED NOT DETECTED Final   Enterotoxigenic E coli (ETEC) NOT DETECTED NOT DETECTED Final   Shiga like toxin producing E coli (STEC) NOT DETECTED NOT DETECTED Final   Shigella/Enteroinvasive E coli (EIEC) NOT DETECTED NOT DETECTED Final   Cryptosporidium NOT DETECTED NOT DETECTED Final   Cyclospora cayetanensis NOT DETECTED NOT DETECTED Final   Entamoeba histolytica NOT DETECTED NOT DETECTED Final   Giardia lamblia NOT DETECTED NOT DETECTED Final   Adenovirus F40/41 NOT DETECTED NOT DETECTED Final   Astrovirus NOT DETECTED NOT DETECTED Final   Norovirus GI/GII NOT DETECTED NOT DETECTED Final   Rotavirus A NOT DETECTED NOT DETECTED Final   Sapovirus (I, II, IV, and V) NOT DETECTED NOT DETECTED Final    Comment: Performed at [facility], [address] [place], [place] [address]  C Difficile Quick Screen w PCR reflex     Status: None   Collection Time: 09/17/23  2:18 AM   Specimen: Stool  Result Value Ref Range Status   C Diff antigen NEGATIVE NEGATIVE Final   C Diff toxin NEGATIVE NEGATIVE Final   C Diff interpretation No C. difficile detected.  Final    Comment: Performed at [facility], [address] [place], [place] [address]   Time coordinating discharge: 35 minutes  SIGNED: Lanae Boast, MD  [facility] 09/20/2023, 9:34 AM  If 7PM-7AM, please contact night-coverage [email or web address]

## 2023-09-19 NOTE — Progress Notes (Addendum)
Daily Progress Note  DOA: 09/16/2023 Hospital Day: 4  Chief Complaint:  bloody diarrhea, abdominal pain, abnormal CT scan    ASSESSMENT & PLAN   Brief Narrative:  Meredith Hayden is a 73 y.o. year old female with a history of IBS, remote partial colectomy ( ? For diverticular disease), GERD, HTN, DM, asthma,. See PMH for additional history. Admitted 9/17 for bloody diarrhea / abdominal pain and colitis on CT scan   Dr. Elnoria Howard did GI consult, then we assumed care   Left sided colitis. Similar presentation in 2022.  Infectious colitis not excluded though C-diff and GI path panel are negative. IBD seems unlikely given abrupt onset. Could this be ischemic?  Pain improving. WBC normalized. Resolving diarrhea with only a scant amount of blood.  --Didn't take much clear liquid yesterday due to LLQ pain. Feels better today and will try clears again and then advance to soft at lunch if able.  -- Continue Rocephin and Flagyl for total of 5 days.  ---Hgb still with slow drift 12.8 >> 11.8 >> 10.7.  -- Maybe home in am? -- Made her a follow up with Dr. Marina Goodell on  12/13/23 11:40 am  --Outpatient flex sigmoidoscopy or colonoscopy after acute symptoms have fully resolved.    Remote history of sigmoid colon resection  (? for diverticular disease)     Obesity / DM2, recently started Ozempic, last dose was 6 days ago   History of QT prolongation   CKD stage 3b.  Creatinine stable at 1.39   Attending Physician Note   I have taken an interval history, reviewed the chart and examined the patient. I performed a substantive portion of this encounter, including complete performance of at least one of the key components, in conjunction with the APP. I agree with the APP's note, impression and recommendations with my edits. My additional impressions and recommendations are as follows.   Suspected ischemic colitis, LLQ pain improved however had diarrhea with advanced diet. Continue IV antibiotics  for a 5 day course. Continue dicyclomine 10 mg tid ac prn. Advance diet as tolerated. Maintain adequate hydration long term. OK for discharge when pain controlled and tolerating a soft diet. Consider outpatient flex sigmoidoscopy or colonoscopy after acute symptoms have fully resolved. Defer to primary GI at outpatient follow up.   [name], MD [facility] See [facility], [facility], for our on call provider    Subjective   Feels better compared to yesterday. No significant diarrhea but stool does still contain some blood. Dicyclomine helping LLQ pain.    Objective   C-Diff negative. GI path panel negative  Non -contrast CT AP  IMPRESSION: Infectious/inflammatory colitis involving the left colon.   Suspected small gallstones. Recent Labs    09/17/23 0813 09/18/23 0444 09/19/23 0509  WBC 13.6* 12.3* 8.2  HGB 12.2 11.8* 10.7*  HCT 38.6 37.6 33.6*  PLT 149* 152 139*   BMET Recent Labs    09/17/23 0813 09/18/23 0444 09/19/23 0509  NA 136 137 139  K 4.8 4.3 4.2  CL 107 110 111  CO2 20* 21* 21*  GLUCOSE 104* 73 101*  BUN 36* 26* 16  CREATININE 1.70* 1.45* 1.39*  CALCIUM 8.4* 8.2* 8.2*   LFT Recent Labs    09/18/23 0444  PROT 6.3*  ALBUMIN 3.7  AST 21  ALT 11  ALKPHOS 59  BILITOT 0.7   PT/INR No results for input(s): "LABPROT", "INR" in the last 72 hours.   Imaging:  CT  ABDOMEN PELVIS WO CONTRAST CLINICAL DATA:  Abdominal pain, nausea/vomiting, right red blood per rectum  EXAM: CT ABDOMEN AND PELVIS WITHOUT CONTRAST  TECHNIQUE: Multidetector CT imaging of the abdomen and pelvis was performed following the standard protocol without IV contrast.  RADIATION DOSE REDUCTION: This exam was performed according to the departmental dose-optimization program which includes automated exposure control, adjustment of the mA and/or kV according to patient size and/or use of iterative reconstruction technique.  COMPARISON:  CTA abdomen/pelvis dated  11/29/2021  FINDINGS: Lower chest: Lung bases are clear.  Hepatobiliary: Unenhanced liver is unremarkable.  Suspected small gallstones (series 2/image 21), without associated lytic changes in node pedicle sclerotic dilatation.  Pancreas: Within normal limits.  Spleen: Within normal limits.  Adrenals/Urinary Tract: Adrenal glands are within normal limits.  Kidneys are within normal. No renal, ureteral, or bladder calculi. No hydronephrosis  Bladder is underdistended.  Stomach/Bowel: Stomach is within normal limits.  No evidence of bowel obstruction.  Appendix is not discretely visualized.  Diffuse wall thickening with mild pericolonic inflammatory changes involving the left colon, suggesting infectious/inflammatory colitis.  Vascular/Lymphatic: No evidence of abdominal aortic aneurysm.  Atherosclerotic calcifications of the abdominal aorta and branch vessels.  No suspicious abdominopelvic lymphadenopathy.  Reproductive: Status post hysterectomy.  No adnexal masses.  Other: No abdominopelvic ascites.  Musculoskeletal: Mild degenerative changes of the lower thoracic spine.  IMPRESSION: Infectious/inflammatory colitis involving the left colon.  Suspected small gallstones.  Electronically Signed   By: Charline Bills M.D.   On: 09/16/2023 23:55     Scheduled inpatient medications:   clobetasol ointment  1 Application Topical Daily   fluticasone  1 spray Each Nare Daily   gabapentin  600 mg Oral BID   insulin aspart  0-5 Units Subcutaneous QHS   insulin aspart  0-9 Units Subcutaneous TID WC   levothyroxine  75 mcg Oral Q0600   loratadine  10 mg Oral Daily   metoprolol tartrate  12.5 mg Oral BID   metroNIDAZOLE  500 mg Oral Q12H   pantoprazole  40 mg Oral Daily   rosuvastatin  20 mg Oral Daily   traZODone  50 mg Oral QHS   vortioxetine HBr  20 mg Oral Daily   Continuous inpatient infusions:   cefTRIAXone (ROCEPHIN)  IV 2 g (09/19/23 0927)    dextrose 5 % and 0.9 % NaCl 75 mL/hr at 09/19/23 0000   PRN inpatient medications: dicyclomine, fentaNYL (SUBLIMAZE) injection, naLOXone (NARCAN)  injection, ondansetron (ZOFRAN) IV, simethicone  Vital signs in last 24 hours: Temp:  [98.1 F (36.7 C)-98.4 F (36.9 C)] 98.1 F (36.7 C) (09/19 0625) Pulse Rate:  [51-69] 69 (09/19 0625) Resp:  [18] 18 (09/19 0625) BP: (100-119)/(43-55) 114/51 (09/19 0625) SpO2:  [95 %-98 %] 98 % (09/19 0625) Last BM Date : 09/18/23  Intake/Output Summary (Last 24 hours) at 09/19/2023 0940 Last data filed at 09/19/2023 0643 Gross per 24 hour  Intake 2382.5 ml  Output 2200 ml  Net 182.5 ml    Intake/Output from previous day: 09/18 0701 - 09/19 0700 In: 2382.5 [P.O.:840; I.V.:1442.5; IV Piggyback:100] Out: 2200 [Urine:2200] Intake/Output this shift: No intake/output data recorded.   Physical Exam:  General: Alert female in NAD Heart:  Regular rate and rhythm.  Pulmonary: Normal respiratory effort Abdomen: Soft, nondistended, mild LLQ tenderness. Normal bowel sounds. Extremities: No lower extremity edema  Neurologic: Alert and oriented Psych: Pleasant. Cooperative. Insight appears normal.    Principal Problem:   Colitis Active Problems:   Hypothyroidism   Essential  hypertension   Diabetes (HCC)   UTI (urinary tract infection)   AKI (acute kidney injury) (HCC)   Abnormal CT scan, colon     LOS: 2 days   Meredith Hayden ,NP 09/19/2023, 9:40 AM

## 2023-09-19 NOTE — Progress Notes (Signed)
   09/19/23 0859  TOC Brief Assessment  Insurance and Status Reviewed  Patient has primary care physician Yes  Home environment has been reviewed home with spouse  Prior level of function: independent  Prior/Current Home Services No current home services  Social Determinants of Health Reivew SDOH reviewed no interventions necessary  Readmission risk has been reviewed Yes  Transition of care needs no transition of care needs at this time

## 2023-09-19 NOTE — Plan of Care (Signed)
  Problem: Fluid Volume: Goal: Ability to maintain a balanced intake and output will improve Outcome: Progressing   Problem: Nutritional: Goal: Maintenance of adequate nutrition will improve Outcome: Adequate for Discharge

## 2023-09-20 DIAGNOSIS — K529 Noninfective gastroenteritis and colitis, unspecified: Secondary | ICD-10-CM | POA: Diagnosis not present

## 2023-09-20 LAB — CBC
HCT: 33.4 % — ABNORMAL LOW (ref 36.0–46.0)
Hemoglobin: 10.7 g/dL — ABNORMAL LOW (ref 12.0–15.0)
MCH: 30.7 pg (ref 26.0–34.0)
MCHC: 32 g/dL (ref 30.0–36.0)
MCV: 95.7 fL (ref 80.0–100.0)
Platelets: 139 10*3/uL — ABNORMAL LOW (ref 150–400)
RBC: 3.49 MIL/uL — ABNORMAL LOW (ref 3.87–5.11)
RDW: 12.4 % (ref 11.5–15.5)
WBC: 9.8 10*3/uL (ref 4.0–10.5)
nRBC: 0 % (ref 0.0–0.2)

## 2023-09-20 LAB — BASIC METABOLIC PANEL
Anion gap: 8 (ref 5–15)
BUN: 14 mg/dL (ref 8–23)
CO2: 20 mmol/L — ABNORMAL LOW (ref 22–32)
Calcium: 8.1 mg/dL — ABNORMAL LOW (ref 8.9–10.3)
Chloride: 107 mmol/L (ref 98–111)
Creatinine, Ser: 1.45 mg/dL — ABNORMAL HIGH (ref 0.44–1.00)
GFR, Estimated: 39 mL/min — ABNORMAL LOW (ref >60)
Glucose, Bld: 104 mg/dL — ABNORMAL HIGH (ref 70–99)
Potassium: 3.8 mmol/L (ref 3.5–5.1)
Sodium: 135 mmol/L (ref 135–145)

## 2023-09-20 LAB — GLUCOSE, CAPILLARY: Glucose-Capillary: 104 mg/dL — ABNORMAL HIGH (ref 70–99)

## 2023-09-20 MED ORDER — INSULIN LISPRO (1 UNIT DIAL) 100 UNIT/ML (KWIKPEN): 0.0000 [IU] | PEN_INJECTOR | Freq: Three times a day (TID) | SUBCUTANEOUS | Status: DC

## 2023-09-20 MED ORDER — METRONIDAZOLE 500 MG PO TABS: 500.0000 mg | ORAL_TABLET | Freq: Two times a day (BID) | ORAL | 0 refills | Status: AC

## 2023-09-20 MED ORDER — CEFDINIR 300 MG PO CAPS: 300.0000 mg | ORAL_CAPSULE | Freq: Two times a day (BID) | ORAL | 0 refills | Status: AC

## 2023-09-20 NOTE — Progress Notes (Signed)
Daily Progress Note  DOA: 09/16/2023 Hospital Day: 5  Chief Complaint: bloody diarrhea, abdominal pain, abnormal CT scan   ASSESSMENT & PLAN   Brief Narrative:  Meredith Hayden is a 73 y.o. year old female with a history of IBS, remote partial colectomy ( ? For diverticular disease), GERD, HTN, DM, asthma,. See PMH for additional history. Admitted 9/17 for bloody diarrhea / abdominal pain and colitis on CT scan   Dr. Elnoria Howard did GI consult, then we assumed care    Left sided colitis. Similar presentation in 2022.  Ischemic colitis suspected.  Pain, diarrhea / bleeding resolved though not taking much PO. However, she is able to tolerate some liquids and crackers and feels certain she is ready for discharge and will be fine with solids at home.  --Hgb stable at 10.7 --Made her a follow up with Dr. Marina Goodell on  12/13/23 11:40 am  --Outpatient flex sigmoidoscopy or colonoscopy after acute symptoms have fully resolved.    Remote history of sigmoid colon resection  (? for diverticular disease)    E.Coli UTI.  Going home on Omnicef   Obesity / DM2, recently started Ozempic, last dose was 6 days ago   History of QT prolongation   CKD stage 3b.  Creatinine stable at 1.45   Subjective   Feels well. No abdominal pain. Stools starting to form, no further bleeding. Tolerating liquids and crackers. Wants to go home   Objective     Recent Labs    09/18/23 0444 09/19/23 0509 09/20/23 0441  WBC 12.3* 8.2 9.8  HGB 11.8* 10.7* 10.7*  HCT 37.6 33.6* 33.4*  PLT 152 139* 139*   BMET Recent Labs    09/18/23 0444 09/19/23 0509 09/20/23 0441  NA 137 139 135  K 4.3 4.2 3.8  CL 110 111 107  CO2 21* 21* 20*  GLUCOSE 73 101* 104*  BUN 26* 16 14  CREATININE 1.45* 1.39* 1.45*  CALCIUM 8.2* 8.2* 8.1*   LFT Recent Labs    09/18/23 0444  PROT 6.3*  ALBUMIN 3.7  AST 21  ALT 11  ALKPHOS 59  BILITOT 0.7   PT/INR No results for input(s): "LABPROT", "INR" in the last 72  hours.   Imaging:  CT ABDOMEN PELVIS WO CONTRAST CLINICAL DATA:  Abdominal pain, nausea/vomiting, right red blood per rectum  EXAM: CT ABDOMEN AND PELVIS WITHOUT CONTRAST  TECHNIQUE: Multidetector CT imaging of the abdomen and pelvis was performed following the standard protocol without IV contrast.  RADIATION DOSE REDUCTION: This exam was performed according to the departmental dose-optimization program which includes automated exposure control, adjustment of the mA and/or kV according to patient size and/or use of iterative reconstruction technique.  COMPARISON:  CTA abdomen/pelvis dated 11/29/2021  FINDINGS: Lower chest: Lung bases are clear.  Hepatobiliary: Unenhanced liver is unremarkable.  Suspected small gallstones (series 2/image 21), without associated lytic changes in node pedicle sclerotic dilatation.  Pancreas: Within normal limits.  Spleen: Within normal limits.  Adrenals/Urinary Tract: Adrenal glands are within normal limits.  Kidneys are within normal. No renal, ureteral, or bladder calculi. No hydronephrosis  Bladder is underdistended.  Stomach/Bowel: Stomach is within normal limits.  No evidence of bowel obstruction.  Appendix is not discretely visualized.  Diffuse wall thickening with mild pericolonic inflammatory changes involving the left colon, suggesting infectious/inflammatory colitis.  Vascular/Lymphatic: No evidence of abdominal aortic aneurysm.  Atherosclerotic calcifications of the abdominal aorta and branch vessels.  No suspicious abdominopelvic lymphadenopathy.  Reproductive: Status post  hysterectomy.  No adnexal masses.  Other: No abdominopelvic ascites.  Musculoskeletal: Mild degenerative changes of the lower thoracic spine.  IMPRESSION: Infectious/inflammatory colitis involving the left colon.  Suspected small gallstones.  Electronically Signed   By: Charline Bills M.D.   On: 09/16/2023 23:55      Scheduled inpatient medications:   clobetasol ointment  1 Application Topical Daily   fluticasone  1 spray Each Nare Daily   gabapentin  600 mg Oral BID   insulin aspart  0-5 Units Subcutaneous QHS   insulin aspart  0-9 Units Subcutaneous TID WC   levothyroxine  75 mcg Oral Q0600   loratadine  10 mg Oral Daily   metoprolol tartrate  12.5 mg Oral BID   metroNIDAZOLE  500 mg Oral Q12H   pantoprazole  40 mg Oral Daily   rosuvastatin  20 mg Oral Daily   traZODone  50 mg Oral QHS   vortioxetine HBr  20 mg Oral Daily   Continuous inpatient infusions:   cefTRIAXone (ROCEPHIN)  IV Stopped (09/20/23 1029)   PRN inpatient medications: dicyclomine, fentaNYL (SUBLIMAZE) injection, naLOXone (NARCAN)  injection, ondansetron (ZOFRAN) IV, simethicone  Vital signs in last 24 hours: Temp:  [97.8 F (36.6 C)-98.4 F (36.9 C)] 98.4 F (36.9 C) (09/20 0600) Pulse Rate:  [56-72] 56 (09/20 0600) Resp:  [16-18] 16 (09/20 0600) BP: (136-152)/(64-72) 152/64 (09/20 0600) SpO2:  [95 %-98 %] 95 % (09/20 0600) Last BM Date : 09/20/23  Intake/Output Summary (Last 24 hours) at 09/20/2023 1053 Last data filed at 09/20/2023 0612 Gross per 24 hour  Intake 1680 ml  Output 2450 ml  Net -770 ml    Intake/Output from previous day: 09/19 0701 - 09/20 0700 In: 2266.3 [P.O.:1320; I.V.:846.3; IV Piggyback:100] Out: 2800 [Urine:2800] Intake/Output this shift: No intake/output data recorded.   Physical Exam:  General: Alert female in NAD Heart:  Regular rate and rhythm.  Pulmonary: Normal respiratory effort Abdomen: Soft, nondistended, nontender. Normal bowel sounds. Extremities: No lower extremity edema  Neurologic: Alert and oriented Psych: Pleasant. Cooperative. Insight appears normal.    Principal Problem:   Colitis Active Problems:   Hypothyroidism   Essential hypertension   Diabetes (HCC)   UTI (urinary tract infection)   AKI (acute kidney injury) (HCC)   Abnormal CT scan, colon      LOS: 3 days   Willette Cluster ,NP 09/20/2023, 10:53 AM

## 2023-09-24 ENCOUNTER — Encounter: Payer: Self-pay | Admitting: Internal Medicine

## 2023-09-24 ENCOUNTER — Ambulatory Visit (INDEPENDENT_AMBULATORY_CARE_PROVIDER_SITE_OTHER): Payer: Medicare Other | Admitting: Internal Medicine

## 2023-09-24 ENCOUNTER — Telehealth: Payer: Self-pay

## 2023-09-24 VITALS — BP 126/78 | HR 68 | Temp 98.6°F | Ht 62.0 in | Wt 247.0 lb

## 2023-09-24 DIAGNOSIS — E039 Hypothyroidism, unspecified: Secondary | ICD-10-CM

## 2023-09-24 DIAGNOSIS — E1129 Type 2 diabetes mellitus with other diabetic kidney complication: Secondary | ICD-10-CM

## 2023-09-24 DIAGNOSIS — R809 Proteinuria, unspecified: Secondary | ICD-10-CM

## 2023-09-24 DIAGNOSIS — D539 Nutritional anemia, unspecified: Secondary | ICD-10-CM | POA: Insufficient documentation

## 2023-09-24 DIAGNOSIS — R82998 Other abnormal findings in urine: Secondary | ICD-10-CM | POA: Insufficient documentation

## 2023-09-24 DIAGNOSIS — N1831 Chronic kidney disease, stage 3a: Secondary | ICD-10-CM

## 2023-09-24 DIAGNOSIS — Z794 Long term (current) use of insulin: Secondary | ICD-10-CM

## 2023-09-24 DIAGNOSIS — E119 Type 2 diabetes mellitus without complications: Secondary | ICD-10-CM

## 2023-09-24 DIAGNOSIS — E781 Pure hyperglyceridemia: Secondary | ICD-10-CM

## 2023-09-24 DIAGNOSIS — Z1231 Encounter for screening mammogram for malignant neoplasm of breast: Secondary | ICD-10-CM

## 2023-09-24 LAB — FOLATE: Folate: 9.3 ng/mL (ref >5.9)

## 2023-09-24 LAB — IBC + FERRITIN
Ferritin: 57.5 ng/mL (ref 10.0–291.0)
Iron: 56 ug/dL (ref 42–145)
Saturation Ratios: 19 % — ABNORMAL LOW (ref 20.0–50.0)
TIBC: 295.4 ug/dL (ref 250.0–450.0)
Transferrin: 211 mg/dL — ABNORMAL LOW (ref 212.0–360.0)

## 2023-09-24 LAB — CBC WITH DIFFERENTIAL/PLATELET
Basophils Absolute: 0.1 10*3/uL (ref 0.0–0.1)
Basophils Relative: 1.2 % (ref 0.0–3.0)
Eosinophils Absolute: 0.2 10*3/uL (ref 0.0–0.7)
Eosinophils Relative: 2 % (ref 0.0–5.0)
HCT: 38.5 % (ref 36.0–46.0)
Hemoglobin: 12.4 g/dL (ref 12.0–15.0)
Lymphocytes Relative: 25.2 % (ref 12.0–46.0)
Lymphs Abs: 2.3 10*3/uL (ref 0.7–4.0)
MCHC: 32.1 g/dL (ref 30.0–36.0)
MCV: 93.8 fl (ref 78.0–100.0)
Monocytes Absolute: 1.1 10*3/uL — ABNORMAL HIGH (ref 0.1–1.0)
Monocytes Relative: 11.7 % (ref 3.0–12.0)
Neutro Abs: 5.6 10*3/uL (ref 1.4–7.7)
Neutrophils Relative %: 59.9 % (ref 43.0–77.0)
Platelets: 205 10*3/uL (ref 150.0–400.0)
RBC: 4.1 Mil/uL (ref 3.87–5.11)
RDW: 13.3 % (ref 11.5–15.5)
WBC: 9.3 10*3/uL (ref 4.0–10.5)

## 2023-09-24 LAB — BASIC METABOLIC PANEL
BUN: 17 mg/dL (ref 6–23)
CO2: 28 mEq/L (ref 19–32)
Calcium: 8.6 mg/dL (ref 8.4–10.5)
Chloride: 104 mEq/L (ref 96–112)
Creatinine, Ser: 1.48 mg/dL — ABNORMAL HIGH (ref 0.40–1.20)
GFR: 35.54 mL/min — ABNORMAL LOW (ref >60.00)
Glucose, Bld: 84 mg/dL (ref 70–99)
Potassium: 3.9 mEq/L (ref 3.5–5.1)
Sodium: 139 mEq/L (ref 135–145)

## 2023-09-24 LAB — TSH: TSH: 0.65 u[IU]/mL (ref 0.35–5.50)

## 2023-09-24 LAB — VITAMIN B12: Vitamin B-12: 440 pg/mL (ref 211–911)

## 2023-09-24 MED ORDER — GVOKE HYPOPEN 2-PACK 1 MG/0.2ML ~~LOC~~ SOAJ
1.0000 | Freq: Every day | SUBCUTANEOUS | 5 refills | Status: AC | PRN
Start: 2023-09-24 — End: ?

## 2023-09-24 MED ORDER — LEVOTHYROXINE SODIUM 75 MCG PO TABS
75.0000 ug | ORAL_TABLET | Freq: Every day | ORAL | 0 refills | Status: DC
Start: 2023-09-24 — End: 2023-11-13

## 2023-09-24 NOTE — Patient Instructions (Signed)

## 2023-09-24 NOTE — Transitions of Care (Post Inpatient/ED Visit) (Signed)
09/24/2023  Name: Reynolds Glidden MRN: [id number] DOB: 23-Mar-1950  Today's TOC FU Call Status: Today's TOC FU Call Status:: Unsuccessful Call (1st Attempt) Unsuccessful Call (1st Attempt) Date: 09/24/23  Attempted to reach the patient regarding the most recent Inpatient/ED visit.  Follow Up Plan: Additional outreach attempts will be made to reach the patient to complete the Transitions of Care (Post Inpatient/ED visit) call.   Signature Anderson, [place]

## 2023-09-24 NOTE — Progress Notes (Unsigned)
Subjective:  Patient ID: Meredith Hayden, female    DOB: 03/10/1950  Age: 73 y.o. MRN: [id number]  CC: Anemia and Diabetes   HPI Meredith Hayden presents for f/up ----  Discussed the use of AI scribe software for clinical note transcription with the patient, who gave verbal consent to proceed.  History of Present Illness   The patient, with a history of ischemic colitis, diabetes, and anemia, presents following a recent hospitalization. They were treated with IV antibiotics for three days for suspected infectious colitis and a urinary tract infection (UTI). During the hospital stay, their long-acting insulin was discontinued due to reduced food intake, and their A1c was noted to be 6.5%.  Since returning home, they have resumed eating and self-administering insulin, with a total of about five units administered today. They report persistent loose stools but deny the presence of blood. Their appetite has partially returned, but they are still experiencing night sweats. They are currently on metronidazole and Cefdinir.  The patient also reports a lack of well-being, describing a sensation of bounding pulses and internal unease. They liken the feeling to one that would have previously been alleviated with Xanax, but they are reluctant to use benzodiazepines due to a past struggle with dependency. They also acknowledge a potential addiction to sugar.  During their recent hospitalization, they experienced a setback in kidney function, which they attribute to severe dehydration. They also report a history of anemia due to bleeding, not iron deficiency. They deny any history of vitamin deficiency and are not currently taking an iron supplement.  The patient also has cataracts, which they report are affecting their vision, creating a sensation of a 'cloud of mist.' They are due for a colonoscopy and mammogram. They had one hypoglycemic warning two days ago.       PCP: Etta Grandchild,  MD   Admit date: 09/16/2023 Discharge date: 09/20/2023 Recommendations for Outpatient Follow-up:  Follow up with PCP in 1 weeks-call for appointment Please obtain BMP/CBC in one week     Discharge Dispo: Home Discharge Condition: Stable Code Status:   Code Status: Full Code Diet recommendation:  Diet Order                  DIET SOFT Room service appropriate? Yes; Fluid consistency: Thin  Diet effective now                         Brief/Interim Summary: 73 y.o.f w/ IDDM, GERD, hypertension, hyperlipidemia, IBS, OSA, anxiety, depression, asthma, hypothyroidism, SVT, CKD stage IIIb presented to ED with complaints of abdominal, nausea, vomiting, and bloody diarrhea. In ED-vitals stable labs with leukocytosis AKI hyperkalemia, normal lipase and LFTs, lactic acid normal x 2.  UA with large amount of leukocytes and microscopy showing >50 WBCs and many bacteria CT abdomen pelvis>> infectious or inflammatory colitis involving the left colon. GI was consulted and admitted for further management and started on antibiotics  GI has seen the patient.  Managing conservatively with IV antibiotics and planning for outpatient colonoscopy/sigmoidoscopy once acute issue resolves.  No more bleeding noticed, diet was slowly advanced at this time tolerating soft diet she had hypoglycemia likely in the setting of insulin with n.p.o. status, managed with IV dextrose and has been weaned off.  She needs to discuss with her PCP regarding returning back on her insulin as sugar remains controlled without insulin regimen at this time.  Instructed to continue sliding scale insulin appropriate instruction  provided explained risk of hypoglycemia-        Discharge Diagnoses:  Principal Problem:   Colitis Active Problems:   Hypothyroidism   Essential hypertension   Diabetes (HCC)   UTI (urinary tract infection)   AKI (acute kidney injury) (HCC)   Abnormal CT scan, colon Recurrent colitis  Hematochezia  -Hemoccult positive Left lower quadrant abdominal pain Remote history of sigmoid colon resection/? for  Diverticular disease: Last colonoscopy 2017 was normal, GI following less likely inflammatory bowel disease. GI panel and C. difficile negative GI has seen the patient advised antibiotics and to consider outpatient flexible sigmoidoscopy or colonoscopy after acute symptoms have fully resolved.  Patient at this time doing well tolerating soft diet okay for discharge home on oral antibiotics and outpatient follow-up with GI.     Insulin-dependent diabetes mellitus Hypoglycemia transient: PTA on Humalog 14 units Premeal morning and bedtime, Toujeo 50 units daily.  Due to hypoglycemia insulin has been discontinued placed on D5 blood sugar fairly stable, without D5, she is on Humalog at home I changed to sliding scale insulin start long-acting insulin for now I advised her to follow-up with PCP, sliding scale insulin instruction provided    E coli UTI: Sensitive to cefazolin/ceftriaxone resistant to Bactrim and Cipro.  Continue omnicef.     AKI on CKD stage IIIb: Baseline creatinine 1.5 in February, peaked to 1.8 now downtrending keep on IV fluids.  Continue to monitor Renal function overall stable follow-up outpatient   Hyperkalemia: Resolved   GERD: Continue PPI   SVT Primary hypertension: BP and HR stable, Cont metoprolol 12.5 twice daily   Hyperlipidemia: Continue Crestor 20 mg   Chronic pain Neuropathy Anxiety Depression: Her mood is stable and is pleasant. Continue trazodone, Neurontin Trintellix   Hypothyroidism: Continue home Synthroid   Asthma: Not wheezing.  Prn nebs   Morbid  Obesity:Patient's Body mass index is 45.73 kg/m. : Will benefit with PCP follow-up, weight loss  healthy lifestyle and outpatient sleep evaluation.     Consults: GI Subjective: Alert awake oriented resting comfortably did not sleep well, tolerating soft diet, eager to go home today    Discharge Exam:     Vitals:    09/19/23 1933 09/20/23 0600  BP: 139/71 (!) 152/64  Pulse: 64 (!) 56  Resp: 16 16  Temp: 98.1 F (36.7 C) 98.4 F (36.9 C)  SpO2: 98% 95%     Outpatient Medications Prior to Visit  Medication Sig Dispense Refill   aspirin 81 MG tablet Take 81 mg by mouth daily.      cefdinir (OMNICEF) 300 MG capsule Take 1 capsule (300 mg total) by mouth 2 (two) times daily for 7 days. 14 capsule 0   cetirizine (ZYRTEC) 10 MG tablet Take 10 mg by mouth daily.      Cholecalciferol (VITAMIN D3) 125 MCG (5000 UT) TBDP Take 5,000 Units by mouth 3 (three) times a week.     clobetasol ointment (TEMOVATE) 0.05 % Apply 1 Application topically daily.     Continuous Blood Gluc Receiver (FREESTYLE LIBRE 2 READER) DEVI 1 Act by Does not apply route daily. 2 each 5   Continuous Blood Gluc Sensor (FREESTYLE LIBRE 2 SENSOR) MISC 1 Act by Does not apply route daily. 2 each 5   dicyclomine (BENTYL) 10 MG capsule TAKE 1 CAPSULE BY MOUTH 3 TIMES  DAILY BEFORE MEALS (Patient taking differently: Take 10 mg by mouth 3 (three) times daily as needed for spasms.) 270 capsule 0   fluticasone (  FLONASE) 50 MCG/ACT nasal spray Place 1 spray into both nostrils daily.     gabapentin (NEURONTIN) 600 MG tablet Take 600 mg by mouth 2 (two) times daily.     insulin lispro (HUMALOG KWIKPEN) 100 UNIT/ML KwikPen Inject 0-9 Units into the skin 3 (three) times daily. CBG 70 - 120: 0 units CBG 121 - 150: 1 unit CBG 151 - 200: 2 units CBG 201 - 250: 3 units CBG 251 - 300: 5 units CBG 301 - 350: 7 units CBG 351 - 400: 9 units CBG > 400: call MD     Insulin Pen Needle (UNIFINE PENTIPS PLUS) 33G X 4 MM MISC Inject 1 Act into the skin 4 (four) times daily. 400 each 1   ipratropium (ATROVENT HFA) 17 MCG/ACT inhaler USE 2 PUFFS EVERY 4 HOURS  AS NEEDED FOR WHEEZING. 12.9 g 3   KERENDIA 20 MG TABS TAKE 1 TABLET BY MOUTH DAILY 100 tablet 0   Lancets (ONETOUCH DELICA PLUS LANCET33G) MISC USE AS DIRECTED TO CHECK BLOOD  SUGAR 4 TIMES DAILY AFTER MEALS AND AT BEDTIME. 200 each 4   lisinopril (ZESTRIL) 20 MG tablet TAKE 1 TABLET BY MOUTH DAILY 100 tablet 0   loperamide (IMODIUM) 2 MG capsule Take 2 mg by mouth as needed for diarrhea or loose stools.     metoprolol tartrate (LOPRESSOR) 25 MG tablet Take 0.5 tablets (12.5 mg total) by mouth 2 (two) times daily. 45 tablet 3   metroNIDAZOLE (FLAGYL) 500 MG tablet Take 1 tablet (500 mg total) by mouth 2 (two) times daily for 7 days. 14 tablet 0   nystatin ointment (MYCOSTATIN) Apply 1 Application topically 2 (two) times daily as needed.     omeprazole (PRILOSEC) 20 MG capsule Take 20 mg by mouth daily.     ONETOUCH VERIO test strip CHECK BLOOD SUGAR 4 TIMES A DAY. 200 strip 5   rosuvastatin (CRESTOR) 20 MG tablet TAKE 1 TABLET BY MOUTH DAILY 100 tablet 0   Semaglutide,0.25 or 0.5MG /DOS, (OZEMPIC, 0.25 OR 0.5 MG/DOSE,) 2 MG/3ML SOPN Inject 0.25 mg into the skin once a week.     Simethicone 125 MG CAPS Take 1 capsule by mouth daily as needed.     traZODone (DESYREL) 50 MG tablet Take 50 mg by mouth at bedtime.     TRINTELLIX 20 MG TABS tablet Take 20 mg by mouth daily.     zaleplon (SONATA) 10 MG capsule Take 10 mg by mouth at bedtime as needed for sleep.     Glucagon (GVOKE HYPOPEN 2-PACK) 1 MG/0.2ML SOAJ Inject 1 Act into the skin daily as needed. 2 mL 5   SYNTHROID 75 MCG tablet TAKE 1 TABLET DAILY BEFORE BREAKFAST FOR HYPOTHYROIDISM 90 tablet 1   No facility-administered medications prior to visit.    ROS Review of Systems  Constitutional:  Negative for appetite change, chills, diaphoresis, fatigue and fever.  HENT: Negative.    Respiratory:  Negative for cough, chest tightness, shortness of breath and wheezing.   Cardiovascular:  Negative for chest pain, palpitations and leg swelling.  Gastrointestinal:  Positive for diarrhea. Negative for abdominal pain, blood in stool, constipation, nausea and vomiting.  Endocrine: Negative.   Genitourinary: Negative.   Negative for difficulty urinating.  Musculoskeletal:  Negative for arthralgias and myalgias.  Skin:  Positive for pallor. Negative for rash.  Allergic/Immunologic: Negative.   Neurological: Negative.  Negative for dizziness, weakness and light-headedness.  Hematological:  Negative for adenopathy. Does not bruise/bleed easily.  Psychiatric/Behavioral:  Positive  for dysphoric mood. Negative for confusion, decreased concentration, self-injury, sleep disturbance and suicidal ideas. The patient is nervous/anxious.     Objective:  BP 126/78 (BP Location: Right Arm, Patient Position: Sitting, Cuff Size: Large)   Pulse 68   Temp 98.6 F (37 C) (Oral)   Ht 5\' 2"  (1.575 m)   Wt 247 lb (112 kg)   LMP 12/31/1993   SpO2 92%   BMI 45.18 kg/m   BP Readings from Last 3 Encounters:  09/24/23 126/78  09/20/23 (!) 152/64  08/13/23 127/75    Wt Readings from Last 3 Encounters:  09/24/23 247 lb (112 kg)  09/16/23 250 lb (113.4 kg)  08/13/23 250 lb (113.4 kg)    Physical Exam Vitals reviewed.  Constitutional:      Appearance: She is obese. She is not ill-appearing.  HENT:     Mouth/Throat:     Mouth: Mucous membranes are moist.  Eyes:     General: No scleral icterus.    Conjunctiva/sclera: Conjunctivae normal.  Cardiovascular:     Rate and Rhythm: Normal rate and regular rhythm.     Pulses: Normal pulses.     Heart sounds: No murmur heard.    No friction rub. No gallop.  Pulmonary:     Effort: Pulmonary effort is normal.     Breath sounds: No stridor. No wheezing, rhonchi or rales.  Abdominal:     General: Abdomen is protuberant. Bowel sounds are normal. There is no distension.     Palpations: Abdomen is soft. There is no hepatomegaly, splenomegaly or mass.     Tenderness: There is no abdominal tenderness.  Musculoskeletal:        General: Normal range of motion.     Cervical back: Neck supple.     Right lower leg: No edema.     Left lower leg: No edema.  Lymphadenopathy:      Cervical: No cervical adenopathy.  Skin:    General: Skin is warm.     Coloration: Skin is pale.     Findings: No lesion.  Neurological:     General: No focal deficit present.     Mental Status: She is alert. Mental status is at baseline.  Psychiatric:        Mood and Affect: Mood normal.        Behavior: Behavior normal.     Lab Results  Component Value Date   WBC 9.3 09/24/2023   HGB 12.4 09/24/2023   HCT 38.5 09/24/2023   PLT 205.0 09/24/2023   GLUCOSE 84 09/24/2023   CHOL 150 08/27/2023   TRIG 156 08/27/2023   HDL 50 08/27/2023   LDLDIRECT 70.0 07/30/2022   LDLCALC 74 08/27/2023   ALT 11 09/18/2023   AST 21 09/18/2023   NA 139 09/24/2023   K 3.9 09/24/2023   CL 104 09/24/2023   CREATININE 1.48 (H) 09/24/2023   BUN 17 09/24/2023   CO2 28 09/24/2023   TSH 0.65 09/24/2023   INR 1.1 11/29/2021   HGBA1C 6.5 (H) 09/17/2023   MICROALBUR 3.15 08/27/2023   MICROALBUR 3.15 08/27/2023    CT ABDOMEN PELVIS WO CONTRAST  Result Date: 09/16/2023 CLINICAL DATA:  Abdominal pain, nausea/vomiting, right red blood per rectum EXAM: CT ABDOMEN AND PELVIS WITHOUT CONTRAST TECHNIQUE: Multidetector CT imaging of the abdomen and pelvis was performed following the standard protocol without IV contrast. RADIATION DOSE REDUCTION: This exam was performed according to the departmental dose-optimization program which includes automated exposure control, adjustment of the mA  and/or kV according to patient size and/or use of iterative reconstruction technique. COMPARISON:  CTA abdomen/pelvis dated 11/29/2021 FINDINGS: Lower chest: Lung bases are clear. Hepatobiliary: Unenhanced liver is unremarkable. Suspected small gallstones (series 2/image 21), without associated lytic changes in node pedicle sclerotic dilatation. Pancreas: Within normal limits. Spleen: Within normal limits. Adrenals/Urinary Tract: Adrenal glands are within normal limits. Kidneys are within normal. No renal, ureteral, or bladder  calculi. No hydronephrosis Bladder is underdistended. Stomach/Bowel: Stomach is within normal limits. No evidence of bowel obstruction. Appendix is not discretely visualized. Diffuse wall thickening with mild pericolonic inflammatory changes involving the left colon, suggesting infectious/inflammatory colitis. Vascular/Lymphatic: No evidence of abdominal aortic aneurysm. Atherosclerotic calcifications of the abdominal aorta and branch vessels. No suspicious abdominopelvic lymphadenopathy. Reproductive: Status post hysterectomy. No adnexal masses. Other: No abdominopelvic ascites. Musculoskeletal: Mild degenerative changes of the lower thoracic spine. IMPRESSION: Infectious/inflammatory colitis involving the left colon. Suspected small gallstones. Electronically Signed   By: Charline Bills M.D.   On: 09/16/2023 23:55    Assessment & Plan:   Stage 3a chronic kidney disease (HCC) - Renal function is stable. -     Basic metabolic panel; Future -     Urinalysis, Routine w reflex microscopic; Future  Type 2 diabetes mellitus with diabetic microalbuminuria, with long-term current use of insulin (HCC) - Blood sugar is well controlled. -     Basic metabolic panel; Future  Urine leukocytes -     Urinalysis, Routine w reflex microscopic; Future -     CULTURE, URINE COMPREHENSIVE; Future  Deficiency anemia - H/H are normal now. -     IBC + Ferritin; Future -     Reticulocytes; Future -     TSH; Future -     Vitamin B1; Future -     Zinc; Future -     Folate; Future -     CBC with Differential/Platelet; Future -     Vitamin B12; Future  Screening mammogram for breast cancer -     Digital Screening Mammogram, Left and Right; Future  Acquired hypothyroidism - She is euthyroid. -     Levothyroxine Sodium; Take 1 tablet (75 mcg total) by mouth daily before breakfast.  Dispense: 90 tablet; Refill: 0  Insulin-requiring or dependent type II diabetes mellitus (HCC) -     Gvoke HypoPen 2-Pack; Inject 1  Act into the skin daily as needed.  Dispense: 2 mL; Refill: 5     Follow-up: Return in about 3 months (around 12/24/2023).  Sanda Linger, MD

## 2023-09-25 ENCOUNTER — Telehealth: Payer: Self-pay

## 2023-09-25 LAB — URINALYSIS, ROUTINE W REFLEX MICROSCOPIC
Bilirubin Urine: NEGATIVE
Hgb urine dipstick: NEGATIVE
Ketones, ur: NEGATIVE
Nitrite: NEGATIVE
Specific Gravity, Urine: <=1.005 — AB (ref 1.000–1.030)
Total Protein, Urine: NEGATIVE
Urine Glucose: NEGATIVE
Urobilinogen, UA: 0.2 (ref 0.0–1.0)
pH: 6 (ref 5.0–8.0)

## 2023-09-25 NOTE — Transitions of Care (Post Inpatient/ED Visit) (Signed)
09/25/2023  Name: Meredith Hayden MRN: [id number] DOB: 01/11/1950  Patient was seen yesterday 9/24 in office with her PCP. No need for the call.  Jeneen Rinks

## 2023-09-27 LAB — VITAMIN B1: Vitamin B1 (Thiamine): 7 nmol/L — ABNORMAL LOW (ref 8–30)

## 2023-09-28 ENCOUNTER — Other Ambulatory Visit: Payer: Self-pay | Admitting: Internal Medicine

## 2023-09-28 DIAGNOSIS — E518 Other manifestations of thiamine deficiency: Secondary | ICD-10-CM

## 2023-09-28 MED ORDER — VITAMIN B-1 50 MG PO TABS: 50.0000 mg | ORAL_TABLET | Freq: Every day | ORAL | 1 refills | Status: DC

## 2023-10-01 LAB — RETICULOCYTES
ABS Retic: 74480 {cells}/uL (ref 20000–80000)
Retic Ct Pct: 1.9 %

## 2023-10-01 LAB — ZINC: Zinc: 68 ug/dL (ref 60–130)

## 2023-10-01 LAB — CULTURE, URINE COMPREHENSIVE: RESULT:: NO GROWTH

## 2023-10-10 ENCOUNTER — Other Ambulatory Visit: Payer: Self-pay | Admitting: Internal Medicine

## 2023-10-10 DIAGNOSIS — E11319 Type 2 diabetes mellitus with unspecified diabetic retinopathy without macular edema: Secondary | ICD-10-CM

## 2023-10-10 DIAGNOSIS — N1831 Chronic kidney disease, stage 3a: Secondary | ICD-10-CM

## 2023-10-10 DIAGNOSIS — Z794 Long term (current) use of insulin: Secondary | ICD-10-CM

## 2023-10-11 ENCOUNTER — Other Ambulatory Visit: Payer: Self-pay | Admitting: Internal Medicine

## 2023-10-11 DIAGNOSIS — E11319 Type 2 diabetes mellitus with unspecified diabetic retinopathy without macular edema: Secondary | ICD-10-CM

## 2023-10-11 DIAGNOSIS — E119 Type 2 diabetes mellitus without complications: Secondary | ICD-10-CM

## 2023-10-23 DIAGNOSIS — L814 Other melanin hyperpigmentation: Secondary | ICD-10-CM | POA: Diagnosis not present

## 2023-10-23 DIAGNOSIS — L821 Other seborrheic keratosis: Secondary | ICD-10-CM | POA: Diagnosis not present

## 2023-10-23 DIAGNOSIS — L304 Erythema intertrigo: Secondary | ICD-10-CM | POA: Diagnosis not present

## 2023-11-07 ENCOUNTER — Ambulatory Visit: Payer: Medicare Other | Admitting: Internal Medicine

## 2023-11-07 ENCOUNTER — Ambulatory Visit: Payer: Medicare Other

## 2023-11-12 ENCOUNTER — Other Ambulatory Visit: Payer: Self-pay | Admitting: Internal Medicine

## 2023-11-12 DIAGNOSIS — E119 Type 2 diabetes mellitus without complications: Secondary | ICD-10-CM

## 2023-11-13 ENCOUNTER — Other Ambulatory Visit: Payer: Self-pay | Admitting: Internal Medicine

## 2023-11-13 DIAGNOSIS — E039 Hypothyroidism, unspecified: Secondary | ICD-10-CM

## 2023-12-11 ENCOUNTER — Ambulatory Visit: Payer: Medicare Other

## 2023-12-13 ENCOUNTER — Encounter: Payer: Self-pay | Admitting: Internal Medicine

## 2023-12-13 ENCOUNTER — Ambulatory Visit: Payer: Medicare Other | Admitting: Internal Medicine

## 2023-12-13 VITALS — BP 112/70 | HR 83 | Ht 62.0 in | Wt 233.0 lb

## 2023-12-13 DIAGNOSIS — K559 Vascular disorder of intestine, unspecified: Secondary | ICD-10-CM

## 2023-12-13 DIAGNOSIS — K312 Hourglass stricture and stenosis of stomach: Secondary | ICD-10-CM | POA: Diagnosis not present

## 2023-12-13 DIAGNOSIS — K625 Hemorrhage of anus and rectum: Secondary | ICD-10-CM

## 2023-12-13 DIAGNOSIS — R933 Abnormal findings on diagnostic imaging of other parts of digestive tract: Secondary | ICD-10-CM

## 2023-12-13 DIAGNOSIS — K55039 Acute (reversible) ischemia of large intestine, extent unspecified: Secondary | ICD-10-CM

## 2023-12-13 DIAGNOSIS — K219 Gastro-esophageal reflux disease without esophagitis: Secondary | ICD-10-CM | POA: Diagnosis not present

## 2023-12-13 DIAGNOSIS — R109 Unspecified abdominal pain: Secondary | ICD-10-CM

## 2023-12-13 DIAGNOSIS — K582 Mixed irritable bowel syndrome: Secondary | ICD-10-CM

## 2023-12-13 DIAGNOSIS — R1032 Left lower quadrant pain: Secondary | ICD-10-CM

## 2023-12-13 MED ORDER — NA SULFATE-K SULFATE-MG SULF 17.5-3.13-1.6 GM/177ML PO SOLN: 1.0000 | Freq: Once | ORAL | 0 refills | Status: AC

## 2023-12-13 NOTE — Patient Instructions (Signed)
You have been scheduled for a colonoscopy. Please follow written instructions given to you at your visit today.   Please pick up your prep supplies at the pharmacy within the next 1-3 days.  If you use inhalers (even only as needed), please bring them with you on the day of your procedure.  DO NOT TAKE 14 DAYS PRIOR TO TEST- Trulicity (dulaglutide) Ozempic, Wegovy (semaglutide) Mounjaro (tirzepatide) Bydureon Bcise (exanatide extended release)  DO NOT TAKE 1 DAY PRIOR TO YOUR TEST Rybelsus (semaglutide) Adlyxin (lixisenatide) Victoza (liraglutide) Byetta (exanatide) ___________________________________________________________________________  _______________________________________________________  If your blood pressure at your visit was 140/90 or greater, please contact your primary care physician to follow up on this.  _______________________________________________________  If you are age 65 or older, your body mass index should be between 23-30. Your Body mass index is 42.62 kg/m. If this is out of the aforementioned range listed, please consider follow up with your Primary Care Provider.  If you are age 75 or younger, your body mass index should be between 19-25. Your Body mass index is 42.62 kg/m. If this is out of the aformentioned range listed, please consider follow up with your Primary Care Provider.   ________________________________________________________  The [place] [facility] providers would like to encourage you to use [facility] to communicate with providers for non-urgent requests or questions.  Due to long hold times on the telephone, sending your provider a message by [facility] may be a faster and more efficient way to get a response.  Please allow 48 business hours for a response.  Please remember that this is for non-urgent requests.  _______________________________________________________

## 2023-12-13 NOTE — Progress Notes (Signed)
HISTORY OF PRESENT ILLNESS:  Meredith Hayden is a 73 y.o. 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  She has been seen in this office for GERD complicated by peptic stricture requiring esophageal dilation and irritable bowel syndrome with alternating bowel habits.  She presents today after being hospitalized in September with acute ischemic colitis.  I last saw the patient November 14, 2016 when she underwent screening colonoscopy.  The examination was normal with evidence of prior sigmoid colon resection.  She also underwent upper endoscopy at that time for GERD and dysphagia.  She was found to have a benign peptic stricture which was dilated with 54 [place] Maloney dilator.  Patient was hospitalized with abdominal pain and rectal bleeding.  Abnormal CT scan consistent with ischemic colitis.  She improved with supportive care.  She did describe irregular bowel habits that were not formed for about a month post hospital stay.  Currently describes her bowel habits back to baseline which consists of alternation between constipation and diarrhea.  Was recommended she have colonoscopy.  In terms of GERD, she continues on omeprazole.  This controls symptoms.  She will get significant symptoms off medication.  No significant recurrent dysphagia.  Hospital laboratories and x-rays reviewed.  She is on Ozempic  REVIEW OF SYSTEMS:  All non-GI ROS negative.  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 Nicholas Lose   CESAREAN SECTION  1978   FOOT SURGERY Bilateral    NASAL SINUS SURGERY     OOPHORECTOMY     PARTIAL COLECTOMY     -RSO   ROTATOR CUFF REPAIR Left    TOTAL KNEE ARTHROPLASTY Bilateral 2002, 2004    Social History Meredith Hayden  reports that she has never smoked. She has never used smokeless tobacco. She reports that she does not drink alcohol and does not use drugs.  family history includes COPD in her mother; Cancer in an other family member; Diabetes in her brother, maternal aunt, mother, sister, and sister; Heart attack (age of onset: 79) in her sister; Heart attack (age of onset: 83) in her father; Hyperlipidemia in an other family member; Hypertension in her father, sister, sister, and another family member; Liver cancer in her maternal grandmother; Ovarian cancer in her maternal aunt; Stomach cancer in her maternal grandmother.  Allergies  Allergen Reactions   Jardiance [Empagliflozin] Other (See Comments)    Vaginal yeast infections   Oxycodone Hcl Anaphylaxis    REACTION: unspecified  Oxycodone-Acetaminophen Anaphylaxis    Patient states that she can regular tylenol without any problems   Sulfamethoxazole     REACTION: rash   Benzodiazepines    Trulicity [Dulaglutide] Other (See Comments)    Upper GI pain   Codeine Phosphate Itching    REACTION: unspecified   Erythromycin Other (See Comments)    Dizzy and sweaty, vomiting, diarrhea    Metformin And Related Diarrhea   Morphine Sulfate Itching    Tolerated hydromorphone    Tylenol [Acetaminophen] Other (See Comments)    Fatty liver       PHYSICAL  EXAMINATION: Vital signs: BP 112/70   Pulse 83   Ht 5\' 2"  (1.575 m)   Wt 233 lb (105.7 kg)   LMP 12/31/1993   BMI 42.62 kg/m   Constitutional: generally well-appearing, no acute distress Psychiatric: alert and oriented x3, cooperative Eyes: extraocular movements intact, anicteric, conjunctiva pink Mouth: oral pharynx moist, no lesions Neck: supple no lymphadenopathy Cardiovascular: heart regular rate and rhythm, no murmur Lungs: clear to auscultation bilaterally Abdomen: soft, obese, mild left lower quadrant tenderness, nondistended, no obvious ascites, no peritoneal signs, normal bowel sounds, no organomegaly Rectal: Omitted Extremities: no clubbing, cyanosis, or lower extremity edema bilaterally Skin: no lesions on visible extremities Neuro: No focal deficits.  Cranial nerves intact  ASSESSMENT:  1.  Hospitalization for abdominal pain and rectal bleeding.  Presentation most consistent with ischemic colitis. 2.  Prior colonoscopy 2016 2017 negative for neoplasia 3.  GERD complicated by peptic stricture.  Symptoms for the most part controlled with omeprazole.  No significant recurrent dysphagia. 4.  Multiple significant general medical problems.  Under the care of Dr. Yetta Barre. 5.  IBS.  Alternating.  PLAN:  1.  Schedule colonoscopy to evaluate abnormal CT scan rectal bleeding.The nature of the procedure, as well as the risks, benefits, and alternatives were carefully and thoroughly reviewed with the patient. Ample time for discussion and questions allowed. The patient understood, was satisfied, and agreed to proceed. 2.  Hold Ozempic for at least 1 week prior to the procedures.  She understands. 3.  Reflux precautions 4.  Continue omeprazole 5.  Recommend Citrucel 2 tablespoons daily for alternating bowel habits A total time of 40 minutes was spent preparing to see the patient, reviewing emerita data, obtaining comprehensive history, performing medically appropriate physical  examination, counseling and educating the patient regarding the above listed issues, ordering endoscopic procedure, and documenting clinical information in the health record

## 2023-12-14 ENCOUNTER — Other Ambulatory Visit: Payer: Self-pay | Admitting: Interventional Cardiology

## 2023-12-30 ENCOUNTER — Encounter: Payer: Self-pay | Admitting: Internal Medicine

## 2023-12-30 ENCOUNTER — Ambulatory Visit (INDEPENDENT_AMBULATORY_CARE_PROVIDER_SITE_OTHER): Payer: Medicare Other | Admitting: Internal Medicine

## 2023-12-30 VITALS — BP 96/62 | HR 81 | Temp 98.4°F | Ht 62.0 in | Wt 231.8 lb

## 2023-12-30 DIAGNOSIS — R9431 Abnormal electrocardiogram [ECG] [EKG]: Secondary | ICD-10-CM | POA: Insufficient documentation

## 2023-12-30 DIAGNOSIS — N3000 Acute cystitis without hematuria: Secondary | ICD-10-CM | POA: Diagnosis not present

## 2023-12-30 DIAGNOSIS — E1159 Type 2 diabetes mellitus with other circulatory complications: Secondary | ICD-10-CM | POA: Diagnosis not present

## 2023-12-30 DIAGNOSIS — I471 Supraventricular tachycardia, unspecified: Secondary | ICD-10-CM

## 2023-12-30 DIAGNOSIS — E039 Hypothyroidism, unspecified: Secondary | ICD-10-CM

## 2023-12-30 DIAGNOSIS — Z794 Long term (current) use of insulin: Secondary | ICD-10-CM | POA: Diagnosis not present

## 2023-12-30 DIAGNOSIS — I950 Idiopathic hypotension: Secondary | ICD-10-CM | POA: Insufficient documentation

## 2023-12-30 DIAGNOSIS — E11319 Type 2 diabetes mellitus with unspecified diabetic retinopathy without macular edema: Secondary | ICD-10-CM

## 2023-12-30 DIAGNOSIS — E785 Hyperlipidemia, unspecified: Secondary | ICD-10-CM | POA: Diagnosis not present

## 2023-12-30 DIAGNOSIS — N1831 Chronic kidney disease, stage 3a: Secondary | ICD-10-CM

## 2023-12-30 DIAGNOSIS — E119 Type 2 diabetes mellitus without complications: Secondary | ICD-10-CM

## 2023-12-30 LAB — CBC WITH DIFFERENTIAL/PLATELET
Basophils Absolute: 0.1 10*3/uL (ref 0.0–0.1)
Basophils Relative: 1 % (ref 0.0–3.0)
Eosinophils Absolute: 0.2 10*3/uL (ref 0.0–0.7)
Eosinophils Relative: 2.1 % (ref 0.0–5.0)
HCT: 40.9 % (ref 36.0–46.0)
Hemoglobin: 13.4 g/dL (ref 12.0–15.0)
Lymphocytes Relative: 23.5 % (ref 12.0–46.0)
Lymphs Abs: 2.3 10*3/uL (ref 0.7–4.0)
MCHC: 32.7 g/dL (ref 30.0–36.0)
MCV: 95.6 fL (ref 78.0–100.0)
Monocytes Absolute: 0.8 10*3/uL (ref 0.1–1.0)
Monocytes Relative: 8.8 % (ref 3.0–12.0)
Neutro Abs: 6.2 10*3/uL (ref 1.4–7.7)
Neutrophils Relative %: 64.6 % (ref 43.0–77.0)
Platelets: 178 10*3/uL (ref 150.0–400.0)
RBC: 4.28 Mil/uL (ref 3.87–5.11)
RDW: 13 % (ref 11.5–15.5)
WBC: 9.6 10*3/uL (ref 4.0–10.5)

## 2023-12-30 LAB — URINALYSIS, ROUTINE W REFLEX MICROSCOPIC
Bilirubin Urine: NEGATIVE
Hgb urine dipstick: NEGATIVE
Ketones, ur: NEGATIVE
Nitrite: POSITIVE — AB
Specific Gravity, Urine: 1.02 (ref 1.000–1.030)
Urine Glucose: NEGATIVE
Urobilinogen, UA: 0.2 (ref 0.0–1.0)
pH: 5.5 (ref 5.0–8.0)

## 2023-12-30 LAB — BASIC METABOLIC PANEL
BUN: 34 mg/dL — ABNORMAL HIGH (ref 6–23)
CO2: 27 meq/L (ref 19–32)
Calcium: 9.5 mg/dL (ref 8.4–10.5)
Chloride: 102 meq/L (ref 96–112)
Creatinine, Ser: 2.11 mg/dL — ABNORMAL HIGH (ref 0.40–1.20)
GFR: 23.18 mL/min — ABNORMAL LOW (ref >60.00)
Glucose, Bld: 111 mg/dL — ABNORMAL HIGH (ref 70–99)
Potassium: 5.2 meq/L — ABNORMAL HIGH (ref 3.5–5.1)
Sodium: 138 meq/L (ref 135–145)

## 2023-12-30 LAB — BRAIN NATRIURETIC PEPTIDE: Pro B Natriuretic peptide (BNP): 7 pg/mL (ref 0.0–100.0)

## 2023-12-30 LAB — CORTISOL: Cortisol, Plasma: 16.1 ug/dL

## 2023-12-30 LAB — TSH: TSH: 0.25 u[IU]/mL — ABNORMAL LOW (ref 0.35–5.50)

## 2023-12-30 LAB — TROPONIN I (HIGH SENSITIVITY): High Sens Troponin I: 3 ng/L (ref 2–17)

## 2023-12-30 LAB — HEMOGLOBIN A1C: Hgb A1c MFr Bld: 6.8 % — ABNORMAL HIGH (ref 4.6–6.5)

## 2023-12-30 NOTE — Patient Instructions (Signed)

## 2023-12-31 ENCOUNTER — Telehealth: Payer: Self-pay | Admitting: Pharmacist

## 2023-12-31 ENCOUNTER — Other Ambulatory Visit: Payer: Self-pay | Admitting: Internal Medicine

## 2023-12-31 DIAGNOSIS — E119 Type 2 diabetes mellitus without complications: Secondary | ICD-10-CM | POA: Insufficient documentation

## 2023-12-31 MED ORDER — UNITHROID 50 MCG PO TABS: 50.0000 ug | ORAL_TABLET | Freq: Every day | ORAL | 0 refills | Status: DC

## 2023-12-31 MED ORDER — ROSUVASTATIN CALCIUM 20 MG PO TABS: 20.0000 mg | ORAL_TABLET | Freq: Every day | ORAL | 0 refills | Status: DC

## 2023-12-31 MED ORDER — CIPROFLOXACIN HCL 250 MG PO TABS: 250.0000 mg | ORAL_TABLET | Freq: Two times a day (BID) | ORAL | 0 refills | Status: DC

## 2023-12-31 NOTE — Telephone Encounter (Signed)
 Spoke to patient and was able to schedule pharmacy referral telephone appt for 01/03/24 at 2 PM.  Arbutus Leas, PharmD, BCPS, CPP Clinical Pharmacist Practitioner [facility] [phone]

## 2023-12-31 NOTE — Telephone Encounter (Signed)
 Called patient to attempt to schedule pharmacy referral sent by PCP yesterday. Left voicemail with number for call back.  Darrelyn Drum, PharmD, BCPS, CPP Clinical Pharmacist Practitioner [facility] [phone]

## 2024-01-03 ENCOUNTER — Other Ambulatory Visit: Payer: Medicare Other | Admitting: Pharmacist

## 2024-01-03 DIAGNOSIS — E119 Type 2 diabetes mellitus without complications: Secondary | ICD-10-CM

## 2024-01-03 NOTE — Progress Notes (Signed)
   01/03/2024 Name: Meredith Hayden MRN: [id number] DOB: Jan 24, 1950  No chief complaint on file.   Meredith Hayden is a 74 y.o. year old female who presented for a telephone visit.   They were referred to the pharmacist by their PCP for assistance in managing diabetes and hypotension/hyperkalemia .    Subjective:  Care Team: Primary Care Provider: Joshua Debby CROME, MD ; Next Scheduled Visit: none scheduled   Medication Access/Adherence  Current Pharmacy:  [facility] - [place], [place] - [address] [place] [address] Phone: [phone] Fax: [phone]  [facility] [place], [place] - [address] [place] [address] Phone: [phone] Fax: [phone]  [facility] - [place], [place] - [address] [place] [address] Phone: [phone] Fax: [phone]   Patient reports affordability concerns with their medications: No  Patient reports access/transportation concerns to their pharmacy: No  Patient reports adherence concerns with their medications:  Yes  Pt notes she needs her [employer] sent to [facility]   Diabetes:  Current medications: Toujeo  10 units daily, Humalog  SSI, Ozempic (not currently taking)  Current glucose readings: uses Freestyle Libre to monitor BG very closely *Needs new Libre sensors  Hypertension:  Current medications: metoprolol  tartrate 25 mg 1/2 tablet BID    Patient denies hypotensive s/sx including dizziness, lightheadedness.      Objective:  BP Readings from Last 3 Encounters:  12/30/23 96/62  12/13/23 112/70  09/24/23 126/78     Lab Results  Component Value Date   HGBA1C 6.8 (H) 12/30/2023    Lab Results  Component Value Date   CREATININE 2.11 (H) 12/30/2023   BUN 34 (H) 12/30/2023   NA 138 12/30/2023   K 5.2 No hemolysis seen (H) 12/30/2023   CL 102  12/30/2023   CO2 27 12/30/2023    Lab Results  Component Value Date   CHOL 150 08/27/2023   HDL 50 08/27/2023   LDLCALC 74 08/27/2023   LDLDIRECT 70.0 07/30/2022   TRIG 156 08/27/2023   CHOLHDL 3 07/30/2022    Medications Reviewed Today   Medications were not reviewed in this encounter      Assessment/Plan:   Diabetes: - Currently controlled - Continue current regimen. She will monitor if her BG starts to go up after stopping Ozempic. - Will send order for [employer] though [employer]     Hypertension: - Currently controlled - Recommend to continue current regimen     Follow Up Plan: PRN  Darrelyn Drum, PharmD, BCPS, CPP Clinical Pharmacist Practitioner [facility] at [facility] [phone]

## 2024-01-05 ENCOUNTER — Other Ambulatory Visit: Payer: Self-pay | Admitting: Internal Medicine

## 2024-01-06 NOTE — Patient Instructions (Signed)
 It was a pleasure speaking with you today!  Freestyle [employer] order was sent to DME that is in network with [employer].  Feel free to call with any questions or concerns!  Darrelyn Drum, PharmD, BCPS [facility] Clinical Pharmacist [facility] [phone]

## 2024-01-07 ENCOUNTER — Ambulatory Visit
Admission: RE | Admit: 2024-01-07 | Discharge: 2024-01-07 | Disposition: A | Discharge status: Home / Self Care | Payer: Medicare Other | Source: Ambulatory Visit | Attending: Internal Medicine | Admitting: Internal Medicine

## 2024-01-07 ENCOUNTER — Encounter: Payer: Self-pay | Admitting: Internal Medicine

## 2024-01-07 DIAGNOSIS — Z1231 Encounter for screening mammogram for malignant neoplasm of breast: Secondary | ICD-10-CM

## 2024-01-14 ENCOUNTER — Ambulatory Visit: Payer: Medicare Other | Admitting: Internal Medicine

## 2024-01-14 ENCOUNTER — Encounter: Payer: Self-pay | Admitting: Internal Medicine

## 2024-01-14 ENCOUNTER — Other Ambulatory Visit: Payer: Self-pay | Admitting: Internal Medicine

## 2024-01-14 VITALS — BP 126/59 | HR 65 | Temp 97.2°F | Resp 15 | Ht 62.0 in | Wt 233.0 lb

## 2024-01-14 DIAGNOSIS — E11319 Type 2 diabetes mellitus with unspecified diabetic retinopathy without macular edema: Secondary | ICD-10-CM

## 2024-01-14 DIAGNOSIS — R933 Abnormal findings on diagnostic imaging of other parts of digestive tract: Secondary | ICD-10-CM

## 2024-01-14 DIAGNOSIS — I10 Essential (primary) hypertension: Secondary | ICD-10-CM | POA: Diagnosis not present

## 2024-01-14 DIAGNOSIS — K625 Hemorrhage of anus and rectum: Secondary | ICD-10-CM

## 2024-01-14 DIAGNOSIS — N1831 Chronic kidney disease, stage 3a: Secondary | ICD-10-CM

## 2024-01-14 DIAGNOSIS — Z980 Intestinal bypass and anastomosis status: Secondary | ICD-10-CM

## 2024-01-14 DIAGNOSIS — K559 Vascular disorder of intestine, unspecified: Secondary | ICD-10-CM | POA: Diagnosis not present

## 2024-01-14 DIAGNOSIS — E119 Type 2 diabetes mellitus without complications: Secondary | ICD-10-CM | POA: Diagnosis not present

## 2024-01-14 MED ORDER — SODIUM CHLORIDE 0.9 % IV SOLN: 500.0000 mL | Freq: Once | INTRAVENOUS | Status: DC

## 2024-01-14 NOTE — Progress Notes (Signed)
 Report to PACU, RN, vss, BBS= Clear.

## 2024-01-14 NOTE — Progress Notes (Signed)
 Pt's states no medical or surgical changes since previsit or office visit.

## 2024-01-14 NOTE — Progress Notes (Signed)
 Expand All Collapse All HISTORY OF PRESENT ILLNESS:   Meredith Hayden is a 74 y.o. female, retired engineer, civil (consulting) in [place], with multiple medical problems as listed below.  She has been seen in this office for GERD complicated by peptic stricture requiring esophageal dilation and irritable bowel syndrome with alternating bowel habits.  She presents today after being hospitalized in September with acute ischemic colitis.   I last saw the patient November 14, 2016 when she underwent screening colonoscopy.  The examination was normal with evidence of prior sigmoid colon resection.  She also underwent upper endoscopy at that time for GERD and dysphagia.  She was found to have a benign peptic stricture which was dilated with 54 French Maloney dilator.   Patient was hospitalized with abdominal pain and rectal bleeding.  Abnormal CT scan consistent with ischemic colitis.  She improved with supportive care.  She did describe irregular bowel habits that were not formed for about a month post hospital stay.  Currently describes her bowel habits back to baseline which consists of alternation between constipation and diarrhea.  Was recommended she have colonoscopy.  In terms of GERD, she continues on omeprazole.  This controls symptoms.  She will get significant symptoms off medication.  No significant recurrent dysphagia.   Hospital laboratories and x-rays reviewed.  She is on Ozempic   REVIEW OF SYSTEMS:   All non-GI ROS negative.       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 Cary   CESAREAN SECTION   1978   FOOT SURGERY Bilateral     NASAL SINUS SURGERY       OOPHORECTOMY       PARTIAL COLECTOMY        -RSO   ROTATOR CUFF REPAIR Left     TOTAL KNEE ARTHROPLASTY Bilateral 2002, 2004          Social History Meredith Hayden  reports that she has never smoked. She has never used smokeless tobacco. She reports that she does not drink alcohol and does not use drugs.   family history includes COPD in her mother; Cancer in an other family member; Diabetes in her brother, maternal aunt, mother, sister, and sister; Heart attack (age of onset: 41) in her  sister; Heart attack (age of onset: 25) in her father; Hyperlipidemia in an other family member; Hypertension in her father, sister, sister, and another family member; Liver cancer in her maternal grandmother; Ovarian cancer in her maternal aunt; Stomach cancer in her maternal grandmother.   Allergies       Allergies  Allergen Reactions   Jardiance  [Empagliflozin ] Other (See Comments)      Vaginal yeast infections   Oxycodone Hcl Anaphylaxis      REACTION: unspecified   Oxycodone-Acetaminophen  Anaphylaxis      Patient states that she can regular tylenol  without any problems   Sulfamethoxazole        REACTION: rash   Benzodiazepines     Trulicity  [Dulaglutide ] Other (See Comments)      Upper GI pain   Codeine Phosphate Itching      REACTION: unspecified   Erythromycin Other (See Comments)      Dizzy and sweaty, vomiting, diarrhea    Metformin  And Related Diarrhea   Morphine Sulfate Itching       Tolerated hydromorphone     Tylenol  [Acetaminophen ] Other (See Comments)      Fatty liver            PHYSICAL EXAMINATION: Vital signs: BP 112/70   Pulse 83   Ht 5' 2 (1.575 m)   Wt 233 lb (105.7 kg)   LMP 12/31/1993   BMI 42.62 kg/m   Constitutional: generally well-appearing, no acute distress Psychiatric: alert and oriented x3, cooperative Eyes: extraocular movements intact, anicteric, conjunctiva pink Mouth: oral pharynx moist, no lesions Neck: supple no lymphadenopathy Cardiovascular: heart regular rate and rhythm, no murmur Lungs: clear to auscultation bilaterally Abdomen: soft, obese, mild left lower quadrant tenderness, nondistended, no obvious ascites, no peritoneal signs, normal bowel sounds, no organomegaly Rectal: Omitted Extremities: no clubbing, cyanosis, or lower extremity edema bilaterally Skin: no lesions on visible extremities Neuro: No focal deficits.  Cranial nerves intact   ASSESSMENT:   1.  Hospitalization for abdominal pain and rectal bleeding.  Presentation most consistent with ischemic colitis. 2.  Prior colonoscopy 2016 2017 negative for neoplasia 3.  GERD complicated by peptic stricture.  Symptoms for the most part controlled with omeprazole.  No significant recurrent dysphagia. 4.  Multiple significant general medical problems.  Under the care of Dr. Joshua. 5.  IBS.  Alternating.   PLAN:   1.  Schedule colonoscopy to evaluate abnormal CT scan rectal bleeding.The nature of the procedure, as well as the risks, benefits, and alternatives were carefully and thoroughly reviewed with the patient. Ample time for discussion and questions allowed. The patient understood, was satisfied, and agreed to proceed. 2.  Hold Ozempic for at least 1 week prior to the procedures.  She understands. 3.  Reflux precautions 4.  Continue omeprazole 5.  Recommend Citrucel 2 tablespoons daily for alternating bowel habits

## 2024-01-14 NOTE — Op Note (Signed)
 [facility] Patient Name: Meredith Hayden Procedure Date: 01/14/2024 3:05 PM MRN: [id number] Endoscopist: Norleen SAILOR. Abran , MD, [id number] Age: 74 Referring MD:  Date of Birth: June 27, 1950 Gender: Female Account #: [id number] Procedure:                Colonoscopy Indications:              Abdominal pain, Rectal bleeding, Abnormal CT of the                            GI tract. Previous colonoscopy 2006 and 2016 were                            negative for neoplasia Medicines:                Monitored Anesthesia Care Procedure:                Pre-Anesthesia Assessment:                           - Prior to the procedure, a History and Physical                            was performed, and patient medications and                            allergies were reviewed. The patient's tolerance of                            previous anesthesia was also reviewed. The risks                            and benefits of the procedure and the sedation                            options and risks were discussed with the patient.                            All questions were answered, and informed consent                            was obtained. Prior Anticoagulants: The patient has                            taken no anticoagulant or antiplatelet agents. ASA                            Grade Assessment: II - A patient with mild systemic                            disease. After reviewing the risks and benefits,                            the patient was deemed in satisfactory condition to  undergo the procedure.                           After obtaining informed consent, the colonoscope                            was passed under direct vision. Throughout the                            procedure, the patient's blood pressure, pulse, and                            oxygen saturations were monitored continuously. The                            CF [id number] #[id number] was introduced  through the anus                            and advanced to the the cecum, identified by                            appendiceal orifice and ileocecal valve. The                            ileocecal valve, appendiceal orifice, and rectum                            were photographed. The quality of the bowel                            preparation was excellent. The colonoscopy was                            performed without difficulty. The patient tolerated                            the procedure well. The bowel preparation used was                            SUPREP via split dose instruction. Scope In: 3:18:08 PM Scope Out: 3:26:08 PM Scope Withdrawal Time: 0 hours 5 minutes 58 seconds  Total Procedure Duration: 0 hours 8 minutes 0 seconds  Findings:                 The entire examined colon appeared normal on direct                            and retroflexion views. Surgical anastomosis at 20                            cm was unremarkable. Complications:            No immediate complications. Estimated blood loss:  None. Estimated Blood Loss:     Estimated blood loss: none. Impression:               - The entire examined colon is normal on direct and                            retroflexion views. Prior sigmoid colectomy.                           - No specimens collected. Recommendation:           - Repeat colonoscopy is not recommended for                            screening purposes.                           - Patient has a contact number available for                            emergencies. The signs and symptoms of potential                            delayed complications were discussed with the                            patient. Return to normal activities tomorrow.                            Written discharge instructions were provided to the                            patient.                           - Resume previous diet.                            - Continue present medications. Norleen SAILOR. Abran, MD 01/14/2024 3:31:51 PM This report has been signed electronically.

## 2024-01-14 NOTE — Patient Instructions (Signed)
-   no repeat colonoscopy recommended for surveillance recommended.  -Continue present medications  YOU HAD AN ENDOSCOPIC PROCEDURE TODAY AT THE [facility]:   Refer to the procedure report that was given to you for any specific questions about what was found during the examination.  If the procedure report does not answer your questions, please call your gastroenterologist to clarify.  If you requested that your care partner not be given the details of your procedure findings, then the procedure report has been included in a sealed envelope for you to review at your convenience later.  YOU SHOULD EXPECT: Some feelings of bloating in the abdomen. Passage of more gas than usual.  Walking can help get rid of the air that was put into your GI tract during the procedure and reduce the bloating. If you had a lower endoscopy (such as a colonoscopy or flexible sigmoidoscopy) you may notice spotting of blood in your stool or on the toilet paper. If you underwent a bowel prep for your procedure, you may not have a normal bowel movement for a few days.  Please Note:  You might notice some irritation and congestion in your nose or some drainage.  This is from the oxygen used during your procedure.  There is no need for concern and it should clear up in a day or so.  SYMPTOMS TO REPORT IMMEDIATELY:  Following lower endoscopy (colonoscopy or flexible sigmoidoscopy):  Excessive amounts of blood in the stool  Significant tenderness or worsening of abdominal pains  Swelling of the abdomen that is new, acute  Fever of 100F or higher   For urgent or emergent issues, a gastroenterologist can be reached at any hour by calling [phone]. Do not use MyChart messaging for urgent concerns.    DIET:  We do recommend a small meal at first, but then you may proceed to your regular diet.  Drink plenty of fluids but you should avoid alcoholic beverages for 24 hours.  ACTIVITY:  You should plan to take it  easy for the rest of today and you should NOT DRIVE or use heavy machinery until tomorrow (because of the sedation medicines used during the test).    FOLLOW UP: Our staff will call the number listed on your records the next business day following your procedure.  We will call around 7:15- 8:00 am to check on you and address any questions or concerns that you may have regarding the information given to you following your procedure. If we do not reach you, we will leave a message.     If any biopsies were taken you will be contacted by phone or by letter within the next 1-3 weeks.  Please call us  at [phone] if you have not heard about the biopsies in 3 weeks.    SIGNATURES/CONFIDENTIALITY: You and/or your care partner have signed paperwork which will be entered into your electronic medical record.  These signatures attest to the fact that that the information above on your After Visit Summary has been reviewed and is understood.  Full responsibility of the confidentiality of this discharge information lies with you and/or your care-partner.

## 2024-01-15 ENCOUNTER — Telehealth: Payer: Self-pay

## 2024-01-15 NOTE — Telephone Encounter (Signed)
 Left message on answering machine.

## 2024-01-16 ENCOUNTER — Encounter: Payer: Self-pay | Admitting: Internal Medicine

## 2024-01-16 ENCOUNTER — Ambulatory Visit (HOSPITAL_COMMUNITY)
Admission: RE | Admit: 2024-01-16 | Discharge: 2024-01-16 | Disposition: A | Discharge status: Home / Self Care | Payer: Medicare Other | Source: Ambulatory Visit | Attending: Internal Medicine | Admitting: Internal Medicine

## 2024-01-16 DIAGNOSIS — I950 Idiopathic hypotension: Secondary | ICD-10-CM | POA: Diagnosis not present

## 2024-01-16 DIAGNOSIS — R9431 Abnormal electrocardiogram [ECG] [EKG]: Secondary | ICD-10-CM | POA: Insufficient documentation

## 2024-01-16 DIAGNOSIS — I10 Essential (primary) hypertension: Secondary | ICD-10-CM | POA: Insufficient documentation

## 2024-01-16 DIAGNOSIS — E785 Hyperlipidemia, unspecified: Secondary | ICD-10-CM | POA: Diagnosis not present

## 2024-01-16 DIAGNOSIS — E119 Type 2 diabetes mellitus without complications: Secondary | ICD-10-CM | POA: Insufficient documentation

## 2024-01-16 LAB — ECHOCARDIOGRAM COMPLETE
AR max vel: 2.54 cm2
AV Area VTI: 2.75 cm2
AV Area mean vel: 2.5 cm2
AV Mean grad: 4 mm[Hg]
AV Peak grad: 6.3 mm[Hg]
Ao pk vel: 1.25 m/s
Area-P 1/2: 3.37 cm2
S' Lateral: 3.5 cm

## 2024-01-16 NOTE — Progress Notes (Signed)
*  PRELIMINARY RESULTS* Echocardiogram 2D Echocardiogram has been performed.  Meredith Hayden 01/16/2024, 1:35 PM

## 2024-03-02 ENCOUNTER — Other Ambulatory Visit: Payer: Self-pay | Admitting: Internal Medicine

## 2024-03-02 DIAGNOSIS — E039 Hypothyroidism, unspecified: Secondary | ICD-10-CM

## 2024-03-05 ENCOUNTER — Other Ambulatory Visit: Payer: Self-pay | Admitting: Internal Medicine

## 2024-03-05 DIAGNOSIS — E785 Hyperlipidemia, unspecified: Secondary | ICD-10-CM

## 2024-03-06 DIAGNOSIS — Z794 Long term (current) use of insulin: Secondary | ICD-10-CM | POA: Diagnosis not present

## 2024-03-06 DIAGNOSIS — E1140 Type 2 diabetes mellitus with diabetic neuropathy, unspecified: Secondary | ICD-10-CM | POA: Diagnosis not present

## 2024-03-06 DIAGNOSIS — K760 Fatty (change of) liver, not elsewhere classified: Secondary | ICD-10-CM | POA: Diagnosis not present

## 2024-03-06 DIAGNOSIS — N184 Chronic kidney disease, stage 4 (severe): Secondary | ICD-10-CM | POA: Diagnosis not present

## 2024-03-20 ENCOUNTER — Emergency Department (HOSPITAL_COMMUNITY)

## 2024-03-20 ENCOUNTER — Emergency Department (HOSPITAL_COMMUNITY): Admission: EM | Admit: 2024-03-20 | Discharge: 2024-03-20 | Disposition: A | Discharge status: Home / Self Care

## 2024-03-20 ENCOUNTER — Other Ambulatory Visit: Payer: Self-pay

## 2024-03-20 DIAGNOSIS — I700 Atherosclerosis of aorta: Secondary | ICD-10-CM | POA: Diagnosis not present

## 2024-03-20 DIAGNOSIS — R0602 Shortness of breath: Secondary | ICD-10-CM | POA: Diagnosis not present

## 2024-03-20 DIAGNOSIS — Z794 Long term (current) use of insulin: Secondary | ICD-10-CM | POA: Insufficient documentation

## 2024-03-20 DIAGNOSIS — I471 Supraventricular tachycardia, unspecified: Secondary | ICD-10-CM | POA: Insufficient documentation

## 2024-03-20 DIAGNOSIS — I5030 Unspecified diastolic (congestive) heart failure: Secondary | ICD-10-CM | POA: Diagnosis not present

## 2024-03-20 DIAGNOSIS — R110 Nausea: Secondary | ICD-10-CM | POA: Diagnosis not present

## 2024-03-20 DIAGNOSIS — R079 Chest pain, unspecified: Secondary | ICD-10-CM | POA: Diagnosis not present

## 2024-03-20 DIAGNOSIS — Z79899 Other long term (current) drug therapy: Secondary | ICD-10-CM | POA: Diagnosis not present

## 2024-03-20 DIAGNOSIS — I2510 Atherosclerotic heart disease of native coronary artery without angina pectoris: Secondary | ICD-10-CM | POA: Diagnosis not present

## 2024-03-20 DIAGNOSIS — R000 Tachycardia, unspecified: Secondary | ICD-10-CM | POA: Diagnosis not present

## 2024-03-20 LAB — CBC
HCT: 43.2 % (ref 36.0–46.0)
Hemoglobin: 13.9 g/dL (ref 12.0–15.0)
MCH: 30.8 pg (ref 26.0–34.0)
MCHC: 32.2 g/dL (ref 30.0–36.0)
MCV: 95.6 fL (ref 80.0–100.0)
Platelets: 230 10*3/uL (ref 150–400)
RBC: 4.52 MIL/uL (ref 3.87–5.11)
RDW: 12.7 % (ref 11.5–15.5)
WBC: 13.7 10*3/uL — ABNORMAL HIGH (ref 4.0–10.5)
nRBC: 0 % (ref 0.0–0.2)

## 2024-03-20 LAB — D-DIMER, QUANTITATIVE: D-Dimer, Quant: 2.77 ug{FEU}/mL — ABNORMAL HIGH (ref 0.00–0.50)

## 2024-03-20 LAB — BASIC METABOLIC PANEL
Anion gap: 12 (ref 5–15)
BUN: 20 mg/dL (ref 8–23)
CO2: 26 mmol/L (ref 22–32)
Calcium: 9 mg/dL (ref 8.9–10.3)
Chloride: 101 mmol/L (ref 98–111)
Creatinine, Ser: 1.54 mg/dL — ABNORMAL HIGH (ref 0.44–1.00)
GFR, Estimated: 36 mL/min — ABNORMAL LOW (ref >60)
Glucose, Bld: 164 mg/dL — ABNORMAL HIGH (ref 70–99)
Potassium: 4.3 mmol/L (ref 3.5–5.1)
Sodium: 139 mmol/L (ref 135–145)

## 2024-03-20 LAB — TROPONIN I (HIGH SENSITIVITY)
Troponin I (High Sensitivity): 9 ng/L (ref <18)
Troponin I (High Sensitivity): 38 ng/L — ABNORMAL HIGH (ref <18)

## 2024-03-20 LAB — MAGNESIUM: Magnesium: 1.7 mg/dL (ref 1.7–2.4)

## 2024-03-20 MED ORDER — METOPROLOL TARTRATE 25 MG PO TABS
25.0000 mg | ORAL_TABLET | Freq: Once | ORAL | Status: AC
  Administered 2024-03-20: 25 mg via ORAL
  Filled 2024-03-20: qty 1

## 2024-03-20 MED ORDER — IOHEXOL 350 MG/ML SOLN
50.0000 mL | Freq: Once | INTRAVENOUS | Status: AC | PRN
  Administered 2024-03-20: 50 mL via INTRAVENOUS

## 2024-03-20 MED ORDER — DILTIAZEM HCL 25 MG/5ML IV SOLN
15.0000 mg | Freq: Once | INTRAVENOUS | Status: AC
  Administered 2024-03-20: 15 mg via INTRAVENOUS
  Filled 2024-03-20: qty 5

## 2024-03-20 MED ORDER — SODIUM CHLORIDE 0.9 % IV BOLUS
500.0000 mL | Freq: Once | INTRAVENOUS | Status: AC
  Administered 2024-03-20: 500 mL via INTRAVENOUS

## 2024-03-20 NOTE — Discharge Instructions (Signed)
 Please follow-up with your cardiologist as soon as possible.  Return immediately felt fevers, chills, chest pain returns, heart begins racing again, you pass out, develop shortness of breath or you develop any new or worsening symptoms that are concerning to you.  Continue to take your metoprolol.

## 2024-03-20 NOTE — ED Provider Notes (Signed)
 [facility] AT [facility] Provider Note   CSN: [id number] Arrival date & time: 03/20/24  4098     History  Chief Complaint  Patient presents with   Chest Pain    Meredith Hayden is a 74 y.o. female.  This is a 74 year old female presenting emergency department for chest pain.  Symptoms started 2:00 this morning while she was at rest.  Abrupt onset.  Associated with shortness of breath and nausea.  She took her heart rate and noted to be elevated.  Reports history of SVT, diastolic heart failure.  Does not smoke.  She does take metoprolol, has not missed a dose.  Denies any preceding viral illnesses.  Low risk for PE/DVT based on Wells criteria   Chest Pain      Home Medications Prior to Admission medications   Medication Sig Start Date End Date Taking? Authorizing Provider  cetirizine (ZYRTEC) 10 MG tablet Take 10 mg by mouth daily.     [provider]  Cholecalciferol (VITAMIN D3) 125 MCG (5000 UT) TBDP Take 5,000 Units by mouth 3 (three) times a week.    [provider]  clobetasol ointment (TEMOVATE) 0.05 % Apply 1 Application topically daily.    [provider]  Continuous Blood Gluc Receiver (FREESTYLE LIBRE 2 READER) DEVI 1 Act by Does not apply route daily. 07/17/21   Etta Grandchild, MD  Continuous Blood Gluc Sensor (FREESTYLE LIBRE 2 SENSOR) MISC 1 Act by Does not apply route daily. 07/17/21   Etta Grandchild, MD  dicyclomine (BENTYL) 10 MG capsule TAKE 1 CAPSULE BY MOUTH 3 TIMES  DAILY BEFORE MEALS Patient taking differently: Take 10 mg by mouth 3 (three) times daily as needed for spasms. 11/20/21   Etta Grandchild, MD  fluticasone (FLONASE) 50 MCG/ACT nasal spray Place 1 spray into both nostrils daily.    [provider]  gabapentin (NEURONTIN) 600 MG tablet Take 600 mg by mouth 2 (two) times daily. 10/31/21   [provider]  Glucagon (GVOKE HYPOPEN 2-PACK) 1 MG/0.2ML SOAJ Inject 1 Act  into the skin daily as needed. 09/24/23   Etta Grandchild, MD  HUMALOG KWIKPEN 100 UNIT/ML KwikPen INJECT SUBCUTANEOUSLY 16 UNITS 3 TIMES DAILY 01/07/24   Etta Grandchild, MD  Insulin Glargine (TOUJEO SOLOSTAR [place]) Inject 10 Units into the skin daily.    [provider]  Insulin Pen Needle (UNIFINE PENTIPS PLUS) 33G X 4 MM MISC Inject 1 Act into the skin 4 (four) times daily. 01/31/23   Etta Grandchild, MD  ipratropium (ATROVENT HFA) 17 MCG/ACT inhaler USE 2 PUFFS EVERY 4 HOURS  AS NEEDED FOR WHEEZING. 03/26/19   Etta Grandchild, MD  Lancets [facility] DELICA PLUS LANCET33G) MISC USE AS DIRECTED TO CHECK BLOOD SUGAR 4 TIMES DAILY AFTER MEALS AND AT BEDTIME. 05/24/20   Etta Grandchild, MD  loperamide (IMODIUM) 2 MG capsule Take 2 mg by mouth as needed for diarrhea or loose stools.    [provider]  metoprolol tartrate (LOPRESSOR) 25 MG tablet TAKE ONE-HALF TABLET BY MOUTH  TWICE DAILY 12/16/23   Sharlene Dory, PA-C  nystatin ointment (MYCOSTATIN) Apply 1 Application topically 2 (two) times daily as needed.    [provider]  omeprazole (PRILOSEC) 20 MG capsule Take 20 mg by mouth daily.    [provider]  [facility] VERIO test strip CHECK BLOOD SUGAR 4 TIMES A DAY. 07/25/20   Etta Grandchild, MD  rosuvastatin (  CRESTOR) 20 MG tablet TAKE 1 TABLET BY MOUTH DAILY 03/06/24   Etta Grandchild, MD  Semaglutide,0.25 or 0.5MG /DOS, (OZEMPIC, 0.25 OR 0.5 MG/DOSE,) 2 MG/3ML SOPN Inject 0.25 mg into the skin once a week. Patient not taking: Reported on 01/03/2024    [provider]  Simethicone 125 MG CAPS Take 1 capsule by mouth daily as needed.    [provider]  thiamine (VITAMIN B-1) 50 MG tablet Take 1 tablet (50 mg total) by mouth daily. 09/28/23   Etta Grandchild, MD  traZODone (DESYREL) 50 MG tablet Take 50 mg by mouth at bedtime. 05/20/23   [provider]  TRINTELLIX 20 MG TABS tablet Take 20 mg by mouth daily. 07/13/23   [provider]   UNITHROID 50 MCG tablet TAKE 1 TABLET BY MOUTH DAILY  BEFORE BREAKFAST 03/02/24   Etta Grandchild, MD  zaleplon (SONATA) 10 MG capsule Take 10 mg by mouth at bedtime as needed for sleep.    [provider]      Allergies    Jardiance [empagliflozin], Oxycodone hcl, Oxycodone-acetaminophen, Sulfamethoxazole, Trulicity [dulaglutide], Benzodiazepines, Codeine phosphate, Erythromycin, Metformin and related, Morphine sulfate, and Tylenol [acetaminophen]    Review of Systems   Review of Systems  Cardiovascular:  Positive for chest pain.    Physical Exam Updated Vital Signs BP 126/80   Pulse 77   Temp 98.4 F (36.9 C) (Oral)   Resp (!) 21   LMP 12/31/1993   SpO2 90%  Physical Exam Vitals and nursing note reviewed.  Constitutional:      General: She is not in acute distress.    Appearance: She is not toxic-appearing.  HENT:     Head: Normocephalic.  Cardiovascular:     Rate and Rhythm: Regular rhythm. Tachycardia present.     Pulses:          Radial pulses are 2+ on the right side and 2+ on the left side.     Heart sounds: Normal heart sounds.  Pulmonary:     Breath sounds: Normal breath sounds.  Musculoskeletal:     Cervical back: Normal range of motion.     Right lower leg: No edema.     Left lower leg: No edema.  Skin:    General: Skin is warm and dry.     Capillary Refill: Capillary refill takes less than 2 seconds.  Neurological:     Mental Status: She is alert and oriented to person, place, and time.     ED Results / Procedures / Treatments   Labs (all labs ordered are listed, but only abnormal results are displayed) Labs Reviewed  BASIC METABOLIC PANEL - Abnormal; Notable for the following components:      Result Value   Glucose, Bld 164 (*)    Creatinine, Ser 1.54 (*)    GFR, Estimated 36 (*)    All other components within normal limits  CBC - Abnormal; Notable for the following components:   WBC 13.7 (*)    All other components within normal limits   D-DIMER, QUANTITATIVE - Abnormal; Notable for the following components:   D-Dimer, Quant 2.77 (*)    All other components within normal limits  TROPONIN I (HIGH SENSITIVITY) - Abnormal; Notable for the following components:   Troponin I (High Sensitivity) 38 (*)    All other components within normal limits  MAGNESIUM  TROPONIN I (HIGH SENSITIVITY)    EKG EKG Interpretation Date/Time:  Friday March 20 2024 10:00:46 EDT Ventricular  Rate:  82 PR Interval:  164 QRS Duration:  105 QT Interval:  401 QTC Calculation: 469 R Axis:   266  Text Interpretation: Sinus rhythm Probable right ventricular hypertrophy Inferior infarct, old Confirmed by Estanislado Pandy [id number]) on 03/20/2024 10:26:50 AM  Radiology CT Angio Chest PE W and/or Wo Contrast Result Date: 03/20/2024 CLINICAL DATA:  Pulmonary embolism (PE) suspected, low to intermediate prob, positive D-dimer. Chest pain. Shortness of breath. EXAM: CT ANGIOGRAPHY CHEST WITH CONTRAST TECHNIQUE: Multidetector CT imaging of the chest was performed using the standard protocol during bolus administration of intravenous contrast. Multiplanar CT image reconstructions and MIPs were obtained to evaluate the vascular anatomy. RADIATION DOSE REDUCTION: This exam was performed according to the departmental dose-optimization program which includes automated exposure control, adjustment of the mA and/or kV according to patient size and/or use of iterative reconstruction technique. CONTRAST:  50mL OMNIPAQUE IOHEXOL 350 MG/ML SOLN COMPARISON:  CT angiography chest from 01/26/2015. FINDINGS: Cardiovascular: No evidence of embolism to the proximal subsegmental pulmonary artery level. Normal cardiac size. No pericardial effusion. No aortic aneurysm. There are coronary artery calcifications, in keeping with coronary artery disease. There are also minimal peripheral atherosclerotic vascular calcifications of thoracic aorta and its major branches. Mediastinum/Nodes:  Visualized thyroid gland appears grossly unremarkable. No solid / cystic mediastinal masses. The esophagus is nondistended precluding optimal assessment. No axillary, mediastinal or hilar lymphadenopathy by size criteria. Lungs/Pleura: The central tracheo-bronchial tree is patent. There are patchy areas of linear, plate-like atelectasis and/or scarring throughout bilateral lungs. No mass or consolidation. No pleural effusion or pneumothorax. No suspicious lung nodules. Upper Abdomen: There is reflux of intravenous contrast in the central intrahepatic veins, which can be seen with right heart strain. Visualized upper abdominal viscera within normal limits. Musculoskeletal: The visualized soft tissues of the chest wall are grossly unremarkable. No suspicious osseous lesions. There are mild multilevel degenerative changes in the visualized spine. Review of the MIP images confirms the above findings. IMPRESSION: 1. No embolism to the proximal subsegmental pulmonary artery level. 2. No lung mass, consolidation, pleural effusion or pneumothorax. 3. Multiple other nonacute observations, as described above. 4. Aortic atherosclerosis. Aortic Atherosclerosis (ICD10-I70.0). Electronically Signed   By: Jules Schick M.D.   On: 03/20/2024 09:36   DG Chest Portable 1 View Result Date: 03/20/2024 CLINICAL DATA:  Chest pain, tachycardia. EXAM: PORTABLE CHEST 1 VIEW COMPARISON:  January 26, 2015. FINDINGS: The heart size and mediastinal contours are within normal limits. Both lungs are clear. The visualized skeletal structures are unremarkable. IMPRESSION: No active disease. Electronically Signed   By: Lupita Raider M.D.   On: 03/20/2024 08:18    Procedures Procedures    Medications Ordered in ED Medications  sodium chloride 0.9 % bolus 500 mL (0 mLs Intravenous Stopped 03/20/24 0826)  metoprolol tartrate (LOPRESSOR) tablet 25 mg (25 mg Oral Given 03/20/24 0752)  iohexol (OMNIPAQUE) 350 MG/ML injection 50 mL (50 mLs  Intravenous Contrast Given 03/20/24 0911)  diltiazem (CARDIZEM) injection 15 mg (15 mg Intravenous Given 03/20/24 0955)    ED Course/ Medical Decision Making/ A&P Clinical Course as of 03/20/24 1233  Fri Mar 20, 2024  0702 Echo 01/16/24 per chart review: IMPRESSIONS     1. Left ventricular ejection fraction, by estimation, is 60 to 65%. The left ventricle has normal function. The left ventricle has no regional wall motion abnormalities. Left ventricular diastolic parameters are consistent with Grade II diastolic  dysfunction (pseudonormalization). Elevated left atrial pressure.   2. Right ventricular systolic function  is normal. The right ventricular size is normal. Tricuspid regurgitation signal is inadequate for assessing PA pressure.   3. Left atrial size was mildly dilated.   4. The mitral valve is normal in structure. Trivial mitral valve regurgitation.   5. The aortic valve was not well visualized. Aortic valve regurgitation is not visualized. No aortic stenosis is present.   6. The inferior vena cava is normal in size with greater than 50% respiratory variability, suggesting right atrial pressure of 3 mmHg.   [TY]  0813 Troponin I (High Sensitivity): 9 Acs less likely [TY]  0814 D-Dimer, Quant(!): 2.77 Will get CTA [TY]  0814 Basic metabolic panel(!) No significant metabolic derangements.  Appears to be at her baseline renal function. [TY]  [id number] DG Chest Portable 1 View On my independent interpretation.  Does not appear to have overt pneumonia or pneumothorax.  No widened mediastinum to suggest dissection [TY]  0928 CT Angio Chest PE W and/or Wo Contrast I do not appreciate large saddle PE or segmental PEs on my independent interpretation. [TY]  1026 Appears to be sinus rhythm on the monitor at a rate in the 70s to low 80s after diltiazem. [TY]  1034 CT Angio Chest PE W and/or Wo Contrast IMPRESSION: 1. No embolism to the proximal subsegmental pulmonary artery level. 2. No lung  mass, consolidation, pleural effusion or pneumothorax. 3. Multiple other nonacute observations, as described above. 4. Aortic atherosclerosis.  Aortic Atherosclerosis (ICD10-I70.0).   [TY]  1229 Workup largely reassuring.  I suspect patient's symptoms secondary to SVT versus A-fib/flutter. Initial troponin negative.  Mild elevation subsequently which are likely secondary to demand ischemia.  Elevated ddimer but negative CTA.  No significant metabolic derangements.  No pneumonia.  Heart rate in sinus rhythm. Will defer anticoagulation to cardiology as it is difficult to differentiate on her ecg if she was atrialflutter vs SVT. The patient is appropriate for discharge to follow-up with cardiologist outpatient.  Return precautions given. [TY]    Clinical Course User Index [TY] Coral Spikes, DO                                 Medical Decision Making This is a 74 year old female presenting to the emergency department for chest pain.  She is afebrile, she is tachycardic with heart rate in the 150s, hemodynamically stable, oxygen saturation borderline at 90 to 91% while on my interview.  Does not appear to be in respiratory distress.  Per chart review does have a history of SVT.  Recent echo with some minor diastolic dysfunction, but normal EF.  EKG has a lot of baseline artifact difficult to tell if patient is atrial fibrillation/flutter versus sinus tach.  Does not however appear to have ST segment changes that indicate ischemia.  Will repeat EKG.  Will get screening labs, chest x-ray.  Cannot exclude DVT/PE given her borderline oxygen saturations and elevated heart rate although she is low risk for PE/DVT.  Will get D-dimer.  Will give home dose of metoprolol and IV fluids. See ED course for further MDM/disposition.   Amount and/or Complexity of Data Reviewed Independent Historian:     Details: Husband notes recent echo External Data Reviewed:     Details: See ED course Labs: ordered.  Decision-making details documented in ED Course. Radiology: ordered. Decision-making details documented in ED Course. ECG/medicine tests: ordered. Decision-making details documented in ED Course.  Risk Prescription drug management. Decision regarding hospitalization.  Final Clinical Impression(s) / ED Diagnoses Final diagnoses:  SVT (supraventricular tachycardia) [facility])    Rx / DC Orders ED Discharge Orders     None         Coral Spikes, DO 03/20/24 1233

## 2024-03-20 NOTE — ED Triage Notes (Signed)
 Patient reports central chest pain with SOB and nausea this morning , HR=154/min at arrival .

## 2024-03-27 ENCOUNTER — Encounter: Payer: Self-pay | Admitting: Cardiology

## 2024-03-27 ENCOUNTER — Ambulatory Visit: Attending: Cardiology | Admitting: Cardiology

## 2024-03-27 VITALS — BP 164/92 | HR 70 | Resp 16 | Ht 62.0 in | Wt 229.3 lb

## 2024-03-27 DIAGNOSIS — I471 Supraventricular tachycardia, unspecified: Secondary | ICD-10-CM | POA: Diagnosis not present

## 2024-03-27 DIAGNOSIS — E7800 Pure hypercholesterolemia, unspecified: Secondary | ICD-10-CM | POA: Diagnosis not present

## 2024-03-27 DIAGNOSIS — I10 Essential (primary) hypertension: Secondary | ICD-10-CM

## 2024-03-27 MED ORDER — LISINOPRIL 20 MG PO TABS: 20.0000 mg | ORAL_TABLET | Freq: Every day | ORAL | Status: DC

## 2024-03-27 MED ORDER — DILTIAZEM HCL 60 MG PO TABS
60.0000 mg | ORAL_TABLET | Freq: Two times a day (BID) | ORAL | 0 refills | Status: AC | PRN
Start: 2024-03-27 — End: ?

## 2024-03-27 NOTE — Patient Instructions (Addendum)
 Valsalva strain: I have explained to the patient the mechanism of PSVT, explained Valsalva maneuver, straining, splashing cold water on the face.    Also discussed if the usual maneuvers do not improve the symptoms to go to  the nearest medical facility or activate EMS.  .   Medication Instructions:  Your physician has recommended you make the following change in your medication: Take diltiazem 60 mg by mouth twice daily as needed  *If you need a refill on your cardiac medications before your next appointment, please call your pharmacy*  Lab Work: none If you have labs (blood work) drawn today and your tests are completely normal, you will receive your results only by: MyChart Message (if you have MyChart) OR A paper copy in the mail If you have any lab test that is abnormal or we need to change your treatment, we will call you to review the results.  Testing/Procedures: none  Follow-Up: At [facility], you and your health needs are our priority.  As part of our continuing mission to provide you with exceptional heart care, our providers are all part of one team.  This team includes your primary Cardiologist (physician) and Advanced Practice Providers or APPs (Physician Assistants and Nurse Practitioners) who all work together to provide you with the care you need, when you need it.  Your next appointment:   9/26 at 10:20  Provider:   Yates Decamp, MD     We recommend signing up for the patient portal called "MyChart".  Sign up information is provided on this After Visit Summary.  MyChart is used to connect with patients for Virtual Visits (Telemedicine).  Patients are able to view lab/test results, encounter notes, upcoming appointments, etc.  Non-urgent messages can be sent to your provider as well.   To learn more about what you can do with MyChart, go to [email or web address].   Other Instructions       1st Floor: - Lobby - Registration  - Pharmacy  - Lab -  Cafe  2nd Floor: - PV Lab - Diagnostic Testing (echo, CT, nuclear med)  3rd Floor: - Vacant  4th Floor: - TCTS (cardiothoracic surgery) - AFib Clinic - Structural Heart Clinic - Vascular Surgery  - Vascular Ultrasound  5th Floor: - HeartCare Cardiology (general and EP) - Clinical Pharmacy for coumadin, hypertension, lipid, weight-loss medications, and med management appointments    Valet parking services will be available as well.

## 2024-03-27 NOTE — Progress Notes (Signed)
 Cardiology Office Note:  .   Date:  03/27/2024  ID:  Meredith Hayden, DOB 02-26-1950, MRN [id number] PCP: Etta Grandchild, MD  [facility] Providers Cardiologist:  Yates Decamp, MD   History of Present Illness: .   Meredith Hayden is a 74 y.o. Caucasian female patient with history of "SVT" noted om 01/25/2015, on low-dose beta-blocker therapy. Past medical history also includes hyperlipidemia, well-controlled diabetes mellitus.    She is now referred back to [place] for evaluation of chest pain and SVT presented to the emergency room on 03/20/2024 with chest pain, dyspnea, nausea with elevated heart rate of 154 bpm on arrival.  She had mildly elevated serum troponin of 9 followed by 38.  D-dimer was positive at 2.77.  CT angiogram of the chest was negative for PE, mild coronary calcification and aortic atherosclerosis noted.  Received IV diltiazem 15 mg and 1 dose of oral metoprolol 25 mg, patient converted to sinus rhythm and hence discharged home with recommendation for outpatient follow-up.  Discussed the use of AI scribe software for clinical note transcription with the patient, who gave verbal consent to proceed.  History of Present Illness The patient, with a past medical history of PSVT, hypertension, and diastolic heart failure, presents for a follow-up visit after a recent episode of PSVT. The patient reports that the recent episode was similar to an episode she experienced in 2016. The episode started around 2 AM while the patient was awake in bed. The patient felt nauseated and experienced a heavy feeling in her chest. The episode did not resolve on its own, and the patient sought medical attention. The patient was treated with IV diltiazem and oral metoprolol, which successfully converted her back to sinus rhythm. The patient also reports that she has been monitoring her blood pressure at home, which has been fluctuating. The patient has a family history of heart disease, with  her sister having a heart attack at age 83 and her father dying of a heart attack at age 45. The patient is currently on metoprolol succinate 25mg  and is about to start a weight loss medication, Mounjaro.  Labs   Lab Results  Component Value Date   CHOL 150 08/27/2023   HDL 50 08/27/2023   LDLCALC 74 08/27/2023   LDLDIRECT 70.0 07/30/2022   TRIG 156 08/27/2023   CHOLHDL 3 07/30/2022   Lab Results  Component Value Date   NA 139 03/20/2024   K 4.3 03/20/2024   CO2 26 03/20/2024   GLUCOSE 164 (H) 03/20/2024   BUN 20 03/20/2024   CREATININE 1.54 (H) 03/20/2024   CALCIUM 9.0 03/20/2024   GFR 23.18 (L) 12/30/2023   GFRNONAA 36 (L) 03/20/2024      Latest Ref Rng & Units 03/20/2024    6:52 AM 12/30/2023    4:18 PM 09/24/2023    4:19 PM  BMP  Glucose 70 - 99 mg/dL 409  811  84   BUN 8 - 23 mg/dL 20  34  17   Creatinine 0.44 - 1.00 mg/dL 9.14  7.82  9.56   Sodium 135 - 145 mmol/L 139  138  139   Potassium 3.5 - 5.1 mmol/L 4.3  5.2 No hemolysis seen  3.9   Chloride 98 - 111 mmol/L 101  102  104   CO2 22 - 32 mmol/L 26  27  28    Calcium 8.9 - 10.3 mg/dL 9.0  9.5  8.6       Latest Ref Rng &  Units 03/20/2024    6:52 AM 12/30/2023    4:18 PM 09/24/2023    4:19 PM  CBC  WBC 4.0 - 10.5 K/uL 13.7  9.6  9.3   Hemoglobin 12.0 - 15.0 g/dL 13.0  86.5  78.4   Hematocrit 36.0 - 46.0 % 43.2  40.9  38.5   Platelets 150 - 400 K/uL 230  178.0  205.0    Lab Results  Component Value Date   HGBA1C 6.8 (H) 12/30/2023    Lab Results  Component Value Date   TSH 0.25 (L) 12/30/2023    Review of Systems  Cardiovascular:  Negative for chest pain, dyspnea on exertion and leg swelling.  Physical Exam:   VS:  BP (!) 164/92 (BP Location: Right Arm, Patient Position: Sitting, Cuff Size: Large)   Pulse 70   Resp 16   Ht 5\' 2"  (1.575 m)   Wt 229 lb 4.8 oz (104 kg)   LMP 12/31/1993   SpO2 96%   BMI 41.94 kg/m    Wt Readings from Last 3 Encounters:  03/27/24 229 lb 4.8 oz (104 kg)  01/14/24  233 lb (105.7 kg)  12/30/23 231 lb 12.8 oz (105.1 kg)    Physical Exam Constitutional:      Appearance: She is obese.  Neck:     Vascular: No carotid bruit or JVD.  Cardiovascular:     Rate and Rhythm: Normal rate and regular rhythm.     Pulses: Intact distal pulses.     Heart sounds: Normal heart sounds. No murmur heard.    No gallop.  Pulmonary:     Effort: Pulmonary effort is normal.     Breath sounds: Normal breath sounds.  Abdominal:     General: Bowel sounds are normal.     Palpations: Abdomen is soft.  Musculoskeletal:     Right lower leg: No edema.     Left lower leg: No edema.  Studies Reviewed: .    CT angiogram chest excluded pulmonary embolism, there is mild coronary calcification and aortic atherosclerosis.    MYOCARDIAL PERFUSION IMAGING 08/30/2021   Narrative   No ST deviation was noted was noted from baseline T wave changes.   LV perfusion is normal. There is no evidence of ischemia. There is no evidence of infarction.   Nuclear stress EF: 64 %. The left ventricular ejection fraction is normal (55-65%). Left ventricular function is normal. End diastolic cavity size is normal. End systolic cavity size is normal.   The study is normal. The study is low risk.  ECHOCARDIOGRAM COMPLETE 01/16/2024  1. Left ventricular ejection fraction, by estimation, is 60 to 65%. The left ventricle has normal function. The left ventricle has no regional wall motion abnormalities. Left ventricular diastolic parameters are consistent with Grade II diastolic dysfunction (pseudonormalization). Elevated left atrial pressure. 2. Right ventricular systolic function is normal. The right ventricular size is normal. Tricuspid regurgitation signal is inadequate for assessing PA pressure. 3. Left atrial size was mildly dilated. 4. The mitral valve is normal in structure. Trivial mitral valve regurgitation. 5. The aortic valve was not well visualized. Aortic valve regurgitation is not visualized.  No aortic stenosis is present.  EKG:    EKG Interpretation Date/Time:  Friday March 27 2024 10:51:02 EDT Ventricular Rate:  70 PR Interval:  200 QRS Duration:  100 QT Interval:  424 QTC Calculation: 457 R Axis:   -77  Text Interpretation: EKG 03/27/2024: Normal sinus rhythm at rate of 70 bpm, left axis deviation,  left anterior fascicular block.  Incomplete right bundle branch block.  T wave inversion in V1-V3, normal variant.  Poor R progression, cannot exclude anterolateral infarct old.  Compared to 03/21/2023, no significant change. Confirmed by Delrae Rend [id number]) on 03/27/2024 11:00:21 AM    EKG 03/20/2024 at 10 AM: Normal sinus rhythm at rate of 82 bpm, left anterior fascicular block.  Incomplete right bundle branch block.  Nonspecific T abnormality anterior leads.  No change from 09/17/2023.  EKG 03/20/2024 at 7:55 AM: SVT at the rate of 152 bpm, incomplete RBBB, diffuse rate related inferolateral ST segment depression.    Medications and allergies    Allergies  Allergen Reactions   Jardiance [Empagliflozin] Other (See Comments)    Vaginal yeast infections   Oxycodone Hcl Anaphylaxis    REACTION: unspecified   Oxycodone-Acetaminophen Anaphylaxis    Patient states that she can regular tylenol without any problems   Sulfamethoxazole     REACTION: rash   Trulicity [Dulaglutide] Other (See Comments)    Upper GI pain   Benzodiazepines    Codeine Phosphate Itching    REACTION: unspecified   Erythromycin Other (See Comments)    Dizzy and sweaty, vomiting, diarrhea    Metformin And Related Diarrhea   Morphine Sulfate Itching    Tolerated hydromorphone    Tylenol [Acetaminophen] Other (See Comments)    Fatty liver     Current Outpatient Medications:    cetirizine (ZYRTEC) 10 MG tablet, Take 10 mg by mouth daily. , Disp: , Rfl:    Cholecalciferol (VITAMIN D3) 125 MCG (5000 UT) TBDP, Take 5,000 Units by mouth 3 (three) times a week., Disp: , Rfl:    clobetasol ointment  (TEMOVATE) 0.05 %, Apply 1 Application topically daily., Disp: , Rfl:    Continuous Blood Gluc Receiver (FREESTYLE LIBRE 2 READER) DEVI, 1 Act by Does not apply route daily., Disp: 2 each, Rfl: 5   Continuous Blood Gluc Sensor (FREESTYLE LIBRE 2 SENSOR) MISC, 1 Act by Does not apply route daily., Disp: 2 each, Rfl: 5   dicyclomine (BENTYL) 10 MG capsule, TAKE 1 CAPSULE BY MOUTH 3 TIMES  DAILY BEFORE MEALS (Patient taking differently: Take 10 mg by mouth 3 (three) times daily as needed for spasms.), Disp: 270 capsule, Rfl: 0   diltiazem (CARDIZEM) 60 MG tablet, Take 1 tablet (60 mg total) by mouth 2 (two) times daily as needed for up to 1 dose (Take 1 tab as needed for SVT twice daily)., Disp: 30 tablet, Rfl: 0   fluticasone (FLONASE) 50 MCG/ACT nasal spray, Place 1 spray into both nostrils daily., Disp: , Rfl:    gabapentin (NEURONTIN) 600 MG tablet, Take 600 mg by mouth 2 (two) times daily., Disp: , Rfl:    Glucagon (GVOKE HYPOPEN 2-PACK) 1 MG/0.2ML SOAJ, Inject 1 Act into the skin daily as needed., Disp: 2 mL, Rfl: 5   HUMALOG KWIKPEN 100 UNIT/ML KwikPen, INJECT SUBCUTANEOUSLY 16 UNITS 3 TIMES DAILY (Patient taking differently: Inject 8 Units into the skin 3 (three) times daily between meals as needed.), Disp: 60 mL, Rfl: 2   Insulin Glargine (TOUJEO SOLOSTAR ), Inject 20 Units into the skin daily., Disp: , Rfl:    Insulin Pen Needle (UNIFINE PENTIPS PLUS) 33G X 4 MM MISC, Inject 1 Act into the skin 4 (four) times daily., Disp: 400 each, Rfl: 1   ipratropium (ATROVENT HFA) 17 MCG/ACT inhaler, USE 2 PUFFS EVERY 4 HOURS  AS NEEDED FOR WHEEZING., Disp: 12.9 g, Rfl: 3   Lancets (  ONETOUCH DELICA PLUS LANCET33G) MISC, USE AS DIRECTED TO CHECK BLOOD SUGAR 4 TIMES DAILY AFTER MEALS AND AT BEDTIME., Disp: 200 each, Rfl: 4   lisinopril (ZESTRIL) 20 MG tablet, Take 1 tablet (20 mg total) by mouth daily., Disp: , Rfl:    loperamide (IMODIUM) 2 MG capsule, Take 2 mg by mouth as needed for diarrhea or loose  stools., Disp: , Rfl:    metoprolol tartrate (LOPRESSOR) 25 MG tablet, TAKE ONE-HALF TABLET BY MOUTH  TWICE DAILY (Patient taking differently: Take 25 mg by mouth 2 (two) times daily.), Disp: 90 tablet, Rfl: 2   nystatin ointment (MYCOSTATIN), Apply 1 Application topically 2 (two) times daily as needed., Disp: , Rfl:    omeprazole (PRILOSEC) 20 MG capsule, Take 20 mg by mouth daily., Disp: , Rfl:    ONETOUCH VERIO test strip, CHECK BLOOD SUGAR 4 TIMES A DAY., Disp: 200 strip, Rfl: 5   rosuvastatin (CRESTOR) 20 MG tablet, TAKE 1 TABLET BY MOUTH DAILY, Disp: 90 tablet, Rfl: 1   Semaglutide,0.25 or 0.5MG /DOS, (OZEMPIC, 0.25 OR 0.5 MG/DOSE,) 2 MG/3ML SOPN, Inject 0.25 mg into the skin once a week., Disp: , Rfl:    Simethicone 125 MG CAPS, Take 1 capsule by mouth daily as needed., Disp: , Rfl:    thiamine (VITAMIN B-1) 50 MG tablet, Take 1 tablet (50 mg total) by mouth daily., Disp: 90 tablet, Rfl: 1   traZODone (DESYREL) 50 MG tablet, Take 50 mg by mouth at bedtime., Disp: , Rfl:    TRINTELLIX 20 MG TABS tablet, Take 20 mg by mouth daily., Disp: , Rfl:    UNITHROID 50 MCG tablet, TAKE 1 TABLET BY MOUTH DAILY  BEFORE BREAKFAST, Disp: 90 tablet, Rfl: 3   zaleplon (SONATA) 10 MG capsule, Take 10 mg by mouth at bedtime as needed for sleep., Disp: , Rfl:    [START ON 03/31/2024] MOUNJARO 2.5 MG/0.5ML Pen, Inject 2.5 mg into the skin once a week. (Patient not taking: Reported on 03/27/2024), Disp: , Rfl:    ASSESSMENT AND PLAN: .      ICD-10-CM   1. PSVT (paroxysmal supraventricular tachycardia) (HCC)  I47.10 EKG 12-Lead    diltiazem (CARDIZEM) 60 MG tablet    2. Essential hypertension  I10 lisinopril (ZESTRIL) 20 MG tablet    3. Hypercholesteremia  E78.00       Meds ordered this encounter  Medications   lisinopril (ZESTRIL) 20 MG tablet    Sig: Take 1 tablet (20 mg total) by mouth daily.   diltiazem (CARDIZEM) 60 MG tablet    Sig: Take 1 tablet (60 mg total) by mouth 2 (two) times daily as needed  for up to 1 dose (Take 1 tab as needed for SVT twice daily).    Dispense:  30 tablet    Refill:  0    There are no discontinued medications.   1. PSVT (paroxysmal supraventricular tachycardia) (HCC) Patient had SVT with rapid ventricular sponsor in 2016 and again presenting to the emergency room on 03/20/2024 with slower SVT, I do not think this is atrial flutter as she was on a beta-blocker.  Irrespective, she remains asymptomatic and if she were to indeed develop recurrence of symptoms, we could consider doing a event monitor to reevaluate her symptomatology.  But for now we will continue with increased dose of metoprolol to tartrate 25 mg twice daily.  I also added diltiazem 60 mg to be taken on a as needed basis and taught her Valsalva maneuver.  2. Essential  hypertension Blood pressure is elevated, she was evaluated by Dr. Sanda Linger recently and lisinopril was discontinued due to dizziness and low blood pressure.  I have reinitiated lisinopril 20 mg daily.  She has a follow-up appointment next week with Dr. Sanda Linger.  3. Hypercholesteremia She has a very strong family still premature artery disease with her dad having had CAD and died at the age of 73 and also sister having had stents in their 71 to 52 years of age, however patient is 74 years of age with a low risk nuclear stress test in 2022 and she is on high intensity statin with well-controlled LDL.  Although her troponins were minimally elevated during SVT episode, no significant ischemic changes after she converted back to sinus rhythm suggesting low risk for having significant CAD.  Hence did not make any changes to her medications and no need for reevaluation for ischemia.  I will see her back in 6 months.  Signed,  Yates Decamp, MD, [facility] 03/27/2024, 11:24 AM [facility] [address] [place], [place] [address] Phone: [phone]. Fax:  [phone]

## 2024-03-30 ENCOUNTER — Encounter: Payer: Self-pay | Admitting: Internal Medicine

## 2024-03-30 ENCOUNTER — Ambulatory Visit (INDEPENDENT_AMBULATORY_CARE_PROVIDER_SITE_OTHER): Payer: Medicare Other | Admitting: Internal Medicine

## 2024-03-30 VITALS — BP 130/72 | HR 70 | Temp 98.5°F | Resp 16 | Ht 62.0 in | Wt 226.0 lb

## 2024-03-30 DIAGNOSIS — N1831 Chronic kidney disease, stage 3a: Secondary | ICD-10-CM | POA: Diagnosis not present

## 2024-03-30 DIAGNOSIS — Z23 Encounter for immunization: Secondary | ICD-10-CM | POA: Insufficient documentation

## 2024-03-30 DIAGNOSIS — K760 Fatty (change of) liver, not elsewhere classified: Secondary | ICD-10-CM

## 2024-03-30 DIAGNOSIS — Z0001 Encounter for general adult medical examination with abnormal findings: Secondary | ICD-10-CM

## 2024-03-30 DIAGNOSIS — Z794 Long term (current) use of insulin: Secondary | ICD-10-CM

## 2024-03-30 DIAGNOSIS — I10 Essential (primary) hypertension: Secondary | ICD-10-CM | POA: Diagnosis not present

## 2024-03-30 DIAGNOSIS — E039 Hypothyroidism, unspecified: Secondary | ICD-10-CM | POA: Diagnosis not present

## 2024-03-30 DIAGNOSIS — E785 Hyperlipidemia, unspecified: Secondary | ICD-10-CM | POA: Diagnosis not present

## 2024-03-30 DIAGNOSIS — E119 Type 2 diabetes mellitus without complications: Secondary | ICD-10-CM

## 2024-03-30 DIAGNOSIS — Z0000 Encounter for general adult medical examination without abnormal findings: Secondary | ICD-10-CM

## 2024-03-30 LAB — CBC WITH DIFFERENTIAL/PLATELET
Basophils Absolute: 0.1 10*3/uL (ref 0.0–0.1)
Basophils Relative: 0.9 % (ref 0.0–3.0)
Eosinophils Absolute: 0.1 10*3/uL (ref 0.0–0.7)
Eosinophils Relative: 1.6 % (ref 0.0–5.0)
HCT: 39.6 % (ref 36.0–46.0)
Hemoglobin: 12.9 g/dL (ref 12.0–15.0)
Lymphocytes Relative: 28.8 % (ref 12.0–46.0)
Lymphs Abs: 2.5 10*3/uL (ref 0.7–4.0)
MCHC: 32.6 g/dL (ref 30.0–36.0)
MCV: 93.4 fl (ref 78.0–100.0)
Monocytes Absolute: 0.7 10*3/uL (ref 0.1–1.0)
Monocytes Relative: 8.1 % (ref 3.0–12.0)
Neutro Abs: 5.4 10*3/uL (ref 1.4–7.7)
Neutrophils Relative %: 60.6 % (ref 43.0–77.0)
Platelets: 221 10*3/uL (ref 150.0–400.0)
RBC: 4.24 Mil/uL (ref 3.87–5.11)
RDW: 13.4 % (ref 11.5–15.5)
WBC: 8.8 10*3/uL (ref 4.0–10.5)

## 2024-03-30 LAB — HEPATIC FUNCTION PANEL
ALT: 14 U/L (ref 0–35)
AST: 19 U/L (ref 0–37)
Albumin: 3.9 g/dL (ref 3.5–5.2)
Alkaline Phosphatase: 70 U/L (ref 39–117)
Bilirubin, Direct: 0.1 mg/dL (ref 0.0–0.3)
Total Bilirubin: 0.7 mg/dL (ref 0.2–1.2)
Total Protein: 6.6 g/dL (ref 6.0–8.3)

## 2024-03-30 LAB — MICROALBUMIN / CREATININE URINE RATIO
Creatinine,U: 140.3 mg/dL
Microalb Creat Ratio: 6.6 mg/g (ref 0.0–30.0)
Microalb, Ur: 0.9 mg/dL (ref 0.0–1.9)

## 2024-03-30 LAB — BASIC METABOLIC PANEL WITH GFR
BUN: 19 mg/dL (ref 6–23)
CO2: 28 meq/L (ref 19–32)
Calcium: 8.9 mg/dL (ref 8.4–10.5)
Chloride: 104 meq/L (ref 96–112)
Creatinine, Ser: 1.38 mg/dL — ABNORMAL HIGH (ref 0.40–1.20)
GFR: 38.51 mL/min — ABNORMAL LOW (ref >60.00)
Glucose, Bld: 137 mg/dL — ABNORMAL HIGH (ref 70–99)
Potassium: 4 meq/L (ref 3.5–5.1)
Sodium: 140 meq/L (ref 135–145)

## 2024-03-30 LAB — PROTIME-INR
INR: 1.2 ratio — ABNORMAL HIGH (ref 0.8–1.0)
Prothrombin Time: 12.2 s (ref 9.6–13.1)

## 2024-03-30 LAB — LIPID PANEL
Cholesterol: 107 mg/dL (ref 0–200)
HDL: 52.2 mg/dL (ref >39.00)
LDL Cholesterol: 36 mg/dL (ref 0–99)
NonHDL: 55.13
Total CHOL/HDL Ratio: 2
Triglycerides: 96 mg/dL (ref 0.0–149.0)
VLDL: 19.2 mg/dL (ref 0.0–40.0)

## 2024-03-30 LAB — TSH: TSH: 2.8 u[IU]/mL (ref 0.35–5.50)

## 2024-03-30 MED ORDER — FREESTYLE LIBRE 3 SENSOR MISC
1.0000 | Freq: Every day | 5 refills | Status: DC
Start: 2024-03-30 — End: 2024-05-01

## 2024-03-30 MED ORDER — SHINGRIX 50 MCG/0.5ML IM SUSR: 0.5000 mL | Freq: Once | INTRAMUSCULAR | 1 refills | Status: AC

## 2024-03-30 MED ORDER — FREESTYLE LIBRE 3 READER DEVI: 1.0000 | Freq: Every day | 3 refills | Status: AC

## 2024-03-30 NOTE — Patient Instructions (Signed)

## 2024-03-30 NOTE — Progress Notes (Unsigned)
 Subjective:  Patient ID: Meredith Hayden, female    DOB: 07/19/1950  Age: 74 y.o. MRN: [id number]  CC: Annual Exam, Hypothyroidism, Diabetes, and Hyperlipidemia   HPI [name] presents for a CPX and f/up ---  Discussed the use of AI scribe software for clinical note transcription with the patient, who gave verbal consent to proceed.  History of Present Illness   Meredith Hayden "Thayer Ohm" is a 74 year old female with SVT who presents with episodes of rapid heart rate and chest discomfort.  She experiences episodes of supraventricular tachycardia (SVT) with heart rates in the 150s to 160s, initially occurring around 2 AM, accompanied by chest discomfort described as 'a bowling ball in my chest.' She sought emergency care when her heart rate did not decrease spontaneously. Since then, she has had brief recurrences lasting a few seconds, with her watch indicating it occurs 2% of the time. She is on metoprolol, increased to 25 mg, and has a prescription for Cardizem for unresolved episodes, though she has not needed it since hospital discharge. No recent chest pain, shortness of breath, dizziness, or lightheadedness.  Her blood sugars are well controlled, although she experienced a hypoglycemic event yesterday, managed by drinking Coca [employer]. She uses a [place] 2 sensor for monitoring, which will soon be upgraded to a newer model.  She reports hair loss and thinning since her thyroid medication was adjusted to 50 micrograms. She is concerned about her thyroid levels and requests a recheck.       Outpatient Medications Prior to Visit  Medication Sig Dispense Refill   cetirizine (ZYRTEC) 10 MG tablet Take 10 mg by mouth daily.      Cholecalciferol (VITAMIN D3) 125 MCG (5000 UT) TBDP Take 5,000 Units by mouth 3 (three) times a week.     clobetasol ointment (TEMOVATE) 0.05 % Apply 1 Application topically daily.     dicyclomine (BENTYL) 10 MG capsule TAKE 1 CAPSULE BY MOUTH 3  TIMES  DAILY BEFORE MEALS (Patient taking differently: Take 10 mg by mouth 3 (three) times daily as needed for spasms.) 270 capsule 0   diltiazem (CARDIZEM) 60 MG tablet Take 1 tablet (60 mg total) by mouth 2 (two) times daily as needed for up to 1 dose (Take 1 tab as needed for SVT twice daily). 30 tablet 0   fluticasone (FLONASE) 50 MCG/ACT nasal spray Place 1 spray into both nostrils daily.     gabapentin (NEURONTIN) 600 MG tablet Take 600 mg by mouth 2 (two) times daily.     Glucagon (GVOKE HYPOPEN 2-PACK) 1 MG/0.2ML SOAJ Inject 1 Act into the skin daily as needed. 2 mL 5   HUMALOG KWIKPEN 100 UNIT/ML KwikPen INJECT SUBCUTANEOUSLY 16 UNITS 3 TIMES DAILY (Patient taking differently: Inject 8 Units into the skin 3 (three) times daily between meals as needed.) 60 mL 2   Insulin Glargine (TOUJEO SOLOSTAR [place]) Inject 20 Units into the skin daily.     Insulin Pen Needle (UNIFINE PENTIPS PLUS) 33G X 4 MM MISC Inject 1 Act into the skin 4 (four) times daily. 400 each 1   ipratropium (ATROVENT HFA) 17 MCG/ACT inhaler USE 2 PUFFS EVERY 4 HOURS  AS NEEDED FOR WHEEZING. 12.9 g 3   Lancets (ONETOUCH DELICA PLUS LANCET33G) MISC USE AS DIRECTED TO CHECK BLOOD SUGAR 4 TIMES DAILY AFTER MEALS AND AT BEDTIME. 200 each 4   lisinopril (ZESTRIL) 20 MG tablet Take 1 tablet (20 mg total) by mouth daily.  loperamide (IMODIUM) 2 MG capsule Take 2 mg by mouth as needed for diarrhea or loose stools.     metoprolol tartrate (LOPRESSOR) 25 MG tablet TAKE ONE-HALF TABLET BY MOUTH  TWICE DAILY (Patient taking differently: Take 25 mg by mouth 2 (two) times daily.) 90 tablet 2   [START ON 03/31/2024] MOUNJARO 2.5 MG/0.5ML Pen Inject 2.5 mg into the skin once a week.     nystatin ointment (MYCOSTATIN) Apply 1 Application topically 2 (two) times daily as needed.     omeprazole (PRILOSEC) 20 MG capsule Take 20 mg by mouth daily.     ONETOUCH VERIO test strip CHECK BLOOD SUGAR 4 TIMES A DAY. 200 strip 5   rosuvastatin (CRESTOR) 20  MG tablet TAKE 1 TABLET BY MOUTH DAILY 90 tablet 1   Semaglutide,0.25 or 0.5MG /DOS, (OZEMPIC, 0.25 OR 0.5 MG/DOSE,) 2 MG/3ML SOPN Inject 0.25 mg into the skin once a week.     Simethicone 125 MG CAPS Take 1 capsule by mouth daily as needed.     thiamine (VITAMIN B-1) 50 MG tablet Take 1 tablet (50 mg total) by mouth daily. 90 tablet 1   traZODone (DESYREL) 50 MG tablet Take 50 mg by mouth at bedtime.     TRINTELLIX 20 MG TABS tablet Take 20 mg by mouth daily.     UNITHROID 50 MCG tablet TAKE 1 TABLET BY MOUTH DAILY  BEFORE BREAKFAST 90 tablet 3   zaleplon (SONATA) 10 MG capsule Take 10 mg by mouth at bedtime as needed for sleep.     Continuous Blood Gluc Receiver (FREESTYLE LIBRE 2 READER) DEVI 1 Act by Does not apply route daily. 2 each 5   Continuous Blood Gluc Sensor (FREESTYLE LIBRE 2 SENSOR) MISC 1 Act by Does not apply route daily. 2 each 5   No facility-administered medications prior to visit.    ROS Review of Systems  Objective:  BP 130/72 (BP Location: Right Arm, Patient Position: Sitting, Cuff Size: Normal)   Pulse 70   Temp 98.5 F (36.9 C) (Oral)   Resp 16   Ht 5\' 2"  (1.575 m)   Wt 226 lb (102.5 kg)   LMP 12/31/1993   SpO2 95%   BMI 41.34 kg/m   BP Readings from Last 3 Encounters:  03/30/24 130/72  03/27/24 (!) 164/92  03/20/24 126/80    Wt Readings from Last 3 Encounters:  03/30/24 226 lb (102.5 kg)  03/27/24 229 lb 4.8 oz (104 kg)  01/14/24 233 lb (105.7 kg)    Physical Exam  Lab Results  Component Value Date   WBC 13.7 (H) 03/20/2024   HGB 13.9 03/20/2024   HCT 43.2 03/20/2024   PLT 230 03/20/2024   GLUCOSE 164 (H) 03/20/2024   CHOL 150 08/27/2023   TRIG 156 08/27/2023   HDL 50 08/27/2023   LDLDIRECT 70.0 07/30/2022   LDLCALC 74 08/27/2023   ALT 11 09/18/2023   AST 21 09/18/2023   NA 139 03/20/2024   K 4.3 03/20/2024   CL 101 03/20/2024   CREATININE 1.54 (H) 03/20/2024   BUN 20 03/20/2024   CO2 26 03/20/2024   TSH 0.25 (L) 12/30/2023    INR 1.1 11/29/2021   HGBA1C 6.8 (H) 12/30/2023   MICROALBUR 3.15 08/27/2023   MICROALBUR 3.15 08/27/2023    CT Angio Chest PE W and/or Wo Contrast Result Date: 03/20/2024 CLINICAL DATA:  Pulmonary embolism (PE) suspected, low to intermediate prob, positive D-dimer. Chest pain. Shortness of breath. EXAM: CT ANGIOGRAPHY CHEST WITH CONTRAST TECHNIQUE:  Multidetector CT imaging of the chest was performed using the standard protocol during bolus administration of intravenous contrast. Multiplanar CT image reconstructions and MIPs were obtained to evaluate the vascular anatomy. RADIATION DOSE REDUCTION: This exam was performed according to the departmental dose-optimization program which includes automated exposure control, adjustment of the mA and/or kV according to patient size and/or use of iterative reconstruction technique. CONTRAST:  50mL OMNIPAQUE IOHEXOL 350 MG/ML SOLN COMPARISON:  CT angiography chest from 01/26/2015. FINDINGS: Cardiovascular: No evidence of embolism to the proximal subsegmental pulmonary artery level. Normal cardiac size. No pericardial effusion. No aortic aneurysm. There are coronary artery calcifications, in keeping with coronary artery disease. There are also minimal peripheral atherosclerotic vascular calcifications of thoracic aorta and its major branches. Mediastinum/Nodes: Visualized thyroid gland appears grossly unremarkable. No solid / cystic mediastinal masses. The esophagus is nondistended precluding optimal assessment. No axillary, mediastinal or hilar lymphadenopathy by size criteria. Lungs/Pleura: The central tracheo-bronchial tree is patent. There are patchy areas of linear, plate-like atelectasis and/or scarring throughout bilateral lungs. No mass or consolidation. No pleural effusion or pneumothorax. No suspicious lung nodules. Upper Abdomen: There is reflux of intravenous contrast in the central intrahepatic veins, which can be seen with right heart strain. Visualized  upper abdominal viscera within normal limits. Musculoskeletal: The visualized soft tissues of the chest wall are grossly unremarkable. No suspicious osseous lesions. There are mild multilevel degenerative changes in the visualized spine. Review of the MIP images confirms the above findings. IMPRESSION: 1. No embolism to the proximal subsegmental pulmonary artery level. 2. No lung mass, consolidation, pleural effusion or pneumothorax. 3. Multiple other nonacute observations, as described above. 4. Aortic atherosclerosis. Aortic Atherosclerosis (ICD10-I70.0). Electronically Signed   By: Jules Schick M.D.   On: 03/20/2024 09:36   DG Chest Portable 1 View Result Date: 03/20/2024 CLINICAL DATA:  Chest pain, tachycardia. EXAM: PORTABLE CHEST 1 VIEW COMPARISON:  January 26, 2015. FINDINGS: The heart size and mediastinal contours are within normal limits. Both lungs are clear. The visualized skeletal structures are unremarkable. IMPRESSION: No active disease. Electronically Signed   By: Lupita Raider M.D.   On: 03/20/2024 08:18    Assessment & Plan:  Stage 3a chronic kidney disease (HCC) -     Microalbumin / creatinine urine ratio; Future -     Urinalysis, Routine w reflex microscopic; Future -     Basic metabolic panel with GFR; Future  Essential hypertension -     Urinalysis, Routine w reflex microscopic; Future -     CBC with Differential/Platelet; Future -     TSH; Future  Acquired hypothyroidism -     CBC with Differential/Platelet; Future -     TSH; Future  Hyperlipidemia LDL goal <130 -     Lipid panel; Future -     Hepatic function panel; Future -     TSH; Future  OBESITY, MORBID  Fatty liver disease, nonalcoholic -     Hepatic function panel; Future -     Protime-INR; Future  Insulin-requiring or dependent type II diabetes mellitus (HCC) -     FreeStyle Libre 3 Sensor; 1 Act by Does not apply route daily. Place 1 sensor on the skin every 14 days. Use to check glucose  continuously  Dispense: 2 each; Refill: 5 -     FreeStyle Libre 3 Reader; 1 Act by Does not apply route daily.  Dispense: 1 each; Refill: 3 -     Hemoglobin A1c; Future -     HM  Diabetes Foot Exam  Need for prophylactic vaccination and inoculation against varicella -     Shingrix; Inject 0.5 mLs into the muscle once for 1 dose.  Dispense: 0.5 mL; Refill: 1     Follow-up: Return in about 6 months (around 09/29/2024).  Sanda Linger, MD

## 2024-03-31 ENCOUNTER — Encounter: Payer: Self-pay | Admitting: Internal Medicine

## 2024-03-31 LAB — URINALYSIS, ROUTINE W REFLEX MICROSCOPIC
Bilirubin Urine: NEGATIVE
Hgb urine dipstick: NEGATIVE
Ketones, ur: NEGATIVE
Nitrite: NEGATIVE
RBC / HPF: NONE SEEN (ref 0–?)
Specific Gravity, Urine: 1.02 (ref 1.000–1.030)
Total Protein, Urine: NEGATIVE
Urine Glucose: NEGATIVE
Urobilinogen, UA: 0.2 (ref 0.0–1.0)
pH: 6 (ref 5.0–8.0)

## 2024-03-31 LAB — HEMOGLOBIN A1C: Hgb A1c MFr Bld: 6.3 % (ref 4.6–6.5)

## 2024-04-01 DIAGNOSIS — Z0001 Encounter for general adult medical examination with abnormal findings: Secondary | ICD-10-CM | POA: Insufficient documentation

## 2024-04-03 ENCOUNTER — Telehealth: Payer: Self-pay | Admitting: *Deleted

## 2024-04-03 NOTE — Progress Notes (Signed)
 Care Guide Pharmacy Note  04/03/2024 Name: Karissa Meenan MRN: [id number] DOB: 1950/02/21  Referred By: Etta Grandchild, MD Reason for referral: Care Coordination (Outreach to schedule referral with pharmacist )   Chinelo Benn is a 74 y.o. year old female who is a primary care patient of Etta Grandchild, MD.  Satira Anis Raphael was referred to the pharmacist for assistance related to: DMII  An unsuccessful telephone outreach was attempted today to contact the patient who was referred to the pharmacy team for assistance with medication management. Additional attempts will be made to contact the patient.  Burman Nieves, CMA [place]  [facility], [facility] Guide Direct Dial: [phone]  Fax: [phone] Website: [email or web address]

## 2024-04-06 ENCOUNTER — Encounter: Payer: Self-pay | Admitting: Internal Medicine

## 2024-04-06 NOTE — Progress Notes (Unsigned)
 Care Guide Pharmacy Note  04/06/2024 Name: Meredith Hayden MRN: [id number] DOB: 1950/06/16  Referred By: Etta Grandchild, MD Reason for referral: Care Coordination (Outreach to schedule referral with pharmacist )   Meredith Hayden is a 74 y.o. year old female who is a primary care patient of Etta Grandchild, MD.  Meredith Hayden was referred to the pharmacist for assistance related to: DMII  A second unsuccessful telephone outreach was attempted today to contact the patient who was referred to the pharmacy team for assistance with medication management. Additional attempts will be made to contact the patient.  Burman Nieves, CMA [place]  [facility], [facility] Guide Direct Dial: [phone]  Fax: [phone] Website: [email or web address]

## 2024-04-07 NOTE — Progress Notes (Signed)
 Care Guide Pharmacy Note  04/07/2024 Name: Meredith Hayden MRN: [id number] DOB: [birth year]  Referred By: Etta Grandchild, MD Reason for referral: Care Coordination (Outreach to schedule referral with pharmacist )   Meredith Hayden is a 74 y.o. year old female who is a primary care patient of Etta Grandchild, MD.  Meredith Hayden was referred to the pharmacist for assistance related to: DMII  Successful contact was made with the patient to discuss pharmacy services including being ready for the pharmacist to call at least 5 minutes before the scheduled appointment time and to have medication bottles and any blood pressure readings ready for review. The patient agreed to meet with the pharmacist via telephone visit on 04/30/2024  Burman Nieves, CMA [place]  [facility], [facility] Guide Direct Dial: [phone]  Fax: [phone] Website: [email or web address]

## 2024-04-07 NOTE — Progress Notes (Signed)
 Care Guide Pharmacy Note  04/07/2024 Name: Meredith Hayden MRN: [id number] DOB: 06-12-1950  Referred By: Etta Grandchild, MD Reason for referral: Care Coordination (Outreach to schedule referral with pharmacist )   Meredith Hayden is a 74 y.o. year old female who is a primary care patient of Etta Grandchild, MD.  Meredith Hayden was referred to the pharmacist for assistance related to: DMII  A third unsuccessful telephone outreach was attempted today to contact the patient who was referred to the pharmacy team for assistance with medication management. The Population Health team is pleased to engage with this patient at any time in the future upon receipt of referral and should he/she be interested in assistance from the Population Health team.  Meredith Hayden, CMA [facility]  [facility], [facility] Guide Direct Dial: [phone]  Fax: [phone] Website: [email or web address]

## 2024-04-08 ENCOUNTER — Other Ambulatory Visit: Payer: Self-pay | Admitting: Internal Medicine

## 2024-04-08 NOTE — Telephone Encounter (Signed)
 This has been taken care of.

## 2024-04-30 ENCOUNTER — Other Ambulatory Visit (INDEPENDENT_AMBULATORY_CARE_PROVIDER_SITE_OTHER): Admitting: Pharmacist

## 2024-04-30 DIAGNOSIS — E119 Type 2 diabetes mellitus without complications: Secondary | ICD-10-CM

## 2024-04-30 DIAGNOSIS — Z794 Long term (current) use of insulin: Secondary | ICD-10-CM

## 2024-04-30 NOTE — Progress Notes (Signed)
    05/01/2024 Name: Meredith Hayden MRN: [id number] DOB: [birth year]  Chief Complaint  Patient presents with   Diabetes   Medication Management    Meredith Hayden is a 74 y.o. year old female who presented for a telephone visit.   They were referred to the pharmacist by their PCP for assistance in managing diabetes and hypotension/hyperkalemia .    Subjective:  Care Team: Primary Care Provider: Arcadio Knuckles, MD ; Next Scheduled Visit: none scheduled   Medication Access/Adherence  Current Pharmacy:  [facility] - [place], [place] - [address] [place] [address] Phone: [phone] Fax: [phone]  [facility] [place], [place] - [address] [place] [address] Phone: [phone] Fax: [phone]  [facility] - [place], [place] - [address] [place] [address] Phone: [phone] Fax: [phone]   Patient reports affordability concerns with their medications: No  Patient reports access/transportation concerns to their pharmacy: No  Patient reports adherence concerns with their medications:  Yes  Pt notes she needs her [employer] sent to [employer]   Diabetes:  Current medications: Toujeo  10 units daily, Humalog  SSI, Mounjaro 2.5 mg weekly  Current glucose readings: uses Freestyle Libre to monitor BG very closely *Needs new Libre sensors - she received [employer] 3 reader and sensors, wants [place] 3 Plus sensors since the [place] 3 will no longer be made after September  - Notes she feels [place] isn't working as well as Ozempic  Hypertension:  Current medications: metoprolol  tartrate 25 mg 1/2 tablet BID    Patient denies hypotensive s/sx including dizziness, lightheadedness.      Objective:  BP Readings from Last 3 Encounters:  03/30/24 130/72  03/27/24 (!) 164/92  03/20/24 126/80      Lab Results  Component Value Date   HGBA1C 6.3 03/30/2024    Lab Results  Component Value Date   CREATININE 1.38 (H) 03/30/2024   BUN 19 03/30/2024   NA 140 03/30/2024   K 4.0 03/30/2024   CL 104 03/30/2024   CO2 28 03/30/2024    Lab Results  Component Value Date   CHOL 107 03/30/2024   HDL 52.20 03/30/2024   LDLCALC 36 03/30/2024   LDLDIRECT 70.0 07/30/2022   TRIG 96.0 03/30/2024   CHOLHDL 2 03/30/2024    Medications Reviewed Today   Medications were not reviewed in this encounter      Assessment/Plan:   Diabetes: - Currently controlled - Continue current regimen.  - Reviewed that she is on the lowest dose of Mounjaro and it will likely work better with increasing doses. She plans to contact her endocrinologist about increasing to 5 mg. - Will send Libre 3 Plus sensors to [facility]  Hypertension: - Currently controlled - Recommend to continue current regimen    Follow Up Plan: PRN  Rainelle Bur, PharmD, BCPS, CPP Clinical Pharmacist Practitioner [facility] at [facility] [phone]

## 2024-05-01 MED ORDER — FREESTYLE LIBRE 3 PLUS SENSOR MISC: 11 refills | Status: DC

## 2024-05-01 NOTE — Patient Instructions (Signed)
 It was a pleasure speaking with you today!  I have sent the Freestyle Libre 3 Plus sensors to [employer].  Feel free to call with any questions or concerns!  Rainelle Bur, PharmD, BCPS, CPP Clinical Pharmacist Practitioner [facility] at [facility] [phone]

## 2024-05-15 ENCOUNTER — Other Ambulatory Visit: Payer: Self-pay | Admitting: Internal Medicine

## 2024-05-15 DIAGNOSIS — E119 Type 2 diabetes mellitus without complications: Secondary | ICD-10-CM

## 2024-05-15 DIAGNOSIS — E11319 Type 2 diabetes mellitus with unspecified diabetic retinopathy without macular edema: Secondary | ICD-10-CM

## 2024-06-01 ENCOUNTER — Ambulatory Visit

## 2024-06-08 DIAGNOSIS — E1140 Type 2 diabetes mellitus with diabetic neuropathy, unspecified: Secondary | ICD-10-CM | POA: Diagnosis not present

## 2024-06-08 DIAGNOSIS — N184 Chronic kidney disease, stage 4 (severe): Secondary | ICD-10-CM | POA: Diagnosis not present

## 2024-06-08 DIAGNOSIS — Z794 Long term (current) use of insulin: Secondary | ICD-10-CM | POA: Diagnosis not present

## 2024-06-08 DIAGNOSIS — K760 Fatty (change of) liver, not elsewhere classified: Secondary | ICD-10-CM | POA: Diagnosis not present

## 2024-06-18 ENCOUNTER — Ambulatory Visit

## 2024-06-19 ENCOUNTER — Ambulatory Visit

## 2024-06-19 VITALS — Ht 62.0 in | Wt 220.0 lb

## 2024-06-19 DIAGNOSIS — Z780 Asymptomatic menopausal state: Secondary | ICD-10-CM | POA: Diagnosis not present

## 2024-06-19 DIAGNOSIS — Z0000 Encounter for general adult medical examination without abnormal findings: Secondary | ICD-10-CM

## 2024-06-19 NOTE — Patient Instructions (Signed)
 Meredith Hayden , Thank you for taking time out of your busy schedule to complete your Annual Wellness Visit with me. I enjoyed our conversation and look forward to speaking with you again next year. I, as well as your care team,  appreciate your ongoing commitment to your health goals. Please review the following plan we discussed and let me know if I can assist you in the future. Your Game plan/ To Do List    Referrals: If you haven't heard from the office you've been referred to, please reach out to them at the phone provided.  Ordered a DEXA Scan Follow up Visits: Next Medicare AWV with our clinical staff: 06/21/2025   Have you seen your provider in the last 6 months (3 months if uncontrolled diabetes)? No Next Office Visit with your provider: 06/30/2024  Clinician Recommendations:  Aim for 30 minutes of exercise or brisk walking, 6-8 glasses of water, and 5 servings of fruits and vegetables each day.       This is a list of the screening recommended for you and due dates:  Health Maintenance  Topic Date Due   Eye exam for diabetics  05/13/2024   Flu Shot  07/31/2024   Hemoglobin A1C  09/29/2024   Yearly kidney function blood test for diabetes  03/30/2025   Yearly kidney health urinalysis for diabetes  03/30/2025   Complete foot exam   03/30/2025   Medicare Annual Wellness Visit  06/19/2025   Mammogram  01/06/2026   DTaP/Tdap/Td vaccine (3 - Td or Tdap) 05/19/2028   Pneumococcal Vaccine for age over 59  Completed   DEXA scan (bone density measurement)  Completed   Hepatitis C Screening  Completed   Zoster (Shingles) Vaccine  Completed   HPV Vaccine  Aged Out   Meningitis B Vaccine  Aged Out   Colon Cancer Screening  Discontinued   COVID-19 Vaccine  Discontinued    Advanced directives: (Provided) Advance directive discussed with you today. I have provided a copy for you to complete at home and have notarized. Once this is complete, please bring a copy in to our office so we can scan  it into your chart.  Advance Care Planning is important because it:  [x]  Makes sure you receive the medical care that is consistent with your values, goals, and preferences  [x]  It provides guidance to your family and loved ones and reduces their decisional burden about whether or not they are making the right decisions based on your wishes.  Follow the link provided in your after visit summary or read over the paperwork we have mailed to you to help you started getting your Advance Directives in place. If you need assistance in completing these, please reach out to us  so that we can help you!

## 2024-06-19 NOTE — Progress Notes (Signed)
 Subjective:  Please attest and cosign this visit due to patients primary care provider not being in the office at the time the visit was completed.  (Pt of Meredith Hayden)   Meredith Hayden is a 74 y.o. who presents for a Medicare Wellness preventive visit.  As a reminder, Annual Wellness Visits don't include a physical exam, and some assessments may be limited, especially if this visit is performed virtually. We may recommend an in-person follow-up visit with your provider if needed.  Visit Complete: Virtual I connected with  Meredith Hayden on 06/19/24 by a audio enabled telemedicine application and verified that I am speaking with the correct person using two identifiers.  Patient Location: Home  Provider Location: Office/Clinic  I discussed the limitations of evaluation and management by telemedicine. The patient expressed understanding and agreed to proceed.  Vital Signs: Because this visit was a virtual/telehealth visit, some criteria may be missing or patient reported. Any vitals not documented were not able to be obtained and vitals that have been documented are patient reported.  VideoDeclined- This patient declined Librarian, academic. Therefore the visit was completed with audio only.  Persons Participating in Visit: Patient.  AWV Questionnaire: Yes: Patient Medicare AWV questionnaire was completed by the patient on 06/16/2024; I have confirmed that all information answered by patient is correct and no changes since this date.  Cardiac Risk Factors include: advanced age (>32men, >63 women);dyslipidemia;hypertension;diabetes mellitus;obesity (BMI >30kg/m2)     Objective:    Today's Vitals   06/19/24 1508  Weight: 220 lb (99.8 kg)  Height: 5' 2 (1.575 m)   Body mass index is 40.24 kg/m.     06/19/2024    3:06 PM 03/20/2024    6:46 AM 09/18/2023    5:00 AM 09/16/2023    5:49 PM 02/19/2023    3:38 PM 01/18/2022    1:45 PM 01/02/2021     2:05 PM  Advanced Directives  Does Patient Have a Medical Advance Directive? No No No Yes No No No  Type of Risk analyst     Copy of Healthcare Power of Attorney in Chart?   No - copy requested      Would patient like information on creating a medical advance directive? Yes (MAU/Ambulatory/Procedural Areas - Information given)  No - Patient declined  No - Patient declined No - Patient declined No - Patient declined    Current Medications (verified) Outpatient Encounter Medications as of 06/19/2024  Medication Sig   cetirizine (ZYRTEC) 10 MG tablet Take 10 mg by mouth daily.    Cholecalciferol (VITAMIN D3) 125 MCG (5000 UT) TBDP Take 5,000 Units by mouth 3 (three) times a week.   clobetasol  ointment (TEMOVATE ) 0.05 % Apply 1 Application topically daily.   Continuous Glucose Receiver (FREESTYLE LIBRE 3 READER) DEVI 1 Act by Does not apply route daily.   Continuous Glucose Sensor (FREESTYLE LIBRE 3 PLUS SENSOR) MISC Change sensor every 15 days.   dicyclomine  (BENTYL ) 10 MG capsule TAKE 1 CAPSULE BY MOUTH 3 TIMES  DAILY BEFORE MEALS   diltiazem  (CARDIZEM ) 60 MG tablet Take 1 tablet (60 mg total) by mouth 2 (two) times daily as needed for up to 1 dose (Take 1 tab as needed for SVT twice daily).   fluticasone  (FLONASE ) 50 MCG/ACT nasal spray Place 1 spray into both nostrils daily.   gabapentin  (NEURONTIN ) 600 MG tablet Take 600 mg by mouth 2 (two) times  daily.   Glucagon  (GVOKE HYPOPEN  2-PACK) 1 MG/0.2ML SOAJ Inject 1 Act into the skin daily as needed.   HUMALOG  KWIKPEN 100 UNIT/ML KwikPen INJECT SUBCUTANEOUSLY 16 UNITS 3 TIMES DAILY   Insulin  Glargine (TOUJEO  SOLOSTAR [place]) Inject 20 Units into the skin daily.   Insulin  Pen Needle (UNIFINE PENTIPS PLUS) 33G X 4 MM MISC Inject 1 Act into the skin 4 (four) times daily.   ipratropium (ATROVENT  HFA) 17 MCG/ACT inhaler USE 2 PUFFS EVERY 4 HOURS  AS NEEDED FOR WHEEZING.   Lancets  (ONETOUCH DELICA PLUS LANCET33G) MISC USE AS DIRECTED TO CHECK BLOOD SUGAR 4 TIMES DAILY AFTER MEALS AND AT BEDTIME.   lisinopril  (ZESTRIL ) 20 MG tablet Take 1 tablet (20 mg total) by mouth daily.   loperamide (IMODIUM) 2 MG capsule Take 2 mg by mouth as needed for diarrhea or loose stools.   metoprolol  tartrate (LOPRESSOR ) 25 MG tablet TAKE ONE-HALF TABLET BY MOUTH  TWICE DAILY   MOUNJARO 2.5 MG/0.5ML Pen Inject 2.5 mg into the skin once a week.   nystatin  ointment (MYCOSTATIN ) Apply 1 Application topically 2 (two) times daily as needed.   omeprazole (PRILOSEC) 20 MG capsule Take 20 mg by mouth daily.   ONETOUCH VERIO test strip CHECK BLOOD SUGAR 4 TIMES A DAY.   rosuvastatin  (CRESTOR ) 20 MG tablet TAKE 1 TABLET BY MOUTH DAILY   Simethicone  125 MG CAPS Take 1 capsule by mouth daily as needed.   thiamine  (VITAMIN B-1) 50 MG tablet Take 1 tablet (50 mg total) by mouth daily.   traZODone  (DESYREL ) 50 MG tablet Take 50 mg by mouth at bedtime.   TRINTELLIX  20 MG TABS tablet Take 20 mg by mouth daily.   UNITHROID  50 MCG tablet TAKE 1 TABLET BY MOUTH DAILY  BEFORE BREAKFAST   zaleplon (SONATA) 10 MG capsule Take 10 mg by mouth at bedtime as needed for sleep.   No facility-administered encounter medications on file as of 06/19/2024.    Allergies (verified) Jardiance  [empagliflozin ], Oxycodone hcl, Oxycodone-acetaminophen , Sulfamethoxazole, Trulicity  [dulaglutide ], Benzodiazepines, Codeine phosphate, Erythromycin, Metformin  and related, Morphine sulfate, and Tylenol  [acetaminophen ]   History: Past Medical History:  Diagnosis Date   Allergy    Anxiety    Arthritis    Asthma    Blood transfusion without reported diagnosis 2002   with knee replacement   Depression    Diabetes mellitus    Diabetes mellitus type 2 with retinopathy (HCC)    Diverticulitis    Dyspareunia    vaginal dryness   Fibroid    reason for hysterectomy   GERD (gastroesophageal reflux disease)    IBS (irritable bowel  syndrome)    Lichen sclerosus    OSA (obstructive sleep apnea)    Osteopenia 2009   Psoriasis    Sleep apnea    STD (sexually transmitted disease)    Hx HPV   Thyroid  disease    Past Surgical History:  Procedure Laterality Date   ABDOMINAL HYSTERECTOMY  1995   TAH/LSO--Dr. Ola Hayden   CESAREAN SECTION  1978   FOOT SURGERY Bilateral    NASAL SINUS SURGERY     OOPHORECTOMY     PARTIAL COLECTOMY     -RSO   ROTATOR CUFF REPAIR Left    TOTAL KNEE ARTHROPLASTY Bilateral 2002, 2004   Family History  Problem Relation Age of Onset   COPD Mother    Diabetes Mother    Heart attack Father 25   Hypertension Father    Heart attack Sister 64  Diabetes Sister    Hypertension Sister    Diabetes Brother    Hypertension Sister    Diabetes Sister    Hyperlipidemia Other        fhx   Hypertension Other        fhx   Cancer Other        ovarian/grandmother   Ovarian cancer Maternal Aunt    Diabetes Maternal Aunt    Stomach cancer Maternal Grandmother    Liver cancer Maternal Grandmother    Social History   Socioeconomic History   Marital status: Married    Spouse name: Ethelle Herb   Number of children: 2   Years of education: 18   Highest education level: Bachelor's degree (e.g., BA, AB, BS)  Occupational History   Occupation: disabled    Comment: orthopedic / psych  Tobacco Use   Smoking status: Never   Smokeless tobacco: Never  Vaping Use   Vaping status: Never Used  Substance and Sexual Activity   Alcohol use: No    Alcohol/week: 0.0 standard drinks of alcohol    Comment: stopped in 2018, was social    Drug use: No   Sexual activity: Not Currently    Partners: Male    Birth control/protection: Surgical    Comment: TAH  Other Topics Concern   Not on file  Social History Narrative   Grew up in [place]. Currently resides in resides in a house with her husband. 4 dogs. Fun: Play with your dogs and be with grandchildren, needle work.    Denies any religious beliefs effecting  healthcare.    Social Drivers of Corporate investment banker Strain: Low Risk  (06/19/2024)   Overall Financial Resource Strain (CARDIA)    Difficulty of Paying Living Expenses: Not hard at all  Food Insecurity: No Food Insecurity (06/19/2024)   Hunger Vital Sign    Worried About Running Out of Food in the Last Year: Never true    Ran Out of Food in the Last Year: Never true  Transportation Needs: No Transportation Needs (06/19/2024)   PRAPARE - Administrator, Civil Service (Medical): No    Lack of Transportation (Non-Medical): No  Physical Activity: Inactive (06/19/2024)   Exercise Vital Sign    Days of Exercise per Week: 0 days    Minutes of Exercise per Session: 0 min  Stress: Stress Concern Present (06/19/2024)   [employer] of Occupational Health - Occupational Stress Questionnaire    Feeling of Stress: Very much  Social Connections: Moderately Integrated (06/19/2024)   Social Connection and Isolation Panel    Frequency of Communication with Friends and Family: Once a week    Frequency of Social Gatherings with Friends and Family: Once a week    Attends Religious Services: 1 to 4 times per year    Active Member of [employer] or Organizations: Yes    Attends Banker Meetings: 1 to 4 times per year    Marital Status: Married    Tobacco Counseling Counseling given: No    Clinical Intake:  Pre-visit preparation completed: Yes  Pain : No/denies pain     BMI - recorded: 40.24 Nutritional Status: BMI > 30  Obese Nutritional Risks: None Diabetes: Yes CBG done?: Yes CBG resulted in Enter/ Edit results?: Yes (fasting - 106) Did pt. bring in CBG monitor from home?: No  Lab Results  Component Value Date   HGBA1C 6.3 03/30/2024   HGBA1C 6.8 (H) 12/30/2023   HGBA1C 6.5 (H)  09/17/2023     How often do you need to have someone help you when you read instructions, pamphlets, or other written materials from your doctor or pharmacy?: 1 -  Never  Interpreter Needed?: No  Information entered by :: Kandy Orris, CMA   Activities of Daily Living     06/19/2024    3:11 PM 06/16/2024   11:26 PM  In your present state of health, do you have any difficulty performing the following activities:  Hearing? 0 0  Vision? 1 1  Comment appt w/Dr McFarland in 10/2024 - ok to wait per pt   Difficulty concentrating or making decisions? 0 0  Walking or climbing stairs? 1 1  Comment uses a cane   Dressing or bathing? 0 0  Doing errands, shopping? 0 0  Preparing Food and eating ? N N  Using the Toilet? N N  In the past six months, have you accidently leaked urine? Colie Dawes  Comment wears a pad   Do you have problems with loss of bowel control? Colie Dawes  Comment wears a pad   Managing your Medications? N N  Managing your Finances? N N  Housekeeping or managing your Housekeeping? Colie Dawes    Patient Care Team: Arcadio Knuckles, MD as PCP - General (Internal Medicine) Knox Perl, MD as PCP - Cardiology (Cardiology) Greta Leatherwood, MD as Consulting Physician (Obstetrics and Gynecology) Tobin Forts, MD as Consulting Physician (Gastroenterology) Daphine Eagle, MD as Consulting Physician (Psychiatry) Dale Dubonnet, OD as Referring Physician (Optometry)  I have updated your Care Teams any recent Medical Services you may have received from other providers in the past year.     Assessment:   This is a routine wellness examination for Rhylei.  Hearing/Vision screen Hearing Screening - Comments:: Denies hearing difficulties   Vision Screening - Comments:: Wears rx glasses - appt w/Dr McFarland in 10/2024   Goals Addressed               This Visit's Progress     Patient Stated (pt-stated)        Patient stated she's wanting to lose weight - 20lbs more       Depression Screen     06/19/2024    3:14 PM 03/30/2024    3:01 PM 02/19/2023    3:40 PM 01/18/2022    1:47 PM 08/21/2021    3:25 PM 01/02/2021    2:25 PM 05/23/2020     3:28 PM  PHQ 2/9 Scores  PHQ - 2 Score 0 3 2 0 1 0 2  PHQ- 9 Score 3 7 7    9     Fall Risk     06/19/2024    3:13 PM 06/16/2024   11:26 PM 03/30/2024    3:01 PM 02/19/2023    3:55 PM 02/18/2023    5:41 PM  Fall Risk   Falls in the past year? 0 0 1 1 1   Number falls in past yr: 0 0 0 1 1  Injury with Fall? 0 0 0 0 0  Risk for fall due to : No Fall Risks;Impaired balance/gait  History of fall(s) Impaired balance/gait   Risk for fall due to: Comment uses a cane      Follow up Falls evaluation completed;Falls prevention discussed  Falls evaluation completed Falls evaluation completed     MEDICARE RISK AT HOME:  Medicare Risk at Home Any stairs in or around the home?: Yes If so, are there  any without handrails?: Yes Home free of loose throw rugs in walkways, pet beds, electrical cords, etc?: Yes Adequate lighting in your home to reduce risk of falls?: Yes Life alert?: No Use of a cane, walker or w/c?: Yes (cane) Grab bars in the bathroom?: Yes Shower chair or bench in shower?: Yes Elevated toilet seat or a handicapped toilet?: No  TIMED UP AND GO:  Was the test performed?  No  Cognitive Function: 6CIT completed    01/01/2018   10:57 AM  MMSE - Mini Mental State Exam  Not completed: Refused        06/19/2024    3:18 PM 02/19/2023    3:57 PM  6CIT Screen  What Year? 0 points 0 points  What month? 0 points 0 points  What time? 0 points 0 points  Count back from 20 0 points 0 points  Months in reverse 0 points 0 points  Repeat phrase 0 points 0 points  Total Score 0 points 0 points    Immunizations Immunization History  Administered Date(s) Administered   Fluad Quad(high Dose 65+) 10/21/2019, 10/13/2020, 12/13/2021, 01/31/2023   Fluad Trivalent(High Dose 65+) 09/20/2023   Hepatitis A, Adult 03/01/2015   Influenza Split 09/19/2011   Influenza Whole 12/08/2007, 10/07/2009, 10/13/2010   Influenza, High Dose Seasonal PF 10/12/2016, 09/05/2017, 11/25/2018    Influenza,inj,Quad PF,6+ Mos 12/09/2013, 09/30/2015   Influenza-Unspecified 11/26/2014   Pneumococcal Conjugate-13 01/01/2018   Pneumococcal Polysaccharide-23 09/19/2011, 01/07/2019   Td 09/19/2007   Tdap 05/19/2018   Zoster Recombinant(Shingrix ) 04/05/2022, 04/01/2024   Zoster, Live 07/07/2013    Screening Tests Health Maintenance  Topic Date Due   OPHTHALMOLOGY EXAM  05/13/2024   INFLUENZA VACCINE  07/31/2024   HEMOGLOBIN A1C  09/29/2024   Diabetic kidney evaluation - eGFR measurement  03/30/2025   Diabetic kidney evaluation - Urine ACR  03/30/2025   FOOT EXAM  03/30/2025   Medicare Annual Wellness (AWV)  06/19/2025   MAMMOGRAM  01/06/2026   DTaP/Tdap/Td (3 - Td or Tdap) 05/19/2028   Pneumococcal Vaccine: 50+ Years  Completed   DEXA SCAN  Completed   Hepatitis C Screening  Completed   Zoster Vaccines- Shingrix   Completed   HPV VACCINES  Aged Out   Meningococcal B Vaccine  Aged Out   Colonoscopy  Discontinued   COVID-19 Vaccine  Discontinued    Health Maintenance  Health Maintenance Due  Topic Date Due   OPHTHALMOLOGY EXAM  05/13/2024   Health Maintenance Items Addressed:  DEXA ordered today - last 2016.  Patient requested a test.  Additional Screening:  Vision Screening: Recommended annual ophthalmology exams for early detection of glaucoma and other disorders of the eye. Would you like a referral to an eye doctor? No    Dental Screening: Recommended annual dental exams for proper oral hygiene  Community Resource Referral / Chronic Care Management: CRR required this visit?  No   CCM required this visit?  No   Plan:    I have personally reviewed and noted the following in the patient's chart:   Medical and social history Use of alcohol, tobacco or illicit drugs  Current medications and supplements including opioid prescriptions. Patient is not currently taking opioid prescriptions. Functional ability and status Nutritional status Physical  activity Advanced directives List of other physicians Hospitalizations, surgeries, and ER visits in previous 12 months Vitals Screenings to include cognitive, depression, and falls Referrals and appointments  In addition, I have reviewed and discussed with patient certain preventive protocols, quality metrics, and  best practice recommendations. A written personalized care plan for preventive services as well as general preventive health recommendations were provided to patient.   Patria Bookbinder, CMA   06/19/2024   After Visit Summary: (MyChart) Due to this being a telephonic visit, the after visit summary with patients personalized plan was offered to patient via MyChart   Notes: Nothing significant to report at this time.

## 2024-06-30 ENCOUNTER — Ambulatory Visit: Admitting: Internal Medicine

## 2024-06-30 ENCOUNTER — Encounter: Payer: Self-pay | Admitting: Internal Medicine

## 2024-06-30 ENCOUNTER — Ambulatory Visit: Payer: Self-pay | Admitting: Internal Medicine

## 2024-06-30 VITALS — BP 100/68 | HR 76 | Temp 98.6°F | Resp 16 | Ht 62.0 in | Wt 216.4 lb

## 2024-06-30 DIAGNOSIS — I950 Idiopathic hypotension: Secondary | ICD-10-CM

## 2024-06-30 DIAGNOSIS — R9431 Abnormal electrocardiogram [ECG] [EKG]: Secondary | ICD-10-CM

## 2024-06-30 DIAGNOSIS — A419 Sepsis, unspecified organism: Secondary | ICD-10-CM | POA: Insufficient documentation

## 2024-06-30 DIAGNOSIS — E119 Type 2 diabetes mellitus without complications: Secondary | ICD-10-CM

## 2024-06-30 DIAGNOSIS — Z794 Long term (current) use of insulin: Secondary | ICD-10-CM

## 2024-06-30 DIAGNOSIS — N1831 Chronic kidney disease, stage 3a: Secondary | ICD-10-CM | POA: Diagnosis not present

## 2024-06-30 DIAGNOSIS — N3000 Acute cystitis without hematuria: Secondary | ICD-10-CM | POA: Diagnosis not present

## 2024-06-30 DIAGNOSIS — E039 Hypothyroidism, unspecified: Secondary | ICD-10-CM | POA: Diagnosis not present

## 2024-06-30 LAB — URINALYSIS, ROUTINE W REFLEX MICROSCOPIC
Bilirubin Urine: NEGATIVE
Hgb urine dipstick: NEGATIVE
Ketones, ur: NEGATIVE
Nitrite: POSITIVE — AB
Specific Gravity, Urine: 1.01 (ref 1.000–1.030)
Urine Glucose: NEGATIVE
Urobilinogen, UA: 0.2 (ref 0.0–1.0)
pH: 6 (ref 5.0–8.0)

## 2024-06-30 LAB — CBC WITH DIFFERENTIAL/PLATELET
Basophils Absolute: 0.1 10*3/uL (ref 0.0–0.1)
Basophils Relative: 1 % (ref 0.0–3.0)
Eosinophils Absolute: 0.1 10*3/uL (ref 0.0–0.7)
Eosinophils Relative: 1.5 % (ref 0.0–5.0)
HCT: 41.4 % (ref 36.0–46.0)
Hemoglobin: 13.7 g/dL (ref 12.0–15.0)
Lymphocytes Relative: 28.3 % (ref 12.0–46.0)
Lymphs Abs: 2.5 10*3/uL (ref 0.7–4.0)
MCHC: 33 g/dL (ref 30.0–36.0)
MCV: 90.8 fl (ref 78.0–100.0)
Monocytes Absolute: 0.7 10*3/uL (ref 0.1–1.0)
Monocytes Relative: 7.7 % (ref 3.0–12.0)
Neutro Abs: 5.4 10*3/uL (ref 1.4–7.7)
Neutrophils Relative %: 61.5 % (ref 43.0–77.0)
Platelets: 200 10*3/uL (ref 150.0–400.0)
RBC: 4.56 Mil/uL (ref 3.87–5.11)
RDW: 14.1 % (ref 11.5–15.5)
WBC: 8.7 10*3/uL (ref 4.0–10.5)

## 2024-06-30 LAB — TSH: TSH: 2.63 u[IU]/mL (ref 0.35–5.50)

## 2024-06-30 LAB — CORTISOL: Cortisol, Plasma: 12.9 ug/dL

## 2024-06-30 LAB — D-DIMER, QUANTITATIVE: D-Dimer, Quant: 0.75 ug{FEU}/mL — ABNORMAL HIGH (ref <0.50)

## 2024-06-30 LAB — HEMOGLOBIN A1C: Hgb A1c MFr Bld: 6.7 % — ABNORMAL HIGH (ref 4.6–6.5)

## 2024-06-30 LAB — TROPONIN I (HIGH SENSITIVITY): High Sens Troponin I: 3 ng/L (ref 2–17)

## 2024-06-30 LAB — BRAIN NATRIURETIC PEPTIDE: Pro B Natriuretic peptide (BNP): 10 pg/mL (ref 0.0–100.0)

## 2024-06-30 NOTE — Patient Instructions (Signed)

## 2024-06-30 NOTE — Progress Notes (Unsigned)
 Subjective:  Patient ID: Meredith Hayden, female    DOB: June 20, 1950  Age: 74 y.o. MRN: [id number]  CC: Medical Management of Chronic Issues, Dizziness (Patient states that she thinks she has orthostatic hypertension ), Hypertension, and Diabetes   HPI Meredith Hayden presents for f/up  ---  Discussed the use of AI scribe software for clinical note transcription with the patient, who gave verbal consent to proceed.  History of Present Illness   Meredith Hayden is a 74 year old female who presents with dizziness and difficulty functioning. She is accompanied by her husband.  She has been experiencing dizziness and difficulty functioning, particularly noting an episode when walking from the parking lot to the clinic. These symptoms have been occurring intermittently over the past few days. No associated symptoms such as nausea, vomiting, coughing, fevers, or chills.  Her blood sugars have been well-controlled since increasing her dose of Mounjaro four days ago, with an average glucose level of 95 and only requiring insulin  once in the past five days.       Outpatient Medications Prior to Visit  Medication Sig Dispense Refill   cetirizine (ZYRTEC) 10 MG tablet Take 10 mg by mouth daily.      Cholecalciferol (VITAMIN D3) 125 MCG (5000 UT) TBDP Take 5,000 Units by mouth 3 (three) times a week.     clobetasol  ointment (TEMOVATE ) 0.05 % Apply 1 Application topically daily.     Continuous Glucose Receiver (FREESTYLE LIBRE 3 READER) DEVI 1 Act by Does not apply route daily. 1 each 3   Continuous Glucose Sensor (FREESTYLE LIBRE 3 PLUS SENSOR) MISC Change sensor every 15 days. 2 each 11   dicyclomine  (BENTYL ) 10 MG capsule TAKE 1 CAPSULE BY MOUTH 3 TIMES  DAILY BEFORE MEALS 270 capsule 0   diltiazem  (CARDIZEM ) 60 MG tablet Take 1 tablet (60 mg total) by mouth 2 (two) times daily as needed for up to 1 dose (Take 1 tab as needed for SVT twice daily). 30 tablet 0    fluticasone  (FLONASE ) 50 MCG/ACT nasal spray Place 1 spray into both nostrils daily.     gabapentin  (NEURONTIN ) 600 MG tablet Take 600 mg by mouth 2 (two) times daily.     Glucagon  (GVOKE HYPOPEN  2-PACK) 1 MG/0.2ML SOAJ Inject 1 Act into the skin daily as needed. 2 mL 5   HUMALOG  KWIKPEN 100 UNIT/ML KwikPen INJECT SUBCUTANEOUSLY 16 UNITS 3 TIMES DAILY 60 mL 2   Insulin  Glargine (TOUJEO  SOLOSTAR [place]) Inject 20 Units into the skin daily.     Insulin  Pen Needle (UNIFINE PENTIPS PLUS) 33G X 4 MM MISC Inject 1 Act into the skin 4 (four) times daily. 400 each 1   ipratropium (ATROVENT  HFA) 17 MCG/ACT inhaler USE 2 PUFFS EVERY 4 HOURS  AS NEEDED FOR WHEEZING. 12.9 g 3   Lancets (ONETOUCH DELICA PLUS LANCET33G) MISC USE AS DIRECTED TO CHECK BLOOD SUGAR 4 TIMES DAILY AFTER MEALS AND AT BEDTIME. 200 each 4   loperamide (IMODIUM) 2 MG capsule Take 2 mg by mouth as needed for diarrhea or loose stools.     metoprolol  tartrate (LOPRESSOR ) 25 MG tablet TAKE ONE-HALF TABLET BY MOUTH  TWICE DAILY 90 tablet 2   nystatin  ointment (MYCOSTATIN ) Apply 1 Application topically 2 (two) times daily as needed.     omeprazole (PRILOSEC) 20 MG capsule Take 20 mg by mouth daily.     ONETOUCH VERIO test strip CHECK BLOOD SUGAR 4 TIMES A DAY. 200 strip 5  rosuvastatin  (CRESTOR ) 20 MG tablet TAKE 1 TABLET BY MOUTH DAILY 90 tablet 1   Simethicone  125 MG CAPS Take 1 capsule by mouth daily as needed.     thiamine  (VITAMIN B-1) 50 MG tablet Take 1 tablet (50 mg total) by mouth daily. 90 tablet 1   tirzepatide (MOUNJARO) 7.5 MG/0.5ML Pen Inject 7.5 mg into the skin once a week.     traZODone  (DESYREL ) 50 MG tablet Take 50 mg by mouth at bedtime.     TRINTELLIX  20 MG TABS tablet Take 20 mg by mouth daily.     UNITHROID  50 MCG tablet TAKE 1 TABLET BY MOUTH DAILY  BEFORE BREAKFAST 90 tablet 3   zaleplon (SONATA) 10 MG capsule Take 10 mg by mouth at bedtime as needed for sleep.     lisinopril  (ZESTRIL ) 20 MG tablet Take 1 tablet (20 mg  total) by mouth daily.     No facility-administered medications prior to visit.    ROS Review of Systems  Constitutional:  Negative for appetite change, chills, diaphoresis, fatigue and fever.  HENT: Negative.    Eyes: Negative.   Respiratory:  Negative for cough, chest tightness, shortness of breath and wheezing.   Cardiovascular:  Negative for chest pain, palpitations and leg swelling.  Gastrointestinal:  Negative for abdominal pain, diarrhea, nausea and vomiting.  Endocrine: Negative.   Genitourinary:  Negative for difficulty urinating.  Musculoskeletal: Negative.  Negative for arthralgias and myalgias.  Skin: Negative.   Neurological:  Positive for dizziness and light-headedness.  Hematological:  Negative for adenopathy. Does not bruise/bleed easily.  Psychiatric/Behavioral: Negative.      Objective:  BP 100/68 (BP Location: Right Arm, Patient Position: Sitting, Cuff Size: Large)   Pulse 76   Temp 98.6 F (37 C) (Oral)   Resp 16   Ht 5' 2 (1.575 m)   Wt 216 lb 6.4 oz (98.2 kg)   LMP 12/31/1993   SpO2 95%   BMI 39.58 kg/m   BP Readings from Last 3 Encounters:  06/30/24 100/68  03/30/24 130/72  03/27/24 (!) 164/92    Wt Readings from Last 3 Encounters:  06/30/24 216 lb 6.4 oz (98.2 kg)  06/19/24 220 lb (99.8 kg)  03/30/24 226 lb (102.5 kg)    Physical Exam Vitals reviewed.  Constitutional:      General: She is not in acute distress.    Appearance: She is ill-appearing. She is not toxic-appearing or diaphoretic.  HENT:     Mouth/Throat:     Mouth: Mucous membranes are moist.  Eyes:     General: No scleral icterus.    Conjunctiva/sclera: Conjunctivae normal.  Cardiovascular:     Rate and Rhythm: Normal rate and regular rhythm.     Heart sounds: Normal heart sounds, S1 normal and S2 normal. Heart sounds not distant. No murmur heard.    No friction rub. No gallop.     Comments: EKG-- NSR, 75 bpm LAD +++artifact Anterior ischemia pattern is not  new Unchanged  Pulmonary:     Effort: Pulmonary effort is normal.     Breath sounds: No stridor. No wheezing, rhonchi or rales.  Abdominal:     General: Abdomen is protuberant. Bowel sounds are normal. There is no distension.     Palpations: Abdomen is soft. There is no hepatomegaly, splenomegaly or mass.     Tenderness: There is no abdominal tenderness.  Musculoskeletal:     Cervical back: Neck supple.     Right lower leg: No edema.  Left lower leg: No edema.  Lymphadenopathy:     Cervical: No cervical adenopathy.  Skin:    Coloration: Skin is pale.  Neurological:     General: No focal deficit present.     Mental Status: She is alert. Mental status is at baseline.  Psychiatric:        Mood and Affect: Mood normal.        Behavior: Behavior normal.     Lab Results  Component Value Date   WBC 8.7 06/30/2024   HGB 13.7 06/30/2024   HCT 41.4 06/30/2024   PLT 200.0 06/30/2024   GLUCOSE 137 (H) 03/30/2024   CHOL 107 03/30/2024   TRIG 96.0 03/30/2024   HDL 52.20 03/30/2024   LDLDIRECT 70.0 07/30/2022   LDLCALC 36 03/30/2024   ALT 14 03/30/2024   AST 19 03/30/2024   NA 140 03/30/2024   K 4.0 03/30/2024   CL 104 03/30/2024   CREATININE 1.38 (H) 03/30/2024   BUN 19 03/30/2024   CO2 28 03/30/2024   TSH 2.63 06/30/2024   INR 1.2 (H) 03/30/2024   HGBA1C 6.7 (H) 06/30/2024   MICROALBUR 0.9 03/30/2024    CT Angio Chest PE W and/or Wo Contrast Result Date: 03/20/2024 CLINICAL DATA:  Pulmonary embolism (PE) suspected, low to intermediate prob, positive D-dimer. Chest pain. Shortness of breath. EXAM: CT ANGIOGRAPHY CHEST WITH CONTRAST TECHNIQUE: Multidetector CT imaging of the chest was performed using the standard protocol during bolus administration of intravenous contrast. Multiplanar CT image reconstructions and MIPs were obtained to evaluate the vascular anatomy. RADIATION DOSE REDUCTION: This exam was performed according to the departmental dose-optimization program  which includes automated exposure control, adjustment of the mA and/or kV according to patient size and/or use of iterative reconstruction technique. CONTRAST:  50mL OMNIPAQUE  IOHEXOL  350 MG/ML SOLN COMPARISON:  CT angiography chest from 01/26/2015. FINDINGS: Cardiovascular: No evidence of embolism to the proximal subsegmental pulmonary artery level. Normal cardiac size. No pericardial effusion. No aortic aneurysm. There are coronary artery calcifications, in keeping with coronary artery disease. There are also minimal peripheral atherosclerotic vascular calcifications of thoracic aorta and its major branches. Mediastinum/Nodes: Visualized thyroid  gland appears grossly unremarkable. No solid / cystic mediastinal masses. The esophagus is nondistended precluding optimal assessment. No axillary, mediastinal or hilar lymphadenopathy by size criteria. Lungs/Pleura: The central tracheo-bronchial tree is patent. There are patchy areas of linear, plate-like atelectasis and/or scarring throughout bilateral lungs. No mass or consolidation. No pleural effusion or pneumothorax. No suspicious lung nodules. Upper Abdomen: There is reflux of intravenous contrast in the central intrahepatic veins, which can be seen with right heart strain. Visualized upper abdominal viscera within normal limits. Musculoskeletal: The visualized soft tissues of the chest wall are grossly unremarkable. No suspicious osseous lesions. There are mild multilevel degenerative changes in the visualized spine. Review of the MIP images confirms the above findings. IMPRESSION: 1. No embolism to the proximal subsegmental pulmonary artery level. 2. No lung mass, consolidation, pleural effusion or pneumothorax. 3. Multiple other nonacute observations, as described above. 4. Aortic atherosclerosis. Aortic Atherosclerosis (ICD10-I70.0). Electronically Signed   By: Ree Molt M.D.   On: 03/20/2024 09:36   DG Chest Portable 1 View Result Date:  03/20/2024 CLINICAL DATA:  Chest pain, tachycardia. EXAM: PORTABLE CHEST 1 VIEW COMPARISON:  January 26, 2015. FINDINGS: The heart size and mediastinal contours are within normal limits. Both lungs are clear. The visualized skeletal structures are unremarkable. IMPRESSION: No active disease. Electronically Signed   By: Lynwood Landy Mickey CHRISTELLA.D.  On: 03/20/2024 08:18    Assessment & Plan:  Idiopathic hypotension -     EKG 12-Lead -     Cortisol; Future -     Brain natriuretic peptide; Future -     CBC with Differential/Platelet; Future -     D-dimer, quantitative; Future -     Troponin I (High Sensitivity); Future -     TSH; Future  Abnormal electrocardiogram (ECG) (EKG) -     D-dimer, quantitative; Future -     Troponin I (High Sensitivity); Future  Acquired hypothyroidism -     CBC with Differential/Platelet; Future -     TSH; Future  Stage 3a chronic kidney disease (HCC) -     Urinalysis, Routine w reflex microscopic; Future -     CBC with Differential/Platelet; Future  Insulin -requiring or dependent type II diabetes mellitus (HCC) -     Hemoglobin A1c; Future  Acute cystitis without hematuria -     Nitrofurantoin  Monohyd Macro; Take 1 capsule (100 mg total) by mouth 2 (two) times daily for 5 days.  Dispense: 10 capsule; Refill: 0     Follow-up: Return in about 3 weeks (around 07/21/2024).  Debby Molt, MD

## 2024-07-01 DIAGNOSIS — N3000 Acute cystitis without hematuria: Secondary | ICD-10-CM | POA: Insufficient documentation

## 2024-07-01 MED ORDER — NITROFURANTOIN MONOHYD MACRO 100 MG PO CAPS: 100.0000 mg | ORAL_CAPSULE | Freq: Two times a day (BID) | ORAL | 0 refills | Status: AC

## 2024-07-20 ENCOUNTER — Other Ambulatory Visit: Payer: Self-pay | Admitting: Internal Medicine

## 2024-07-20 DIAGNOSIS — E785 Hyperlipidemia, unspecified: Secondary | ICD-10-CM

## 2024-09-09 DIAGNOSIS — E1140 Type 2 diabetes mellitus with diabetic neuropathy, unspecified: Secondary | ICD-10-CM | POA: Diagnosis not present

## 2024-09-09 DIAGNOSIS — N184 Chronic kidney disease, stage 4 (severe): Secondary | ICD-10-CM | POA: Diagnosis not present

## 2024-09-09 DIAGNOSIS — Z794 Long term (current) use of insulin: Secondary | ICD-10-CM | POA: Diagnosis not present

## 2024-09-09 DIAGNOSIS — K760 Fatty (change of) liver, not elsewhere classified: Secondary | ICD-10-CM | POA: Diagnosis not present

## 2024-09-14 ENCOUNTER — Inpatient Hospital Stay: Admission: RE | Admit: 2024-09-14 | Source: Ambulatory Visit

## 2024-09-25 ENCOUNTER — Ambulatory Visit: Attending: Cardiology | Admitting: Cardiology

## 2024-09-25 ENCOUNTER — Encounter: Payer: Self-pay | Admitting: Cardiology

## 2024-09-25 VITALS — BP 83/54 | HR 81 | Resp 16 | Ht 62.0 in | Wt 204.8 lb

## 2024-09-25 DIAGNOSIS — E1122 Type 2 diabetes mellitus with diabetic chronic kidney disease: Secondary | ICD-10-CM

## 2024-09-25 DIAGNOSIS — I9589 Other hypotension: Secondary | ICD-10-CM

## 2024-09-25 DIAGNOSIS — Z794 Long term (current) use of insulin: Secondary | ICD-10-CM

## 2024-09-25 DIAGNOSIS — N1832 Chronic kidney disease, stage 3b: Secondary | ICD-10-CM

## 2024-09-25 DIAGNOSIS — I471 Supraventricular tachycardia, unspecified: Secondary | ICD-10-CM

## 2024-09-25 NOTE — Patient Instructions (Addendum)
 Medication Instructions:  Your physician has recommended you make the following change in your medication: Stop metoprolol    *If you need a refill on your cardiac medications before your next appointment, please call your pharmacy*  Lab Work: none If you have labs (blood work) drawn today and your tests are completely normal, you will receive your results only by: MyChart Message (if you have MyChart) OR A paper copy in the mail If you have any lab test that is abnormal or we need to change your treatment, we will call you to review the results.  Testing/Procedures: none  Follow-Up: At [facility], you and your health needs are our priority.  As part of our continuing mission to provide you with exceptional heart care, our providers are all part of one team.  This team includes your primary Cardiologist (physician) and Advanced Practice Providers or APPs (Physician Assistants and Nurse Practitioners) who all work together to provide you with the care you need, when you need it.  Your next appointment:   As needed  Provider:   Gordy Bergamo, MD    We recommend signing up for the patient portal called MyChart.  Sign up information is provided on this After Visit Summary.  MyChart is used to connect with patients for Virtual Visits (Telemedicine).  Patients are able to view lab/test results, encounter notes, upcoming appointments, etc.  Non-urgent messages can be sent to your provider as well.   To learn more about what you can do with MyChart, go to [email or web address].   Other Instructions  You have been referred to see an electrophysiologist in our office.  Office will call you to schedule this appointment

## 2024-09-25 NOTE — Progress Notes (Signed)
 Cardiology Office Note:  .   Date:  09/25/2024  ID:  Meredith Hayden, DOB 1950-07-27, MRN [id number] PCP: Joshua Debby CROME, MD  [facility] Providers Cardiologist:  Gordy Bergamo, MD   History of Present Illness: .   Meredith Hayden is a 74 y.o. . Caucasian female patient with history of SVT noted om 01/25/2015, on low-dose beta-blocker therapy. Past medical history also includes hyperlipidemia, well-controlled diabetes mellitus. Presented to the emergency room on 03/20/2024 with chest pain, dyspnea, nausea with SVT, converted to sinus rhythm with IV diltiazem  15 mg and 25 mg of oral metoprolol .  I had seen her 6 months ago.  She now presents for follow-up.    She has not had any further palpitations. She complains of dizziness and low BP in spite of stopping lisinopril . She has been losing weight with Maunjaro by about 50 Lbs over the past one year.   Cardiac Studies relevent.    ECHOCARDIOGRAM COMPLETE 01/16/2024  1. Left ventricular ejection fraction, by estimation, is 60 to 65%. The left ventricle has normal function. The left ventricle has no regional wall motion abnormalities. Left ventricular diastolic parameters are consistent with Grade II diastolic dysfunction (pseudonormalization). Elevated left atrial pressure. 2. Right ventricular systolic function is normal. The right ventricular size is normal. 3.  No significant valvular abnormality.  MYOCARDIAL PERFUSION IMAGING 08/30/2021    LV perfusion is normal. There is no evidence of ischemia. There is no evidence of infarction.   Nuclear stress EF: 64 %. The left ventricular ejection fraction is normal (55-65%).    Discussed the use of AI scribe software for clinical note transcription with the patient, who gave verbal consent to proceed.  History of Present Illness Meredith Hayden is a 74 year old female with paroxysmal supraventricular tachycardia who presents with dizziness. She was referred by Dr.  Joshua for management of her cardiovascular issues.  She experiences dizziness, which she associates with low blood pressure. Her lisinopril  was discontinued due to hypotension, and she is currently not taking it. She has lost approximately 50 pounds over the past year, attributed to Mounjaro, which has decreased her appetite and contributed to hypotension.  She has paroxysmal supraventricular tachycardia but no recent episodes of tachycardia. She takes diltiazem  60 mg as needed for SVT but has not required it recently. Her current antihypertensive medication is metoprolol  25 mg once daily.  She manages her diabetes with Mounjaro, with an A1c of 6.7%, and has not needed insulin  recently. Her LDL is well controlled at 36. She has stage 3B chronic kidney disease, which is stable. She tries to stay hydrated, drinking to thirst and adding electrolytes to her water once a day. Light snoring is noted by her companion.  Labs   Lab Results  Component Value Date   CHOL 107 03/30/2024   HDL 52.20 03/30/2024   LDLCALC 36 03/30/2024   LDLDIRECT 70.0 07/30/2022   TRIG 96.0 03/30/2024   CHOLHDL 2 03/30/2024   No results found for: LIPOA  Recent Labs    12/30/23 1618 03/20/24 0652 03/30/24 1540  NA 138 139 140  K 5.2 No hemolysis seen* 4.3 4.0  CL 102 101 104  CO2 27 26 28   GLUCOSE 111* 164* 137*  BUN 34* 20 19  CREATININE 2.11* 1.54* 1.38*  CALCIUM  9.5 9.0 8.9  GFRNONAA  --  36*  --     Lab Results  Component Value Date   ALT 14 03/30/2024   AST 19 03/30/2024  ALKPHOS 70 03/30/2024   BILITOT 0.7 03/30/2024      Latest Ref Rng & Units 06/30/2024    3:33 PM 03/30/2024    3:40 PM 03/20/2024    6:52 AM  CBC  WBC 4.0 - 10.5 K/uL 8.7  8.8  13.7   Hemoglobin 12.0 - 15.0 g/dL 86.2  87.0  86.0   Hematocrit 36.0 - 46.0 % 41.4  39.6  43.2   Platelets 150.0 - 400.0 K/uL 200.0  221.0  230    Lab Results  Component Value Date   HGBA1C 6.7 (H) 06/30/2024    Lab Results  Component Value  Date   TSH 2.63 06/30/2024     ROS  Review of Systems  Cardiovascular:  Negative for chest pain, dyspnea on exertion and leg swelling.  Neurological:  Positive for dizziness.   Physical Exam:   VS:  BP (!) 83/54 (BP Location: Right Arm, Patient Position: Sitting, Cuff Size: Large)   Pulse 81   Resp 16   Ht 5' 2 (1.575 m)   Wt 204 lb 12.8 oz (92.9 kg)   LMP 12/31/1993   SpO2 93%   BMI 37.46 kg/m    Wt Readings from Last 3 Encounters:  09/25/24 204 lb 12.8 oz (92.9 kg)  06/30/24 216 lb 6.4 oz (98.2 kg)  06/19/24 220 lb (99.8 kg)    BP Readings from Last 3 Encounters:  09/25/24 (!) 83/54  06/30/24 100/68  03/30/24 130/72   Physical Exam Constitutional:      Appearance: She is obese.  Neck:     Vascular: No carotid bruit or JVD.  Cardiovascular:     Rate and Rhythm: Normal rate and regular rhythm.     Pulses: Intact distal pulses.     Heart sounds: Normal heart sounds. No murmur heard.    No gallop.  Pulmonary:     Effort: Pulmonary effort is normal.     Breath sounds: Normal breath sounds.  Abdominal:     General: Bowel sounds are normal.     Palpations: Abdomen is soft.  Musculoskeletal:     Right lower leg: No edema.     Left lower leg: No edema.    EKG:         ASSESSMENT AND PLAN: .      ICD-10-CM   1. PSVT (paroxysmal supraventricular tachycardia)  I47.10 Ambulatory referral to Cardiac Electrophysiology    2. Iatrogenic hypotension  I95.89     3. Type 2 diabetes mellitus with stage 3b chronic kidney disease, with long-term current use of insulin  [facility])  E11.22    N18.32    Z79.4      Assessment & Plan Paroxysmal supraventricular tachycardia (PSVT) PSVT is well-documented from an emergency room visit on March 20, 2024. No recent episodes reported. Discussed potential for a permanent fix through ablation by an electrophysiologist with a success rate of over 90%. - Refer to electrophysiologist for evaluation and potential ablation of PSVT. -  Continue diltiazem  60 mg as needed for SVT episodes. - Educate on Valsalva maneuver for SVT management.  Iatrogenic hypotension Hypotension likely due to weight loss from Mounjaro and metoprolol  use and decreased oral fluid intake. Symptoms include dizziness. Discussed stopping metoprolol  and increasing salt intake to manage blood pressure. If hypotension persists, midodrine may be considered. - Stop metoprolol  25 mg BID. - Encourage liberal salt intake. - Advise trying Pedialyte for hydration and electrolyte balance. - Consider starting midodrine if hypotension persists despite current measures.  Type 2  diabetes mellitus with stage 3b chronic kidney disease, well controlled Diabetes is well controlled with an A1c of 6.7%. No recent insulin  use. Chronic kidney disease stage 3b is stable. No current use of ACE inhibitors or ARBs due to low blood pressure. - Continue current diabetes management without insulin . - Monitor kidney function and blood pressure.  Obesity, responding to therapy Significant weight loss of approximately 50 pounds over the past year attributed to Mounjaro use. Weight loss contributing to improved control of diabetes and blood pressure. - Continue Mounjaro therapy. - Monitor weight and nutritional intake.  Hyperlipidemia, well controlled Hyperlipidemia is well controlled with a calculated LDL of 36. She is on cholesterol medication. - Continue current cholesterol management.   Follow up: PRN with me, EP referral made.   Signed,  Gordy Bergamo, MD, [facility] 09/25/2024, 1:14 PM [facility] [address] [place], [place] [address] Phone: [phone]. Fax:  [phone]

## 2024-10-06 DIAGNOSIS — L304 Erythema intertrigo: Secondary | ICD-10-CM | POA: Diagnosis not present

## 2024-10-23 ENCOUNTER — Ambulatory Visit: Attending: Cardiovascular Disease | Admitting: Cardiovascular Disease

## 2024-10-23 ENCOUNTER — Encounter: Payer: Self-pay | Admitting: Cardiovascular Disease

## 2024-10-23 VITALS — BP 101/70 | HR 104 | Ht 62.0 in | Wt 195.0 lb

## 2024-10-23 DIAGNOSIS — R001 Bradycardia, unspecified: Secondary | ICD-10-CM | POA: Diagnosis not present

## 2024-10-23 DIAGNOSIS — Z01812 Encounter for preprocedural laboratory examination: Secondary | ICD-10-CM

## 2024-10-23 DIAGNOSIS — I471 Supraventricular tachycardia, unspecified: Secondary | ICD-10-CM

## 2024-10-23 NOTE — Patient Instructions (Signed)
 Medication Instructions:  Your physician recommends that you continue on your current medications as directed. Please refer to the Current Medication list given to you today.  *If you need a refill on your cardiac medications before your next appointment, please call your pharmacy*  Testing/Procedures: Ablation Your physician has recommended that you have an ablation. Catheter ablation is a medical procedure used to treat some cardiac arrhythmias (irregular heartbeats). During catheter ablation, a long, thin, flexible tube is put into a blood vessel in your groin (upper thigh), or neck. This tube is called an ablation catheter. It is then guided to your heart through the blood vessel. Radio frequency waves destroy small areas of heart tissue where abnormal heartbeats may cause an arrhythmia to start.   You are scheduled for SVT Ablation on Tuesday, December 16 with Dr. Dr. Nancey. Please arrive at the Main Entrance A at [facility]: [address] [place], [place] [address] at 7:30 AM   What To Expect:  Labs: you will need to have lab work drawn within 30 days of your procedure. Please go to any [facility] location to have these drawn - no appointment is needed.  You will receive procedure instructions either through MyChart or in the mail 4-6 week prior to your procedure.  After your procedure we recommend no driving for 4 days, no lifting over 5 lbs for 7 days, and no work or strenuous activity for 7 days.  Please contact our office at [phone] if you have any questions.    Follow-Up: We will contact you to schedule your post-procedure appointments.

## 2024-10-23 NOTE — Progress Notes (Signed)
 Electrophysiology Office Note:    Date:  10/23/2024   ID:  Oval, Moralez [birth year], MRN [id number]  PCP:  Joshua Debby CROME, MD   [facility] Providers Cardiologist:  Gordy Bergamo, MD     Referring MD: Bergamo Gordy, MD   History of Present Illness:    Meredith Hayden is a 74 y.o. female with a medical history significant for SVT, referred for arrhythmia management.      History of Present Illness She was diagnosed with SVT in January 2016.  This was managed with metoprolol .   Over the past year she has lost a significant amount of weight on Mounjaro, necessitating decrease in her blood pressure medications.  She did well from a rhythm standpoint but was seen in the ER on March 25 with chest pain, shortness of breath, and nausea and found to be in SVT.  She converted to sinus rhythm on IV diltiazem .         Today, she reports that she feels well and has no acute complaints.  EKGs/Labs/Other Studies Reviewed Today:     Echocardiogram:  TTE January 16, 2024 LVEF 60 to 65%.  Grade 2 diastolic dysfunction.  Mildly dilated left atrium.  Normal right atrial size trivial mitral valve regurgitation.   Monitors:  30 day monitor April 2024-- my interpretation Sinus rhythm, heart rate 42 to 110 bpm, average 59 bpm No significant arrhythmia events detected  Stress testing:  Myocardial perfusion imaging August 30, 2021 Normal LV perfusion.  No evidence of ischemia.  No evidence of infarction.    EKG:   EKG Interpretation Date/Time:  Friday October 23 2024 15:16:54 EDT Ventricular Rate:  105 PR Interval:    QRS Duration:  102 QT Interval:  302 QTC Calculation: 399 R Axis:   236  Text Interpretation: Sinus rhythm Right ventricular hypertrophy Inferior infarct , age undetermined Possible Anterolateral infarct (cited on or before 23-Oct-2024) When compared with ECG of 27-Mar-2024 10:51, No significant change was found Confirmed by Nancey Scotts  [id number]) on 10/23/2024 3:27:39 PM     Physical Exam:    VS:  BP 101/70 (BP Location: Right Arm, Patient Position: Sitting, Cuff Size: Large)   Pulse (!) 104   Ht 5' 2 (1.575 m)   Wt 195 lb (88.5 kg)   LMP 12/31/1993   SpO2 93%   BMI 35.67 kg/m     Wt Readings from Last 3 Encounters:  10/23/24 195 lb (88.5 kg)  09/25/24 204 lb 12.8 oz (92.9 kg)  06/30/24 216 lb 6.4 oz (98.2 kg)     GEN: Well nourished, well developed in no acute distress CARDIAC: RRR, no murmurs, rubs, gallops RESPIRATORY:  Normal work of breathing MUSCULOSKELETAL: no edema    ASSESSMENT & PLAN:     SVT Controlled reasonably well in the past with metoprolol  With sustained recurrence March 2025 Suspect AVNRT, 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 I explained that the risk for stroke, heart attack, need for open chest surgery, or even death is very low but not zero. she  expressed understanding and wishes to proceed.   History of hypertension Now blood pressures are on the low side She is off all antihypertensives.  Obesity Losing weight on Mounjaro    Signed, Eulas FORBES Furbish, MD  10/23/2024 3:44 PM    [facility]

## 2024-10-26 ENCOUNTER — Other Ambulatory Visit: Payer: Self-pay | Admitting: Medical Genetics

## 2024-10-26 DIAGNOSIS — Z006 Encounter for examination for normal comparison and control in clinical research program: Secondary | ICD-10-CM

## 2024-10-27 ENCOUNTER — Other Ambulatory Visit: Payer: Self-pay | Admitting: Internal Medicine

## 2024-10-27 DIAGNOSIS — E039 Hypothyroidism, unspecified: Secondary | ICD-10-CM

## 2024-11-20 ENCOUNTER — Telehealth: Payer: Self-pay

## 2024-11-20 NOTE — Telephone Encounter (Signed)
-----   Message from Nurse Aldona R sent at 10/23/2024  5:52 PM EDT ----- Regarding: 12/16 SVT Mealor Important: list procedure date as first item in subject line, followed by procedure type (e.g., 09/11/24 AFib ablation)  Precert:  MD: Mealor Type of ablation: SVT Diagnosis: SVT CPT code: SVT/WPW (06346) Ablation scheduled (date/time): 12/16 930  Procedure:  Added to calendar? Yes Orders entered? No, >30 days before procedure Letter complete? No, >30 days before procedure Scheduled with cath lab? Yes Any medications to hold? No Labs ordered (CBC, BMET, PT/INR if on warfarin): Yes Mapping system: CARTO (lab 4 or 6) CARTO/OPAL rep notified? Yes Cardiac CT needed? No Dye allergy? No Pre-meds ordered and instructions given? N/A Letter method: MyChart H&P: 10/24 Device: No  Follow-up:  Cassie/Angel, please schedule Routine.  Covering RN - please send this message to [employer], EP scheduler, EP Scheduling pool, EP [employer], and CT scheduler (Brittany Lynch/Stephanie Mogg), if indicated.

## 2024-11-23 ENCOUNTER — Encounter (HOSPITAL_COMMUNITY): Payer: Self-pay

## 2024-11-24 ENCOUNTER — Telehealth (HOSPITAL_COMMUNITY): Payer: Self-pay

## 2024-11-24 NOTE — Telephone Encounter (Signed)
 Attempted to reach patient to discuss upcoming procedure, no answer. Left VM for patient to return call.

## 2024-11-24 NOTE — Telephone Encounter (Signed)
 Spoke with patient to discuss upcoming procedure.   Labs: to be completed.   Any recent signs of acute illness or been started on antibiotics? No Any new medications started?No Any medications to hold?  Yes Humalog  (Insulin ): No bedtime dose of Humalog  the night before your procedure.  Day of procedure: Only take 1/2 of usual dose of Humalog  if CBG greater than 220 mg/dL.  Insulin  Glargine (Toujeo ) - DAY BEFORE PROCEDURE: If you take in the Morning- take morning dose as usual. If you take at Dinner/Bedtime - ONLY take 1/2 of your usual dose the night before your procedure. DAY OF PROCEDURE: If you take in the Morning - ONLY take 1/2 of your usual dose the morning of your procedure.   Avoid taking Diltiazem  for 3 days prior to procedure. Last dose December 12.  Medication instructions:  On the morning of your procedure DO NOT take any medication. or the procedure may be rescheduled. Nothing to eat or drink after midnight prior to your procedure.  Confirmed patient is scheduled for SVT Ablation on Tuesday, December 16 with Dr. Nancey. Instructed patient to arrive at the Main Entrance A at [facility]: [address] [place], [place] [address] and check in at Admitting at 7:30 AM.   Plan to go home the same day, you will only stay overnight if medically necessary. You MUST have a responsible adult to drive you home and MUST be with you the first 24 hours after you arrive home or your procedure could be cancelled.  Informed patient a nurse will call a day before the procedure to confirm arrival time and ensure instructions are followed.  Patient verbalized understanding to all instructions provided and agreed to proceed with procedure.   Advised patient to contact RN Navigator at [phone], to inform of any new medications started after call or concerns prior to procedure.

## 2024-12-04 LAB — BASIC METABOLIC PANEL WITH GFR
BUN/Creatinine Ratio: 8 — ABNORMAL LOW (ref 12–28)
BUN: 11 mg/dL (ref 8–27)
CO2: 24 mmol/L (ref 20–29)
Calcium: 9.2 mg/dL (ref 8.7–10.3)
Chloride: 101 mmol/L (ref 96–106)
Creatinine, Ser: 1.33 mg/dL — ABNORMAL HIGH (ref 0.57–1.00)
Glucose: 131 mg/dL — ABNORMAL HIGH (ref 70–99)
Potassium: 4 mmol/L (ref 3.5–5.2)
Sodium: 140 mmol/L (ref 134–144)
eGFR: 43 mL/min/1.73 — ABNORMAL LOW (ref >59)

## 2024-12-04 LAB — CBC
Hematocrit: 42.1 % (ref 34.0–46.6)
Hemoglobin: 13.3 g/dL (ref 11.1–15.9)
MCH: 30.9 pg (ref 26.6–33.0)
MCHC: 31.6 g/dL (ref 31.5–35.7)
MCV: 98 fL — ABNORMAL HIGH (ref 79–97)
Platelets: 212 x10E3/uL (ref 150–450)
RBC: 4.31 x10E6/uL (ref 3.77–5.28)
RDW: 12.3 % (ref 11.7–15.4)
WBC: 10.5 x10E3/uL (ref 3.4–10.8)

## 2024-12-07 LAB — HM DIABETES EYE EXAM

## 2024-12-11 ENCOUNTER — Ambulatory Visit: Payer: Self-pay

## 2024-12-11 LAB — TSH: TSH: 0.91 (ref 0.41–5.90)

## 2024-12-11 LAB — HEMOGLOBIN A1C: Hemoglobin A1C: 5.8

## 2024-12-14 NOTE — Pre-Procedure Instructions (Signed)
Instructed patient on the following items: Arrival time 0730 Nothing to eat or drink after midnight No meds AM of procedure Responsible person to drive you home and stay with you for 24 hrs

## 2024-12-15 ENCOUNTER — Ambulatory Visit: Payer: Self-pay

## 2024-12-15 ENCOUNTER — Ambulatory Visit (HOSPITAL_COMMUNITY)
Admission: RE | Admit: 2024-12-15 | Discharge: 2024-12-15 | Disposition: A | Attending: Cardiovascular Disease | Admitting: Cardiovascular Disease

## 2024-12-15 ENCOUNTER — Ambulatory Visit (HOSPITAL_COMMUNITY): Admitting: Anesthesiology

## 2024-12-15 ENCOUNTER — Encounter (HOSPITAL_COMMUNITY): Payer: Self-pay | Admitting: Cardiovascular Disease

## 2024-12-15 ENCOUNTER — Encounter (HOSPITAL_COMMUNITY): Admission: RE | Disposition: A | Payer: Self-pay | Source: Home / Self Care | Attending: Cardiovascular Disease

## 2024-12-15 ENCOUNTER — Other Ambulatory Visit: Payer: Self-pay

## 2024-12-15 DIAGNOSIS — I471 Supraventricular tachycardia, unspecified: Secondary | ICD-10-CM | POA: Diagnosis not present

## 2024-12-15 DIAGNOSIS — I129 Hypertensive chronic kidney disease with stage 1 through stage 4 chronic kidney disease, or unspecified chronic kidney disease: Secondary | ICD-10-CM

## 2024-12-15 DIAGNOSIS — I483 Typical atrial flutter: Secondary | ICD-10-CM | POA: Diagnosis not present

## 2024-12-15 DIAGNOSIS — I10 Essential (primary) hypertension: Secondary | ICD-10-CM | POA: Diagnosis not present

## 2024-12-15 DIAGNOSIS — Z79899 Other long term (current) drug therapy: Secondary | ICD-10-CM | POA: Diagnosis not present

## 2024-12-15 DIAGNOSIS — E1122 Type 2 diabetes mellitus with diabetic chronic kidney disease: Secondary | ICD-10-CM

## 2024-12-15 DIAGNOSIS — N183 Chronic kidney disease, stage 3 unspecified: Secondary | ICD-10-CM | POA: Diagnosis not present

## 2024-12-15 DIAGNOSIS — K219 Gastro-esophageal reflux disease without esophagitis: Secondary | ICD-10-CM | POA: Diagnosis not present

## 2024-12-15 DIAGNOSIS — E669 Obesity, unspecified: Secondary | ICD-10-CM | POA: Diagnosis not present

## 2024-12-15 DIAGNOSIS — I4719 Other supraventricular tachycardia: Secondary | ICD-10-CM | POA: Diagnosis present

## 2024-12-15 DIAGNOSIS — Z6837 Body mass index (BMI) 37.0-37.9, adult: Secondary | ICD-10-CM | POA: Diagnosis not present

## 2024-12-15 HISTORY — PX: SVT ABLATION: EP1225

## 2024-12-15 LAB — GLUCOSE, CAPILLARY: Glucose-Capillary: 99 mg/dL (ref 70–99)

## 2024-12-15 SURGERY — SVT ABLATION
Anesthesia: General

## 2024-12-15 MED ORDER — BUPIVACAINE HCL (PF) 0.25 % IJ SOLN
INTRAMUSCULAR | Status: AC
  Filled 2024-12-15: qty 60

## 2024-12-15 MED ORDER — SODIUM CHLORIDE 0.9 % IV SOLN: INTRAVENOUS | Status: DC

## 2024-12-15 MED ORDER — PHENYLEPHRINE 80 MCG/ML (10ML) SYRINGE FOR IV PUSH (FOR BLOOD PRESSURE SUPPORT)
PREFILLED_SYRINGE | INTRAVENOUS | Status: DC | PRN
  Administered 2024-12-15: 12:00:00 160 ug via INTRAVENOUS
  Administered 2024-12-15: 12:00:00 240 ug via INTRAVENOUS
  Administered 2024-12-15 (×9): 160 ug via INTRAVENOUS

## 2024-12-15 MED ORDER — HEPARIN SODIUM (PORCINE) 1000 UNIT/ML IJ SOLN
INTRAMUSCULAR | Status: AC
  Filled 2024-12-15: qty 10

## 2024-12-15 MED ORDER — MIDAZOLAM HCL (PF) 2 MG/2ML IJ SOLN
INTRAMUSCULAR | Status: DC | PRN
  Administered 2024-12-15: 10:00:00 1 mg via INTRAVENOUS
  Administered 2024-12-15: 11:00:00 2 mg via INTRAVENOUS
  Administered 2024-12-15: 10:00:00 1 mg via INTRAVENOUS

## 2024-12-15 MED ORDER — ISOPROTERENOL HCL 0.2 MG/ML IJ SOLN
INTRAVENOUS | Status: DC | PRN
  Administered 2024-12-15: 10:00:00 2 ug/min via INTRAVENOUS

## 2024-12-15 MED ORDER — PROPOFOL 500 MG/50ML IV EMUL
INTRAVENOUS | Status: DC | PRN
  Administered 2024-12-15: 11:00:00 50 ug/kg/min via INTRAVENOUS

## 2024-12-15 MED ORDER — ONDANSETRON HCL 4 MG/2ML IJ SOLN: 4.0000 mg | Freq: Four times a day (QID) | INTRAMUSCULAR | Status: DC | PRN

## 2024-12-15 MED ORDER — OXYCODONE HCL 5 MG PO TABS: 5.0000 mg | ORAL_TABLET | Freq: Once | ORAL | Status: DC | PRN

## 2024-12-15 MED ORDER — FENTANYL CITRATE (PF) 100 MCG/2ML IJ SOLN
INTRAMUSCULAR | Status: DC | PRN
  Administered 2024-12-15: 10:00:00 25 ug via INTRAVENOUS
  Administered 2024-12-15 (×2): 50 ug via INTRAVENOUS
  Administered 2024-12-15: 10:00:00 25 ug via INTRAVENOUS
  Administered 2024-12-15: 11:00:00 50 ug via INTRAVENOUS

## 2024-12-15 MED ORDER — SODIUM CHLORIDE 0.9% FLUSH: 3.0000 mL | INTRAVENOUS | Status: DC | PRN

## 2024-12-15 MED ORDER — OXYCODONE HCL 5 MG/5ML PO SOLN: 5.0000 mg | Freq: Once | ORAL | Status: DC | PRN

## 2024-12-15 MED ORDER — FENTANYL CITRATE (PF) 100 MCG/2ML IJ SOLN
INTRAMUSCULAR | Status: AC
  Filled 2024-12-15: qty 2

## 2024-12-15 MED ORDER — ISOPROTERENOL HCL 0.2 MG/ML IJ SOLN
INTRAMUSCULAR | Status: AC
  Filled 2024-12-15: qty 5

## 2024-12-15 MED ORDER — MIDAZOLAM HCL 2 MG/2ML IJ SOLN
INTRAMUSCULAR | Status: AC
  Filled 2024-12-15: qty 2

## 2024-12-15 MED ORDER — FENTANYL CITRATE (PF) 100 MCG/2ML IJ SOLN: 25.0000 ug | INTRAMUSCULAR | Status: DC | PRN

## 2024-12-15 MED ORDER — SODIUM CHLORIDE 0.9 % IV SOLN: 250.0000 mL | INTRAVENOUS | Status: DC | PRN

## 2024-12-15 MED ORDER — HEPARIN (PORCINE) IN NACL 1000-0.9 UT/500ML-% IV SOLN
INTRAVENOUS | Status: DC | PRN
  Administered 2024-12-15: 12:00:00 500 mL

## 2024-12-15 MED ORDER — ONDANSETRON HCL 4 MG/2ML IJ SOLN: 4.0000 mg | Freq: Once | INTRAMUSCULAR | Status: DC | PRN

## 2024-12-15 MED ORDER — BUPIVACAINE HCL (PF) 0.25 % IJ SOLN
INTRAMUSCULAR | Status: DC | PRN
  Administered 2024-12-15: 12:00:00 60 mL

## 2024-12-15 SURGICAL SUPPLY — 11 items
CATH DECANAV D CURVE (CATHETERS) IMPLANT
CATH JOSEPH QUAD ALLRED 6F REP (CATHETERS) IMPLANT
CATH SMTCH THERMOCOOL SF FJ (CATHETERS) IMPLANT
DEVICE CLOSURE MYNXGRIP 6/7F (Vascular Products) IMPLANT
PACK EP LF (CUSTOM PROCEDURE TRAY) ×1 IMPLANT
PAD DEFIB RADIO PHYSIO CONN (PAD) ×1 IMPLANT
PATCH CARTO3 (PAD) IMPLANT
SHEATH PINNACLE 6F 10CM (SHEATH) IMPLANT
SHEATH PINNACLE 8F 10CM (SHEATH) IMPLANT
SHEATH PROBE COVER 6X72 (BAG) IMPLANT
TUBING SMART ABLATE COOLFLOW (TUBING) IMPLANT

## 2024-12-15 NOTE — H&P (Signed)
°  Electrophysiology Office Note:    Date:  12/15/2024   ID:  Meredith, Hayden Dec 02, 1950, MRN [id number]  PCP:  Joshua Debby CROME, MD   [facility] Providers Cardiologist:  Gordy Bergamo, MD     Referring MD: No ref. provider found   History of Present Illness:    Meredith Hayden is a 74 y.o. female with a medical history significant for SVT, referred for arrhythmia management.      History of Present Illness She was diagnosed with SVT in January 2016.  This was managed with metoprolol .   Over the past year she has lost a significant amount of weight on Mounjaro, necessitating decrease in her blood pressure medications.  She did well from a rhythm standpoint but was seen in the ER on March 25 with chest pain, shortness of breath, and nausea and found to be in SVT.  She converted to sinus rhythm on IV diltiazem .         Today, she reports that she feels well and has no acute complaints. She presents today for EP study and ablation. She reports no recent changes in diagnoses or medications. No new problems. All questions answered.  EKGs/Labs/Other Studies Reviewed Today:     Echocardiogram:  TTE January 16, 2024 LVEF 60 to 65%.  Grade 2 diastolic dysfunction.  Mildly dilated left atrium.  Normal right atrial size trivial mitral valve regurgitation.   Monitors:  30 day monitor April 2024-- my interpretation Sinus rhythm, heart rate 42 to 110 bpm, average 59 bpm No significant arrhythmia events detected  Stress testing:  Myocardial perfusion imaging August 30, 2021 Normal LV perfusion.  No evidence of ischemia.  No evidence of infarction.    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 I explained that the risk for stroke, heart attack, need for open chest surgery, or even death is very low but not zero. she  expressed understanding and wishes to proceed.   History of hypertension Now blood pressures are on the low side She is off all antihypertensives.  Obesity Losing weight on Mounjaro    Signed, Eulas FORBES Furbish, MD  12/15/2024 9:02 AM    [facility]

## 2024-12-15 NOTE — Discharge Instructions (Signed)

## 2024-12-15 NOTE — Anesthesia Preprocedure Evaluation (Addendum)
 Anesthesia Evaluation  Patient identified by MRN, date of birth, ID band Patient awake    Reviewed: Allergy & Precautions, H&P , NPO status , Patient's Chart, lab work & pertinent test results  Airway Mallampati: II  TM Distance: >3 FB Neck ROM: Full    Dental no notable dental hx. (+) Teeth Intact, Dental Advisory Given   Pulmonary neg pulmonary ROS, asthma , sleep apnea    Pulmonary exam normal breath sounds clear to auscultation       Cardiovascular Exercise Tolerance: Good hypertension, Pt. on medications Normal cardiovascular exam Rhythm:Regular Rate:Normal  ECHO 25'   1. Left ventricular ejection fraction, by estimation, is 60 to 65%. The  left ventricle has normal function. The left ventricle has no regional  wall motion abnormalities. Left ventricular diastolic parameters are  consistent with Grade II diastolic  dysfunction (pseudonormalization). Elevated left atrial pressure.   2. Right ventricular systolic function is normal. The right ventricular  size is normal. Tricuspid regurgitation signal is inadequate for assessing  PA pressure.   3. Left atrial size was mildly dilated.   4. The mitral valve is normal in structure. Trivial mitral valve  regurgitation.   5. The aortic valve was not well visualized. Aortic valve regurgitation  is not visualized. No aortic stenosis is present.   6. The inferior vena cava is normal in size with greater than 50%  respiratory variability, suggesting right atrial pressure of 3 mmHg.     Neuro/Psych  PSYCHIATRIC DISORDERS Anxiety Depression    negative neurological ROS  negative psych ROS   GI/Hepatic negative GI ROS, Neg liver ROS,GERD  Medicated and Controlled,,  Endo/Other  negative endocrine ROSdiabetes, Type 2Hypothyroidism    Renal/GU CRFRenal diseasenegative Renal ROS  negative genitourinary   Musculoskeletal negative musculoskeletal ROS (+) Arthritis ,     Abdominal   Peds negative pediatric ROS (+)  Hematology negative hematology ROS (+)   Anesthesia Other Findings   Reproductive/Obstetrics negative OB ROS                              Anesthesia Physical Anesthesia Plan  ASA: 3  Anesthesia Plan: General   Post-op Pain Management: Minimal or no pain anticipated   Induction: Intravenous  PONV Risk Score and Plan: 3 and Ondansetron , Dexamethasone and Treatment may vary due to age or medical condition  Airway Management Planned: Oral ETT  Additional Equipment: None  Intra-op Plan:   Post-operative Plan: Extubation in OR  Informed Consent: I have reviewed the patients History and Physical, chart, labs and discussed the procedure including the risks, benefits and alternatives for the proposed anesthesia with the patient or authorized representative who has indicated his/her understanding and acceptance.       Plan Discussed with: Anesthesiologist and CRNA  Anesthesia Plan Comments: (  )         Anesthesia Quick Evaluation

## 2024-12-15 NOTE — Transfer of Care (Signed)
 Immediate Anesthesia Transfer of Care Note  Patient: Meredith Hayden  Procedure(s) Performed: SVT ABLATION  Patient Location: Cath Lab  Anesthesia Type:MAC  Level of Consciousness: awake, alert , and oriented  Airway & Oxygen Therapy: Patient Spontanous Breathing  Post-op Assessment: Report given to RN and Post -op Vital signs reviewed and stable  Post vital signs: Reviewed and stable  Last Vitals:  Vitals Value Taken Time  BP 95/61 12/15/24 12:11  Temp    Pulse 92 12/15/24 12:12  Resp 13 12/15/24 12:11  SpO2 91 % 12/15/24 12:12  Vitals shown include unfiled device data.  Last Pain:  Vitals:   12/15/24 0810  TempSrc: Oral         Complications: No notable events documented.

## 2024-12-15 NOTE — Progress Notes (Signed)
 Patient walked to the bathroom without difficulties. Right groin level 0, clean, dry, and intact.

## 2024-12-16 ENCOUNTER — Encounter (HOSPITAL_COMMUNITY): Payer: Self-pay | Admitting: Cardiovascular Disease

## 2024-12-16 ENCOUNTER — Telehealth (HOSPITAL_COMMUNITY): Payer: Self-pay

## 2024-12-16 MED FILL — Heparin Sodium (Porcine) Inj 1000 Unit/ML: INTRAMUSCULAR | Qty: 10 | Status: AC

## 2024-12-16 NOTE — Telephone Encounter (Signed)
 Spoke with patient to complete post procedure follow up call.  Patient reports no complications with groin sites.   Instructions reviewed with patient:  Remove large bandage at puncture site after 24 hours. It is normal to have bruising, tenderness, mild swelling, and a pea or marble sized lump/knot at the groin site which can take up to three months to resolve.  Get help right away if you notice sudden swelling at the puncture site.  Check your puncture site every day for signs of infection: fever, redness, swelling, pus drainage, warmth, foul odor or excessive pain. If this occurs, please call [phone], to speak with the RN Navigator. Get help right away if your puncture site is bleeding and the bleeding does not stop after applying firm pressure to the area.  You may continue to have skipped beats during the first several months after your procedure.  You will follow up with the APP 1 month after your procedure.  Activity restrictions reviewed.  Patient verbalized understanding to all instructions provided.

## 2024-12-16 NOTE — Anesthesia Postprocedure Evaluation (Signed)
 Anesthesia Post Note  Patient: Meredith Hayden  Procedure(s) Performed: SVT ABLATION     Patient location during evaluation: PACU Anesthesia Type: General Level of consciousness: awake and alert Pain management: pain level controlled Vital Signs Assessment: post-procedure vital signs reviewed and stable Respiratory status: spontaneous breathing, nonlabored ventilation, respiratory function stable and patient connected to nasal cannula oxygen Cardiovascular status: blood pressure returned to baseline and stable Postop Assessment: no apparent nausea or vomiting Anesthetic complications: no   No notable events documented.  Last Vitals:  Vitals:   12/15/24 1415 12/15/24 1433  BP: 121/74 104/64  Pulse: 81 72  Resp: 16 13  Temp:    SpO2: 95% 95%    Last Pain:  Vitals:   12/15/24 1346  TempSrc:   PainSc: 4    Pain Goal:                   Meredith Hayden

## 2024-12-29 ENCOUNTER — Ambulatory Visit: Admitting: Internal Medicine

## 2024-12-29 ENCOUNTER — Encounter: Payer: Self-pay | Admitting: Internal Medicine

## 2024-12-29 VITALS — BP 124/88 | HR 89 | Temp 98.2°F | Resp 16 | Ht 60.0 in | Wt 188.8 lb

## 2024-12-29 DIAGNOSIS — N1832 Chronic kidney disease, stage 3b: Secondary | ICD-10-CM | POA: Insufficient documentation

## 2024-12-29 DIAGNOSIS — Z0000 Encounter for general adult medical examination without abnormal findings: Secondary | ICD-10-CM | POA: Diagnosis not present

## 2024-12-29 DIAGNOSIS — E1122 Type 2 diabetes mellitus with diabetic chronic kidney disease: Secondary | ICD-10-CM | POA: Diagnosis not present

## 2024-12-29 DIAGNOSIS — Z0001 Encounter for general adult medical examination with abnormal findings: Secondary | ICD-10-CM

## 2024-12-29 DIAGNOSIS — K760 Fatty (change of) liver, not elsewhere classified: Secondary | ICD-10-CM

## 2024-12-29 DIAGNOSIS — J410 Simple chronic bronchitis: Secondary | ICD-10-CM | POA: Insufficient documentation

## 2024-12-29 DIAGNOSIS — E785 Hyperlipidemia, unspecified: Secondary | ICD-10-CM | POA: Diagnosis not present

## 2024-12-29 LAB — HEPATIC FUNCTION PANEL
ALT: 9 U/L (ref 3–35)
AST: 16 U/L (ref 5–37)
Albumin: 4.6 g/dL (ref 3.5–5.2)
Alkaline Phosphatase: 64 U/L (ref 39–117)
Bilirubin, Direct: 0.2 mg/dL (ref 0.1–0.3)
Total Bilirubin: 0.7 mg/dL (ref 0.2–1.2)
Total Protein: 7.4 g/dL (ref 6.0–8.3)

## 2024-12-29 LAB — LIPID PANEL
Cholesterol: 104 mg/dL (ref 28–200)
HDL: 43.2 mg/dL
LDL Cholesterol: 31 mg/dL (ref 10–99)
NonHDL: 61.07
Total CHOL/HDL Ratio: 2
Triglycerides: 150 mg/dL — ABNORMAL HIGH (ref 10.0–149.0)
VLDL: 30 mg/dL (ref 0.0–40.0)

## 2024-12-29 LAB — PROTIME-INR
INR: 1.1 ratio — ABNORMAL HIGH (ref 0.8–1.0)
Prothrombin Time: 12 s (ref 9.6–13.1)

## 2024-12-29 MED ORDER — ATROVENT HFA 17 MCG/ACT IN AERS: INHALATION_SPRAY | RESPIRATORY_TRACT | 3 refills | Status: AC

## 2024-12-29 MED ORDER — EMPAGLIFLOZIN 10 MG PO TABS: 10.0000 mg | ORAL_TABLET | Freq: Every day | ORAL | 1 refills | Status: AC

## 2024-12-29 NOTE — Progress Notes (Unsigned)
 "  Subjective:  Patient ID: Meredith Hayden, female    DOB: [birth year]  Age: 74 y.o. MRN: [id number]  CC: Annual Exam, Hyperlipidemia, Hypothyroidism, and Diabetes   HPI Meredith Hayden presents for a CPX and f/up ---  Discussed the use of AI scribe software for clinical note transcription with the patient, who gave verbal consent to proceed.  History of Present Illness Meredith Hayden is a 74 year old female with chronic kidney disease who presents for follow-up after a cardiac procedure.  She recently underwent a cardiac ablation procedure where two areas were treated. No complications such as palpitations, dizziness, or lightheadedness have occurred following the procedure. Occasionally, she feels her heart is 'doing a little jumpy thing,' but upon checking, it seems fine.  Her recent lab work showed an A1c of 5.8 and a normal TSH. She is concerned about her kidney function, noting a GFR of 43. She mentions that this has improved over time.  She has been using Mounjaro for weight management, prescribed by her endocrinologist. She experiences slow gastric emptying and constipation, which she manages with MAG 07 if she hasn't had a bowel movement for a day. She notes a history of diarrhea, which has improved with Mounjaro.  She takes rosuvastatin  without any noted side effects. She occasionally uses an inhaler, which she believes is Atrovent , and notes that it is expired. No bleeding or trouble breathing recently.  Previously, she took Kerendia  for kidney protection but stopped due to high potassium levels during an illness last September. She has not taken Farxiga or Jardiance  before.  No palpitations, dizziness, lightheadedness, nausea, vomiting, or pain. Reports occasional constipation and some pain. No bleeding or trouble breathing.     Outpatient Medications Prior to Visit  Medication Sig Dispense Refill   azelastine (ASTELIN) 0.1 % nasal spray Place 1  spray into both nostrils daily. Use in each nostril as directed     cetirizine (ZYRTEC) 10 MG tablet Take 10 mg by mouth daily.      Cholecalciferol (VITAMIN D3) 125 MCG (5000 UT) TBDP Take 5,000 Units by mouth 3 (three) times a week.     clobetasol  ointment (TEMOVATE ) 0.05 % Apply 1 Application topically daily.     Continuous Glucose Receiver (FREESTYLE LIBRE 3 READER) DEVI 1 Act by Does not apply route daily. 1 each 3   Continuous Glucose Sensor (FREESTYLE LIBRE 3 PLUS SENSOR) MISC Change sensor every 15 days. 2 each 11   diltiazem  (CARDIZEM ) 60 MG tablet Take 1 tablet (60 mg total) by mouth 2 (two) times daily as needed for up to 1 dose (Take 1 tab as needed for SVT twice daily). 30 tablet 0   gabapentin  (NEURONTIN ) 600 MG tablet Take 600 mg by mouth 2 (two) times daily.     Glucagon  (GVOKE HYPOPEN  2-PACK) 1 MG/0.2ML SOAJ Inject 1 Act into the skin daily as needed. 2 mL 5   HUMALOG  KWIKPEN 100 UNIT/ML KwikPen INJECT SUBCUTANEOUSLY 16 UNITS 3 TIMES DAILY 60 mL 2   Insulin  Pen Needle (UNIFINE PENTIPS PLUS) 33G X 4 MM MISC Inject 1 Act into the skin 4 (four) times daily. 400 each 1   ketoconazole (NIZORAL) 2 % cream Apply 1 Application topically daily as needed for irritation.     Lancets (ONETOUCH DELICA PLUS LANCET33G) MISC USE AS DIRECTED TO CHECK BLOOD SUGAR 4 TIMES DAILY AFTER MEALS AND AT BEDTIME. 200 each 4   MOUNJARO 10 MG/0.5ML Pen Inject 10 mg into the skin once  a week.     naproxen sodium (ALEVE) 220 MG tablet Take 220 mg by mouth daily as needed (pain).     nystatin  ointment (MYCOSTATIN ) Apply 1 Application topically 2 (two) times daily as needed.     omeprazole (PRILOSEC) 20 MG capsule Take 20 mg by mouth daily.     ONETOUCH VERIO test strip CHECK BLOOD SUGAR 4 TIMES A DAY. 200 strip 5   Polyvinyl Alcohol-Povidone (REFRESH OP) Place 1 drop into both eyes daily as needed (dry eyes).     rosuvastatin  (CRESTOR ) 20 MG tablet TAKE 1 TABLET BY MOUTH DAILY 100 tablet 2   Simethicone  125 MG  CAPS Take 1 capsule by mouth daily as needed.     traZODone  (DESYREL ) 50 MG tablet Take 50-150 mg by mouth at bedtime.     TRINTELLIX  20 MG TABS tablet Take 20 mg by mouth daily.     UNITHROID  50 MCG tablet TAKE 1 TABLET BY MOUTH DAILY  BEFORE BREAKFAST 100 tablet 2   zaleplon (SONATA) 10 MG capsule Take 10 mg by mouth at bedtime.     ipratropium (ATROVENT  HFA) 17 MCG/ACT inhaler USE 2 PUFFS EVERY 4 HOURS  AS NEEDED FOR WHEEZING. 12.9 g 3   dicyclomine  (BENTYL ) 10 MG capsule TAKE 1 CAPSULE BY MOUTH 3 TIMES  DAILY BEFORE MEALS (Patient not taking: Reported on 12/10/2024) 270 capsule 0   Insulin  Glargine (TOUJEO  SOLOSTAR [place]) Inject 20 Units into the skin daily. (Patient not taking: Reported on 12/10/2024)     loperamide (IMODIUM) 2 MG capsule Take 2 mg by mouth as needed for diarrhea or loose stools. (Patient not taking: Reported on 12/10/2024)     No facility-administered medications prior to visit.    ROS Review of Systems  Constitutional:  Negative for appetite change, chills, diaphoresis, fatigue and fever.  HENT: Negative.    Respiratory:  Positive for wheezing. Negative for cough, choking, chest tightness, shortness of breath and stridor.   Cardiovascular:  Negative for chest pain, palpitations and leg swelling.  Gastrointestinal: Negative.  Negative for abdominal pain, constipation, diarrhea, nausea and vomiting.  Endocrine: Negative.   Genitourinary: Negative.  Negative for difficulty urinating.  Musculoskeletal:  Negative for arthralgias, joint swelling and myalgias.  Skin: Negative.   Neurological:  Negative for dizziness and weakness.  Hematological:  Negative for adenopathy. Does not bruise/bleed easily.  Psychiatric/Behavioral: Negative.      Objective:  BP 124/88 (BP Location: Right Arm, Patient Position: Sitting, Cuff Size: Large)   Pulse 89   Temp 98.2 F (36.8 C) (Oral)   Resp 16   Ht 5' (1.524 m)   Wt 188 lb 12.8 oz (85.6 kg)   LMP 12/31/1993   SpO2 95%   BMI  36.87 kg/m   BP Readings from Last 3 Encounters:  12/29/24 124/88  12/15/24 104/64  10/23/24 101/70    Wt Readings from Last 3 Encounters:  12/29/24 188 lb 12.8 oz (85.6 kg)  12/15/24 194 lb (88 kg)  10/23/24 195 lb (88.5 kg)    Physical Exam Vitals reviewed.  Constitutional:      General: She is not in acute distress.    Appearance: She is not toxic-appearing or diaphoretic.  HENT:     Nose: Nose normal.     Mouth/Throat:     Mouth: Mucous membranes are moist.  Eyes:     General: No scleral icterus.    Conjunctiva/sclera: Conjunctivae normal.  Cardiovascular:     Rate and Rhythm: Normal rate and regular  rhythm.     Heart sounds: No murmur heard.    No friction rub. No gallop.  Pulmonary:     Effort: Pulmonary effort is normal.     Breath sounds: No stridor. No wheezing, rhonchi or rales.  Abdominal:     General: Abdomen is protuberant. Bowel sounds are normal. There is no distension.     Palpations: Abdomen is soft. There is no hepatomegaly, splenomegaly or mass.     Tenderness: There is no abdominal tenderness. There is no guarding.  Musculoskeletal:        General: Normal range of motion.     Cervical back: Neck supple.     Right lower leg: No edema.     Left lower leg: No edema.  Lymphadenopathy:     Cervical: No cervical adenopathy.  Skin:    General: Skin is warm and dry.     Findings: No rash.  Neurological:     General: No focal deficit present.     Mental Status: She is alert.  Psychiatric:        Mood and Affect: Mood normal.        Behavior: Behavior normal.     Lab Results  Component Value Date   WBC 10.5 12/04/2024   HGB 13.3 12/04/2024   HCT 42.1 12/04/2024   PLT 212 12/04/2024   GLUCOSE 131 (H) 12/04/2024   CHOL 104 12/29/2024   TRIG 150.0 (H) 12/29/2024   HDL 43.20 12/29/2024   LDLDIRECT 70.0 07/30/2022   LDLCALC 31 12/29/2024   ALT 9 12/29/2024   AST 16 12/29/2024   NA 140 12/04/2024   K 4.0 12/04/2024   CL 101 12/04/2024    CREATININE 1.33 (H) 12/04/2024   BUN 11 12/04/2024   CO2 24 12/04/2024   TSH 0.91 12/11/2024   INR 1.1 (H) 12/29/2024   HGBA1C 5.8 12/11/2024   MICROALBUR 0.9 03/30/2024    EP STUDY Result Date: 12/15/2024 SURGEON: Eulas FORBES Furbish, MD PREPROCEDURE DIAGNOSIS: SVT POSTPROCEDURE DIAGNOSIS: AVNRT, Atrial flutter PROCEDURES: 1. Comprehensive EP study. 2. Coronary sinus pacing and recording. 3. 3D Mapping of supraventricular tachycardia. 4. Radiofrequency ablation of supraventricular tachycardia. 5. Isuprel  infusion for arrhythmia induction and maintenance INTRODUCTION: Riely Oetken is a 74 y.o. female with a history of symptomatic recurrent SVT who presents today for EP study and radiofrequency ablation. DESCRIPTION OF PROCEDURE: Informed written consent was obtained and the patient was brought to the Electrophysiology Lab in the fasting state. The patient was adequately sedated with intravenous medication as outlined in the anesthesia report. The patient was prepped and draped in the usual sterile fashion. The inguinal areas were anaesthetized using lidocaine . Using ultraound guidance and modified seldinger technique the right femoral vein was accessed x 3. One 7-French and two 8-French sheaths were placed into the right common femoral vein. A Deca polar catheter was advanced to the RA and an electroanatomic map was created of the RA/RV and CS. This catheter was then positioned in the coronary sinus for atrial pacing and recording. Next, a Josephson catheter was positioned at the His bundle for pacing and recording. A Josephson catheter was then advanced to the RV apex for pacing and recording.  Presenting Measurements: The presenting rhythm was sinus rhythm. The PR interval was 159 msec with a QRS of 103 msec and a QT of 463 msec. The average RR interval was 1038 msec. The AH interval was 82 msec and the HV interval was 56 milliseconds.  There was no pre-excitation on  surface ECG. EP study:  Ventricular pacing was performed which reveals concentric and decremental VA conduction. The VA WCL was dissociated at a pacing interval of 800 ms msec. Decremental atrial pacing revealed a WCL of 410 ms. There was evidence of antegrade dual AV nodal physiology with decremental atrial pacing with PR greater than RR.  As we came off pacing, the patient converted to a narrow complex tachycardia with a cycle length of 400 ms.  Septal VA time was less than 70 ms.  Even though the patient did have VA dissociation at baseline, we performed VA overdrive pacing which resulted in termination of the tachycardia. Atrial estrastimulus testing was then performed revealing AV node ERP of 600:200.  We did not observe a discrete jump though AV conduction was quite long, and single echo beats were noted.  AVN ERP was 600: 200 ms. Isuprel  was then started at 2 mcg/min with an observed effect on the sinus CL. On isoproterenol , the AV node ERP was less than 500 200.  We did not observe a slow pathway jump. Isoproterenol  was discontinued, and we performed an additional EP study as the medication was wearing off.  With burst pacing at a cycle length of 280 ms, we induced tachycardia with a cycle length of approximately 200 ms with 2-1 conduction to the ventricle.  Coronary sinus activation was proximal to distal.  We exchanged the HIS catheter for a 4 mm ST SF ablation catheter and performed a quick electroanatomic map.  Right atrial activation was consistent with a counterclockwise CTI dependent flutter. Ablation: An irrigated ablation catheter was then advanced to the RA. The His bundle and CS os were annotated on the electroanatomic map.  The ablation catheter was advanced to the cavotricuspid isthmus and a series of lesions were delivered from the tricuspid annulus extending to the inferior vena cava.  Prior to ablation, the CTI transit time was approximately 70 ms.  After ablation, CTI transit time was 140 ms.  Several additional  lesions were required to achieve block.  Ablation lesions were delivered at up to 40 W. Additional mapping, SVT: After ablation of the CTI line, after decided to perform additional mapping of the slow pathway area.  The hiss was annotated previously and noted to be fairly low, at approximately 3:00 on the coronary sinus ostium.  While mapping below this, the patient spontaneously converted to supraventricular tachycardia with a cycle length of 390 ms.  Again coronary sinus catheter activation was proximal to distal.  The VA septal button time was less than 70 ms.  RV overdrive pacing was again performed resulting in a PPI minus TCL of greater than 150 ms.  I decided to perform an activation map of this rhythm, and the earliest activation was located at the region of the fast pathway of the AV node, consistent with AVNRT.  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 The SVT was paced terminated with atrial pacing at a cycle length of 360 ms. Ablation was performed at the inferior/ventricular aspect of the [place] where a slow pathway potential was observed. With ablation, an accelerated junctional rhythm was observed. During accelerated junction rhythm with intact atrial and ventricular conduction. Several reinforcing lesions were delivered to this area. Measurements following ablation: Following ablation, repeat EP study was performed. With atrial pacing, AV Wenke block cycle length was 370 ms.  Atrial ERP was 600: 230.. With ventricular pacing, there was no VA conduction. Isuprel  was again  infused at 2 mcg/min with an adequate acceleration in heart rate response. EP study again confirmed normal AV node function without an inducible tachycardia.  There were no echo beats noted. Final intervals were: PR 131 ms, QRS 94 ms, QT 394 ms, AH 137 ms, HV 45 ms. The procedure was considered complete. All catheters were removed from the body. Sheaths were removed and hemostasis  achieved with Mynx devices and manual pressure. EBL<66ml. There were no early apparent complications. CONCLUSIONS: 1. Sinus rhythm upon presentation. 2.  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 4. Successful radiofrequency ablation of the CTI for typical atrial flutter 5.  Successful ablation of the slow pathway of the AV node for AVNRT 6. No inducible arrhythmias following ablation. 7. No early apparent complications. Eulas FORBES Furbish, MD 12/15/2024 4:17 PM    Fibrosis 4 Score = 1.81  Fib-4 interpretation is not validated for people under 35 or over 66 years of age. However, scores under 2.0 are generally considered low risk.   Assessment & Plan:   Type 2 diabetes mellitus with stage 3b chronic kidney disease, without long-term current use of insulin  (HCC)- Will start an SGLT2I  for renal protection. -     Empagliflozin ; Take 1 tablet (10 mg total) by mouth daily before breakfast.  Dispense: 90 tablet; Refill: 1  Simple chronic bronchitis (HCC) -     Atrovent  HFA; USE 2 PUFFS EVERY 4 HOURS  AS NEEDED FOR WHEEZING.  Dispense: 12.9 g; Refill: 3  Fatty liver disease, nonalcoholic -     Protime-INR; Future -     Hepatic function panel; Future  Hyperlipidemia LDL goal <130- LDL goal achieved. Doing well on the statin  -     Lipid panel; Future -     Hepatic function panel; Future  Encounter for general adult medical examination with abnormal findings- Exam completed, labs reviewed, vaccines reviewed, cancer screenings are UTD, pt ed material was given.      Follow-up: Return in about 6 months (around 06/29/2025).  Debby Molt, MD "

## 2024-12-29 NOTE — Patient Instructions (Signed)

## 2024-12-30 ENCOUNTER — Ambulatory Visit: Payer: Self-pay | Admitting: Internal Medicine

## 2025-01-15 ENCOUNTER — Ambulatory Visit: Admitting: Pulmonary Disease

## 2025-01-21 ENCOUNTER — Other Ambulatory Visit: Payer: Self-pay | Admitting: Internal Medicine

## 2025-01-21 DIAGNOSIS — E119 Type 2 diabetes mellitus without complications: Secondary | ICD-10-CM

## 2025-02-11 ENCOUNTER — Ambulatory Visit: Admitting: Student

## 2025-02-23 ENCOUNTER — Ambulatory Visit: Admitting: Physician Assistant

## 2025-06-21 ENCOUNTER — Ambulatory Visit
